# Patient Record
Sex: Male | Born: 1965 | Race: Black or African American | Hispanic: No | Marital: Married | Smoking: Former smoker
Health system: Southern US, Community
[De-identification: ages and names within clinical notes are randomized; demographics above are authoritative.]

## PROBLEM LIST (undated history)

## (undated) DIAGNOSIS — M549 Dorsalgia, unspecified: Secondary | ICD-10-CM

## (undated) DIAGNOSIS — R112 Nausea with vomiting, unspecified: Secondary | ICD-10-CM

## (undated) DIAGNOSIS — A09 Infectious gastroenteritis and colitis, unspecified: Secondary | ICD-10-CM

## (undated) DIAGNOSIS — B20 Human immunodeficiency virus [HIV] disease: Secondary | ICD-10-CM

## (undated) DIAGNOSIS — R197 Diarrhea, unspecified: Secondary | ICD-10-CM

## (undated) DIAGNOSIS — K648 Other hemorrhoids: Secondary | ICD-10-CM

## (undated) DIAGNOSIS — N3010 Interstitial cystitis (chronic) without hematuria: Secondary | ICD-10-CM

## (undated) DIAGNOSIS — K219 Gastro-esophageal reflux disease without esophagitis: Secondary | ICD-10-CM

## (undated) DIAGNOSIS — K589 Irritable bowel syndrome without diarrhea: Secondary | ICD-10-CM

## (undated) DIAGNOSIS — I700 Atherosclerosis of aorta: Secondary | ICD-10-CM

## (undated) DIAGNOSIS — K922 Gastrointestinal hemorrhage, unspecified: Secondary | ICD-10-CM

## (undated) DIAGNOSIS — K5900 Constipation, unspecified: Secondary | ICD-10-CM

## (undated) DIAGNOSIS — K8020 Calculus of gallbladder without cholecystitis without obstruction: Secondary | ICD-10-CM

## (undated) DIAGNOSIS — K5790 Diverticulosis of intestine, part unspecified, without perforation or abscess without bleeding: Secondary | ICD-10-CM

## (undated) DIAGNOSIS — M8580 Other specified disorders of bone density and structure, unspecified site: Secondary | ICD-10-CM

## (undated) DIAGNOSIS — M1990 Unspecified osteoarthritis, unspecified site: Secondary | ICD-10-CM

## (undated) DIAGNOSIS — A0471 Enterocolitis due to Clostridium difficile, recurrent: Secondary | ICD-10-CM

## (undated) DIAGNOSIS — F32A Depression, unspecified: Secondary | ICD-10-CM

## (undated) DIAGNOSIS — I10 Essential (primary) hypertension: Secondary | ICD-10-CM

## (undated) DIAGNOSIS — L989 Disorder of the skin and subcutaneous tissue, unspecified: Secondary | ICD-10-CM

## (undated) DIAGNOSIS — R109 Unspecified abdominal pain: Secondary | ICD-10-CM

## (undated) DIAGNOSIS — K31819 Angiodysplasia of stomach and duodenum without bleeding: Secondary | ICD-10-CM

## (undated) DIAGNOSIS — R634 Abnormal weight loss: Secondary | ICD-10-CM

## (undated) DIAGNOSIS — F329 Major depressive disorder, single episode, unspecified: Secondary | ICD-10-CM

## (undated) DIAGNOSIS — I639 Cerebral infarction, unspecified: Secondary | ICD-10-CM

## (undated) DIAGNOSIS — Z21 Asymptomatic human immunodeficiency virus [HIV] infection status: Secondary | ICD-10-CM

## (undated) DIAGNOSIS — K859 Acute pancreatitis without necrosis or infection, unspecified: Secondary | ICD-10-CM

## (undated) DIAGNOSIS — D649 Anemia, unspecified: Secondary | ICD-10-CM

## (undated) DIAGNOSIS — Z952 Presence of prosthetic heart valve: Secondary | ICD-10-CM

## (undated) HISTORY — DX: Infectious gastroenteritis and colitis, unspecified: A09

## (undated) HISTORY — DX: Nausea with vomiting, unspecified: R11.2

## (undated) HISTORY — DX: Disorder of the skin and subcutaneous tissue, unspecified: L98.9

## (undated) HISTORY — DX: Unspecified abdominal pain: R10.9

## (undated) HISTORY — DX: Angiodysplasia of stomach and duodenum without bleeding: K31.819

## (undated) HISTORY — PX: AORTIC VALVE REPLACEMENT: SHX41

## (undated) HISTORY — DX: Diarrhea, unspecified: R19.7

## (undated) HISTORY — DX: Other hemorrhoids: K64.8

## (undated) HISTORY — DX: Enterocolitis due to Clostridium difficile, recurrent: A04.71

## (undated) HISTORY — DX: Interstitial cystitis (chronic) without hematuria: N30.10

## (undated) HISTORY — DX: Atherosclerosis of aorta: I70.0

## (undated) HISTORY — DX: Calculus of gallbladder without cholecystitis without obstruction: K80.20

## (undated) HISTORY — PX: COLONOSCOPY WITH ESOPHAGOGASTRODUODENOSCOPY (EGD): SHX5779

## (undated) HISTORY — PX: KNEE SURGERY: SHX244

## (undated) HISTORY — DX: Gastrointestinal hemorrhage, unspecified: K92.2

## (undated) HISTORY — DX: Abnormal weight loss: R63.4

## (undated) HISTORY — PX: CARDIAC SURGERY: SHX584

## (undated) HISTORY — DX: Irritable bowel syndrome, unspecified: K58.9

## (undated) HISTORY — DX: Anemia, unspecified: D64.9

## (undated) HISTORY — DX: Other specified disorders of bone density and structure, unspecified site: M85.80

## (undated) HISTORY — DX: Constipation, unspecified: K59.00

## (undated) HISTORY — DX: Gastro-esophageal reflux disease without esophagitis: K21.9

## (undated) HISTORY — DX: Diverticulosis of intestine, part unspecified, without perforation or abscess without bleeding: K57.90

## (undated) HISTORY — DX: Dorsalgia, unspecified: M54.9

---

## 2010-09-28 ENCOUNTER — Emergency Department (HOSPITAL_COMMUNITY): Payer: Medicaid Other

## 2010-09-28 ENCOUNTER — Emergency Department (HOSPITAL_COMMUNITY)
Admission: EM | Admit: 2010-09-28 | Discharge: 2010-09-28 | Disposition: A | Discharge status: Home / Self Care | Payer: Medicaid Other | Attending: Emergency Medicine | Admitting: Emergency Medicine

## 2010-09-28 DIAGNOSIS — Z21 Asymptomatic human immunodeficiency virus [HIV] infection status: Secondary | ICD-10-CM | POA: Insufficient documentation

## 2010-09-28 DIAGNOSIS — Z954 Presence of other heart-valve replacement: Secondary | ICD-10-CM | POA: Insufficient documentation

## 2010-09-28 DIAGNOSIS — R112 Nausea with vomiting, unspecified: Secondary | ICD-10-CM | POA: Insufficient documentation

## 2010-09-28 DIAGNOSIS — R109 Unspecified abdominal pain: Secondary | ICD-10-CM | POA: Insufficient documentation

## 2010-09-28 LAB — LIPASE, BLOOD: Lipase: 19 U/L (ref 11–59)

## 2010-09-28 LAB — COMPREHENSIVE METABOLIC PANEL
BUN: 8 mg/dL (ref 6–23)
CO2: 26 mEq/L (ref 19–32)
Calcium: 9.4 mg/dL (ref 8.4–10.5)
Chloride: 104 mEq/L (ref 96–112)
Creatinine, Ser: 1.01 mg/dL (ref 0.50–1.35)
GFR calc Af Amer: >60 mL/min (ref >60)
GFR calc non Af Amer: >60 mL/min (ref >60)
Glucose, Bld: 148 mg/dL — ABNORMAL HIGH (ref 70–99)
Total Bilirubin: 0.4 mg/dL (ref 0.3–1.2)

## 2010-11-10 ENCOUNTER — Emergency Department (HOSPITAL_COMMUNITY): Payer: Medicaid Other

## 2010-11-10 ENCOUNTER — Emergency Department (HOSPITAL_COMMUNITY)
Admission: EM | Admit: 2010-11-10 | Discharge: 2010-11-11 | Disposition: A | Discharge status: Home / Self Care | Payer: Medicaid Other | Attending: Emergency Medicine | Admitting: Emergency Medicine

## 2010-11-10 DIAGNOSIS — Z8673 Personal history of transient ischemic attack (TIA), and cerebral infarction without residual deficits: Secondary | ICD-10-CM | POA: Insufficient documentation

## 2010-11-10 DIAGNOSIS — Z21 Asymptomatic human immunodeficiency virus [HIV] infection status: Secondary | ICD-10-CM | POA: Insufficient documentation

## 2010-11-10 DIAGNOSIS — R10819 Abdominal tenderness, unspecified site: Secondary | ICD-10-CM | POA: Insufficient documentation

## 2010-11-10 DIAGNOSIS — R109 Unspecified abdominal pain: Secondary | ICD-10-CM | POA: Insufficient documentation

## 2010-11-10 DIAGNOSIS — R112 Nausea with vomiting, unspecified: Secondary | ICD-10-CM | POA: Insufficient documentation

## 2010-11-10 DIAGNOSIS — R197 Diarrhea, unspecified: Secondary | ICD-10-CM | POA: Insufficient documentation

## 2010-11-10 LAB — COMPREHENSIVE METABOLIC PANEL
ALT: 23 U/L (ref 0–53)
AST: 28 U/L (ref 0–37)
Alkaline Phosphatase: 94 U/L (ref 39–117)
CO2: 22 mEq/L (ref 19–32)
Calcium: 8.4 mg/dL (ref 8.4–10.5)
GFR calc non Af Amer: >60 mL/min (ref >60)
Potassium: 3.7 mEq/L (ref 3.5–5.1)
Sodium: 141 mEq/L (ref 135–145)
Total Protein: 7 g/dL (ref 6.0–8.3)

## 2010-11-10 LAB — CBC
MCH: 33.1 pg (ref 26.0–34.0)
Platelets: 138 10*3/uL — ABNORMAL LOW (ref 150–400)
RBC: 4.14 MIL/uL — ABNORMAL LOW (ref 4.22–5.81)

## 2010-11-10 LAB — DIFFERENTIAL
Basophils Absolute: 0 10*3/uL (ref 0.0–0.1)
Basophils Relative: 0 % (ref 0–1)
Eosinophils Absolute: 0 10*3/uL (ref 0.0–0.7)
Monocytes Relative: 6 % (ref 3–12)
Neutro Abs: 3.3 10*3/uL (ref 1.7–7.7)
Neutrophils Relative %: 82 % — ABNORMAL HIGH (ref 43–77)

## 2010-11-11 LAB — URINALYSIS, ROUTINE W REFLEX MICROSCOPIC
Glucose, UA: NEGATIVE mg/dL
Hgb urine dipstick: NEGATIVE
Specific Gravity, Urine: 1.02 (ref 1.005–1.030)

## 2011-04-11 ENCOUNTER — Emergency Department (HOSPITAL_COMMUNITY)
Admission: EM | Admit: 2011-04-11 | Discharge: 2011-04-11 | Disposition: A | Discharge status: Home / Self Care | Payer: Medicaid Other | Attending: Emergency Medicine | Admitting: Emergency Medicine

## 2011-04-11 ENCOUNTER — Encounter (HOSPITAL_COMMUNITY): Payer: Self-pay | Admitting: Emergency Medicine

## 2011-04-11 DIAGNOSIS — Z21 Asymptomatic human immunodeficiency virus [HIV] infection status: Secondary | ICD-10-CM | POA: Insufficient documentation

## 2011-04-11 DIAGNOSIS — F17200 Nicotine dependence, unspecified, uncomplicated: Secondary | ICD-10-CM | POA: Insufficient documentation

## 2011-04-11 DIAGNOSIS — R112 Nausea with vomiting, unspecified: Secondary | ICD-10-CM | POA: Insufficient documentation

## 2011-04-11 DIAGNOSIS — Z8673 Personal history of transient ischemic attack (TIA), and cerebral infarction without residual deficits: Secondary | ICD-10-CM | POA: Insufficient documentation

## 2011-04-11 DIAGNOSIS — R197 Diarrhea, unspecified: Secondary | ICD-10-CM | POA: Insufficient documentation

## 2011-04-11 DIAGNOSIS — R1013 Epigastric pain: Secondary | ICD-10-CM | POA: Insufficient documentation

## 2011-04-11 DIAGNOSIS — R10816 Epigastric abdominal tenderness: Secondary | ICD-10-CM | POA: Insufficient documentation

## 2011-04-11 DIAGNOSIS — I10 Essential (primary) hypertension: Secondary | ICD-10-CM | POA: Insufficient documentation

## 2011-04-11 HISTORY — DX: Cerebral infarction, unspecified: I63.9

## 2011-04-11 HISTORY — DX: Essential (primary) hypertension: I10

## 2011-04-11 HISTORY — DX: Asymptomatic human immunodeficiency virus (hiv) infection status: Z21

## 2011-04-11 HISTORY — DX: Human immunodeficiency virus (HIV) disease: B20

## 2011-04-11 LAB — URINALYSIS, ROUTINE W REFLEX MICROSCOPIC
Hgb urine dipstick: NEGATIVE
Protein, ur: NEGATIVE mg/dL
Urobilinogen, UA: 0.2 mg/dL (ref 0.0–1.0)

## 2011-04-11 LAB — COMPREHENSIVE METABOLIC PANEL
ALT: 33 U/L (ref 0–53)
CO2: 24 mEq/L (ref 19–32)
Calcium: 10 mg/dL (ref 8.4–10.5)
Creatinine, Ser: 0.93 mg/dL (ref 0.50–1.35)
GFR calc Af Amer: >90 mL/min (ref >90)
GFR calc non Af Amer: >90 mL/min (ref >90)
Glucose, Bld: 175 mg/dL — ABNORMAL HIGH (ref 70–99)

## 2011-04-11 LAB — DIFFERENTIAL
Eosinophils Relative: 0 % (ref 0–5)
Lymphocytes Relative: 23 % (ref 12–46)
Lymphs Abs: 0.9 10*3/uL (ref 0.7–4.0)
Monocytes Absolute: 0.2 10*3/uL (ref 0.1–1.0)
Monocytes Relative: 6 % (ref 3–12)

## 2011-04-11 LAB — CBC
HCT: 43.1 % (ref 39.0–52.0)
MCV: 92.3 fL (ref 78.0–100.0)
RBC: 4.67 MIL/uL (ref 4.22–5.81)
WBC: 3.7 10*3/uL — ABNORMAL LOW (ref 4.0–10.5)

## 2011-04-11 LAB — PROTIME-INR: Prothrombin Time: 21 seconds — ABNORMAL HIGH (ref 11.6–15.2)

## 2011-04-11 MED ORDER — SODIUM CHLORIDE 0.9 % IV BOLUS (SEPSIS)
1000.0000 mL | Freq: Once | INTRAVENOUS | Status: AC
  Administered 2011-04-11: 1000 mL via INTRAVENOUS

## 2011-04-11 MED ORDER — HYDROMORPHONE HCL PF 1 MG/ML IJ SOLN
1.0000 mg | Freq: Once | INTRAMUSCULAR | Status: AC
  Administered 2011-04-11: 1 mg via INTRAVENOUS
  Filled 2011-04-11: qty 1

## 2011-04-11 MED ORDER — ONDANSETRON HCL 4 MG/2ML IJ SOLN
4.0000 mg | Freq: Once | INTRAMUSCULAR | Status: AC
  Administered 2011-04-11: 4 mg via INTRAVENOUS
  Filled 2011-04-11: qty 2

## 2011-04-11 NOTE — ED Provider Notes (Signed)
Pt with chronic abdominal pain issues.  States he has had this off and on for 10 years.  He is seen regularly at [facility].  Pt without signs of acute abnormality on his lab tests today.  No sign of significant dehydration.  Pt feeling better after treatment.  Doubt pancreatitis, biliary etiology, appendicitis, bowel obstruction.  I saw and evaluated the patient, reviewed the resident's note and I agree with the findings and plan.   Celene Kras, MD 04/11/11 [phone]

## 2011-04-11 NOTE — ED Notes (Signed)
Pt c/o of rt eye swelling, no swelling noted. PA made aware

## 2011-04-11 NOTE — ED Notes (Signed)
Asked pt for urine sample and pt stated that he did not have to go right now. Urinal and visitor at bedside, call bell within reach.

## 2011-04-11 NOTE — ED Provider Notes (Signed)
1:55 PM Patient is in CDU holding for symptomatic improvement of abdominal pain, N/V/D.  Patient reports he is starting to improve, pain is now 6/10.  On exam, pt is A&Ox4, NAD, RRR, mechanical valve, CTAB, abd nondistended, soft, diffusely tender, voluntary guarding, no rebound.  Patient requests that we check PT/INR as he missed his appointment last week and is s/p stroke and valve replacement.  I have discussed with with Dr Roselyn Bering who agrees with this.  Will continue to follow.    2:47 PM Patient reports his pain is now 4/10.  States he would like to try to drink something but also requests more nausea medication.  I discussed his INR result with him (1.7) - patient states he has instructions already on how to adjust his coumadin dose.  I have ordered zofran and PO trial.   UA also pending.  Patient discussed with Felicie Morn, NP, who assumes care of patient at change of shift.    Brandon [name], [place] 04/11/11 [phone]

## 2011-04-11 NOTE — ED Provider Notes (Signed)
History     CSN: [id number]  Arrival date & time 04/11/11  1012   First MD Initiated Contact with Patient 04/11/11 1015      Chief Complaint  Patient presents with  . Emesis    (Consider location/radiation/quality/duration/timing/severity/associated sxs/prior treatment) Patient is a 46 y.o. male presenting with vomiting and abdominal pain.  Emesis  This is a new problem. The current episode started 6 to 12 hours ago. Episode frequency: multiple times. The problem has not changed since onset.The emesis has an appearance of stomach contents. There has been no fever. Associated symptoms include abdominal pain and diarrhea (at baseline). Pertinent negatives include no cough and no fever.  Abdominal Pain The primary symptoms of the illness include abdominal pain, vomiting and diarrhea (at baseline). The primary symptoms of the illness do not include fever, shortness of breath or nausea. The current episode started 6 to 12 hours ago. The onset of the illness was gradual. The problem has not changed since onset. The abdominal pain is located in the epigastric region (squeezing pain). The severity of the abdominal pain is 10/10. The abdominal pain is relieved by nothing. The abdominal pain is exacerbated by movement and certain positions.    Past Medical History  Diagnosis Date  . HIV (human immunodeficiency virus infection)   . Hypertension   . Stroke     Past Surgical History  Procedure Date  . Cardiac surgery     History reviewed. No pertinent family history.  History  Substance Use Topics  . Smoking status: Current Everyday Smoker  . Smokeless tobacco: Not on file  . Alcohol Use: Yes     12oz beer/day      Review of Systems  Constitutional: Negative for fever.  HENT: Negative for congestion, facial swelling and trouble swallowing.   Respiratory: Negative for cough and shortness of breath.   Cardiovascular: Negative for chest pain.  Gastrointestinal: Positive for  vomiting, abdominal pain and diarrhea (at baseline). Negative for nausea.  Genitourinary: Negative for difficulty urinating.  Skin: Negative for rash.  All other systems reviewed and are negative.    Allergies  Sulfa antibiotics and Truvada  Home Medications  No current outpatient prescriptions on file.  BP 154/102  Temp 97.3 F (36.3 C)  Resp 14  SpO2 98%  Physical Exam  Nursing note and vitals reviewed. Constitutional: He is oriented to person, place, and time. He appears well-developed and well-nourished. No distress.  HENT:  Head: Normocephalic and atraumatic.  Mouth/Throat: Oropharynx is clear and moist.  Eyes: Conjunctivae are normal. Pupils are equal, round, and reactive to light. No scleral icterus.  Neck: Normal range of motion. Neck supple.  Cardiovascular: Normal rate, regular rhythm and intact distal pulses.   No murmur heard.      Mechanical click   Pulmonary/Chest: Effort normal and breath sounds normal. No stridor. No respiratory distress. He has no wheezes. He has no rales.  Abdominal: Soft. He exhibits no distension. There is tenderness in the epigastric area. There is guarding (voluntary upon palpation of epigastrum).  Musculoskeletal: Normal range of motion. He exhibits no edema.  Neurological: He is alert and oriented to person, place, and time.  Skin: Skin is warm and dry. No rash noted.  Psychiatric: He has a normal mood and affect. His behavior is normal.    ED Course  Procedures (including critical care time)  Labs Reviewed  CBC - Abnormal; Notable for the following:    WBC 3.7 (*)    Platelets 128 (*)  All other components within normal limits  COMPREHENSIVE METABOLIC PANEL - Abnormal; Notable for the following:    Glucose, Bld 175 (*)    AST 43 (*)    Alkaline Phosphatase 152 (*)    All other components within normal limits  DIFFERENTIAL  LIPASE, BLOOD  URINALYSIS, ROUTINE W REFLEX MICROSCOPIC   No results found.   1. Abdominal  pain       MDM  46 yo male with onset of epigastric pain and vomiting starting at 3 am this morning.  Reported history of pancreatitis.  No fevers.  Tenderness in epigastrum, but otherwise abdomen soft and nontender.  States he has had presentations similar to this at [facility] in [place].  Do not think he needs abdominal imaging.    Labwork unremarkable, Lipase normal.  Pain and nausea improved after Dilaudid/Zofran IV.  1L IVF given.  Patient requested second L.  Transferred to CDU for additional fluids.   Anticipate DC home after additional fluids.          Warnell Forester, MD 04/11/11 1357

## 2011-04-11 NOTE — ED Provider Notes (Signed)
Patient feeling better after IV fluids and medications.  He is scheduled for an appointment with his PCP on 04/27/11.  Patient has adequate pain and antinausea medication at home.  Jimmye Norman, NP 04/11/11 1728

## 2011-04-11 NOTE — ED Notes (Signed)
Pt given ice chips per request

## 2011-04-11 NOTE — ED Notes (Signed)
Pt with nausea, vomiting and squeezing stomach pains since 0300.

## 2011-04-11 NOTE — ED Notes (Signed)
Pt given ICE chips and gingerale

## 2011-04-12 ENCOUNTER — Encounter (HOSPITAL_COMMUNITY): Payer: Self-pay | Admitting: Emergency Medicine

## 2011-04-12 ENCOUNTER — Other Ambulatory Visit: Payer: Self-pay

## 2011-04-12 ENCOUNTER — Emergency Department (HOSPITAL_COMMUNITY): Payer: Medicaid Other

## 2011-04-12 ENCOUNTER — Emergency Department (HOSPITAL_COMMUNITY)
Admission: EM | Admit: 2011-04-12 | Discharge: 2011-04-13 | Disposition: A | Discharge status: Home / Self Care | Payer: Medicaid Other | Attending: Emergency Medicine | Admitting: Emergency Medicine

## 2011-04-12 DIAGNOSIS — R109 Unspecified abdominal pain: Secondary | ICD-10-CM | POA: Insufficient documentation

## 2011-04-12 DIAGNOSIS — Z79899 Other long term (current) drug therapy: Secondary | ICD-10-CM | POA: Insufficient documentation

## 2011-04-12 DIAGNOSIS — R12 Heartburn: Secondary | ICD-10-CM | POA: Insufficient documentation

## 2011-04-12 DIAGNOSIS — M549 Dorsalgia, unspecified: Secondary | ICD-10-CM | POA: Insufficient documentation

## 2011-04-12 DIAGNOSIS — I10 Essential (primary) hypertension: Secondary | ICD-10-CM | POA: Insufficient documentation

## 2011-04-12 DIAGNOSIS — R112 Nausea with vomiting, unspecified: Secondary | ICD-10-CM | POA: Insufficient documentation

## 2011-04-12 DIAGNOSIS — Z9889 Other specified postprocedural states: Secondary | ICD-10-CM | POA: Insufficient documentation

## 2011-04-12 DIAGNOSIS — Z8673 Personal history of transient ischemic attack (TIA), and cerebral infarction without residual deficits: Secondary | ICD-10-CM | POA: Insufficient documentation

## 2011-04-12 DIAGNOSIS — R10819 Abdominal tenderness, unspecified site: Secondary | ICD-10-CM | POA: Insufficient documentation

## 2011-04-12 DIAGNOSIS — Z7901 Long term (current) use of anticoagulants: Secondary | ICD-10-CM | POA: Insufficient documentation

## 2011-04-12 DIAGNOSIS — R197 Diarrhea, unspecified: Secondary | ICD-10-CM | POA: Insufficient documentation

## 2011-04-12 DIAGNOSIS — Z21 Asymptomatic human immunodeficiency virus [HIV] infection status: Secondary | ICD-10-CM | POA: Insufficient documentation

## 2011-04-12 DIAGNOSIS — F17200 Nicotine dependence, unspecified, uncomplicated: Secondary | ICD-10-CM | POA: Insufficient documentation

## 2011-04-12 HISTORY — DX: Acute pancreatitis without necrosis or infection, unspecified: K85.90

## 2011-04-12 LAB — COMPREHENSIVE METABOLIC PANEL
ALT: 24 U/L (ref 0–53)
Alkaline Phosphatase: 137 U/L — ABNORMAL HIGH (ref 39–117)
CO2: 25 mEq/L (ref 19–32)
Chloride: 104 mEq/L (ref 96–112)
GFR calc Af Amer: >90 mL/min (ref >90)
GFR calc non Af Amer: >90 mL/min (ref >90)
Glucose, Bld: 134 mg/dL — ABNORMAL HIGH (ref 70–99)
Potassium: 3.8 mEq/L (ref 3.5–5.1)
Sodium: 138 mEq/L (ref 135–145)
Total Bilirubin: 0.6 mg/dL (ref 0.3–1.2)
Total Protein: 7.8 g/dL (ref 6.0–8.3)

## 2011-04-12 LAB — DIFFERENTIAL
Lymphocytes Relative: 20 % (ref 12–46)
Lymphs Abs: 0.7 10*3/uL (ref 0.7–4.0)
Monocytes Relative: 5 % (ref 3–12)
Neutro Abs: 2.7 10*3/uL (ref 1.7–7.7)
Neutrophils Relative %: 75 % (ref 43–77)

## 2011-04-12 LAB — CBC
Hemoglobin: 14.4 g/dL (ref 13.0–17.0)
Platelets: 130 10*3/uL — ABNORMAL LOW (ref 150–400)
RBC: 4.48 MIL/uL (ref 4.22–5.81)
WBC: 3.6 10*3/uL — ABNORMAL LOW (ref 4.0–10.5)

## 2011-04-12 MED ORDER — ONDANSETRON HCL 4 MG/2ML IJ SOLN
4.0000 mg | Freq: Once | INTRAMUSCULAR | Status: AC
  Administered 2011-04-12: 4 mg via INTRAVENOUS
  Filled 2011-04-12: qty 2

## 2011-04-12 MED ORDER — HYDROMORPHONE HCL PF 1 MG/ML IJ SOLN
1.0000 mg | Freq: Once | INTRAMUSCULAR | Status: AC
  Administered 2011-04-13: 1 mg via INTRAVENOUS
  Filled 2011-04-12: qty 1

## 2011-04-12 MED ORDER — IOHEXOL 300 MG/ML  SOLN: 20.0000 mL | INTRAMUSCULAR | Status: AC

## 2011-04-12 MED ORDER — HYDROMORPHONE HCL PF 1 MG/ML IJ SOLN
1.0000 mg | Freq: Once | INTRAMUSCULAR | Status: AC
  Administered 2011-04-12: 1 mg via INTRAVENOUS
  Filled 2011-04-12: qty 1

## 2011-04-12 MED ORDER — SODIUM CHLORIDE 0.9 % IV BOLUS (SEPSIS)
1000.0000 mL | Freq: Once | INTRAVENOUS | Status: AC
  Administered 2011-04-12: 1000 mL via INTRAVENOUS

## 2011-04-12 NOTE — ED Provider Notes (Signed)
History     CSN: [id number]  Arrival date & time 04/12/11  4098   First MD Initiated Contact with Patient 04/12/11 2008      Chief Complaint  Patient presents with  . Abdominal Pain    (Consider location/radiation/quality/duration/timing/severity/associated sxs/prior treatment) Patient is a 46 y.o. male presenting with abdominal pain.  Abdominal Pain The primary symptoms of the illness include abdominal pain, nausea, vomiting and diarrhea. The primary symptoms of the illness do not include fever or shortness of breath. Episode onset: yesterday. The onset of the illness was gradual. Progression since onset: improved after treatment yesterday, recurred today.  The abdominal pain began yesterday. The pain came on gradually. The abdominal pain has been gradually worsening since its onset. The abdominal pain is located in the epigastric region and periumbilical region. The abdominal pain radiates to the back. The severity of the abdominal pain is 10/10. Relieved by: IV narcotics and Zofran. The abdominal pain is exacerbated by vomiting (coffee).  The patient has had a change in bowel habit (has had diarrhea and hard stools today). Additional symptoms associated with the illness include heartburn. Significant associated medical issues include HIV.    Past Medical History  Diagnosis Date  . HIV (human immunodeficiency virus infection)   . Hypertension   . Stroke   . Pancreatitis     Past Surgical History  Procedure Date  . Cardiac surgery     No family history on file.  History  Substance Use Topics  . Smoking status: Current Everyday Smoker  . Smokeless tobacco: Not on file  . Alcohol Use: Yes     12oz beer/day      Review of Systems  Constitutional: Negative for fever.  HENT: Negative for congestion, facial swelling and trouble swallowing.   Respiratory: Negative for cough and shortness of breath.   Cardiovascular: Negative for chest pain.  Gastrointestinal: Positive for  heartburn, nausea, vomiting, abdominal pain and diarrhea.  Genitourinary: Negative for difficulty urinating.  All other systems reviewed and are negative.    Allergies  Bee venom; Sulfa antibiotics; and Truvada  Home Medications   Current Outpatient Rx  Name Route Sig Dispense Refill  . DAPSONE 100 MG PO TABS Oral Take 100 mg by mouth daily.    Marland Kitchen PREZISTA PO Oral Take 1 tablet by mouth daily.    Marland Kitchen EPIPEN IJ Injection Inject 1 application as directed once as needed. For sever allergic reaction    . DIFLUCAN PO Oral Take 1 tablet by mouth daily.    Marland Kitchen VALACYCLOVIR HCL 500 MG PO TABS Oral Take 500 mg by mouth daily.    . WARFARIN SODIUM 1 MG PO TABS Oral Take 1 mg by mouth daily.    . WARFARIN SODIUM 5 MG PO TABS Oral Take 5 mg by mouth daily.      BP 166/100  Pulse 83  Temp(Src) 98.2 F (36.8 C) (Oral)  Resp 18  SpO2 100%  Physical Exam  Nursing note and vitals reviewed. Constitutional: He is oriented to person, place, and time. He appears well-developed and well-nourished. No distress.  HENT:  Head: Normocephalic and atraumatic.  Mouth/Throat: Oropharynx is clear and moist.  Eyes: Conjunctivae are normal. Pupils are equal, round, and reactive to light. No scleral icterus.  Neck: Normal range of motion. Neck supple.  Cardiovascular: Normal rate, regular rhythm, normal heart sounds and intact distal pulses.   No murmur heard.      Mechanical click  Pulmonary/Chest: Effort normal and breath sounds  normal. No stridor. No respiratory distress. He has no wheezes. He has no rales.  Abdominal: Soft. He exhibits no distension. There is tenderness (epigastric and periumbilical). There is guarding (voluntary).  Musculoskeletal: Normal range of motion. He exhibits no edema.  Neurological: He is alert and oriented to person, place, and time.  Skin: Skin is warm and dry. No rash noted.  Psychiatric: He has a normal mood and affect. His behavior is normal.    ED Course  Procedures  (including critical care time)  Labs Reviewed  CBC - Abnormal; Notable for the following:    WBC 3.6 (*)    Platelets 130 (*)    All other components within normal limits  COMPREHENSIVE METABOLIC PANEL - Abnormal; Notable for the following:    Glucose, Bld 134 (*)    BUN 5 (*)    Alkaline Phosphatase 137 (*)    All other components within normal limits  DIFFERENTIAL  LIPASE, BLOOD   Ct Abdomen Pelvis W Contrast  04/13/2011  *RADIOLOGY REPORT*  Clinical Data: Abdominal pain.  CT ABDOMEN AND PELVIS WITH CONTRAST  Technique:  Multidetector CT imaging of the abdomen and pelvis was performed following the standard protocol during bolus administration of intravenous contrast.  Contrast: OMNIPAQUE IOHEXOL 300 MG/ML IV SOLN  Comparison: Radiographs dated 11/10/2010  Findings: There are two small stones in the nondistended gallbladder.  Liver, spleen, pancreas, adrenal glands, and kidneys are normal.  No dilated loops of large or small bowel.  No osseous abnormality.  The appendix appears normal.  IMPRESSION: Cholelithiasis.  Otherwise benign-appearing abdomen.  Original Report Authenticated By: Gwynn Burly, M.D.     1. Abdominal pain       MDM  46 yo male with hx of HIV, chronic abdominal pain presenting with flare up of chronic abdominal pain.  Associated with nausea and vomiting.  Had a loose stool this morning, then a hard stool at noon.  Evaluated yesterday for same pain, improved with dilaudid, zofran, and fluids at that time.  Similar presentation today. Afebrile, vss.  Epigastric tenderness to palpation.  Voluntary guarding.  Remainder of abdomen benign.  Repeated labs which were unchanged from yesterday.  CT performed which showed cholelithiasis without signs of gallbladder distension.  Otherwise unremarkable CT.  Pt has epigastric tenderness, but did not exhibit significant RUQ tenderness.  Alk Phos mildly elevated.  Labwork otherwise unremarkable including normal transaminases  and bilirubin.  He also states this abdominal pain is exactly similar to chronic abdominal pain.  Do not think he needs ultrasound imaging or has acute cholecystitis based on current clinical picture.  He has requested additional fluids which are being given.  Will dc after fluids bolus finishes.  He will follow up with PCP.  Return precautions given.        Warnell Forester, MD 04/13/11 [phone]

## 2011-04-12 NOTE — ED Notes (Signed)
Pt to ED with c/o generalized abd pain with nausea and vomiting.  St's he was here yesterday for same and given IV fluids felt better when discharged.  Pt st's pain started again today after drinking coffee

## 2011-04-12 NOTE — ED Provider Notes (Signed)
Medical screening examination/treatment/procedure(s) were performed by non-physician practitioner and as supervising physician I was immediately available for consultation/collaboration.  Juliet Rude. Rubin Payor, MD 04/12/11 [phone]

## 2011-04-13 MED ORDER — IOHEXOL 300 MG/ML  SOLN
100.0000 mL | Freq: Once | INTRAMUSCULAR | Status: AC | PRN
  Administered 2011-04-13: 100 mL via INTRAVENOUS

## 2011-04-13 MED ORDER — SODIUM CHLORIDE 0.9 % IV BOLUS (SEPSIS)
2000.0000 mL | Freq: Once | INTRAVENOUS | Status: AC
  Administered 2011-04-13: 2000 mL via INTRAVENOUS

## 2011-04-13 NOTE — ED Notes (Signed)
Pt receive 2 liters NS then d/c to home - pt and family member aware of same

## 2011-04-13 NOTE — ED Notes (Signed)
Sitting upright on stretcher with family at bedside; no complaints at this time; awaiting re-eval by MD - pt and family member aware of same

## 2011-04-13 NOTE — ED Notes (Signed)
Report given to Cassie, RN

## 2011-04-13 NOTE — ED Notes (Signed)
IV fluids infusing without difficulty.

## 2011-04-14 NOTE — ED Provider Notes (Signed)
I saw and evaluated the patient, reviewed the resident's note and I agree with the findings and plan.  H/o HIV. Pt with mild diffuse ttp. No r/g. Chronic x 12 years. Usually seen at [facility] but patient moved here after recent marriage. Labs generally unremarkable. CT AP without acute findings. IVF, pain control, home.  Forbes Cellar, MD 04/14/11 1112

## 2011-08-18 ENCOUNTER — Emergency Department (HOSPITAL_COMMUNITY)
Admission: EM | Admit: 2011-08-18 | Discharge: 2011-08-18 | Disposition: A | Discharge status: Home / Self Care | Payer: Medicaid Other | Attending: Emergency Medicine | Admitting: Emergency Medicine

## 2011-08-18 ENCOUNTER — Encounter (HOSPITAL_COMMUNITY): Payer: Self-pay | Admitting: *Deleted

## 2011-08-18 DIAGNOSIS — Z79899 Other long term (current) drug therapy: Secondary | ICD-10-CM | POA: Insufficient documentation

## 2011-08-18 DIAGNOSIS — R112 Nausea with vomiting, unspecified: Secondary | ICD-10-CM

## 2011-08-18 DIAGNOSIS — Z8673 Personal history of transient ischemic attack (TIA), and cerebral infarction without residual deficits: Secondary | ICD-10-CM | POA: Insufficient documentation

## 2011-08-18 DIAGNOSIS — I10 Essential (primary) hypertension: Secondary | ICD-10-CM | POA: Insufficient documentation

## 2011-08-18 DIAGNOSIS — Z21 Asymptomatic human immunodeficiency virus [HIV] infection status: Secondary | ICD-10-CM | POA: Insufficient documentation

## 2011-08-18 DIAGNOSIS — R109 Unspecified abdominal pain: Secondary | ICD-10-CM | POA: Insufficient documentation

## 2011-08-18 LAB — DIFFERENTIAL
Basophils Absolute: 0 10*3/uL (ref 0.0–0.1)
Basophils Relative: 0 % (ref 0–1)
Eosinophils Relative: 0 % (ref 0–5)
Monocytes Absolute: 0.3 10*3/uL (ref 0.1–1.0)
Neutro Abs: 3.7 10*3/uL (ref 1.7–7.7)

## 2011-08-18 LAB — PROTIME-INR
INR: 2.2 — ABNORMAL HIGH (ref 0.00–1.49)
Prothrombin Time: 24.8 seconds — ABNORMAL HIGH (ref 11.6–15.2)

## 2011-08-18 LAB — CBC
HCT: 39.9 % (ref 39.0–52.0)
MCHC: 35.8 g/dL (ref 30.0–36.0)
MCV: 92.1 fL (ref 78.0–100.0)
RDW: 12.6 % (ref 11.5–15.5)

## 2011-08-18 LAB — COMPREHENSIVE METABOLIC PANEL
AST: 41 U/L — ABNORMAL HIGH (ref 0–37)
Albumin: 4 g/dL (ref 3.5–5.2)
Calcium: 9 mg/dL (ref 8.4–10.5)
Creatinine, Ser: 1.02 mg/dL (ref 0.50–1.35)
Total Protein: 7.8 g/dL (ref 6.0–8.3)

## 2011-08-18 MED ORDER — PANTOPRAZOLE SODIUM 40 MG IV SOLR
40.0000 mg | Freq: Once | INTRAVENOUS | Status: AC
  Administered 2011-08-18: 40 mg via INTRAVENOUS
  Filled 2011-08-18: qty 40

## 2011-08-18 MED ORDER — SODIUM CHLORIDE 0.9 % IV SOLN: Freq: Once | INTRAVENOUS | Status: DC

## 2011-08-18 MED ORDER — ONDANSETRON 4 MG PO TBDP
8.0000 mg | ORAL_TABLET | Freq: Once | ORAL | Status: AC
  Administered 2011-08-18: 8 mg via ORAL

## 2011-08-18 MED ORDER — PROMETHAZINE HCL 25 MG PO TABS: 25.0000 mg | ORAL_TABLET | Freq: Four times a day (QID) | ORAL | Status: DC | PRN

## 2011-08-18 MED ORDER — ONDANSETRON 4 MG PO TBDP
ORAL_TABLET | ORAL | Status: AC
  Filled 2011-08-18: qty 2

## 2011-08-18 MED ORDER — SODIUM CHLORIDE 0.9 % IV BOLUS (SEPSIS): 1000.0000 mL | Freq: Once | INTRAVENOUS | Status: DC

## 2011-08-18 MED ORDER — HYDROMORPHONE HCL PF 1 MG/ML IJ SOLN
1.0000 mg | Freq: Once | INTRAMUSCULAR | Status: AC
  Administered 2011-08-18: 1 mg via INTRAVENOUS
  Filled 2011-08-18: qty 1

## 2011-08-18 MED ORDER — SODIUM CHLORIDE 0.9 % IV BOLUS (SEPSIS)
1000.0000 mL | Freq: Once | INTRAVENOUS | Status: AC
  Administered 2011-08-18: 1000 mL via INTRAVENOUS

## 2011-08-18 MED ORDER — OXYCODONE-ACETAMINOPHEN 5-325 MG PO TABS
2.0000 | ORAL_TABLET | Freq: Once | ORAL | Status: AC
  Administered 2011-08-18: 2 via ORAL
  Filled 2011-08-18: qty 2

## 2011-08-18 MED ORDER — OXYCODONE-ACETAMINOPHEN 5-325 MG PO TABS: 2.0000 | ORAL_TABLET | ORAL | Status: AC | PRN

## 2011-08-18 MED ORDER — ONDANSETRON HCL 4 MG/2ML IJ SOLN
4.0000 mg | Freq: Once | INTRAMUSCULAR | Status: AC
  Administered 2011-08-18: 4 mg via INTRAVENOUS
  Filled 2011-08-18: qty 2

## 2011-08-18 NOTE — ED Notes (Signed)
Reassess patients pain after pain medication, pt states pain is a level 6

## 2011-08-18 NOTE — ED Provider Notes (Signed)
History     CSN: [id number]  Arrival date & time 08/18/11  1240   First MD Initiated Contact with Patient 08/18/11 1400      Chief Complaint  Patient presents with  . Emesis    (Consider location/radiation/quality/duration/timing/severity/associated sxs/prior treatment) Patient is a 46 y.o. male presenting with vomiting. The history is provided by the patient.  Emesis    he woke up this morning with diffuse abdominal pain, nausea, vomiting. He has chronic diarrhea and this is unchanged. Abdominal pain is sharp and generalized nothing makes it better nothing makes it worse. Pain is rated at 8/10. He also was having chills. He denies fever or sweats. He has a history of pancreatitis and states his last alcohol consumption was 4 days ago at which time he had 16 ounces of beer. He says he has episodes like this fairly frequently at 10 and at the tends to occur about once every 3 months.  Past Medical History  Diagnosis Date  . HIV (human immunodeficiency virus infection)   . Hypertension   . Stroke   . Pancreatitis     Past Surgical History  Procedure Date  . Cardiac surgery     History reviewed. No pertinent family history.  History  Substance Use Topics  . Smoking status: Current Everyday Smoker  . Smokeless tobacco: Not on file  . Alcohol Use: Yes     12oz beer/day      Review of Systems  Gastrointestinal: Positive for vomiting.  All other systems reviewed and are negative.    Allergies  Bee venom; Sulfa antibiotics; and Truvada  Home Medications   Current Outpatient Rx  Name Route Sig Dispense Refill  . DAPSONE 100 MG PO TABS Oral Take 100 mg by mouth daily.    Marland Kitchen PREZISTA PO Oral Take 1 tablet by mouth daily.    Marland Kitchen EPIPEN IJ Injection Inject 1 application as directed once as needed. For sever allergic reaction    . FOLIC ACID 1 MG PO TABS Oral Take 1 mg by mouth daily.    Marland Kitchen HYDROCHLOROTHIAZIDE 12.5 MG PO CAPS Oral Take 12.5 mg by mouth daily.    Marland Kitchen  MARAVIROC 150 MG PO TABS Oral Take 150 mg by mouth 2 (two) times daily.    Marland Kitchen MIRTAZAPINE 15 MG PO TABS Oral Take 15 mg by mouth at bedtime.    Marland Kitchen ONDANSETRON HCL 4 MG PO TABS Oral Take 4 mg by mouth every 8 (eight) hours as needed. For nausea    . PROCHLORPERAZINE MALEATE 10 MG PO TABS Oral Take 10 mg by mouth every 6 (six) hours as needed. For nausea    . RALTEGRAVIR POTASSIUM 400 MG PO TABS Oral Take 400 mg by mouth 2 (two) times daily.    Marland Kitchen RITONAVIR 100 MG PO CAPS Oral Take 100 mg by mouth 2 (two) times daily.    . TENOFOVIR DISOPROXIL FUMARATE 300 MG PO TABS Oral Take 300 mg by mouth daily.    Marland Kitchen VALACYCLOVIR HCL 500 MG PO TABS Oral Take 500 mg by mouth daily.    Marland Kitchen VITAMIN B-12 1000 MCG PO TABS Oral Take 1,000 mcg by mouth daily.    . WARFARIN SODIUM 1 MG PO TABS Oral Take 1 mg by mouth daily.    . WARFARIN SODIUM 5 MG PO TABS Oral Take 5 mg by mouth daily.    Marland Kitchen ZIDOVUDINE 300 MG PO TABS Oral Take 300 mg by mouth 2 (two) times daily.  BP 161/115  Pulse 89  Temp(Src) 97.9 F (36.6 C) (Oral)  Resp 18  SpO2 100%  Physical Exam  Nursing note and vitals reviewed.  45 year old male who appears uncomfortable and currently having a shaking chill. Vital signs are significant for hypertension with blood pressure 161/115. Oxygen saturation is 100% which is normal. Head is normocephalic and atraumatic. PERRLA, EOMI. There is questionable scleral icterus present. Mucous membranes are slightly dry. Neck is nontender and supple. Back is nontender. Lungs are clear without rales, wheezes, rhonchi. Heart has regular rate rhythm without murmur. Abdomen is soft, flat, with diffuse tenderness. There is no rebound or guarding. Peristalsis is decreased. Extremities have full range of motion, no cyanosis or edema. Skin is warm and dry without rash. Neurologic: Mental status is normal, cranial nerves are intact, there are no motor or sensory deficits.  ED Course  Procedures (including critical care  time)  Results for orders placed during the hospital encounter of 08/18/11  CBC      Component Value Range   WBC 4.8  4.0 - 10.5 (K/uL)   RBC 4.33  4.22 - 5.81 (MIL/uL)   Hemoglobin 14.3  13.0 - 17.0 (g/dL)   HCT 16.1  09.6 - 04.5 (%)   MCV 92.1  78.0 - 100.0 (fL)   MCH 33.0  26.0 - 34.0 (pg)   MCHC 35.8  30.0 - 36.0 (g/dL)   RDW 40.9  81.1 - 91.4 (%)   Platelets 159  150 - 400 (K/uL)  DIFFERENTIAL      Component Value Range   Neutrophils Relative 76  43 - 77 (%)   Neutro Abs 3.7  1.7 - 7.7 (K/uL)   Lymphocytes Relative 18  12 - 46 (%)   Lymphs Abs 0.9  0.7 - 4.0 (K/uL)   Monocytes Relative 6  3 - 12 (%)   Monocytes Absolute 0.3  0.1 - 1.0 (K/uL)   Eosinophils Relative 0  0 - 5 (%)   Eosinophils Absolute 0.0  0.0 - 0.7 (K/uL)   Basophils Relative 0  0 - 1 (%)   Basophils Absolute 0.0  0.0 - 0.1 (K/uL)  COMPREHENSIVE METABOLIC PANEL      Component Value Range   Sodium 136  135 - 145 (mEq/L)   Potassium 5.0  3.5 - 5.1 (mEq/L)   Chloride 101  96 - 112 (mEq/L)   CO2 24  19 - 32 (mEq/L)   Glucose, Bld 159 (*) 70 - 99 (mg/dL)   BUN 8  6 - 23 (mg/dL)   Creatinine, Ser 7.82  0.50 - 1.35 (mg/dL)   Calcium 9.0  8.4 - 95.6 (mg/dL)   Total Protein 7.8  6.0 - 8.3 (g/dL)   Albumin 4.0  3.5 - 5.2 (g/dL)   AST 41 (*) 0 - 37 (U/L)   ALT 14  0 - 53 (U/L)   Alkaline Phosphatase 68  39 - 117 (U/L)   Total Bilirubin 0.5  0.3 - 1.2 (mg/dL)   GFR calc non Af Amer 88 (*) >90 (mL/min)   GFR calc Af Amer >90  >90 (mL/min)  LIPASE, BLOOD      Component Value Range   Lipase 21  11 - 59 (U/L)  PROTIME-INR      Component Value Range   Prothrombin Time 24.8 (*) 11.6 - 15.2 (seconds)   INR 2.20 (*) 0.00 - 1.49      No diagnosis found.    MDM  Recurrent abdominal pain and vomiting which may be  recurrence of pancreatitis. Also consider cyclical vomiting syndrome. Old records are reviewed and he was seen in the ED with a similar presentation in February of this year. Laboratory workup has  been initiated and will be given IV fluids, IV ondansetron, IV pantoprazole, IV hydromorphone and reevaluated.  After 1 L of fluid and one dose of hydromorphone and ondansetron as well as pantoprazole, nausea is improved but pain has now. You'll be given additional fluid and another dose of hydromorphone. He states that he usually is able to be discharged after getting about 3 L of fluid when he has a flareup like this. Case is endorsed to Dr. Manus Gunning.      Dione Booze, MD 08/18/11 [phone]

## 2011-08-18 NOTE — Discharge Instructions (Signed)
Nausea and Vomiting  Nausea is a sick feeling that often comes before throwing up (vomiting). Vomiting is a reflex where stomach contents come out of your mouth. Vomiting can cause severe loss of body fluids (dehydration). Children and elderly adults can become dehydrated quickly, especially if they also have diarrhea. Nausea and vomiting are symptoms of a condition or disease. It is important to find the cause of your symptoms.  CAUSES    Direct irritation of the stomach lining. This irritation can result from increased acid production (gastroesophageal reflux disease), infection, food poisoning, taking certain medicines (such as nonsteroidal anti-inflammatory drugs), alcohol use, or tobacco use.   Signals from the brain.These signals could be caused by a headache, heat exposure, an inner ear disturbance, increased pressure in the brain from injury, infection, a tumor, or a concussion, pain, emotional stimulus, or metabolic problems.   An obstruction in the gastrointestinal tract (bowel obstruction).   Illnesses such as diabetes, hepatitis, gallbladder problems, appendicitis, kidney problems, cancer, sepsis, atypical symptoms of a heart attack, or eating disorders.   Medical treatments such as chemotherapy and radiation.   Receiving medicine that makes you sleep (general anesthetic) during surgery.  DIAGNOSIS  Your caregiver may ask for tests to be done if the problems do not improve after a few days. Tests may also be done if symptoms are severe or if the reason for the nausea and vomiting is not clear. Tests may include:   Urine tests.   Blood tests.   Stool tests.   Cultures (to look for evidence of infection).   X-rays or other imaging studies.  Test results can help your caregiver make decisions about treatment or the need for additional tests.  TREATMENT  You need to stay well hydrated. Drink frequently but in small amounts.You may wish to drink water, sports drinks, clear broth, or eat frozen  ice pops or gelatin dessert to help stay hydrated.When you eat, eating slowly may help prevent nausea.There are also some antinausea medicines that may help prevent nausea.  HOME CARE INSTRUCTIONS    Take all medicine as directed by your caregiver.   If you do not have an appetite, do not force yourself to eat. However, you must continue to drink fluids.   If you have an appetite, eat a normal diet unless your caregiver tells you differently.   Eat a variety of complex carbohydrates (rice, wheat, potatoes, bread), lean meats, yogurt, fruits, and vegetables.   Avoid high-fat foods because they are more difficult to digest.   Drink enough water and fluids to keep your urine clear or pale yellow.   If you are dehydrated, ask your caregiver for specific rehydration instructions. Signs of dehydration may include:   Severe thirst.   Dry lips and mouth.   Dizziness.   Dark urine.   Decreasing urine frequency and amount.   Confusion.   Rapid breathing or pulse.  SEEK IMMEDIATE MEDICAL CARE IF:    You have blood or brown flecks (like coffee grounds) in your vomit.   You have black or bloody stools.   You have a severe headache or stiff neck.   You are confused.   You have severe abdominal pain.   You have chest pain or trouble breathing.   You do not urinate at least once every 8 hours.   You develop cold or clammy skin.   You continue to vomit for longer than 24 to 48 hours.   You have a fever.  MAKE SURE YOU:      Understand these instructions.   Will watch your condition.   Will get help right away if you are not doing well or get worse.  Document Released: 02/25/2005 Document Revised: 02/14/2011 Document Reviewed: 07/25/2010  [employer] Patient Information 2012 [employer].

## 2011-08-18 NOTE — ED Provider Notes (Signed)
I assumed care this patient from Dr. Preston Fleeting at 1600. History of chronic pancreatitis with nausea, vomiting and diarrhea.  Pain control in the ED with medications. No further episodes of vomiting. Patient states she feels ready to go home.  BP 154/95  Pulse 81  Temp(Src) 98.4 F (36.9 C) (Oral)  Resp 20  SpO2 98%   Glynn Octave, MD 08/18/11 1934

## 2011-08-18 NOTE — ED Notes (Signed)
Pt noted to be going to triage bathroom and vomiting x 2.

## 2011-08-18 NOTE — ED Notes (Signed)
Reports waking up this am with n/v/d.

## 2011-08-18 NOTE — ED Notes (Signed)
Offered patient ginger ale for Fluid challenge

## 2011-09-21 ENCOUNTER — Emergency Department (HOSPITAL_COMMUNITY)
Admission: EM | Admit: 2011-09-21 | Discharge: 2011-09-21 | Disposition: A | Discharge status: Home / Self Care | Payer: Medicaid Other | Attending: Emergency Medicine | Admitting: Emergency Medicine

## 2011-09-21 ENCOUNTER — Encounter (HOSPITAL_COMMUNITY): Payer: Self-pay | Admitting: Emergency Medicine

## 2011-09-21 DIAGNOSIS — Z21 Asymptomatic human immunodeficiency virus [HIV] infection status: Secondary | ICD-10-CM | POA: Insufficient documentation

## 2011-09-21 DIAGNOSIS — R112 Nausea with vomiting, unspecified: Secondary | ICD-10-CM

## 2011-09-21 DIAGNOSIS — R109 Unspecified abdominal pain: Secondary | ICD-10-CM

## 2011-09-21 DIAGNOSIS — Z8673 Personal history of transient ischemic attack (TIA), and cerebral infarction without residual deficits: Secondary | ICD-10-CM | POA: Insufficient documentation

## 2011-09-21 DIAGNOSIS — K861 Other chronic pancreatitis: Secondary | ICD-10-CM | POA: Insufficient documentation

## 2011-09-21 DIAGNOSIS — I10 Essential (primary) hypertension: Secondary | ICD-10-CM | POA: Insufficient documentation

## 2011-09-21 DIAGNOSIS — F17200 Nicotine dependence, unspecified, uncomplicated: Secondary | ICD-10-CM | POA: Insufficient documentation

## 2011-09-21 DIAGNOSIS — Z79899 Other long term (current) drug therapy: Secondary | ICD-10-CM | POA: Insufficient documentation

## 2011-09-21 DIAGNOSIS — R197 Diarrhea, unspecified: Secondary | ICD-10-CM | POA: Insufficient documentation

## 2011-09-21 LAB — CBC WITH DIFFERENTIAL/PLATELET
Basophils Absolute: 0 10*3/uL (ref 0.0–0.1)
Basophils Relative: 0 % (ref 0–1)
Eosinophils Absolute: 0.1 10*3/uL (ref 0.0–0.7)
Eosinophils Relative: 1 % (ref 0–5)
HCT: 40.1 % (ref 39.0–52.0)
Hemoglobin: 14.4 g/dL (ref 13.0–17.0)
Lymphocytes Relative: 34 % (ref 12–46)
Lymphs Abs: 1.5 10*3/uL (ref 0.7–4.0)
MCH: 33 pg (ref 26.0–34.0)
MCHC: 35.9 g/dL (ref 30.0–36.0)
MCV: 92 fL (ref 78.0–100.0)
Monocytes Absolute: 0.3 10*3/uL (ref 0.1–1.0)
Monocytes Relative: 7 % (ref 3–12)
Neutro Abs: 2.6 10*3/uL (ref 1.7–7.7)
Neutrophils Relative %: 58 % (ref 43–77)
Platelets: 131 10*3/uL — ABNORMAL LOW (ref 150–400)
RBC: 4.36 MIL/uL (ref 4.22–5.81)
RDW: 12.6 % (ref 11.5–15.5)
WBC: 4.6 10*3/uL (ref 4.0–10.5)

## 2011-09-21 LAB — COMPREHENSIVE METABOLIC PANEL
ALT: 10 U/L (ref 0–53)
AST: 26 U/L (ref 0–37)
Albumin: 4.3 g/dL (ref 3.5–5.2)
Alkaline Phosphatase: 85 U/L (ref 39–117)
BUN: 8 mg/dL (ref 6–23)
CO2: 21 mEq/L (ref 19–32)
Calcium: 9.4 mg/dL (ref 8.4–10.5)
Chloride: 105 mEq/L (ref 96–112)
Creatinine, Ser: 1.1 mg/dL (ref 0.50–1.35)
GFR calc Af Amer: >90 mL/min (ref >90)
GFR calc non Af Amer: 80 mL/min — ABNORMAL LOW (ref >90)
Glucose, Bld: 109 mg/dL — ABNORMAL HIGH (ref 70–99)
Potassium: 4.2 mEq/L (ref 3.5–5.1)
Sodium: 140 mEq/L (ref 135–145)
Total Bilirubin: 0.3 mg/dL (ref 0.3–1.2)
Total Protein: 8 g/dL (ref 6.0–8.3)

## 2011-09-21 LAB — LIPASE, BLOOD: Lipase: 18 U/L (ref 11–59)

## 2011-09-21 MED ORDER — ONDANSETRON HCL 4 MG/2ML IJ SOLN
4.0000 mg | Freq: Once | INTRAMUSCULAR | Status: AC
  Administered 2011-09-21: 4 mg via INTRAVENOUS
  Filled 2011-09-21: qty 2

## 2011-09-21 MED ORDER — MORPHINE SULFATE 4 MG/ML IJ SOLN
4.0000 mg | Freq: Once | INTRAMUSCULAR | Status: AC
  Administered 2011-09-21: 4 mg via INTRAVENOUS
  Filled 2011-09-21: qty 1

## 2011-09-21 MED ORDER — HYDROMORPHONE HCL PF 1 MG/ML IJ SOLN
1.0000 mg | Freq: Once | INTRAMUSCULAR | Status: AC
  Administered 2011-09-21: 1 mg via INTRAVENOUS
  Filled 2011-09-21: qty 1

## 2011-09-21 MED ORDER — PANTOPRAZOLE SODIUM 40 MG IV SOLR
40.0000 mg | Freq: Once | INTRAVENOUS | Status: AC
Start: 2011-09-21 — End: 2011-09-21
  Administered 2011-09-21: 40 mg via INTRAVENOUS
  Filled 2011-09-21: qty 40

## 2011-09-21 MED ORDER — PANTOPRAZOLE SODIUM 20 MG PO TBEC: 40.0000 mg | DELAYED_RELEASE_TABLET | Freq: Every day | ORAL | Status: DC

## 2011-09-21 MED ORDER — SODIUM CHLORIDE 0.9 % IV BOLUS (SEPSIS)
1000.0000 mL | Freq: Once | INTRAVENOUS | Status: AC
  Administered 2011-09-21: 1000 mL via INTRAVENOUS

## 2011-09-21 MED ORDER — SODIUM CHLORIDE 0.9 % IV BOLUS (SEPSIS)
1000.0000 mL | Freq: Once | INTRAVENOUS | Status: AC
  Administered 2011-09-21: 500 mL via INTRAVENOUS

## 2011-09-21 MED ORDER — SODIUM CHLORIDE 0.9 % IV BOLUS (SEPSIS): 1000.0000 mL | Freq: Once | INTRAVENOUS | Status: DC

## 2011-09-21 NOTE — ED Notes (Signed)
Lab in to draw blood work. Unable to obtain at present time.  Pt requested that lab wait until IV fluids are in to try again.  MD aware

## 2011-09-21 NOTE — ED Notes (Signed)
Pt c/o generalized abdominal pain with n/v onset about 1 hour ago today. Last normal BM yesterday.

## 2011-09-21 NOTE — ED Notes (Signed)
According to family, "this the norm for him."

## 2011-09-21 NOTE — ED Provider Notes (Signed)
History     CSN: [id number]  Arrival date & time 09/21/11  1345   First MD Initiated Contact with Patient 09/21/11 1459      Chief Complaint  Patient presents with  . Nausea  . Emesis  . Abdominal Pain    (Consider location/radiation/quality/duration/timing/severity/associated sxs/prior treatment) HPI  46 year old male with history of HIV, and history of chronic pancreatitis presents complaining of abdominal pain.  She reports today he developed diffuse pain in his abdomen while he was sitting. Pain is described as a sharp and throbbing sensation throughout his abdomen with associated nausea, vomiting, and loose stool.  Denies fever but endorse chills. Report sxs felt similar to prior pancreatitis pain. Does not know what contribute to his sxs.  Denies recent alcohol use, or hx of diabetes.  Sts his HIV is not well controlled, and last CD4 counts several months ago was 40.  Denies cp, sob, cough, hemoptysis, back pain or urinary sxs.    Past Medical History  Diagnosis Date  . HIV (human immunodeficiency virus infection)   . Hypertension   . Stroke   . Pancreatitis     Past Surgical History  Procedure Date  . Cardiac surgery     History reviewed. No pertinent family history.  History  Substance Use Topics  . Smoking status: Current Everyday Smoker  . Smokeless tobacco: Not on file  . Alcohol Use: Yes     12oz beer/day      Review of Systems  All other systems reviewed and are negative.    Allergies  Bee venom; Sulfa antibiotics; and Truvada  Home Medications   Current Outpatient Rx  Name Route Sig Dispense Refill  . DAPSONE 100 MG PO TABS Oral Take 100 mg by mouth daily.    Marland Kitchen PREZISTA PO Oral Take 1 tablet by mouth daily.    Marland Kitchen FOLIC ACID 1 MG PO TABS Oral Take 1 mg by mouth daily.    Marland Kitchen HYDROCHLOROTHIAZIDE 12.5 MG PO CAPS Oral Take 12.5 mg by mouth daily.    Marland Kitchen MARAVIROC 150 MG PO TABS Oral Take 150 mg by mouth 2 (two) times daily.    Marland Kitchen MIRTAZAPINE 15 MG PO  TABS Oral Take 15 mg by mouth at bedtime.    Marland Kitchen ONDANSETRON HCL 4 MG PO TABS Oral Take 4 mg by mouth every 8 (eight) hours as needed. For nausea    . PROCHLORPERAZINE MALEATE 10 MG PO TABS Oral Take 10 mg by mouth every 6 (six) hours as needed. For nausea    . PROMETHAZINE HCL 25 MG PO TABS Oral Take 25 mg by mouth every 6 (six) hours as needed. Nausea    . RALTEGRAVIR POTASSIUM 400 MG PO TABS Oral Take 400 mg by mouth 2 (two) times daily.    Marland Kitchen RITONAVIR 100 MG PO CAPS Oral Take 100 mg by mouth 2 (two) times daily.    . TENOFOVIR DISOPROXIL FUMARATE 300 MG PO TABS Oral Take 300 mg by mouth daily.    Marland Kitchen VALACYCLOVIR HCL 500 MG PO TABS Oral Take 500 mg by mouth daily.    Marland Kitchen VITAMIN B-12 1000 MCG PO TABS Oral Take 1,000 mcg by mouth daily.    . WARFARIN SODIUM 1 MG PO TABS Oral Take 1 mg by mouth daily.    . WARFARIN SODIUM 5 MG PO TABS Oral Take 5 mg by mouth daily.    Marland Kitchen ZIDOVUDINE 300 MG PO TABS Oral Take 300 mg by mouth 2 (two) times daily.    Marland Kitchen  EPIPEN IJ Injection Inject 1 application as directed once as needed. For sever allergic reaction      BP 167/91  Pulse 79  Temp 97.4 F (36.3 C) (Oral)  Resp 18  SpO2 100%  Physical Exam  Nursing note and vitals reviewed. Constitutional: He appears well-developed and well-nourished.       Diaphoretic, uncomfortable appearing  HENT:  Head: Normocephalic and atraumatic.  Mouth/Throat: Oropharynx is clear and moist.  Eyes: Conjunctivae are normal. No scleral icterus.  Neck: Neck supple.  Cardiovascular: Normal rate and regular rhythm.   Pulmonary/Chest: Effort normal. No respiratory distress. He has no wheezes.  Abdominal: There is tenderness. There is guarding.       Diffused abdominal tenderness with guarding, no rebound tenderness.  No abdominal distension.  No hernia noted.   Musculoskeletal: He exhibits no edema.  Neurological: He is alert.  Skin: Skin is warm. No rash noted.  Psychiatric: He has a normal mood and affect.    ED Course    Procedures (including critical care time)   Labs Reviewed  CBC WITH DIFFERENTIAL  COMPREHENSIVE METABOLIC PANEL  URINALYSIS, ROUTINE W REFLEX MICROSCOPIC   No results found.   No diagnosis found.  Results for orders placed during the hospital encounter of 09/21/11  CBC WITH DIFFERENTIAL      Component Value Range   WBC 4.6  4.0 - 10.5 K/uL   RBC 4.36  4.22 - 5.81 MIL/uL   Hemoglobin 14.4  13.0 - 17.0 g/dL   HCT 30.1  60.1 - 09.3 %   MCV 92.0  78.0 - 100.0 fL   MCH 33.0  26.0 - 34.0 pg   MCHC 35.9  30.0 - 36.0 g/dL   RDW 23.5  57.3 - 22.0 %   Platelets 131 (*) 150 - 400 K/uL   Neutrophils Relative 58  43 - 77 %   Neutro Abs 2.6  1.7 - 7.7 K/uL   Lymphocytes Relative 34  12 - 46 %   Lymphs Abs 1.5  0.7 - 4.0 K/uL   Monocytes Relative 7  3 - 12 %   Monocytes Absolute 0.3  0.1 - 1.0 K/uL   Eosinophils Relative 1  0 - 5 %   Eosinophils Absolute 0.1  0.0 - 0.7 K/uL   Basophils Relative 0  0 - 1 %   Basophils Absolute 0.0  0.0 - 0.1 K/uL  COMPREHENSIVE METABOLIC PANEL      Component Value Range   Sodium 140  135 - 145 mEq/L   Potassium 4.2  3.5 - 5.1 mEq/L   Chloride 105  96 - 112 mEq/L   CO2 21  19 - 32 mEq/L   Glucose, Bld 109 (*) 70 - 99 mg/dL   BUN 8  6 - 23 mg/dL   Creatinine, Ser 2.54  0.50 - 1.35 mg/dL   Calcium 9.4  8.4 - 27.0 mg/dL   Total Protein 8.0  6.0 - 8.3 g/dL   Albumin 4.3  3.5 - 5.2 g/dL   AST 26  0 - 37 U/L   ALT 10  0 - 53 U/L   Alkaline Phosphatase 85  39 - 117 U/L   Total Bilirubin 0.3  0.3 - 1.2 mg/dL   GFR calc non Af Amer 80 (*) >90 mL/min   GFR calc Af Amer >90  >90 mL/min  LIPASE, BLOOD      Component Value Range   Lipase 18  11 - 59 U/L   No results found.  1. Abdominal pain 2. Nausea, vomiting, diarrhea  MDM  Hx of pancreatitis with diffused abd pain.  Guarding on exam, diaphoretic, immunocompromise.  Work up initiated, will consider abd/pelvic CT scan if labs supports it.    4:09 PM Labs shows no acute changes.  Pt reports  nausea improves but still endorse pain.  Will continue management.  VSS.   Pt refused to have his PT/INR drawn.  No abnormal bleeding at this time.    5:27 PM sxs has improved.  Pt requesting for more IVF and pain control. Agrees to f/u with his PCP at the end of the month.    Fayrene Helper, PA-C 09/21/11 1818

## 2011-09-26 NOTE — ED Provider Notes (Signed)
Medical screening examination/treatment/procedure(s) were performed by non-physician practitioner and as supervising physician I was immediately available for consultation/collaboration.  Raeford Razor, MD 09/26/11 [phone]

## 2011-10-22 ENCOUNTER — Encounter (HOSPITAL_COMMUNITY): Payer: Self-pay | Admitting: *Deleted

## 2011-10-22 ENCOUNTER — Emergency Department (HOSPITAL_COMMUNITY)
Admission: EM | Admit: 2011-10-22 | Discharge: 2011-10-22 | Disposition: A | Discharge status: Home / Self Care | Payer: Medicaid Other | Attending: Emergency Medicine | Admitting: Emergency Medicine

## 2011-10-22 DIAGNOSIS — I10 Essential (primary) hypertension: Secondary | ICD-10-CM | POA: Insufficient documentation

## 2011-10-22 DIAGNOSIS — Z8673 Personal history of transient ischemic attack (TIA), and cerebral infarction without residual deficits: Secondary | ICD-10-CM | POA: Insufficient documentation

## 2011-10-22 DIAGNOSIS — Z21 Asymptomatic human immunodeficiency virus [HIV] infection status: Secondary | ICD-10-CM | POA: Insufficient documentation

## 2011-10-22 DIAGNOSIS — R079 Chest pain, unspecified: Secondary | ICD-10-CM | POA: Insufficient documentation

## 2011-10-22 DIAGNOSIS — Z79899 Other long term (current) drug therapy: Secondary | ICD-10-CM | POA: Insufficient documentation

## 2011-10-22 DIAGNOSIS — F17200 Nicotine dependence, unspecified, uncomplicated: Secondary | ICD-10-CM | POA: Insufficient documentation

## 2011-10-22 DIAGNOSIS — R109 Unspecified abdominal pain: Secondary | ICD-10-CM | POA: Insufficient documentation

## 2011-10-22 LAB — URINALYSIS, ROUTINE W REFLEX MICROSCOPIC
Glucose, UA: NEGATIVE mg/dL
Ketones, ur: 15 mg/dL — AB
Leukocytes, UA: NEGATIVE
Nitrite: NEGATIVE
Protein, ur: 30 mg/dL — AB
Specific Gravity, Urine: 1.023 (ref 1.005–1.030)
Urobilinogen, UA: 0.2 mg/dL (ref 0.0–1.0)
pH: 6 (ref 5.0–8.0)

## 2011-10-22 LAB — URINE MICROSCOPIC-ADD ON

## 2011-10-22 LAB — CBC WITH DIFFERENTIAL/PLATELET
Basophils Absolute: 0 10*3/uL (ref 0.0–0.1)
Basophils Relative: 0 % (ref 0–1)
Eosinophils Absolute: 0 10*3/uL (ref 0.0–0.7)
Eosinophils Relative: 1 % (ref 0–5)
HCT: 40.6 % (ref 39.0–52.0)
Hemoglobin: 14.1 g/dL (ref 13.0–17.0)
Lymphocytes Relative: 30 % (ref 12–46)
Lymphs Abs: 1.3 10*3/uL (ref 0.7–4.0)
MCH: 31.8 pg (ref 26.0–34.0)
MCHC: 34.7 g/dL (ref 30.0–36.0)
MCV: 91.4 fL (ref 78.0–100.0)
Monocytes Absolute: 0.3 10*3/uL (ref 0.1–1.0)
Monocytes Relative: 7 % (ref 3–12)
Neutro Abs: 2.7 10*3/uL (ref 1.7–7.7)
Neutrophils Relative %: 62 % (ref 43–77)
Platelets: 155 10*3/uL (ref 150–400)
RBC: 4.44 MIL/uL (ref 4.22–5.81)
RDW: 12.8 % (ref 11.5–15.5)
WBC: 4.3 10*3/uL (ref 4.0–10.5)

## 2011-10-22 LAB — PROTIME-INR
INR: 2.81 — ABNORMAL HIGH (ref 0.00–1.49)
Prothrombin Time: 30 seconds — ABNORMAL HIGH (ref 11.6–15.2)

## 2011-10-22 LAB — COMPREHENSIVE METABOLIC PANEL
AST: 31 U/L (ref 0–37)
Albumin: 4.2 g/dL (ref 3.5–5.2)
BUN: 8 mg/dL (ref 6–23)
CO2: 26 mEq/L (ref 19–32)
Calcium: 9.4 mg/dL (ref 8.4–10.5)
Chloride: 104 mEq/L (ref 96–112)
Creatinine, Ser: 0.98 mg/dL (ref 0.50–1.35)
GFR calc non Af Amer: >90 mL/min (ref >90)
Total Bilirubin: 0.4 mg/dL (ref 0.3–1.2)

## 2011-10-22 LAB — LIPASE, BLOOD: Lipase: 14 U/L (ref 11–59)

## 2011-10-22 MED ORDER — ONDANSETRON HCL 4 MG/2ML IJ SOLN
4.0000 mg | Freq: Once | INTRAMUSCULAR | Status: AC
  Administered 2011-10-22: 4 mg via INTRAVENOUS
  Filled 2011-10-22: qty 2

## 2011-10-22 MED ORDER — PANTOPRAZOLE SODIUM 40 MG IV SOLR
40.0000 mg | Freq: Once | INTRAVENOUS | Status: AC
  Administered 2011-10-22: 40 mg via INTRAVENOUS
  Filled 2011-10-22: qty 40

## 2011-10-22 MED ORDER — SODIUM CHLORIDE 0.9 % IV BOLUS (SEPSIS)
1000.0000 mL | Freq: Once | INTRAVENOUS | Status: AC
  Administered 2011-10-22: 1000 mL via INTRAVENOUS

## 2011-10-22 MED ORDER — ONDANSETRON HCL 4 MG PO TABS: 4.0000 mg | ORAL_TABLET | Freq: Four times a day (QID) | ORAL | Status: AC

## 2011-10-22 MED ORDER — MORPHINE SULFATE 4 MG/ML IJ SOLN
4.0000 mg | Freq: Once | INTRAMUSCULAR | Status: AC
  Administered 2011-10-22: 4 mg via INTRAVENOUS
  Filled 2011-10-22: qty 1

## 2011-10-22 NOTE — ED Notes (Signed)
Pt is here with mid upper abdominal pain, diaphoretic, vomiting,  And hurts in middle of back.  Pale

## 2011-10-22 NOTE — ED Notes (Signed)
Ambulatory to bathroom with assistance of family.

## 2011-10-22 NOTE — ED Provider Notes (Signed)
History     CSN: [id number]  Arrival date & time 10/22/11  1307   First MD Initiated Contact with Patient 10/22/11 1521      Chief Complaint  Patient presents with  . Chest Pain  . Abdominal Pain    (Consider location/radiation/quality/duration/timing/severity/associated sxs/prior treatment) Patient is a 46 y.o. male presenting with abdominal pain. The history is provided by the patient and the spouse.  Abdominal Pain The primary symptoms of the illness include abdominal pain, nausea and vomiting. The primary symptoms of the illness do not include fever, diarrhea, hematemesis or dysuria. The current episode started 6 to 12 hours ago. The onset of the illness was gradual. The problem has been gradually worsening.  The abdominal pain began 6 to 12 hours ago. The pain came on gradually. The abdominal pain has been gradually worsening since its onset. The abdominal pain is generalized. The abdominal pain does not radiate. The severity of the abdominal pain is 10/10. The abdominal pain is relieved by nothing.  The vomiting began today. Vomiting occurs 2 to 5 times per day. The emesis contains stomach contents.  The patient has not had a change in bowel habit. Risk factors for an acute abdominal problem include immunodeficiency (HIV). Additional symptoms associated with the illness include anorexia and diaphoresis. Symptoms associated with the illness do not include urgency, hematuria, frequency or back pain. Significant associated medical issues include HIV. Associated medical issues comments: history of pancreatitis.    Past Medical History  Diagnosis Date  . HIV (human immunodeficiency virus infection)   . Hypertension   . Stroke   . Pancreatitis     Past Surgical History  Procedure Date  . Cardiac surgery     No family history on file.  History  Substance Use Topics  . Smoking status: Current Everyday Smoker  . Smokeless tobacco: Not on file  . Alcohol Use: Yes     12oz  beer/day-beer last pm      Review of Systems  Constitutional: Positive for diaphoresis. Negative for fever.  HENT: Negative.   Eyes: Negative.   Respiratory: Negative.   Cardiovascular: Negative.  Negative for chest pain and palpitations.  Gastrointestinal: Positive for nausea, vomiting, abdominal pain and anorexia. Negative for diarrhea, blood in stool and hematemesis.  Genitourinary: Negative for dysuria, urgency, frequency and hematuria.  Musculoskeletal: Negative for back pain.  Skin: Negative.   All other systems reviewed and are negative.    Allergies  Bee venom; Sulfa antibiotics; and Truvada  Home Medications   Current Outpatient Rx  Name Route Sig Dispense Refill  . DAPSONE 100 MG PO TABS Oral Take 100 mg by mouth daily.    Marland Kitchen PREZISTA PO Oral Take 1 tablet by mouth daily.    Marland Kitchen EPIPEN IJ Injection Inject 1 application as directed once as needed. For sever allergic reaction    . FOLIC ACID 1 MG PO TABS Oral Take 1 mg by mouth daily.    Marland Kitchen HYDROCHLOROTHIAZIDE 12.5 MG PO CAPS Oral Take 12.5 mg by mouth daily.    Marland Kitchen MARAVIROC 150 MG PO TABS Oral Take 150 mg by mouth 2 (two) times daily.    Marland Kitchen MIRTAZAPINE 15 MG PO TABS Oral Take 15 mg by mouth at bedtime.    Marland Kitchen ONDANSETRON HCL 4 MG PO TABS Oral Take 4 mg by mouth every 8 (eight) hours as needed. For nausea    . PANTOPRAZOLE SODIUM 20 MG PO TBEC Oral Take 2 tablets (40 mg total) by mouth  daily. 20 tablet 0  . PROCHLORPERAZINE MALEATE 10 MG PO TABS Oral Take 10 mg by mouth every 6 (six) hours as needed. For nausea    . RALTEGRAVIR POTASSIUM 400 MG PO TABS Oral Take 400 mg by mouth 2 (two) times daily.    Marland Kitchen RITONAVIR 100 MG PO CAPS Oral Take 100 mg by mouth 2 (two) times daily.    . TENOFOVIR DISOPROXIL FUMARATE 300 MG PO TABS Oral Take 300 mg by mouth daily.    Marland Kitchen VALACYCLOVIR HCL 500 MG PO TABS Oral Take 500 mg by mouth daily.    Marland Kitchen VITAMIN B-12 1000 MCG PO TABS Oral Take 1,000 mcg by mouth daily.    . WARFARIN SODIUM 1 MG PO  TABS Oral Take 1 mg by mouth daily.    . WARFARIN SODIUM 5 MG PO TABS Oral Take 5 mg by mouth daily.    Marland Kitchen ZIDOVUDINE 300 MG PO TABS Oral Take 300 mg by mouth 2 (two) times daily.    Marland Kitchen ONDANSETRON HCL 4 MG PO TABS Oral Take 1 tablet (4 mg total) by mouth every 6 (six) hours. 12 tablet 0    BP 149/84  Pulse 89  Temp 97.9 F (36.6 C) (Oral)  Resp 18  SpO2 100%  Physical Exam  Nursing note and vitals reviewed. Constitutional: He is oriented to person, place, and time. He appears well-developed and well-nourished.  HENT:  Head: Normocephalic and atraumatic.  Eyes: Conjunctivae are normal.  Neck: Neck supple.  Cardiovascular: Normal rate, regular rhythm, normal heart sounds and intact distal pulses.   Pulmonary/Chest: Effort normal and breath sounds normal. He has no wheezes. He has no rales.  Abdominal: Soft. He exhibits no distension. There is generalized tenderness. There is guarding. There is no CVA tenderness.  Musculoskeletal: Normal range of motion. He exhibits no edema.  Neurological: He is alert and oriented to person, place, and time.  Skin: Skin is warm and dry.    ED Course  Procedures (including critical care time)  Labs Reviewed  COMPREHENSIVE METABOLIC PANEL - Abnormal; Notable for the following:    Glucose, Bld 162 (*)     All other components within normal limits  URINALYSIS, ROUTINE W REFLEX MICROSCOPIC - Abnormal; Notable for the following:    Color, Urine AMBER (*)  BIOCHEMICALS MAY BE AFFECTED BY COLOR   APPearance CLOUDY (*)     Hgb urine dipstick SMALL (*)     Bilirubin Urine SMALL (*)     Ketones, ur 15 (*)     Protein, ur 30 (*)     All other components within normal limits  URINE MICROSCOPIC-ADD ON - Abnormal; Notable for the following:    Bacteria, UA FEW (*)     All other components within normal limits  PROTIME-INR - Abnormal; Notable for the following:    Prothrombin Time 30.0 (*)     INR 2.81 (*)     All other components within normal limits    CBC WITH DIFFERENTIAL  LIPASE, BLOOD   No results found.   1. Abdominal pain       MDM  46 yo male with PMHx of HIV, HTN, pancreatits, cardiac valve replacement who presents for abdominal pain, nausea, and vomiting that started this morning.  Abdominal pain is generalized and progressively worsening.  States this feels similar to previous episodes of pancreatitis.  No fever, diarrhea or constipation today.  Nml BM earlier today.  AF, VSS.  Generalized abdominal tenderness to palpation and  guarding on exam.  Giving IVF, pain meds, and antiemetics.    CBC and CMP unremarkable.  LFT wnl.  UA negative for UTI.  Lipase wnl.  Doubt pancreatitis.    On reassessment, pt had to be awakened and reported his pain was unchanged.  Pt very distractible on exam and has no pain when pressing with stethoscope but reports pain with palpation.  No RLQ pain, doubt appendicitis.  Will give additional Zofran and NS bolus.  Pt will need outpatient follow-up with PCP for further evaluation of recurrent abdominal pain.  Tx plan discussed with pt who voiced understanding.  Will give Rx for Zofran.  Return precautions provided.        Cherre Robins, MD 10/22/11 2013

## 2012-01-15 ENCOUNTER — Encounter (HOSPITAL_COMMUNITY): Payer: Self-pay | Admitting: *Deleted

## 2012-01-15 ENCOUNTER — Emergency Department (HOSPITAL_COMMUNITY)
Admission: EM | Admit: 2012-01-15 | Discharge: 2012-01-16 | Disposition: A | Discharge status: Home / Self Care | Payer: Medicaid Other | Attending: Emergency Medicine | Admitting: Emergency Medicine

## 2012-01-15 ENCOUNTER — Emergency Department (HOSPITAL_COMMUNITY): Payer: Medicaid Other

## 2012-01-15 DIAGNOSIS — IMO0002 Reserved for concepts with insufficient information to code with codable children: Secondary | ICD-10-CM | POA: Insufficient documentation

## 2012-01-15 DIAGNOSIS — I10 Essential (primary) hypertension: Secondary | ICD-10-CM | POA: Insufficient documentation

## 2012-01-15 DIAGNOSIS — F17200 Nicotine dependence, unspecified, uncomplicated: Secondary | ICD-10-CM | POA: Insufficient documentation

## 2012-01-15 DIAGNOSIS — M1710 Unilateral primary osteoarthritis, unspecified knee: Secondary | ICD-10-CM | POA: Insufficient documentation

## 2012-01-15 DIAGNOSIS — Z7901 Long term (current) use of anticoagulants: Secondary | ICD-10-CM | POA: Insufficient documentation

## 2012-01-15 DIAGNOSIS — Z79899 Other long term (current) drug therapy: Secondary | ICD-10-CM | POA: Insufficient documentation

## 2012-01-15 DIAGNOSIS — Z8719 Personal history of other diseases of the digestive system: Secondary | ICD-10-CM | POA: Insufficient documentation

## 2012-01-15 DIAGNOSIS — Z8673 Personal history of transient ischemic attack (TIA), and cerebral infarction without residual deficits: Secondary | ICD-10-CM | POA: Insufficient documentation

## 2012-01-15 DIAGNOSIS — B20 Human immunodeficiency virus [HIV] disease: Secondary | ICD-10-CM | POA: Insufficient documentation

## 2012-01-15 DIAGNOSIS — M1990 Unspecified osteoarthritis, unspecified site: Secondary | ICD-10-CM

## 2012-01-15 MED ORDER — ONDANSETRON 4 MG PO TBDP
4.0000 mg | ORAL_TABLET | Freq: Once | ORAL | Status: AC
  Administered 2012-01-15: 4 mg via ORAL
  Filled 2012-01-15: qty 1

## 2012-01-15 MED ORDER — IBUPROFEN 800 MG PO TABS: 800.0000 mg | ORAL_TABLET | Freq: Once | ORAL | Status: DC

## 2012-01-15 MED ORDER — HYDROCODONE-ACETAMINOPHEN 5-325 MG PO TABS
1.0000 | ORAL_TABLET | Freq: Once | ORAL | Status: AC
  Administered 2012-01-15: 1 via ORAL
  Filled 2012-01-15: qty 1

## 2012-01-15 NOTE — ED Notes (Signed)
Pt reports right knee pain after standing in [employer] line for 2 hours. Pt reports nausea with the knee pain. Pt rates knee pain 10/10. Pt states that he has not taken any OTC pain medicine, nor iced it, or elevated it. Pt denies injuring knee, falling or twisting knee.

## 2012-01-16 LAB — CBC WITH DIFFERENTIAL/PLATELET
Basophils Absolute: 0 10*3/uL (ref 0.0–0.1)
Eosinophils Relative: 0 % (ref 0–5)
Lymphocytes Relative: 10 % — ABNORMAL LOW (ref 12–46)
Lymphs Abs: 0.9 10*3/uL (ref 0.7–4.0)
Neutro Abs: 6.7 10*3/uL (ref 1.7–7.7)
Neutrophils Relative %: 81 % — ABNORMAL HIGH (ref 43–77)
Platelets: 137 10*3/uL — ABNORMAL LOW (ref 150–400)
RBC: 3.97 MIL/uL — ABNORMAL LOW (ref 4.22–5.81)
RDW: 12 % (ref 11.5–15.5)
WBC: 8.2 10*3/uL (ref 4.0–10.5)

## 2012-01-16 LAB — GRAM STAIN

## 2012-01-16 LAB — SYNOVIAL CELL COUNT + DIFF, W/ CRYSTALS
Eosinophils-Synovial: 0 % (ref 0–1)
Monocyte-Macrophage-Synovial Fluid: 14 % — ABNORMAL LOW (ref 50–90)
Neutrophil, Synovial: 83 % — ABNORMAL HIGH (ref 0–25)
WBC, Synovial: 29246 /mm3 — ABNORMAL HIGH (ref 0–200)

## 2012-01-16 LAB — BASIC METABOLIC PANEL
CO2: 27 mEq/L (ref 19–32)
Calcium: 8.8 mg/dL (ref 8.4–10.5)
Potassium: 3.5 mEq/L (ref 3.5–5.1)
Sodium: 131 mEq/L — ABNORMAL LOW (ref 135–145)

## 2012-01-16 MED ORDER — IBUPROFEN 800 MG PO TABS: 800.0000 mg | ORAL_TABLET | Freq: Three times a day (TID) | ORAL | Status: DC

## 2012-01-16 MED ORDER — HYDROMORPHONE HCL PF 1 MG/ML IJ SOLN
1.0000 mg | Freq: Once | INTRAMUSCULAR | Status: AC
  Administered 2012-01-16: 1 mg via INTRAVENOUS
  Filled 2012-01-16: qty 1

## 2012-01-16 MED ORDER — OXYCODONE-ACETAMINOPHEN 5-325 MG PO TABS: 1.0000 | ORAL_TABLET | ORAL | Status: DC | PRN

## 2012-01-16 MED ORDER — FENTANYL CITRATE 0.05 MG/ML IJ SOLN
100.0000 ug | Freq: Once | INTRAMUSCULAR | Status: AC
  Administered 2012-01-16: 100 ug via INTRAVENOUS
  Filled 2012-01-16: qty 2

## 2012-01-16 NOTE — Progress Notes (Signed)
Orthopedic Tech Progress Note Patient Details:  Brandon [name] 09-03-1965 [id number]  Ortho Devices Type of Ortho Device: Crutches   [name] 01/16/2012, 6:35 AM

## 2012-01-16 NOTE — ED Notes (Signed)
Pt dc to home  With family.  Pt verbalizes understanding to dc instructions.

## 2012-01-16 NOTE — ED Provider Notes (Signed)
Medical screening examination/treatment/procedure(s) were conducted as a shared visit with non-physician practitioner(s) and myself.  I personally evaluated the patient during the encounter.  Knee exam per physician's assistant.  Gram stain of synovial fluid is negative. Suspect noninfectious etiology. Rx steroids and anti-inflammatory agent. Followup with orthopedics   Donnetta Hutching, MD 01/16/12 2315

## 2012-01-16 NOTE — ED Provider Notes (Signed)
History     CSN: [id number]  Arrival date & time 01/15/12  2212   None     Chief Complaint  Patient presents with  . Knee Pain    (Consider location/radiation/quality/duration/timing/severity/associated sxs/prior treatment) HPI History provided by pt.   Pt has h/o poorly controlled HIV.  Most recent CD4 count 30.  PCP is ID at [facility].  Presents w/ c/o right knee pain x 5-6 days.  Acutely worsened today and is currently severe.  Radiates up to hip.  ROM limited and unable to bear weight.  Had a temp of 101 yesterday.  Also associated w/ edema/erythema of joint and paresthesias distal to knee.  Denies trauma.  Has been experiencing neck and back pain w/ bilateral LE paresthesias since yesterday.  His wife reports that his cervical and thoracic spine were severely ttp.  Pt denies having any neck/back pain today.    Past Medical History  Diagnosis Date  . HIV (human immunodeficiency virus infection)   . Hypertension   . Stroke   . Pancreatitis     Past Surgical History  Procedure Date  . Cardiac surgery     No family history on file.  History  Substance Use Topics  . Smoking status: Current Every Day Smoker  . Smokeless tobacco: Not on file  . Alcohol Use: Yes     Comment: 12oz beer/day-beer last pm      Review of Systems  All other systems reviewed and are negative.    Allergies  Bee venom; Sulfa antibiotics; and Truvada  Home Medications   Current Outpatient Rx  Name  Route  Sig  Dispense  Refill  . DAPSONE 100 MG PO TABS   Oral   Take 100 mg by mouth daily.         Marland Kitchen DARUNAVIR ETHANOLATE 300 MG PO TABS   Oral   Take 300 mg by mouth 2 (two) times daily with a meal.         . FOLIC ACID 1 MG PO TABS   Oral   Take 1 mg by mouth daily.         Marland Kitchen HYDROCHLOROTHIAZIDE 12.5 MG PO CAPS   Oral   Take 12.5 mg by mouth daily.         Marland Kitchen MARAVIROC 150 MG PO TABS   Oral   Take 150 mg by mouth 2 (two) times daily.         Marland Kitchen MIRTAZAPINE 30 MG PO  TABS   Oral   Take 30 mg by mouth at bedtime.         Marland Kitchen ONDANSETRON HCL 4 MG PO TABS   Oral   Take 4 mg by mouth every 8 (eight) hours as needed. For nausea         . PROCHLORPERAZINE MALEATE 10 MG PO TABS   Oral   Take 10 mg by mouth every 6 (six) hours as needed. For nausea         . RALTEGRAVIR POTASSIUM 400 MG PO TABS   Oral   Take 400 mg by mouth 2 (two) times daily.         Marland Kitchen RITONAVIR 100 MG PO CAPS   Oral   Take 100 mg by mouth 2 (two) times daily.         . TENOFOVIR DISOPROXIL FUMARATE 300 MG PO TABS   Oral   Take 300 mg by mouth daily.         Marland Kitchen VALACYCLOVIR HCL  1 G PO TABS   Oral   Take 1,000 mg by mouth daily.         Marland Kitchen VITAMIN B-12 1000 MCG PO TABS   Oral   Take 1,000 mcg by mouth daily.         . WARFARIN SODIUM 1 MG PO TABS   Oral   Take 1 mg by mouth daily.         . WARFARIN SODIUM 5 MG PO TABS   Oral   Take 5 mg by mouth daily.         Marland Kitchen ZIDOVUDINE 300 MG PO TABS   Oral   Take 300 mg by mouth 2 (two) times daily.         Marland Kitchen EPIPEN IJ   Injection   Inject 1 application as directed once as needed. For sever allergic reaction         . PANTOPRAZOLE SODIUM 20 MG PO TBEC   Oral   Take 2 tablets (40 mg total) by mouth daily.   20 tablet   0     BP 111/62  Pulse 80  Temp 97.7 F (36.5 C) (Oral)  Resp 18  SpO2 100%  Physical Exam  Nursing note and vitals reviewed. Constitutional: He is oriented to person, place, and time. He appears well-developed and well-nourished. No distress.  HENT:  Head: Normocephalic and atraumatic.  Eyes:       Normal appearance  Neck: Normal range of motion.  Pulmonary/Chest: Effort normal.  Musculoskeletal: Normal range of motion.       Pt holding right knee in 30deg of flexion up on a pillow.  Edema, erythema and tenderness to palpation of entire knee.  Pain w/ minimal passive ROM.  Tenderness of calf and entire upper leg as well.  2+ DP pulse and distal sensation intact.  LLE nml.   Entire spine non-tender.   Neurological: He is alert and oriented to person, place, and time.  Psychiatric: He has a normal mood and affect. His behavior is normal.    ED Course  Apiration of blood/fluid Date/Time: 01/16/2012 7:08 AM Performed by: Otilio Miu Authorized by: Ruby Cola E Consent: Verbal consent obtained. Risks and benefits: risks, benefits and alternatives were discussed Consent given by: patient Preparation: Patient was prepped and draped in the usual sterile fashion. Local anesthesia used: yes Local anesthetic: lidocaine 2% with epinephrine Anesthetic total: 10 ml Patient sedated: no Patient tolerance: Patient tolerated the procedure well with no immediate complications. Comments: R knee joint   (including critical care time)  Labs Reviewed  CBC WITH DIFFERENTIAL - Abnormal; Notable for the following:    RBC 3.97 (*)     Hemoglobin 12.5 (*)     HCT 35.8 (*)     Platelets 137 (*)     Neutrophils Relative 81 (*)     Lymphocytes Relative 10 (*)     All other components within normal limits  BASIC METABOLIC PANEL - Abnormal; Notable for the following:    Sodium 131 (*)     Chloride 95 (*)     Glucose, Bld 149 (*)     All other components within normal limits  CELL COUNT + DIFF,  W/ CRYST-SYNVL FLD - Abnormal; Notable for the following:    Color, Synovial YELLOW (*)     Appearance-Synovial TURBID (*)     WBC, Synovial 16109 (*)     Neutrophil, Synovial 83 (*)     Monocyte-Macrophage-Synovial Fluid 14 (*)  All other components within normal limits  GRAM STAIN  BODY FLUID CULTURE  SYNOVIAL FLUID, CRYSTAL   Dg Knee Complete 4 Views Right  01/15/2012  *RADIOLOGY REPORT*  Clinical Data: Severe right knee pain over the past 2 days.  No known injuries.  RIGHT KNEE - COMPLETE 4+ VIEW  Comparison: None.  Findings: No evidence of acute or subacute fracture or dislocation. Well-preserved joint spaces.  No intrinsic osseous abnormalities. No  evidence of a significant joint effusion.  IMPRESSION: Normal examination.   Original Report Authenticated By: Hulan Saas, M.D.      1. Arthritis       MDM  [phone] M w/ AIDs presents w/ c/o non-traumatic right knee pain.  On exam, red, hot, swollen, severely tender joint w/ limited ROM.  Concern for septic arthritis.  Labs, including joint aspirate are pending.  Pt receiving IV dilaudid for pain.    Pt finally had relief of pain w/ IV fentanyl.  He is resting comfortable.  Synovial fluid does not appear to be infected.  Will treat symptomatically for inflammatory arthritis.  Pt d/c'd home w/ percocet and 800mg  ibuprofen and I recommended ortho and ID f/u as well.  Return precautions discussed.  6:48 AM         Otilio Miu, PA 01/16/12 [id number]  Otilio Miu, PA 01/16/12 [phone]

## 2012-01-19 LAB — BODY FLUID CULTURE

## 2012-02-03 LAB — CBC AND DIFFERENTIAL
HCT: 24 % — AB (ref 41–53)
Hemoglobin: 7.9 g/dL — AB (ref 13.5–17.5)
Platelets: 514 10*3/uL — AB (ref 150–399)
WBC: 5 10^3/mL

## 2012-02-03 LAB — HEPATIC FUNCTION PANEL: Bilirubin, Total: 0.5 mg/dL

## 2012-02-03 LAB — BASIC METABOLIC PANEL: Glucose: 93 mg/dL

## 2012-02-09 ENCOUNTER — Emergency Department (HOSPITAL_COMMUNITY): Payer: Medicaid Other

## 2012-02-09 ENCOUNTER — Encounter (HOSPITAL_COMMUNITY): Payer: Self-pay | Admitting: *Deleted

## 2012-02-09 ENCOUNTER — Inpatient Hospital Stay (HOSPITAL_COMMUNITY)
Admission: EM | Admit: 2012-02-09 | Discharge: 2012-02-14 | DRG: 417 | Disposition: A | Discharge status: Home Health | Payer: Medicaid Other | Attending: Internal Medicine | Admitting: Internal Medicine

## 2012-02-09 DIAGNOSIS — Z952 Presence of prosthetic heart valve: Secondary | ICD-10-CM

## 2012-02-09 DIAGNOSIS — B20 Human immunodeficiency virus [HIV] disease: Secondary | ICD-10-CM | POA: Diagnosis present

## 2012-02-09 DIAGNOSIS — Z79899 Other long term (current) drug therapy: Secondary | ICD-10-CM

## 2012-02-09 DIAGNOSIS — Z8673 Personal history of transient ischemic attack (TIA), and cerebral infarction without residual deficits: Secondary | ICD-10-CM

## 2012-02-09 DIAGNOSIS — R110 Nausea: Secondary | ICD-10-CM | POA: Diagnosis present

## 2012-02-09 DIAGNOSIS — M009 Pyogenic arthritis, unspecified: Secondary | ICD-10-CM | POA: Diagnosis present

## 2012-02-09 DIAGNOSIS — T45515A Adverse effect of anticoagulants, initial encounter: Secondary | ICD-10-CM

## 2012-02-09 DIAGNOSIS — R5381 Other malaise: Secondary | ICD-10-CM | POA: Diagnosis present

## 2012-02-09 DIAGNOSIS — Z954 Presence of other heart-valve replacement: Secondary | ICD-10-CM

## 2012-02-09 DIAGNOSIS — Z9049 Acquired absence of other specified parts of digestive tract: Secondary | ICD-10-CM

## 2012-02-09 DIAGNOSIS — D649 Anemia, unspecified: Secondary | ICD-10-CM

## 2012-02-09 DIAGNOSIS — R109 Unspecified abdominal pain: Secondary | ICD-10-CM | POA: Diagnosis present

## 2012-02-09 DIAGNOSIS — R112 Nausea with vomiting, unspecified: Secondary | ICD-10-CM | POA: Diagnosis present

## 2012-02-09 DIAGNOSIS — D638 Anemia in other chronic diseases classified elsewhere: Secondary | ICD-10-CM | POA: Diagnosis present

## 2012-02-09 DIAGNOSIS — N181 Chronic kidney disease, stage 1: Secondary | ICD-10-CM | POA: Diagnosis present

## 2012-02-09 DIAGNOSIS — I10 Essential (primary) hypertension: Secondary | ICD-10-CM

## 2012-02-09 DIAGNOSIS — K8010 Calculus of gallbladder with chronic cholecystitis without obstruction: Principal | ICD-10-CM | POA: Diagnosis present

## 2012-02-09 DIAGNOSIS — K859 Acute pancreatitis without necrosis or infection, unspecified: Secondary | ICD-10-CM

## 2012-02-09 DIAGNOSIS — K8020 Calculus of gallbladder without cholecystitis without obstruction: Secondary | ICD-10-CM

## 2012-02-09 DIAGNOSIS — D689 Coagulation defect, unspecified: Secondary | ICD-10-CM

## 2012-02-09 DIAGNOSIS — I129 Hypertensive chronic kidney disease with stage 1 through stage 4 chronic kidney disease, or unspecified chronic kidney disease: Secondary | ICD-10-CM | POA: Diagnosis present

## 2012-02-09 DIAGNOSIS — D6832 Hemorrhagic disorder due to extrinsic circulating anticoagulants: Secondary | ICD-10-CM | POA: Diagnosis present

## 2012-02-09 DIAGNOSIS — Z7901 Long term (current) use of anticoagulants: Secondary | ICD-10-CM

## 2012-02-09 HISTORY — DX: Presence of prosthetic heart valve: Z95.2

## 2012-02-09 HISTORY — DX: Unspecified osteoarthritis, unspecified site: M19.90

## 2012-02-09 LAB — URINALYSIS, MICROSCOPIC ONLY
Bilirubin Urine: NEGATIVE
Glucose, UA: NEGATIVE mg/dL
Hgb urine dipstick: NEGATIVE
Ketones, ur: NEGATIVE mg/dL
Leukocytes, UA: NEGATIVE
Nitrite: NEGATIVE
Protein, ur: NEGATIVE mg/dL
Specific Gravity, Urine: 1.017 (ref 1.005–1.030)
Urobilinogen, UA: 0.2 mg/dL (ref 0.0–1.0)
pH: 7 (ref 5.0–8.0)

## 2012-02-09 LAB — COMPREHENSIVE METABOLIC PANEL
Albumin: 3.1 g/dL — ABNORMAL LOW (ref 3.5–5.2)
Alkaline Phosphatase: 104 U/L (ref 39–117)
BUN: 11 mg/dL (ref 6–23)
CO2: 25 mEq/L (ref 19–32)
Chloride: 104 mEq/L (ref 96–112)
GFR calc Af Amer: 83 mL/min — ABNORMAL LOW (ref >90)
Glucose, Bld: 115 mg/dL — ABNORMAL HIGH (ref 70–99)
Potassium: 4.4 mEq/L (ref 3.5–5.1)
Total Bilirubin: 0.3 mg/dL (ref 0.3–1.2)

## 2012-02-09 LAB — COMPREHENSIVE METABOLIC PANEL WITH GFR
ALT: 23 U/L (ref 0–53)
AST: 29 U/L (ref 0–37)
Calcium: 9.5 mg/dL (ref 8.4–10.5)
Creatinine, Ser: 1.21 mg/dL (ref 0.50–1.35)
GFR calc non Af Amer: 71 mL/min — ABNORMAL LOW (ref >90)
Sodium: 139 meq/L (ref 135–145)
Total Protein: 7.7 g/dL (ref 6.0–8.3)

## 2012-02-09 LAB — CBC WITH DIFFERENTIAL/PLATELET
Basophils Absolute: 0.1 10*3/uL (ref 0.0–0.1)
Basophils Relative: 1 % (ref 0–1)
Eosinophils Absolute: 0 K/uL (ref 0.0–0.7)
Eosinophils Relative: 0 % (ref 0–5)
HCT: 21.6 % — ABNORMAL LOW (ref 39.0–52.0)
Hemoglobin: 7.5 g/dL — ABNORMAL LOW (ref 13.0–17.0)
Lymphocytes Relative: 20 % (ref 12–46)
Lymphs Abs: 1.2 10*3/uL (ref 0.7–4.0)
MCH: 33 pg (ref 26.0–34.0)
MCHC: 34.7 g/dL (ref 30.0–36.0)
MCV: 95.2 fL (ref 78.0–100.0)
Monocytes Absolute: 0.5 10*3/uL (ref 0.1–1.0)
Monocytes Relative: 8 % (ref 3–12)
Neutro Abs: 4.2 K/uL (ref 1.7–7.7)
Neutrophils Relative %: 71 % (ref 43–77)
Platelets: 361 10*3/uL (ref 150–400)
RBC: 2.27 MIL/uL — ABNORMAL LOW (ref 4.22–5.81)
RDW: 17.3 % — ABNORMAL HIGH (ref 11.5–15.5)
WBC: 6 10*3/uL (ref 4.0–10.5)

## 2012-02-09 LAB — LIPASE, BLOOD: Lipase: 76 U/L — ABNORMAL HIGH (ref 11–59)

## 2012-02-09 LAB — PROTIME-INR
INR: 1.41 (ref 0.00–1.49)
Prothrombin Time: 16.9 s — ABNORMAL HIGH (ref 11.6–15.2)

## 2012-02-09 LAB — PREPARE RBC (CROSSMATCH)

## 2012-02-09 LAB — OCCULT BLOOD, POC DEVICE: Fecal Occult Bld: NEGATIVE

## 2012-02-09 MED ORDER — HYDROMORPHONE HCL PF 1 MG/ML IJ SOLN
1.0000 mg | Freq: Once | INTRAMUSCULAR | Status: AC
  Administered 2012-02-09: 1 mg via INTRAVENOUS
  Filled 2012-02-09: qty 1

## 2012-02-09 MED ORDER — HEPARIN (PORCINE) IN NACL 100-0.45 UNIT/ML-% IJ SOLN
1100.0000 [IU]/h | INTRAMUSCULAR | Status: DC
  Administered 2012-02-09: 850 [IU]/h via INTRAVENOUS
  Administered 2012-02-10: 1100 [IU]/h via INTRAVENOUS
  Filled 2012-02-09 (×2): qty 250

## 2012-02-09 MED ORDER — ACETAMINOPHEN 650 MG RE SUPP: 650.0000 mg | Freq: Four times a day (QID) | RECTAL | Status: DC | PRN

## 2012-02-09 MED ORDER — PANTOPRAZOLE SODIUM 40 MG IV SOLR
40.0000 mg | Freq: Once | INTRAVENOUS | Status: AC
  Administered 2012-02-09: 40 mg via INTRAVENOUS
  Filled 2012-02-09: qty 40

## 2012-02-09 MED ORDER — ONDANSETRON HCL 4 MG/2ML IJ SOLN
4.0000 mg | Freq: Four times a day (QID) | INTRAMUSCULAR | Status: DC | PRN
  Administered 2012-02-10 – 2012-02-14 (×8): 4 mg via INTRAVENOUS
  Filled 2012-02-09 (×8): qty 2

## 2012-02-09 MED ORDER — HYDROMORPHONE HCL PF 1 MG/ML IJ SOLN
0.5000 mg | INTRAMUSCULAR | Status: DC | PRN
  Administered 2012-02-10 – 2012-02-13 (×20): 1 mg via INTRAVENOUS
  Filled 2012-02-09 (×20): qty 1

## 2012-02-09 MED ORDER — ZOLPIDEM TARTRATE 5 MG PO TABS: 5.0000 mg | ORAL_TABLET | Freq: Every evening | ORAL | Status: DC | PRN

## 2012-02-09 MED ORDER — ONDANSETRON HCL 4 MG/2ML IJ SOLN
4.0000 mg | Freq: Once | INTRAMUSCULAR | Status: AC
  Administered 2012-02-09: 4 mg via INTRAVENOUS
  Filled 2012-02-09: qty 2

## 2012-02-09 MED ORDER — OXYCODONE HCL 5 MG PO TABS
5.0000 mg | ORAL_TABLET | ORAL | Status: DC | PRN
  Administered 2012-02-09 – 2012-02-11 (×5): 5 mg via ORAL
  Filled 2012-02-09 (×5): qty 1

## 2012-02-09 MED ORDER — SODIUM CHLORIDE 0.9 % IV SOLN
INTRAVENOUS | Status: DC
  Administered 2012-02-09: via INTRAVENOUS
  Administered 2012-02-10: 100 mL via INTRAVENOUS
  Administered 2012-02-10 – 2012-02-11 (×2): via INTRAVENOUS
  Administered 2012-02-12: 1000 mL via INTRAVENOUS
  Administered 2012-02-12 (×2): via INTRAVENOUS
  Administered 2012-02-12: 100 mL/h via INTRAVENOUS
  Administered 2012-02-13: via INTRAVENOUS
  Administered 2012-02-13: 1000 mL via INTRAVENOUS
  Administered 2012-02-13 – 2012-02-14 (×2): via INTRAVENOUS

## 2012-02-09 MED ORDER — ONDANSETRON HCL 4 MG PO TABS: 4.0000 mg | ORAL_TABLET | Freq: Four times a day (QID) | ORAL | Status: DC | PRN

## 2012-02-09 MED ORDER — HYDROMORPHONE HCL PF 1 MG/ML IJ SOLN
1.0000 mg | Freq: Once | INTRAMUSCULAR | Status: AC
  Administered 2012-02-09: 1 mg via INTRAMUSCULAR

## 2012-02-09 MED ORDER — PANTOPRAZOLE SODIUM 40 MG IV SOLR
40.0000 mg | Freq: Two times a day (BID) | INTRAVENOUS | Status: DC
  Administered 2012-02-10 – 2012-02-14 (×10): 40 mg via INTRAVENOUS
  Filled 2012-02-09 (×10): qty 40

## 2012-02-09 MED ORDER — HYDROMORPHONE HCL PF 1 MG/ML IJ SOLN
1.0000 mg | Freq: Once | INTRAMUSCULAR | Status: DC
  Filled 2012-02-09: qty 1

## 2012-02-09 MED ORDER — ACETAMINOPHEN 325 MG PO TABS
650.0000 mg | ORAL_TABLET | Freq: Four times a day (QID) | ORAL | Status: DC | PRN
Start: 2012-02-09 — End: 2012-02-14
  Administered 2012-02-11: 650 mg via ORAL
  Filled 2012-02-09: qty 2

## 2012-02-09 MED ORDER — SODIUM CHLORIDE 0.9 % IV BOLUS (SEPSIS)
1000.0000 mL | Freq: Once | INTRAVENOUS | Status: AC
  Administered 2012-02-09: 1000 mL via INTRAVENOUS

## 2012-02-09 MED ORDER — METOCLOPRAMIDE HCL 5 MG/ML IJ SOLN
10.0000 mg | Freq: Once | INTRAMUSCULAR | Status: AC
  Administered 2012-02-09: 10 mg via INTRAVENOUS
  Filled 2012-02-09: qty 2

## 2012-02-09 MED ORDER — ALUM & MAG HYDROXIDE-SIMETH 200-200-20 MG/5ML PO SUSP: 30.0000 mL | Freq: Four times a day (QID) | ORAL | Status: DC | PRN

## 2012-02-09 NOTE — H&P (Signed)
[facility] History and Physical  Istvan Behar AVW:[id number] DOB: 1965/08/12 DOA: 02/09/2012  Referring physician:  EDP PCP: [name], MD  Specialists:   Chief Complaint: Nausea and Vomiting and ABD Pain  HPI: Brandon [name] is a 46 y.o. male who presents to the ED with complaints of Severe Nausea and Vomiting and Diffuse ABD Pain since the AM.  The pain is crampy and intermittent at the worse the pain is a 10/10. He has had multple episodes of nausea and vomiting and has not been able to hold down any foods or liquids.  His emesis was brown.   He denies having any diarrhea or melena or hematochezia.  A lipase level was found to be 75, and he has had pancreatitis in the past.    He was recently hospitalized at [facility] for Septic Arthritis of the left knee and underwent I+D and had a PICC line placed for continued antibiotic therapy. He was hospitalized for 14 days. He was discharged on IV Cipro and Azithromycin.  H e also has HIV Disease, and has had an AVR and is on Coumadin therapy, which is currently subtherapeutic.   He also has anemia with a hemoglobin level of 7.5 on intake, and when he was hospitalized at [facility], his hemoglobin level decreased from 12 on admission, and on discharge was 8.  The medical records for [facility] were requested and reviewed.  Patient notes that he receives all of his care at [facility].     Review of Systems: The patient denies anorexia, fever, weight loss, vision loss, decreased hearing, hoarseness, chest pain, syncope, dyspnea on exertion, peripheral edema, balance deficits, hemoptysis, melena, hematochezia, severe indigestion/heartburn, hematuria, incontinence, genital sores, muscle weakness, suspicious skin lesions, transient blindness, difficulty walking, depression, unusual weight change, abnormal bleeding, enlarged lymph nodes, angioedema, and breast masses.    Past Medical History  Diagnosis Date  . HIV (human immunodeficiency virus infection)   .  Hypertension   . Stroke   . Pancreatitis   . Mechanical heart valve present    Past Surgical History  Procedure Date  . Cardiac surgery     Medications:  HOME MEDS: Prior to Admission medications   Medication Sig Start Date End Date Taking? Authorizing Provider  azithromycin (ZITHROMAX) 600 MG tablet Take 1,200 mg by mouth every 7 (seven) days.  02/03/12  Yes Historical Provider, MD  Calcium-Vitamin D-Vitamin K (VIACTIV) 500-500-40 MG-UNT-MCG CHEW Chew 2 tablets by mouth Daily. Take 2 tablets by mouth daily.   Yes Historical Provider, MD  ciprofloxacin (CIPRO) 500 MG tablet Take 500 mg by mouth Every 12 hours. Take 1 tablet (500 mg total) by mouth every 12 hours for 15 days. Last dose on 02/18/12. 02/03/12 02/18/12 Yes Historical Provider, MD  dapsone 100 MG tablet Take 100 mg by mouth daily.   Yes Historical Provider, MD  DAPTOmycin (CUBICIN) 500 MG injection Inject 500 mg into the vein Daily. Infuse 500 mg into the vein daily for 15 doses. Last dose on 02/18/12. 02/03/12 02/18/12 Yes Historical Provider, MD  darunavir (PREZISTA) 300 MG tablet Take 300 mg by mouth 2 (two) times daily with a meal.   Yes Historical Provider, MD  dronabinol (MARINOL) 2.5 MG capsule Take 1 capsule by mouth BID times 48H. TAKE ONE CAPSULE BY MOUTH TWICE A DAY 01/18/11  Yes Historical Provider, MD  EPINEPHrine (EPIPEN IJ) Inject 1 application as directed once as needed. For sever allergic reaction   Yes Historical Provider, MD  folic acid (FOLVITE) 1 MG  tablet Take 1 mg by mouth daily.   Yes Historical Provider, MD  ibuprofen (ADVIL,MOTRIN) 800 MG tablet Take 800 mg by mouth every 8 (eight) hours as needed. For pain 01/16/12  Yes Catherine E Schinlever, PA-C  maraviroc (SELZENTRY) 150 MG tablet Take 150 mg by mouth 2 (two) times daily.   Yes Historical Provider, MD  mirtazapine (REMERON) 30 MG tablet Take 30 mg by mouth at bedtime.   Yes Historical Provider, MD  nystatin (MYCOSTATIN) 100000 UNIT/ML suspension  Take 5 mLs by mouth 4 times daily. Take 5 mLs (500,000 Units total) by mouth 4 times daily for 10 days. (swish and swallow each dose) 11/27/11  Yes Historical Provider, MD  ondansetron (ZOFRAN) 4 MG tablet Take 4 mg by mouth every 8 (eight) hours as needed. For nausea   Yes Historical Provider, MD  oxyCODONE (ROXICODONE) 15 MG immediate release tablet Take 15 mg by mouth Every 8 hours as needed. Take 1 tablet (15 mg total) by mouth every 8 (eight) hours as needed for 30 doses. 02/03/12  Yes Historical Provider, MD  oxyCODONE-acetaminophen (PERCOCET/ROXICET) 5-325 MG per tablet Take 1 tablet by mouth every 4 (four) hours as needed. For pain 01/16/12 02/09/12 Yes Catherine E Schinlever, PA-C  pantoprazole (PROTONIX) 20 MG tablet Take 40 mg by mouth daily. 09/21/11 09/20/12 Yes Fayrene Helper, PA-C  prochlorperazine (COMPAZINE) 10 MG tablet Take 10 mg by mouth every 6 (six) hours as needed. For nausea   Yes Historical Provider, MD  raltegravir (ISENTRESS) 400 MG tablet Take 400 mg by mouth 2 (two) times daily.   Yes Historical Provider, MD  ritonavir (NORVIR) 100 MG capsule Take 100 mg by mouth 2 (two) times daily.   Yes Historical Provider, MD  valACYclovir (VALTREX) 1000 MG tablet Take 1,000 mg by mouth daily.   Yes Historical Provider, MD  vitamin B-12 (CYANOCOBALAMIN) 1000 MCG tablet Take 1,000 mcg by mouth daily.   Yes Historical Provider, MD  warfarin (COUMADIN) 1 MG tablet Take 4 mg by mouth daily.   Yes Historical Provider, MD  zidovudine (RETROVIR) 300 MG tablet Take 300 mg by mouth 2 (two) times daily.   Yes Historical Provider, MD    Allergies:  Allergies  Allergen Reactions  . Bee Venom Anaphylaxis  . Sulfa Antibiotics Anaphylaxis  . Truvada (Emtricitabine-Tenofovir) Anaphylaxis  . Lidoderm (Lidocaine) Other (See Comments)    Reaction unknown    Social History:   reports that he has been smoking.  He does not have any smokeless tobacco history on file. He reports that he drinks alcohol. He  reports that he uses illicit drugs (Marijuana).   Family History  Problem Relation Age of Onset  . Hypertension Father   . Hypertension Sister   . Diabetes Maternal Aunt   . Cancer - Prostate Father   . Cancer - Other Cousin   . Parkinson's disease Paternal Aunt      Physical Exam:  GEN:     examined  and in no acute distress; cooperative with exam Filed Vitals:   02/09/12 2031 02/09/12 2115 02/09/12 2117 02/09/12 2130  BP: 112/68 114/72  110/73  Pulse: 93 95  99  Temp: 98.9 F (37.2 C) 98.6 F (37 C)    TempSrc: Oral Oral    Resp: 18     Weight:   58 kg (127 lb 13.9 oz)   SpO2: 97% 98%  99%   Blood pressure 110/73, pulse 99, temperature 98.6 F (37 C), temperature source Oral, resp. rate 18,  weight 58 kg (127 lb 13.9 oz), SpO2 99.00%. PSYCH: He is alert and oriented x4; does not appear anxious does not appear depressed; affect is normal HEENT: Normocephalic and Atraumatic, Mucous membranes pink; PERRLA; EOM intact; Fundi:  Benign;  No scleral icterus, Nares: Patent, Oropharynx: Clear, Fair Dentition, Neck:  FROM, no cervical lymphadenopathy nor thyromegaly or carotid bruit; no JVD; Breasts:: Not examined CHEST WALL: No tenderness CHEST: Normal respiration, clear to auscultation bilaterally HEART: Regular rate and rhythm; no murmurs rubs or gallops BACK: No kyphosis or scoliosis; no CVA tenderness ABDOMEN: Positive Bowel Sounds,  soft non-tender; no masses, no organomegaly.   Rectal Exam: Not done EXTREMITIES: No bone or joint deformity; age-appropriate arthropathy of the hands and knees; no cyanosis, clubbing or edema; no ulcerations. Genitalia: not examined PULSES: 2+ and symmetric SKIN: Normal hydration no rash or ulceration CNS: Cranial nerves 2-12 grossly intact no focal neurologic deficit    Labs on Admission:  Basic Metabolic Panel:  Lab 02/09/12 1610  NA 139  K 4.4  CL 104  CO2 25  GLUCOSE 115*  BUN 11  CREATININE 1.21  CALCIUM 9.5  MG --  PHOS --     Liver Function Tests:  Lab 02/09/12 1825  AST 29  ALT 23  ALKPHOS 104  BILITOT 0.3  PROT 7.7  ALBUMIN 3.1*    Lab 02/09/12 1825  LIPASE 76*  AMYLASE --   No results found for this basename: AMMONIA:5 in the last 168 hours CBC:  Lab 02/09/12 1825  WBC 6.0  NEUTROABS 4.2  HGB 7.5*  HCT 21.6*  MCV 95.2  PLT 361   Cardiac Enzymes: No results found for this basename: CKTOTAL:5,CKMB:5,CKMBINDEX:5,TROPONINI:5 in the last 168 hours  BNP (last 3 results) No results found for this basename: PROBNP:3 in the last 8760 hours CBG: No results found for this basename: GLUCAP:5 in the last 168 hours  Radiological Exams on Admission: Dg Chest 2 View  02/09/2012  *RADIOLOGY REPORT*  Clinical Data: Fever.  Abdominal pain.  Nausea and vomiting.  CHEST - 2 VIEW  Comparison: None.  Findings: Prior sternotomy for aortic valve replacement.  Cardiac silhouette normal in size.  Prominent main pulmonary trunk and left pulmonary artery.  Hilar and mediastinal contours otherwise unremarkable.  Lungs clear.  Bronchovascular markings normal. Pulmonary vascularity normal.  No pneumothorax.  No pleural effusions.  Visualized bony thorax intact.  Left arm PICC tip in the lower SVC at the cavoatrial junction.  IMPRESSION: No acute cardiopulmonary disease.   Original Report Authenticated By: Hulan Saas, M.D.    US Abdomen Complete  02/09/2012  *RADIOLOGY REPORT*  Clinical Data:  Abdominal pain.  Vomiting.  COMPLETE ABDOMINAL ULTRASOUND  Comparison:  CT scan of the abdomen dated 04/12/2011  Findings:  Gallbladder:  There are several small mobile gallstones. Gallbladder wall is not thickened.  Negative sonographic Murphy's sign.  Common bile duct:  Normal.  3.8 mm maximum diameter.  Liver:  1.3 cm echogenic lesion in the lateral aspect of the left lobe of the liver consistent with a benign hemangioma.  Otherwise normal.  IVC:  Normal.  Pancreas:  Normal.  Spleen:  Normal.7.7 cm in length.  Right Kidney:   Normal.  10.7 cm in length.  Left Kidney:  Normal.  0.3 cm in length.  Abdominal aorta:  No aneurysm.  Maximal diameter 2.2 cm.  Calcified plaque in the mid abdominal aorta.  IMPRESSION: Several small mobile gallstones. Small hemangioma in the left lobe of the liver.  Original Report Authenticated By: Francene Boyers, M.D.      Assessment: Principal Problem:  *Acute pancreatitis Active Problems:  Nausea & vomiting  Abdominal pain  Anemia  S/P AVR (aortic valve replacement)  Septic arthritis of knee  Warfarin-induced coagulopathy  HIV disease    Plan:       Admit to MED/Surg Bed 1.  Pain Control, and Anti-Emetics PRN IV Protonix Clear Liquids IVFs  2.  Check Anemia Panel H/H checks q 8 hrs  X 6 FOBT Result  Pending Transfuse 1 unit of pRBCs [facility] Needs to be Consulted per Patient and Family Request.    3.  S/P AVR- And currently subtherapeutic on Coumadin Start low dose IV Heparin drip per pharmacy  4.  Continue Antibiotic therapy for Septic Arthritis  5.  Reconcile Home Medications, Hold Coumadin for now.       Code Status:  DNR/DNI Family Communication: Family at Bedside Disposition Plan:   Time spent: 4 Minutes  Ron Parker [facility] Pager [phone]  If 7PM-7AM, please contact night-coverage [email or web address] Password [email or web address] 02/09/2012, 10:01 PM

## 2012-02-09 NOTE — ED Notes (Signed)
Back from [place], family x2 at [facility], pt alert, NAD, calm, interactive, skin W&D, resps e/u, speaking in clear complete sentences, reports pain increased after [place], rates 6/10, some mild nausea remains. Updated. Pending lab draw (T&Screen), occult blood rectal exam & UA. Mentions possible desire for transfer to [facility] (family talking about options).

## 2012-02-09 NOTE — ED Notes (Signed)
Patient transported to Ultrasound 

## 2012-02-09 NOTE — Progress Notes (Signed)
ANTICOAGULATION CONSULT NOTE - Initial Consult  Pharmacy Consult for heparin Indication: hx of AVR  Allergies  Allergen Reactions  . Bee Venom Anaphylaxis  . Sulfa Antibiotics Anaphylaxis  . Truvada (Emtricitabine-Tenofovir) Anaphylaxis  . Lidoderm (Lidocaine) Other (See Comments)    Reaction unknown    Patient Measurements:   Heparin Dosing Weight: 58 kg  Vital Signs: Temp: 98.6 F (37 C) (12/01 2115) Temp src: Oral (12/01 2115) BP: 110/73 mmHg (12/01 2130) Pulse Rate: 99  (12/01 2130)  Labs:  Basename 02/09/12 1825  HGB 7.5*  HCT 21.6*  PLT 361  APTT --  LABPROT 16.9*  INR 1.41  HEPARINUNFRC --  CREATININE 1.21  CKTOTAL --  CKMB --  TROPONINI --    CrCl is unknown because there is no height on file for the current visit.   Medical History: Past Medical History  Diagnosis Date  . HIV (human immunodeficiency virus infection)   . Hypertension   . Stroke   . Pancreatitis   . Mechanical heart valve present     Medications:  Scheduled:    . [COMPLETED]  HYDROmorphone (DILAUDID) injection  1 mg Intramuscular Once  . [COMPLETED]  HYDROmorphone (DILAUDID) injection  1 mg Intravenous Once  . [COMPLETED]  HYDROmorphone (DILAUDID) injection  1 mg Intravenous Once  . [COMPLETED] metoCLOPramide (REGLAN) injection  10 mg Intravenous Once  . [COMPLETED] ondansetron (ZOFRAN) IV  4 mg Intravenous Once  . [COMPLETED] ondansetron (ZOFRAN) IV  4 mg Intravenous Once  . [COMPLETED] pantoprazole (PROTONIX) IV  40 mg Intravenous Once  . pantoprazole (PROTONIX) IV  40 mg Intravenous Q12H  . [DISCONTINUED]  HYDROmorphone (DILAUDID) injection  1 mg Intravenous Once   Infusions:    . sodium chloride    . [COMPLETED] sodium chloride Stopped (02/09/12 1951)    Assessment: 46 yo male with hx of AVR will be started on heparin therapy.  Hgb 7.5; Plt 361K.  Patient was on coumadin prior to admission.  INR is 1.41.  May have GI workup per MD for decreased Hgb. Goal of  Therapy:  Heparin level 0.3-0.7 units/ml Monitor platelets by anticoagulation protocol: Yes   Plan:  1) Start heparin at 850 units/hr. No bolus. 2) Heparin level in 6hr 3) Daily heparin level and CBC  Yuri Flener, Tsz-Yin 02/09/2012,9:50 PM

## 2012-02-09 NOTE — ED Notes (Signed)
Patient is unable to void at present. 

## 2012-02-09 NOTE — ED Notes (Signed)
Pt and family reports n/v since 0900 and having abd pain, denies diarrhea or fever. Pt was dc from hospital on Monday for septic arthritis. Pt has picc line in place, wants blood drawn from it. Has tried compazine and zofran pta with no relief.

## 2012-02-09 NOTE — ED Provider Notes (Signed)
History     CSN: [id number]  Arrival date & time 02/09/12  1350   First MD Initiated Contact with Patient 02/09/12 1525      Chief Complaint  Patient presents with  . Abdominal Pain  . Emesis    (Consider location/radiation/quality/duration/timing/severity/associated sxs/prior treatment) HPI Comments: Patient with history of HIV, chronic pancreatitis, known gallstones presents with abdominal pain that is diffuse associated with several episodes of nausea and vomiting and continued dry heaving since he woke up this morning. No diarrhea, no fever or chills. Patient is currently receiving IV antibiotics through a PICC line in his left upper extremity do to right knee septic arthritis. Patient reports that his knee pain and swelling is improving steadily. Symptoms seem a little worse than usual, however quality and onset is similar to prior episodes of his chronic abdominal and vomiting symptoms. In review of records, he had a CT scan in February showing gallstones. He has had numerous lipase draws in our system all of which have always been normal. The patient rarely drinks and has not had an alcoholic drink in more than 3 weeks per family. No obvious sick contacts. No trauma. No change in medications other than the recent treatment with his septic arthritis. Patient denies pain radiates to his flanks, chest, pelvis. It is a crampy quality. He initially reported that the nausea and vomiting occurred first than the pain, however family corrected him and apparently he did complain of pain before the vomiting occurred.  Patient is a 46 y.o. male presenting with abdominal pain and vomiting. The history is provided by the patient, a relative and medical records.  Abdominal Pain The primary symptoms of the illness include abdominal pain, nausea and vomiting. The primary symptoms of the illness do not include diarrhea.  Emesis  Associated symptoms include abdominal pain and arthralgias. Pertinent  negatives include no diarrhea.    Past Medical History  Diagnosis Date  . HIV (human immunodeficiency virus infection)   . Hypertension   . Stroke   . Pancreatitis   . Mechanical heart valve present     Past Surgical History  Procedure Date  . Cardiac surgery     History reviewed. No pertinent family history.  History  Substance Use Topics  . Smoking status: Current Every Day Smoker  . Smokeless tobacco: Not on file  . Alcohol Use: Yes     Comment: 12oz beer/day-beer last pm      Review of Systems  Gastrointestinal: Positive for nausea, vomiting and abdominal pain. Negative for diarrhea.  Musculoskeletal: Positive for arthralgias.  All other systems reviewed and are negative.    Allergies  Bee venom; Sulfa antibiotics; Truvada; and Lidoderm  Home Medications   Current Outpatient Rx  Name  Route  Sig  Dispense  Refill  . AZITHROMYCIN 600 MG PO TABS   Oral   Take 1,200 mg by mouth every 7 (seven) days.          Marland Kitchen CALCIUM-VITAMIN D-VITAMIN K 500-500-40 MG-UNT-MCG PO CHEW   Oral   Chew 2 tablets by mouth Daily. Take 2 tablets by mouth daily.         Marland Kitchen CIPROFLOXACIN HCL 500 MG PO TABS   Oral   Take 500 mg by mouth Every 12 hours. Take 1 tablet (500 mg total) by mouth every 12 hours for 15 days. Last dose on 02/18/12.         Marland Kitchen DAPSONE 100 MG PO TABS   Oral   Take 100  mg by mouth daily.         Marland Kitchen DAPTOMYCIN 500 MG IV SOLR   Intravenous   Inject 500 mg into the vein Daily. Infuse 500 mg into the vein daily for 15 doses. Last dose on 02/18/12.         Marland Kitchen DARUNAVIR ETHANOLATE 300 MG PO TABS   Oral   Take 300 mg by mouth 2 (two) times daily with a meal.         . DRONABINOL 2.5 MG PO CAPS   Oral   Take 1 capsule by mouth BID times 48H. TAKE ONE CAPSULE BY MOUTH TWICE A DAY         . EPIPEN IJ   Injection   Inject 1 application as directed once as needed. For sever allergic reaction         . FOLIC ACID 1 MG PO TABS   Oral   Take 1 mg  by mouth daily.         . IBUPROFEN 800 MG PO TABS   Oral   Take 800 mg by mouth every 8 (eight) hours as needed. For pain         . MARAVIROC 150 MG PO TABS   Oral   Take 150 mg by mouth 2 (two) times daily.         Marland Kitchen MIRTAZAPINE 30 MG PO TABS   Oral   Take 30 mg by mouth at bedtime.         . NYSTATIN 100000 UNIT/ML MT SUSP   Oral   Take 5 mLs by mouth 4 times daily. Take 5 mLs (500,000 Units total) by mouth 4 times daily for 10 days. (swish and swallow each dose)         . ONDANSETRON HCL 4 MG PO TABS   Oral   Take 4 mg by mouth every 8 (eight) hours as needed. For nausea         . OXYCODONE HCL 15 MG PO TABS   Oral   Take 15 mg by mouth Every 8 hours as needed. Take 1 tablet (15 mg total) by mouth every 8 (eight) hours as needed for 30 doses.         . OXYCODONE-ACETAMINOPHEN 5-325 MG PO TABS   Oral   Take 1 tablet by mouth every 4 (four) hours as needed. For pain         . PANTOPRAZOLE SODIUM 20 MG PO TBEC   Oral   Take 40 mg by mouth daily.         Marland Kitchen PROCHLORPERAZINE MALEATE 10 MG PO TABS   Oral   Take 10 mg by mouth every 6 (six) hours as needed. For nausea         . RALTEGRAVIR POTASSIUM 400 MG PO TABS   Oral   Take 400 mg by mouth 2 (two) times daily.         Marland Kitchen RITONAVIR 100 MG PO CAPS   Oral   Take 100 mg by mouth 2 (two) times daily.         Marland Kitchen VALACYCLOVIR HCL 1 G PO TABS   Oral   Take 1,000 mg by mouth daily.         Marland Kitchen VITAMIN B-12 1000 MCG PO TABS   Oral   Take 1,000 mcg by mouth daily.         . WARFARIN SODIUM 1 MG PO TABS   Oral   Take 4  mg by mouth daily.         Marland Kitchen ZIDOVUDINE 300 MG PO TABS   Oral   Take 300 mg by mouth 2 (two) times daily.           BP 112/68  Pulse 93  Temp 98.9 F (37.2 C) (Oral)  Resp 18  SpO2 97%  Physical Exam  Nursing note and vitals reviewed. Constitutional: He appears well-developed and well-nourished.  HENT:  Head: Normocephalic and atraumatic.  Nose: Right sinus  exhibits no maxillary sinus tenderness and no frontal sinus tenderness. Left sinus exhibits no maxillary sinus tenderness and no frontal sinus tenderness.  Mouth/Throat: Mucous membranes are dry.  Eyes: EOM are normal.  Neck: Normal range of motion and full passive range of motion without pain. Neck supple. No Brudzinski's sign and no Kernig's sign noted.  Cardiovascular: Regular rhythm.   No murmur heard. Pulmonary/Chest: Effort normal. No respiratory distress. He has no wheezes.  Genitourinary: Rectal exam shows no fissure, no mass, no tenderness and anal tone normal. Guaiac negative stool.  Neurological: He is alert. No cranial nerve deficit. Coordination normal.  Skin: Skin is warm. There is pallor.    ED Course  Procedures (including critical care time)  Labs Reviewed  COMPREHENSIVE METABOLIC PANEL - Abnormal; Notable for the following:    Glucose, Bld 115 (*)     Albumin 3.1 (*)     GFR calc non Af Amer 71 (*)     GFR calc Af Amer 83 (*)     All other components within normal limits  CBC WITH DIFFERENTIAL - Abnormal; Notable for the following:    RBC 2.27 (*)     Hemoglobin 7.5 (*)     HCT 21.6 (*)     RDW 17.3 (*)     All other components within normal limits  LIPASE, BLOOD - Abnormal; Notable for the following:    Lipase 76 (*)     All other components within normal limits  PROTIME-INR - Abnormal; Notable for the following:    Prothrombin Time 16.9 (*)     All other components within normal limits  URINALYSIS, MICROSCOPIC ONLY  TYPE AND SCREEN   Dg Chest 2 View  02/09/2012  *RADIOLOGY REPORT*  Clinical Data: Fever.  Abdominal pain.  Nausea and vomiting.  CHEST - 2 VIEW  Comparison: None.  Findings: Prior sternotomy for aortic valve replacement.  Cardiac silhouette normal in size.  Prominent main pulmonary trunk and left pulmonary artery.  Hilar and mediastinal contours otherwise unremarkable.  Lungs clear.  Bronchovascular markings normal. Pulmonary vascularity normal.   No pneumothorax.  No pleural effusions.  Visualized bony thorax intact.  Left arm PICC tip in the lower SVC at the cavoatrial junction.  IMPRESSION: No acute cardiopulmonary disease.   Original Report Authenticated By: Hulan Saas, M.D.    US Abdomen Complete  02/09/2012  *RADIOLOGY REPORT*  Clinical Data:  Abdominal pain.  Vomiting.  COMPLETE ABDOMINAL ULTRASOUND  Comparison:  CT scan of the abdomen dated 04/12/2011  Findings:  Gallbladder:  There are several small mobile gallstones. Gallbladder wall is not thickened.  Negative sonographic Murphy's sign.  Common bile duct:  Normal.  3.8 mm maximum diameter.  Liver:  1.3 cm echogenic lesion in the lateral aspect of the left lobe of the liver consistent with a benign hemangioma.  Otherwise normal.  IVC:  Normal.  Pancreas:  Normal.  Spleen:  Normal.7.7 cm in length.  Right Kidney:  Normal.  10.7 cm in  length.  Left Kidney:  Normal.  0.3 cm in length.  Abdominal aorta:  No aneurysm.  Maximal diameter 2.2 cm.  Calcified plaque in the mid abdominal aorta.  IMPRESSION: Several small mobile gallstones. Small hemangioma in the left lobe of the liver.   Original Report Authenticated By: Francene Boyers, M.D.      1. Pancreatitis   2. Abdominal pain   3. Anemia   4. Gallstones      Room air saturation is 100% I interpret this to be normal.\  8:48 PM Pt has required several rounds of analgesics and antimetics.  Pt's lipase is mildly elevated.  U/S shows no cholecystitis.  Pt's Hgb is 7.5, unusual considering his only surgery was to clean out his knee.  Records from [facility] have been requested.  Will admit to [facility] for symptoms control, work up of anemia.  Could be related to HIV.   MDM  I reviewed the patient's multiple ED notes previously as well as a CT scan from February. Patient is a difficult IV stick. IV team had been called by RN to access his PICC line. Because it would is not placed here, they insisted that the patient receive an x-ray to verify  placement before they would access it. In the meantime of given him IM medications for pain and nausea. Plan to evaluate with imaging as well as following up on his blood tests once there retrieved. Patient is afebrile. He does not appear septic, just in mild to moderate distress due to the ongoing pain and dry heaving.        Gavin Pound. Pia Jedlicka, MD 02/09/12 2050

## 2012-02-09 NOTE — ED Notes (Signed)
Admitting MD remains at [facility].

## 2012-02-09 NOTE — ED Notes (Signed)
Pending arrival of IV team for Type and screen.

## 2012-02-09 NOTE — ED Notes (Signed)
Family at bedside reports that pt is DNR.

## 2012-02-09 NOTE — ED Notes (Signed)
MD at bedside. Doing rectal exam.

## 2012-02-09 NOTE — ED Notes (Signed)
Pt. Currently off the floor at this time.

## 2012-02-09 NOTE — ED Notes (Signed)
EDP at [facility] for rectal exam, pt actively vomiting clear saliva/sputum, exam delayed, meds given, family out of room back into room, IV therapy paged for blood draw.

## 2012-02-10 ENCOUNTER — Encounter (HOSPITAL_COMMUNITY): Payer: Self-pay | Admitting: General Surgery

## 2012-02-10 DIAGNOSIS — K8020 Calculus of gallbladder without cholecystitis without obstruction: Secondary | ICD-10-CM

## 2012-02-10 DIAGNOSIS — K859 Acute pancreatitis without necrosis or infection, unspecified: Secondary | ICD-10-CM

## 2012-02-10 LAB — CBC
Hemoglobin: 8.2 g/dL — ABNORMAL LOW (ref 13.0–17.0)
Platelets: 339 10*3/uL (ref 150–400)
RBC: 2.55 MIL/uL — ABNORMAL LOW (ref 4.22–5.81)
WBC: 5.6 10*3/uL (ref 4.0–10.5)

## 2012-02-10 LAB — VITAMIN B12: Vitamin B-12: 338 pg/mL (ref 211–911)

## 2012-02-10 LAB — ABO/RH: ABO/RH(D): A POS

## 2012-02-10 LAB — TYPE AND SCREEN
ABO/RH(D): A POS
Antibody Screen: NEGATIVE
Unit division: 0

## 2012-02-10 LAB — RETICULOCYTES
RBC.: 2.57 MIL/uL — ABNORMAL LOW (ref 4.22–5.81)
Retic Count, Absolute: 113.1 10*3/uL (ref 19.0–186.0)

## 2012-02-10 LAB — FERRITIN: Ferritin: 654 ng/mL — ABNORMAL HIGH (ref 22–322)

## 2012-02-10 LAB — BASIC METABOLIC PANEL
Chloride: 105 mEq/L (ref 96–112)
GFR calc Af Amer: 80 mL/min — ABNORMAL LOW (ref >90)
Potassium: 4 mEq/L (ref 3.5–5.1)

## 2012-02-10 LAB — HEPARIN LEVEL (UNFRACTIONATED)
Heparin Unfractionated: <0.1 IU/mL — ABNORMAL LOW (ref 0.30–0.70)
Heparin Unfractionated: 0.16 IU/mL — ABNORMAL LOW (ref 0.30–0.70)

## 2012-02-10 LAB — IRON AND TIBC
Saturation Ratios: 53 % (ref 20–55)
UIBC: 83 ug/dL — ABNORMAL LOW (ref 125–400)

## 2012-02-10 MED ORDER — ZIDOVUDINE 100 MG PO CAPS
300.0000 mg | ORAL_CAPSULE | Freq: Two times a day (BID) | ORAL | Status: DC
  Administered 2012-02-10 – 2012-02-14 (×10): 300 mg via ORAL
  Filled 2012-02-10 (×11): qty 3

## 2012-02-10 MED ORDER — CALCIUM CARBONATE-VITAMIN D 500-200 MG-UNIT PO TABS
1.0000 | ORAL_TABLET | Freq: Every day | ORAL | Status: DC
  Filled 2012-02-10: qty 1

## 2012-02-10 MED ORDER — RITONAVIR 100 MG PO TABS
100.0000 mg | ORAL_TABLET | Freq: Two times a day (BID) | ORAL | Status: DC
  Administered 2012-02-10 – 2012-02-14 (×9): 100 mg via ORAL
  Filled 2012-02-10 (×12): qty 1

## 2012-02-10 MED ORDER — DAPTOMYCIN 500 MG IV SOLR: 500.0000 mg | INTRAVENOUS | Status: DC

## 2012-02-10 MED ORDER — CALCIUM-VITAMIN D-VITAMIN K 500-500-40 MG-UNT-MCG PO CHEW: 2.0000 | CHEWABLE_TABLET | ORAL | Status: DC

## 2012-02-10 MED ORDER — VITAMIN B-12 1000 MCG PO TABS
1000.0000 ug | ORAL_TABLET | Freq: Every day | ORAL | Status: DC
  Administered 2012-02-10 – 2012-02-14 (×5): 1000 ug via ORAL
  Filled 2012-02-10 (×5): qty 1

## 2012-02-10 MED ORDER — MIRTAZAPINE 30 MG PO TABS
30.0000 mg | ORAL_TABLET | Freq: Every day | ORAL | Status: DC
  Administered 2012-02-10 – 2012-02-13 (×4): 30 mg via ORAL
  Filled 2012-02-10 (×5): qty 1

## 2012-02-10 MED ORDER — TENOFOVIR DISOPROXIL FUMARATE 300 MG PO TABS
300.0000 mg | ORAL_TABLET | Freq: Every day | ORAL | Status: DC
  Administered 2012-02-10 – 2012-02-14 (×5): 300 mg via ORAL
  Filled 2012-02-10 (×5): qty 1

## 2012-02-10 MED ORDER — CIPROFLOXACIN HCL 500 MG PO TABS
500.0000 mg | ORAL_TABLET | Freq: Two times a day (BID) | ORAL | Status: DC
  Administered 2012-02-10 – 2012-02-14 (×9): 500 mg via ORAL
  Filled 2012-02-10 (×11): qty 1

## 2012-02-10 MED ORDER — DARUNAVIR ETHANOLATE 600 MG PO TABS
300.0000 mg | ORAL_TABLET | Freq: Two times a day (BID) | ORAL | Status: DC
  Filled 2012-02-10 (×2): qty 1

## 2012-02-10 MED ORDER — WARFARIN SODIUM 7.5 MG PO TABS
7.5000 mg | ORAL_TABLET | Freq: Once | ORAL | Status: AC
  Administered 2012-02-10: 7.5 mg via ORAL
  Filled 2012-02-10: qty 1

## 2012-02-10 MED ORDER — DARUNAVIR ETHANOLATE 600 MG PO TABS
600.0000 mg | ORAL_TABLET | Freq: Two times a day (BID) | ORAL | Status: DC
  Administered 2012-02-10 – 2012-02-14 (×8): 600 mg via ORAL
  Filled 2012-02-10 (×10): qty 1

## 2012-02-10 MED ORDER — RALTEGRAVIR POTASSIUM 400 MG PO TABS
400.0000 mg | ORAL_TABLET | Freq: Two times a day (BID) | ORAL | Status: DC
  Administered 2012-02-10 (×2): 400 mg via ORAL
  Filled 2012-02-10 (×3): qty 1

## 2012-02-10 MED ORDER — CALCIUM CARBONATE-VITAMIN D 500-200 MG-UNIT PO TABS
1.0000 | ORAL_TABLET | Freq: Every day | ORAL | Status: DC
  Administered 2012-02-10 – 2012-02-14 (×5): 1 via ORAL
  Filled 2012-02-10 (×6): qty 1

## 2012-02-10 MED ORDER — VALACYCLOVIR HCL 500 MG PO TABS
1000.0000 mg | ORAL_TABLET | Freq: Every day | ORAL | Status: DC
  Administered 2012-02-10 – 2012-02-14 (×5): 1000 mg via ORAL
  Filled 2012-02-10 (×5): qty 2

## 2012-02-10 MED ORDER — PROCHLORPERAZINE MALEATE 10 MG PO TABS
10.0000 mg | ORAL_TABLET | Freq: Four times a day (QID) | ORAL | Status: DC | PRN
  Filled 2012-02-10 (×2): qty 1

## 2012-02-10 MED ORDER — HYDRALAZINE HCL 20 MG/ML IJ SOLN
10.0000 mg | Freq: Four times a day (QID) | INTRAMUSCULAR | Status: DC | PRN
  Administered 2012-02-11 – 2012-02-12 (×3): 10 mg via INTRAVENOUS
  Filled 2012-02-10 (×3): qty 0.5

## 2012-02-10 MED ORDER — SIMETHICONE 80 MG PO CHEW: 80.0000 mg | CHEWABLE_TABLET | Freq: Four times a day (QID) | ORAL | Status: DC | PRN

## 2012-02-10 MED ORDER — AZITHROMYCIN 600 MG PO TABS
1200.0000 mg | ORAL_TABLET | ORAL | Status: DC
  Administered 2012-02-10: 1200 mg via ORAL
  Filled 2012-02-10: qty 2

## 2012-02-10 MED ORDER — BOOST / RESOURCE BREEZE PO LIQD
1.0000 | Freq: Three times a day (TID) | ORAL | Status: DC
  Administered 2012-02-10 – 2012-02-13 (×7): 1 via ORAL

## 2012-02-10 MED ORDER — WARFARIN - PHARMACIST DOSING INPATIENT: Freq: Every day | Status: DC

## 2012-02-10 MED ORDER — SODIUM CHLORIDE 0.9 % IV SOLN
500.0000 mg | INTRAVENOUS | Status: DC
  Administered 2012-02-10 – 2012-02-14 (×4): 500 mg via INTRAVENOUS
  Filled 2012-02-10 (×6): qty 10

## 2012-02-10 MED ORDER — HEPARIN BOLUS VIA INFUSION
1500.0000 [IU] | Freq: Once | INTRAVENOUS | Status: AC
  Administered 2012-02-10: 1500 [IU] via INTRAVENOUS
  Filled 2012-02-10: qty 1500

## 2012-02-10 MED ORDER — DAPSONE 100 MG PO TABS
100.0000 mg | ORAL_TABLET | Freq: Every day | ORAL | Status: DC
  Administered 2012-02-10 – 2012-02-14 (×5): 100 mg via ORAL
  Filled 2012-02-10 (×5): qty 1

## 2012-02-10 MED ORDER — MARAVIROC 150 MG PO TABS
150.0000 mg | ORAL_TABLET | Freq: Two times a day (BID) | ORAL | Status: DC
  Administered 2012-02-10: 150 mg via ORAL
  Filled 2012-02-10 (×3): qty 1

## 2012-02-10 MED ORDER — HEPARIN (PORCINE) IN NACL 100-0.45 UNIT/ML-% IJ SOLN
1450.0000 [IU]/h | INTRAMUSCULAR | Status: DC
  Administered 2012-02-10 (×2): 1250 [IU]/h via INTRAVENOUS
  Administered 2012-02-11: 1450 [IU]/h via INTRAVENOUS
  Filled 2012-02-10 (×4): qty 250

## 2012-02-10 MED ORDER — SODIUM CHLORIDE 0.9 % IJ SOLN
10.0000 mL | INTRAMUSCULAR | Status: DC | PRN
  Administered 2012-02-10: 20 mL
  Administered 2012-02-10 – 2012-02-14 (×10): 10 mL

## 2012-02-10 MED ORDER — SIMETHICONE 80 MG PO CHEW
80.0000 mg | CHEWABLE_TABLET | Freq: Once | ORAL | Status: AC
  Administered 2012-02-10: 80 mg via ORAL
  Filled 2012-02-10: qty 1

## 2012-02-10 MED ORDER — NYSTATIN 100000 UNIT/ML MT SUSP
5.0000 mL | Freq: Four times a day (QID) | OROMUCOSAL | Status: DC
  Administered 2012-02-10 – 2012-02-14 (×15): 500000 [IU] via ORAL
  Filled 2012-02-10 (×20): qty 5

## 2012-02-10 MED ORDER — FOLIC ACID 1 MG PO TABS
1.0000 mg | ORAL_TABLET | Freq: Every day | ORAL | Status: DC
  Administered 2012-02-10 – 2012-02-14 (×5): 1 mg via ORAL
  Filled 2012-02-10 (×5): qty 1

## 2012-02-10 NOTE — Progress Notes (Signed)
Subjective: Still having some epigastric abdominal pain. No other specific complaints.  Objective: Vital signs in last 24 hours: Filed Vitals:   02/10/12 0202 02/10/12 0248 02/10/12 0600 02/10/12 1023  BP: 152/74 151/72 158/78 169/81  Pulse: 80 81 76 75  Temp: 99.4 F (37.4 C) 99.5 F (37.5 C) 98.6 F (37 C) 98.5 F (36.9 C)  TempSrc: Oral Oral Oral Oral  Resp: 20  20 20   Height:      Weight:      SpO2: 100% 100% 100% 100%   Weight change:   Intake/Output Summary (Last 24 hours) at 02/10/12 1028 Last data filed at 02/10/12 0900  Gross per 24 hour  Intake   2320 ml  Output    180 ml  Net   2140 ml    Physical Exam: General: Awake, Oriented, No acute distress. HEENT: EOMI. Neck: Supple CV: S1 and S2 Lungs: Clear to ascultation bilaterally Abdomen: Soft, some mild tenderness to palpation in the epigastric region, Nondistended, +bowel sounds. Ext: Good pulses. Trace edema.  Right knee covered in bandages sutures in place does not appear to be infected, warm to touch and slightly swollen compared to left knee.  Lab Results: Basic Metabolic Panel:  Lab 02/10/12 1610 02/09/12 1825  NA 139 139  K 4.0 4.4  CL 105 104  CO2 26 25  GLUCOSE 89 115*  BUN 10 11  CREATININE 1.24 1.21  CALCIUM 8.8 9.5  MG -- --  PHOS -- --   Liver Function Tests:  Lab 02/09/12 1825  AST 29  ALT 23  ALKPHOS 104  BILITOT 0.3  PROT 7.7  ALBUMIN 3.1*    Lab 02/09/12 1825  LIPASE 76*  AMYLASE --   No results found for this basename: AMMONIA:5 in the last 168 hours CBC:  Lab 02/09/12 1825  WBC 6.0  NEUTROABS 4.2  HGB 7.5*  HCT 21.6*  MCV 95.2  PLT 361   Cardiac Enzymes: No results found for this basename: CKTOTAL:5,CKMB:5,CKMBINDEX:5,TROPONINI:5 in the last 168 hours BNP (last 3 results) No results found for this basename: PROBNP:3 in the last 8760 hours CBG: No results found for this basename: GLUCAP:5 in the last 168 hours No results found for this basename: HGBA1C:5  in the last 72 hours Other Labs: No components found with this basename: POCBNP:3 No results found for this basename: DDIMER:2 in the last 168 hours No results found for this basename: CHOL:2,HDL:2,LDLCALC:2,TRIG:2,CHOLHDL:2,LDLDIRECT:2 in the last 168 hours No results found for this basename: TSH,T4TOTAL,FREET3,T3FREE,FREET4,THYROIDAB in the last 168 hours  Lab 02/10/12 0505  VITAMINB12 --  FOLATE --  FERRITIN --  TIBC --  IRON --  RETICCTPCT 4.4*    Micro Results: Recent Results (from the past 240 hour(s))  MRSA PCR SCREENING     Status: Normal   Collection Time   02/09/12 11:16 PM      Component Value Range Status Comment   MRSA by PCR NEGATIVE  NEGATIVE Final     Studies/Results: Dg Chest 2 View  02/09/2012  *RADIOLOGY REPORT*  Clinical Data: Fever.  Abdominal pain.  Nausea and vomiting.  CHEST - 2 VIEW  Comparison: None.  Findings: Prior sternotomy for aortic valve replacement.  Cardiac silhouette normal in size.  Prominent main pulmonary trunk and left pulmonary artery.  Hilar and mediastinal contours otherwise unremarkable.  Lungs clear.  Bronchovascular markings normal. Pulmonary vascularity normal.  No pneumothorax.  No pleural effusions.  Visualized bony thorax intact.  Left arm PICC tip in the lower SVC  at the cavoatrial junction.  IMPRESSION: No acute cardiopulmonary disease.   Original Report Authenticated By: Hulan Saas, M.D.    US Abdomen Complete  02/09/2012  *RADIOLOGY REPORT*  Clinical Data:  Abdominal pain.  Vomiting.  COMPLETE ABDOMINAL ULTRASOUND  Comparison:  CT scan of the abdomen dated 04/12/2011  Findings:  Gallbladder:  There are several small mobile gallstones. Gallbladder wall is not thickened.  Negative sonographic Murphy's sign.  Common bile duct:  Normal.  3.8 mm maximum diameter.  Liver:  1.3 cm echogenic lesion in the lateral aspect of the left lobe of the liver consistent with a benign hemangioma.  Otherwise normal.  IVC:  Normal.  Pancreas:   Normal.  Spleen:  Normal.7.7 cm in length.  Right Kidney:  Normal.  10.7 cm in length.  Left Kidney:  Normal.  0.3 cm in length.  Abdominal aorta:  No aneurysm.  Maximal diameter 2.2 cm.  Calcified plaque in the mid abdominal aorta.  IMPRESSION: Several small mobile gallstones. Small hemangioma in the left lobe of the liver.   Original Report Authenticated By: Francene Boyers, M.D.     Medications: I have reviewed the patient's current medications. Scheduled Meds:   . azithromycin  1,200 mg Oral Q7 days  . calcium-vitamin D  1 tablet Oral QAC breakfast  . ciprofloxacin  500 mg Oral BID  . dapsone  100 mg Oral Daily  . DAPTOmycin (CUBICIN)  IV  500 mg Intravenous Q24H  . darunavir  300 mg Oral BID WC  . folic acid  1 mg Oral Daily  . [COMPLETED]  HYDROmorphone (DILAUDID) injection  1 mg Intramuscular Once  . [COMPLETED]  HYDROmorphone (DILAUDID) injection  1 mg Intravenous Once  . [COMPLETED]  HYDROmorphone (DILAUDID) injection  1 mg Intravenous Once  . maraviroc  150 mg Oral BID  . [COMPLETED] metoCLOPramide (REGLAN) injection  10 mg Intravenous Once  . mirtazapine  30 mg Oral QHS  . nystatin  5 mL Oral QID  . [COMPLETED] ondansetron (ZOFRAN) IV  4 mg Intravenous Once  . [COMPLETED] ondansetron (ZOFRAN) IV  4 mg Intravenous Once  . [COMPLETED] pantoprazole (PROTONIX) IV  40 mg Intravenous Once  . pantoprazole (PROTONIX) IV  40 mg Intravenous Q12H  . raltegravir  400 mg Oral BID  . ritonavir  100 mg Oral BID WC  . [COMPLETED] simethicone  80 mg Oral Once  . [COMPLETED] sodium chloride  1,000 mL Intravenous Once  . valACYclovir  1,000 mg Oral Daily  . vitamin B-12  1,000 mcg Oral Daily  . zidovudine  300 mg Oral BID  . [DISCONTINUED] calcium-vitamin D  1 tablet Oral QAC breakfast  . [DISCONTINUED] Calcium-Vitamin D-Vitamin K  2 tablet Oral 1 day or 1 dose  . [DISCONTINUED] DAPTOmycin  500 mg Intravenous 1 day or 1 dose  . [DISCONTINUED]  HYDROmorphone (DILAUDID) injection  1 mg  Intravenous Once   Continuous Infusions:   . sodium chloride 100 mL (02/10/12 0739)  . heparin 1,100 Units/hr (02/10/12 0619)   PRN Meds:.acetaminophen, acetaminophen, alum & mag hydroxide-simeth, HYDROmorphone (DILAUDID) injection, ondansetron (ZOFRAN) IV, ondansetron, oxyCODONE, prochlorperazine, sodium chloride, zolpidem, [DISCONTINUED] simethicone  Assessment/Plan: Abdominal pain/nausea/vomiting Likely due to gallstones.  Requested surgical consultation.  Liver function tests normal, do not suspect an obstructive physiology at this time. Lipase mildly elevated likely due to nausea vomiting.  Briefly discussed with Dr. Ewing Schlein, who at this time do not see a role for GI.  Requested surgical consultation for consideration of cholecystectomy.  Patient on  anticoagulation on Coumadin, which will likely need to be reversed in the event of any surgery.  Hypertension Mildly elevated.  Was on hydrochlorothiazide in the past.  Continue to hold.  When necessary hydralazine for systolic blood pressure greater than 160.  HIV/AIDS Patient continued on his home anti-retroviral therapy.  Continue valacyclovir, dapsone, and azithromycin to prevent opportunistic infections.  Patient to continue to followup with infectious diseases at [facility].  Septic arthritis of the right knee At [facility], patient underwent irrigation debridement of right knee, patient was hospitalized from 01/20/2012 to 02/03/2012.  Initially was on vancomycin and ceftriaxone, patient had a rash due to ceftriaxone which was trained to aztreonam.  At the time of discharge patient was discharged on daptomycin and ciprofloxacin until 02/18/2012.  Patient at this time not complaining of any knee pain.  Continue current antibiotic regimen, to followup with orthopedic physician at [facility] as previously scheduled.  Chronic kidney disease stage I/recent history of acute renal failure during hospitalization  at [facility] Recent creatinine peaked at 1.7.  Suspect patient may have underlying chronic kidney disease.  Renal function stable.  Anemia Likely due to chronic disease.  Anemia panel was suggestive of iron deficiency anemia.  Received 1 unit of PRBC on 02/09/2012.  Guaiac stool and if Hemoccult positive will need GI evaluation.  History of mechanical heart valve Continue anticoagulation on Coumadin.  INR subtherapeutic.  Continue heparin.  Depending on general surgery's evaluation may need to hold Coumadin while continuing heparin.  Prophylaxis Heparin drip.  Disposition Pending surgery's evaluation.   LOS: 1 day  Kathrina Crosley A, MD 02/10/2012, 10:28 AM

## 2012-02-10 NOTE — Progress Notes (Signed)
Brandon [name] [id number] Admitted to 5500: 02/10/2012 22:40 Attending Provider: Ron Parker, MD    Brandon [name] is a 46 y.o. male patient admitted from ED awake, alert  & orientated  X 3,  Full Code, VSS - Blood pressure 120/71, pulse 86, temperature 99.4 F (37.4 C), temperature source Oral, resp. rate 18, height 5\' 9"  (1.753 m), weight 54.6 kg (120 lb 5.9 oz), SpO2 100.00%.RA, no c/o shortness of breath, no c/o chest pain, no distress noted.    IV site WDL: with a transparent dsg that's clean dry and intact.  Allergies:   Allergies  Allergen Reactions  . Bee Venom Anaphylaxis  . Sulfa Antibiotics Anaphylaxis  . Truvada (Emtricitabine-Tenofovir) Anaphylaxis  . Lidoderm (Lidocaine) Other (See Comments)    Reaction unknown     Past Medical History  Diagnosis Date  . HIV (human immunodeficiency virus infection)   . Hypertension   . Stroke   . Pancreatitis   . Mechanical heart valve present   . Arthritis     History:  obtained from patient and wife  Pt orientation to unit, room and routine. Information packet given to patient/family and safety video refused.  Admission INP armband ID verified with patient/family, and in place. SR up x 2, fall risk assessment complete with Patient and family verbalizing understanding of risks associated with falls. Pt verbalizes an understanding of how to use the call bell and to call for help before getting out of bed.  Skin, clean-dry- intact without evidence of bruising, or skin tears.   No evidence of skin break down noted on exam.  Patient has sutures on right knee covered with gauze dressing which is clean, dry, and intact.    Will cont to monitor and assist as needed.  Brandon Ponder, RN 02/10/2012 1:38 AM

## 2012-02-10 NOTE — Progress Notes (Signed)
ANTICOAGULATION CONSULT NOTE  Pharmacy Consult for heparin Indication: hx of AVR  Allergies  Allergen Reactions  . Bee Venom Anaphylaxis  . Sulfa Antibiotics Anaphylaxis  . Truvada (Emtricitabine-Tenofovir) Anaphylaxis  . Lidoderm (Lidocaine) Other (See Comments)    Reaction unknown    Patient Measurements: Height: 5\' 9"  (175.3 cm) Weight: 120 lb 5.9 oz (54.6 kg) IBW/kg (Calculated) : 70.7  Heparin Dosing Weight: 58 kg  Vital Signs: Temp: 98.6 F (37 C) (12/02 1453) Temp src: Oral (12/02 1453) BP: 156/88 mmHg (12/02 1453) Pulse Rate: 77  (12/02 1453)  Labs:  Basename 02/10/12 1420 02/10/12 0930 02/10/12 0505 02/09/12 1825  HGB -- 8.2* -- 7.5*  HCT -- 23.8* -- 21.6*  PLT -- 339 -- 361  APTT -- -- -- --  LABPROT -- -- -- 16.9*  INR -- -- -- 1.41  HEPARINUNFRC 0.21* -- <0.10* --  CREATININE -- -- 1.24 1.21  CKTOTAL -- -- -- --  CKMB -- -- -- --  TROPONINI -- -- -- --    Estimated Creatinine Clearance: 58.7 ml/min (by C-G formula based on Cr of 1.24).  Assessment: 46 yo male with hx of AVR. Patient is noted with pancreatitits an possible surgery (noted GI to see). Coumadin on hold and INR subtherapeutic, on Heparin and level is below goal (HL=0.21).   Goal of Therapy:  Heparin level 0.3-0.7 units/ml Monitor platelets by anticoagulation protocol: Yes   Plan:   -Increase heparin to 1250 units/hr -Heparin level in 6hrs  Harland German, Pharm D 02/10/2012 3:10 PM

## 2012-02-10 NOTE — Progress Notes (Signed)
ANTICOAGULATION CONSULT NOTE - Initial Consult  Pharmacy Consult for Coumadin Indication: AVR  Allergies  Allergen Reactions  . Bee Venom Anaphylaxis  . Sulfa Antibiotics Anaphylaxis  . Truvada (Emtricitabine-Tenofovir) Anaphylaxis  . Lidoderm (Lidocaine) Other (See Comments)    Reaction unknown    Patient Measurements: Height: 5\' 9"  (175.3 cm) Weight: 120 lb 5.9 oz (54.6 kg) IBW/kg (Calculated) : 70.7  Heparin Dosing Weight:    Vital Signs: Temp: 98.6 F (37 C) (12/02 1453) Temp src: Oral (12/02 1453) BP: 156/88 mmHg (12/02 1453) Pulse Rate: 77  (12/02 1453)  Labs:  Basename 02/10/12 1420 02/10/12 0930 02/10/12 0505 02/09/12 1825  HGB -- 8.2* -- 7.5*  HCT -- 23.8* -- 21.6*  PLT -- 339 -- 361  APTT -- -- -- --  LABPROT -- -- -- 16.9*  INR -- -- -- 1.41  HEPARINUNFRC 0.21* -- <0.10* --  CREATININE -- -- 1.24 1.21  CKTOTAL -- -- -- --  CKMB -- -- -- --  TROPONINI -- -- -- --    Estimated Creatinine Clearance: 58.7 ml/min (by C-G formula based on Cr of 1.24).   Medical History: Past Medical History  Diagnosis Date  . HIV (human immunodeficiency virus infection)   . Hypertension   . Stroke   . Pancreatitis   . Mechanical heart valve present   . Arthritis     Medications:  Prescriptions prior to admission  Medication Sig Dispense Refill  . azithromycin (ZITHROMAX) 600 MG tablet Take 1,200 mg by mouth every 7 (seven) days.       . Calcium-Vitamin D-Vitamin K (VIACTIV) 500-500-40 MG-UNT-MCG CHEW Chew 2 tablets by mouth Daily. Take 2 tablets by mouth daily.      . ciprofloxacin (CIPRO) 500 MG tablet Take 500 mg by mouth Every 12 hours. Take 1 tablet (500 mg total) by mouth every 12 hours for 15 days. Last dose on 02/18/12.      . dapsone 100 MG tablet Take 100 mg by mouth daily.      Marland Kitchen DAPTOmycin (CUBICIN) 500 MG injection Inject 500 mg into the vein Daily. Infuse 500 mg into the vein daily for 15 doses. Last dose on 02/18/12.      Marland Kitchen darunavir (PREZISTA) 600  MG tablet Take 600 mg by mouth 2 (two) times daily with a meal.      . dronabinol (MARINOL) 2.5 MG capsule Take 1 capsule by mouth BID times 48H. TAKE ONE CAPSULE BY MOUTH TWICE A DAY      . EPINEPHrine (EPIPEN IJ) Inject 1 application as directed once as needed. For sever allergic reaction      . folic acid (FOLVITE) 1 MG tablet Take 1 mg by mouth daily.      Marland Kitchen ibuprofen (ADVIL,MOTRIN) 800 MG tablet Take 800 mg by mouth every 8 (eight) hours as needed. For pain      . mirtazapine (REMERON) 30 MG tablet Take 30 mg by mouth at bedtime.      Marland Kitchen nystatin (MYCOSTATIN) 100000 UNIT/ML suspension Take 5 mLs by mouth 4 times daily. Take 5 mLs (500,000 Units total) by mouth 4 times daily for 10 days. (swish and swallow each dose)      . ondansetron (ZOFRAN) 4 MG tablet Take 4 mg by mouth every 8 (eight) hours as needed. For nausea      . oxyCODONE (ROXICODONE) 15 MG immediate release tablet Take 15 mg by mouth Every 8 hours as needed. Take 1 tablet (15 mg total) by mouth every  8 (eight) hours as needed for 30 doses.      . [EXPIRED] oxyCODONE-acetaminophen (PERCOCET/ROXICET) 5-325 MG per tablet Take 1 tablet by mouth every 4 (four) hours as needed. For pain      . pantoprazole (PROTONIX) 20 MG tablet Take 40 mg by mouth daily.      . prochlorperazine (COMPAZINE) 10 MG tablet Take 10 mg by mouth every 6 (six) hours as needed. For nausea      . ritonavir (NORVIR) 100 MG capsule Take 100 mg by mouth 2 (two) times daily.      Marland Kitchen tenofovir (VIREAD) 300 MG tablet Take 300 mg by mouth daily.      . valACYclovir (VALTREX) 1000 MG tablet Take 1,000 mg by mouth daily.      . vitamin B-12 (CYANOCOBALAMIN) 1000 MCG tablet Take 1,000 mcg by mouth daily.      Marland Kitchen warfarin (COUMADIN) 1 MG tablet Take 4 mg by mouth daily.      . zidovudine (RETROVIR) 300 MG tablet Take 300 mg by mouth 2 (two) times daily.        Assessment: 46 yo male with hx of AVR on Coumadin PTA. Admitted with INR 1.41 on 4mg  daily. Patient having  recurrent pancreatitis but AST/ALT ok. Patient indecisive about surgery at this point so MD ordered to resume Coumadin   Goal of Therapy:  INR 2.5-3.5 Monitor platelets by anticoagulation protocol: Yes   Plan:  Coumadin 7.5mg  po x 1 tonight.  Merilynn Finland, [employer] 02/10/2012,7:36 PM

## 2012-02-10 NOTE — Progress Notes (Signed)
ANTICOAGULATION CONSULT NOTE  Pharmacy Consult for heparin Indication: hx of AVR  Allergies  Allergen Reactions  . Bee Venom Anaphylaxis  . Sulfa Antibiotics Anaphylaxis  . Truvada (Emtricitabine-Tenofovir) Anaphylaxis  . Lidoderm (Lidocaine) Other (See Comments)    Reaction unknown    Patient Measurements: Height: 5\' 9"  (175.3 cm) Weight: 120 lb 5.9 oz (54.6 kg) IBW/kg (Calculated) : 70.7  Heparin Dosing Weight: 58 kg  Vital Signs: Temp: 98.8 F (37.1 C) (12/02 2146) Temp src: Oral (12/02 2146) BP: 164/91 mmHg (12/02 2146) Pulse Rate: 81  (12/02 2146)  Labs:  Basename 02/10/12 2156 02/10/12 1420 02/10/12 0930 02/10/12 0505 02/09/12 1825  HGB -- -- 8.2* -- 7.5*  HCT -- -- 23.8* -- 21.6*  PLT -- -- 339 -- 361  APTT -- -- -- -- --  LABPROT -- -- -- -- 16.9*  INR -- -- -- -- 1.41  HEPARINUNFRC 0.16* 0.21* -- <0.10* --  CREATININE -- -- -- 1.24 1.21  CKTOTAL -- -- -- -- --  CKMB -- -- -- -- --  TROPONINI -- -- -- -- --    Estimated Creatinine Clearance: 58.7 ml/min (by C-G formula based on Cr of 1.24).  Assessment: 46 yo male with hx of AVR on Coumadin PTA. Admitted with INR 1.41 on 4mg  daily. Patient having recurrent pancreatitis but AST/ALT ok. Patient indecisive about surgery at this point so MD ordered to resume Coumadin. Heparin level less than previous at 0.16.  Goal of Therapy:  Heparin level 0.3-0.7 units/ml Monitor platelets by anticoagulation protocol: Yes   Plan:   Bolus heparin 1500 units and increase infusion to 1450 units/hr Recheck heparin level in am.   Sanita Estrada S. Merilynn Finland, PharmD, [facility] Clinical Staff Pharmacist Pager [phone]   02/10/2012 10:37 PM

## 2012-02-10 NOTE — Consult Note (Signed)
Brandon [name] February 19, 1966  [id number].   Primary Care MD: [facility] Requesting MD: Dr. Betti Cruz Chief Complaint/Reason for Consult: abdominal pain, pancreatitis HPI: This is a 46 yo male with h/o pancreatitis, HIV, and CVA.  He began having pain yesterday morning with some nausea and emesis.  His wife states his pain was mid abdomen and on the left side.  Apparently, he goes to the ED about every 2 months with similar symptoms.  He was found to have a lipase here of 76 and was admitted for pancreatitis.  He also has anemia and was just dc from [facility] after having septic arthritis of his left knee.  He has no pain with eating, no hematochezia, or hematemesis.  After admission, he had an U/S that revealed gallstones.  We have been asked to see for possible cholecystectomy.  Review of Systems: Please see HPI, otherwise all other systems are negative.  Family History  Problem Relation Age of Onset  . Hypertension Father   . Hypertension Sister   . Diabetes Maternal Aunt   . Cancer - Prostate Father   . Cancer - Other Cousin   . Parkinson's disease Paternal Aunt     Past Medical History  Diagnosis Date  . HIV (human immunodeficiency virus infection)   . Hypertension   . Stroke   . Pancreatitis   . Mechanical heart valve present   . Arthritis     Past Surgical History  Procedure Date  . Cardiac surgery   . Knee surgery     Social History:  reports that he has been smoking.  He does not have any smokeless tobacco history on file. He reports that he drinks alcohol. He reports that he uses illicit drugs (Marijuana).  Allergies:  Allergies  Allergen Reactions  . Bee Venom Anaphylaxis  . Sulfa Antibiotics Anaphylaxis  . Truvada (Emtricitabine-Tenofovir) Anaphylaxis  . Lidoderm (Lidocaine) Other (See Comments)    Reaction unknown    Medications Prior to Admission  Medication Sig Dispense Refill  . azithromycin (ZITHROMAX) 600 MG tablet Take 1,200 mg by mouth every 7 (seven) days.        . Calcium-Vitamin D-Vitamin K (VIACTIV) 500-500-40 MG-UNT-MCG CHEW Chew 2 tablets by mouth Daily. Take 2 tablets by mouth daily.      . ciprofloxacin (CIPRO) 500 MG tablet Take 500 mg by mouth Every 12 hours. Take 1 tablet (500 mg total) by mouth every 12 hours for 15 days. Last dose on 02/18/12.      . dapsone 100 MG tablet Take 100 mg by mouth daily.      Marland Kitchen DAPTOmycin (CUBICIN) 500 MG injection Inject 500 mg into the vein Daily. Infuse 500 mg into the vein daily for 15 doses. Last dose on 02/18/12.      Marland Kitchen darunavir (PREZISTA) 600 MG tablet Take 600 mg by mouth 2 (two) times daily with a meal.      . dronabinol (MARINOL) 2.5 MG capsule Take 1 capsule by mouth BID times 48H. TAKE ONE CAPSULE BY MOUTH TWICE A DAY      . EPINEPHrine (EPIPEN IJ) Inject 1 application as directed once as needed. For sever allergic reaction      . folic acid (FOLVITE) 1 MG tablet Take 1 mg by mouth daily.      Marland Kitchen ibuprofen (ADVIL,MOTRIN) 800 MG tablet Take 800 mg by mouth every 8 (eight) hours as needed. For pain      . mirtazapine (REMERON) 30 MG tablet Take 30 mg by mouth at  bedtime.      Marland Kitchen nystatin (MYCOSTATIN) 100000 UNIT/ML suspension Take 5 mLs by mouth 4 times daily. Take 5 mLs (500,000 Units total) by mouth 4 times daily for 10 days. (swish and swallow each dose)      . ondansetron (ZOFRAN) 4 MG tablet Take 4 mg by mouth every 8 (eight) hours as needed. For nausea      . oxyCODONE (ROXICODONE) 15 MG immediate release tablet Take 15 mg by mouth Every 8 hours as needed. Take 1 tablet (15 mg total) by mouth every 8 (eight) hours as needed for 30 doses.      . [EXPIRED] oxyCODONE-acetaminophen (PERCOCET/ROXICET) 5-325 MG per tablet Take 1 tablet by mouth every 4 (four) hours as needed. For pain      . pantoprazole (PROTONIX) 20 MG tablet Take 40 mg by mouth daily.      . prochlorperazine (COMPAZINE) 10 MG tablet Take 10 mg by mouth every 6 (six) hours as needed. For nausea      . ritonavir (NORVIR) 100 MG capsule  Take 100 mg by mouth 2 (two) times daily.      Marland Kitchen tenofovir (VIREAD) 300 MG tablet Take 300 mg by mouth daily.      . valACYclovir (VALTREX) 1000 MG tablet Take 1,000 mg by mouth daily.      . vitamin B-12 (CYANOCOBALAMIN) 1000 MCG tablet Take 1,000 mcg by mouth daily.      Marland Kitchen warfarin (COUMADIN) 1 MG tablet Take 4 mg by mouth daily.      . zidovudine (RETROVIR) 300 MG tablet Take 300 mg by mouth 2 (two) times daily.        Blood pressure 169/81, pulse 75, temperature 98.5 F (36.9 C), temperature source Oral, resp. rate 20, height 5\' 9"  (1.753 m), weight 120 lb 5.9 oz (54.6 kg), SpO2 100.00%. Physical Exam: General: skinny black male who is laying in bed in NAD HEENT: head is normocephalic, atraumatic.  Sclera are noninjected.  PERRL.  Ears and nose without any masses or lesions.  Mouth is pink and moist Heart: regular, rate, and rhythm.  Normal s1,s2. No obvious murmurs, gallops, or rubs noted, mechanical click noted.  Palpable radial and pedal pulses bilaterally Lungs: CTAB, no wheezes, rhonchi, or rales noted.  Respiratory effort nonlabored Abd: soft, tender in LUQ, ND, +BS, no masses, hernias, or organomegaly MS: all 4 extremities are symmetrical with no cyanosis, clubbing, or edema. Skin: warm and dry with no masses, lesions, or rashes Psych: A&Ox3 with a flat affect.    Results for orders placed during the hospital encounter of 02/09/12 (from the past 48 hour(s))  COMPREHENSIVE METABOLIC PANEL     Status: Abnormal   Collection Time   02/09/12  6:25 PM      Component Value Range Comment   Sodium 139  135 - 145 mEq/L    Potassium 4.4  3.5 - 5.1 mEq/L    Chloride 104  96 - 112 mEq/L    CO2 25  19 - 32 mEq/L    Glucose, Bld 115 (*) 70 - 99 mg/dL    BUN 11  6 - 23 mg/dL    Creatinine, Ser 1.61  0.50 - 1.35 mg/dL    Calcium 9.5  8.4 - 09.6 mg/dL    Total Protein 7.7  6.0 - 8.3 g/dL    Albumin 3.1 (*) 3.5 - 5.2 g/dL    AST 29  0 - 37 U/L    ALT 23  0 -  53 U/L    Alkaline  Phosphatase 104  39 - 117 U/L    Total Bilirubin 0.3  0.3 - 1.2 mg/dL    GFR calc non Af Amer 71 (*) >90 mL/min    GFR calc Af Amer 83 (*) >90 mL/min   CBC WITH DIFFERENTIAL     Status: Abnormal   Collection Time   02/09/12  6:25 PM      Component Value Range Comment   WBC 6.0  4.0 - 10.5 K/uL    RBC 2.27 (*) 4.22 - 5.81 MIL/uL    Hemoglobin 7.5 (*) 13.0 - 17.0 g/dL    HCT 16.1 (*) 09.6 - 52.0 %    MCV 95.2  78.0 - 100.0 fL    MCH 33.0  26.0 - 34.0 pg    MCHC 34.7  30.0 - 36.0 g/dL    RDW 04.5 (*) 40.9 - 15.5 %    Platelets 361  150 - 400 K/uL    Neutrophils Relative 71  43 - 77 %    Lymphocytes Relative 20  12 - 46 %    Monocytes Relative 8  3 - 12 %    Eosinophils Relative 0  0 - 5 %    Basophils Relative 1  0 - 1 %    Neutro Abs 4.2  1.7 - 7.7 K/uL    Lymphs Abs 1.2  0.7 - 4.0 K/uL    Monocytes Absolute 0.5  0.1 - 1.0 K/uL    Eosinophils Absolute 0.0  0.0 - 0.7 K/uL    Basophils Absolute 0.1  0.0 - 0.1 K/uL    RBC Morphology POLYCHROMASIA PRESENT   TARGET CELLS  LIPASE, BLOOD     Status: Abnormal   Collection Time   02/09/12  6:25 PM      Component Value Range Comment   Lipase 76 (*) 11 - 59 U/L   PROTIME-INR     Status: Abnormal   Collection Time   02/09/12  6:25 PM      Component Value Range Comment   Prothrombin Time 16.9 (*) 11.6 - 15.2 seconds    INR 1.41  0.00 - 1.49   URINALYSIS, MICROSCOPIC ONLY     Status: Normal   Collection Time   02/09/12  8:00 PM      Component Value Range Comment   Color, Urine YELLOW  YELLOW    APPearance CLEAR  CLEAR    Specific Gravity, Urine 1.017  1.005 - 1.030    pH 7.0  5.0 - 8.0    Glucose, UA NEGATIVE  NEGATIVE mg/dL    Hgb urine dipstick NEGATIVE  NEGATIVE    Bilirubin Urine NEGATIVE  NEGATIVE    Ketones, ur NEGATIVE  NEGATIVE mg/dL    Protein, ur NEGATIVE  NEGATIVE mg/dL    Urobilinogen, UA 0.2  0.0 - 1.0 mg/dL    Nitrite NEGATIVE  NEGATIVE    Leukocytes, UA NEGATIVE  NEGATIVE    WBC, UA 0-2  <3 WBC/hpf    Squamous  Epithelial / LPF RARE  RARE   OCCULT BLOOD, POC DEVICE     Status: Normal   Collection Time   02/09/12  8:49 PM      Component Value Range Comment   Fecal Occult Bld NEGATIVE     TYPE AND SCREEN     Status: Normal   Collection Time   02/09/12  9:10 PM      Component Value Range Comment   ABO/RH(D) A POS  Antibody Screen NEG      Sample Expiration 02/12/2012      Unit Number [id number]      Blood Component Type RED CELLS,LR      Unit division 00      Status of Unit ISSUED,FINAL      Transfusion Status OK TO TRANSFUSE      Crossmatch Result Compatible     ABO/RH     Status: Normal   Collection Time   02/09/12  9:10 PM      Component Value Range Comment   ABO/RH(D) A POS     PREPARE RBC (CROSSMATCH)     Status: Normal   Collection Time   02/09/12  9:10 PM      Component Value Range Comment   Order Confirmation ORDER PROCESSED BY BLOOD BANK     MRSA PCR SCREENING     Status: Normal   Collection Time   02/09/12 11:16 PM      Component Value Range Comment   MRSA by PCR NEGATIVE  NEGATIVE   BASIC METABOLIC PANEL     Status: Abnormal   Collection Time   02/10/12  5:05 AM      Component Value Range Comment   Sodium 139  135 - 145 mEq/L    Potassium 4.0  3.5 - 5.1 mEq/L    Chloride 105  96 - 112 mEq/L    CO2 26  19 - 32 mEq/L    Glucose, Bld 89  70 - 99 mg/dL    BUN 10  6 - 23 mg/dL    Creatinine, Ser 3.08  0.50 - 1.35 mg/dL    Calcium 8.8  8.4 - 65.7 mg/dL    GFR calc non Af Amer 69 (*) >90 mL/min    GFR calc Af Amer 80 (*) >90 mL/min   HEPARIN LEVEL (UNFRACTIONATED)     Status: Abnormal   Collection Time   02/10/12  5:05 AM      Component Value Range Comment   Heparin Unfractionated <0.10 (*) 0.30 - 0.70 IU/mL   FERRITIN     Status: Abnormal   Collection Time   02/10/12  5:05 AM      Component Value Range Comment   Ferritin 654 (*) 22 - 322 ng/mL   IRON AND TIBC     Status: Abnormal   Collection Time   02/10/12  5:05 AM      Component Value Range Comment   Iron 95   42 - 135 ug/dL    TIBC 846 (*) 962 - 952 ug/dL    Saturation Ratios 53  20 - 55 %    UIBC 83 (*) 125 - 400 ug/dL   RETICULOCYTES     Status: Abnormal   Collection Time   02/10/12  5:05 AM      Component Value Range Comment   Retic Ct Pct 4.4 (*) 0.4 - 3.1 %    RBC. 2.57 (*) 4.22 - 5.81 MIL/uL    Retic Count, Manual 113.1  19.0 - 186.0 K/uL   VITAMIN B12     Status: Normal   Collection Time   02/10/12  5:05 AM      Component Value Range Comment   Vitamin B-12 338  211 - 911 pg/mL   CBC     Status: Abnormal   Collection Time   02/10/12  9:30 AM      Component Value Range Comment   WBC 5.6  4.0 - 10.5 K/uL  RBC 2.55 (*) 4.22 - 5.81 MIL/uL    Hemoglobin 8.2 (*) 13.0 - 17.0 g/dL    HCT 09.8 (*) 11.9 - 52.0 %    MCV 93.3  78.0 - 100.0 fL    MCH 32.2  26.0 - 34.0 pg    MCHC 34.5  30.0 - 36.0 g/dL    RDW 14.7 (*) 82.9 - 15.5 %    Platelets 339  150 - 400 K/uL    Dg Chest 2 View  02/09/2012  *RADIOLOGY REPORT*  Clinical Data: Fever.  Abdominal pain.  Nausea and vomiting.  CHEST - 2 VIEW  Comparison: None.  Findings: Prior sternotomy for aortic valve replacement.  Cardiac silhouette normal in size.  Prominent main pulmonary trunk and left pulmonary artery.  Hilar and mediastinal contours otherwise unremarkable.  Lungs clear.  Bronchovascular markings normal. Pulmonary vascularity normal.  No pneumothorax.  No pleural effusions.  Visualized bony thorax intact.  Left arm PICC tip in the lower SVC at the cavoatrial junction.  IMPRESSION: No acute cardiopulmonary disease.   Original Report Authenticated By: Hulan Saas, M.D.    US Abdomen Complete  02/09/2012  *RADIOLOGY REPORT*  Clinical Data:  Abdominal pain.  Vomiting.  COMPLETE ABDOMINAL ULTRASOUND  Comparison:  CT scan of the abdomen dated 04/12/2011  Findings:  Gallbladder:  There are several small mobile gallstones. Gallbladder wall is not thickened.  Negative sonographic Murphy's sign.  Common bile duct:  Normal.  3.8 mm maximum  diameter.  Liver:  1.3 cm echogenic lesion in the lateral aspect of the left lobe of the liver consistent with a benign hemangioma.  Otherwise normal.  IVC:  Normal.  Pancreas:  Normal.  Spleen:  Normal.7.7 cm in length.  Right Kidney:  Normal.  10.7 cm in length.  Left Kidney:  Normal.  0.3 cm in length.  Abdominal aorta:  No aneurysm.  Maximal diameter 2.2 cm.  Calcified plaque in the mid abdominal aorta.  IMPRESSION: Several small mobile gallstones. Small hemangioma in the left lobe of the liver.   Original Report Authenticated By: Francene Boyers, M.D.        Assessment/Plan 1. Mild acute on chronic pancreatitis 2. Cholelithiasis Patient Active Problem List  Diagnosis  . Acute pancreatitis  . Nausea & vomiting  . Abdominal pain  . Anemia  . S/P AVR (aortic valve replacement)  . Septic arthritis of knee  . Warfarin-induced coagulopathy  . HIV disease   Plan: 1. The patient gets all of his care at [facility] and is unsure whether he wants to have surgery here or not.  He and his wife are discussing this.  The patient does drink ETOH and this could be the cause of his pancreatitis.  He does have gallstones, but unsure if this is the clear cause of his recurrent pancreatitis.  We could proceed with a lap chole, with no guarantee that this will fix his recurrent pancreatitis.  i d/w the patient and his wife who understand that.  He also may be getting an upper and lower endo by GI.  If he does, and we proceed with surgery, we would likely wait until after the endos so if something were found we could address at the same time.  We will follow.  Chenoa Luddy E 02/10/2012, 1:43 PM Pager: [phone]

## 2012-02-10 NOTE — Progress Notes (Addendum)
INITIAL ADULT NUTRITION ASSESSMENT Date: 02/10/2012   Time: 12:51 PM Reason for Assessment: Health History   INTERVENTION: 1. Resource Breeze po TID, each supplement provides 250 kcal and 9 grams of protein. To maximize kcal and protein intake while on clear liquid diet.   2. RD will continue to follow     DOCUMENTATION CODES Per approved criteria  -Underweight    ASSESSMENT: Male 46 y.o.  Dx: Acute pancreatitis  Hx:  Past Medical History  Diagnosis Date  . HIV (human immunodeficiency virus infection)   . Hypertension   . Stroke   . Pancreatitis   . Mechanical heart valve present   . Arthritis     Past Surgical History  Procedure Date  . Cardiac surgery      Related Meds:     . azithromycin  1,200 mg Oral Q7 days  . calcium-vitamin D  1 tablet Oral QAC breakfast  . ciprofloxacin  500 mg Oral BID  . dapsone  100 mg Oral Daily  . DAPTOmycin (CUBICIN)  IV  500 mg Intravenous Q24H  . darunavir  600 mg Oral BID WC  . folic acid  1 mg Oral Daily  . [COMPLETED]  HYDROmorphone (DILAUDID) injection  1 mg Intramuscular Once  . [COMPLETED]  HYDROmorphone (DILAUDID) injection  1 mg Intravenous Once  . [COMPLETED]  HYDROmorphone (DILAUDID) injection  1 mg Intravenous Once  . [COMPLETED] metoCLOPramide (REGLAN) injection  10 mg Intravenous Once  . mirtazapine  30 mg Oral QHS  . nystatin  5 mL Oral QID  . [COMPLETED] ondansetron (ZOFRAN) IV  4 mg Intravenous Once  . [COMPLETED] ondansetron (ZOFRAN) IV  4 mg Intravenous Once  . [COMPLETED] pantoprazole (PROTONIX) IV  40 mg Intravenous Once  . pantoprazole (PROTONIX) IV  40 mg Intravenous Q12H  . ritonavir  100 mg Oral BID WC  . [COMPLETED] simethicone  80 mg Oral Once  . [COMPLETED] sodium chloride  1,000 mL Intravenous Once  . tenofovir  300 mg Oral Daily  . valACYclovir  1,000 mg Oral Daily  . vitamin B-12  1,000 mcg Oral Daily  . zidovudine  300 mg Oral BID  . [DISCONTINUED] calcium-vitamin D  1 tablet Oral QAC  breakfast  . [DISCONTINUED] Calcium-Vitamin D-Vitamin K  2 tablet Oral 1 day or 1 dose  . [DISCONTINUED] DAPTOmycin  500 mg Intravenous 1 day or 1 dose  . [DISCONTINUED] darunavir  300 mg Oral BID WC  . [DISCONTINUED]  HYDROmorphone (DILAUDID) injection  1 mg Intravenous Once  . [DISCONTINUED] maraviroc  150 mg Oral BID  . [DISCONTINUED] raltegravir  400 mg Oral BID     Ht: 5\' 9"  (175.3 cm)  Wt: 120 lb 5.9 oz (54.6 kg)  Ideal Wt: 73 kg  % Ideal Wt: 75%  Usual Wt: 125 lbs per pt report  Wt Readings from Last 5 Encounters:  02/09/12 120 lb 5.9 oz (54.6 kg)    % Usual Wt: 96%  Body mass index is 17.78 kg/(m^2). Pt is underweight   Food/Nutrition Related Hx: no reported weight loss or decrease in appetite per MST (Malnutrition Screening Tool)   Labs:  CMP     Component Value Date/Time   NA 139 02/10/2012 0505   K 4.0 02/10/2012 0505   CL 105 02/10/2012 0505   CO2 26 02/10/2012 0505   GLUCOSE 89 02/10/2012 0505   BUN 10 02/10/2012 0505   CREATININE 1.24 02/10/2012 0505   CALCIUM 8.8 02/10/2012 0505   PROT 7.7 02/09/2012 1825  ALBUMIN 3.1* 02/09/2012 1825   AST 29 02/09/2012 1825   ALT 23 02/09/2012 1825   ALKPHOS 104 02/09/2012 1825   BILITOT 0.3 02/09/2012 1825   GFRNONAA 69* 02/10/2012 0505   GFRAA 80* 02/10/2012 0505      Intake/Output Summary (Last 24 hours) at 02/10/12 1254 Last data filed at 02/10/12 0900  Gross per 24 hour  Intake   2320 ml  Output    180 ml  Net   2140 ml     Diet Order: Clear Liquid  Supplements/Tube Feeding: none   IVF:    sodium chloride Last Rate: 100 mL (02/10/12 0739)  heparin Last Rate: 1,100 Units/hr (02/10/12 0619)    Estimated Nutritional Needs:   Kcal: 1800-2000 Protein: 75-85  gm  Fluid:  1.8-2 L   Pt admitted with N/V and abd pain, pt has hx of pancreatitis.  Possible surgery for gallbladder removal? U/S showed gallstones.  Pt states he has not been eating well, was recently in the hospital and did not like most of the  foods provided. Thinks he has lost about 5 lbs, but unsure of the time frame.   NUTRITION DIAGNOSIS: Inadequate oral intake r/t poor appetite and dislike of meals provided AEB weight loss of 4% body weight in unknown time frame.   MONITORING/EVALUATION(Goals): Goal: diet advance to meet >/=90% estimated nutrition needs  Monitor: PO intake, weight, labs  EDUCATION NEEDS: -No education needs identified at this time    Clarene Duke RD, LDN Pager [phone] After Hours pager [phone]  02/10/2012, 12:51 PM

## 2012-02-10 NOTE — Progress Notes (Signed)
ART consult:  Pt with advanced HIV. From discharge note from [facility], pt with CD4 around 40 from last admission. He has a history of resistance virus including integrase inhibitor. He is also not a candidate for maraviroc. 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 The ARTs on his medrec is not consistent with his discharged meds from [facility]. I discussed the case with Dr. Betti Cruz. Will change to the meds listed on his last admission from [facility].  Plan  Darunavir 600mg  PO BID Ritonavir 100mg  PO BID Tenofovir 300mg  PO Qday Zidovudine 300mg  PO BID Dapsone 100mg  PO qday Azithromycin 1200mg  PO Qweek

## 2012-02-10 NOTE — Progress Notes (Signed)
ANTICOAGULATION CONSULT NOTE  Pharmacy Consult for heparin Indication: hx of AVR  Allergies  Allergen Reactions  . Bee Venom Anaphylaxis  . Sulfa Antibiotics Anaphylaxis  . Truvada (Emtricitabine-Tenofovir) Anaphylaxis  . Lidoderm (Lidocaine) Other (See Comments)    Reaction unknown    Patient Measurements: Height: 5\' 9"  (175.3 cm) Weight: 120 lb 5.9 oz (54.6 kg) IBW/kg (Calculated) : 70.7  Heparin Dosing Weight: 58 kg  Vital Signs: Temp: 98.6 F (37 C) (12/02 0600) Temp src: Oral (12/02 0600) BP: 158/78 mmHg (12/02 0600) Pulse Rate: 76  (12/02 0600)  Labs:  Basename 02/10/12 0505 02/09/12 1825  HGB -- 7.5*  HCT -- 21.6*  PLT -- 361  APTT -- --  LABPROT -- 16.9*  INR -- 1.41  HEPARINUNFRC <0.10* --  CREATININE -- 1.21  CKTOTAL -- --  CKMB -- --  TROPONINI -- --    Estimated Creatinine Clearance: 60.2 ml/min (by C-G formula based on Cr of 1.21).  Assessment: 46 yo male with hx of AVR, Coumadin on hold and INR subtherapeutic, for Heparin.   Goal of Therapy:  Heparin level 0.3-0.7 units/ml Monitor platelets by anticoagulation protocol: Yes   Plan:  Increase Heparin 1100 units/hr Check heparin level in 8 hours.  Eddie Candle 02/10/2012,6:01 AM

## 2012-02-10 NOTE — Consult Note (Signed)
The patient has a history of pancreatitis.  Whether this has been HIV drug related or EtOH related, or secondary to gallstones, cholecystectomy would be indicated in the presence of gallstones and pancreatitis in a pt who is surgical candidate. Reviewed lap chole with patient. Likely, lipase would be normalized in AM.  Pt already feeling some better compared to admission.   Pt to decide if wants chole here or not.    If pancreatitis truly secondary to his gallstones, he has 40% chance of recurrent pancreatitis over next few months.

## 2012-02-11 DIAGNOSIS — B20 Human immunodeficiency virus [HIV] disease: Secondary | ICD-10-CM

## 2012-02-11 LAB — COMPREHENSIVE METABOLIC PANEL
ALT: 17 U/L (ref 0–53)
AST: 24 U/L (ref 0–37)
Alkaline Phosphatase: 86 U/L (ref 39–117)
CO2: 25 mEq/L (ref 19–32)
Calcium: 8.3 mg/dL — ABNORMAL LOW (ref 8.4–10.5)
Chloride: 106 mEq/L (ref 96–112)
GFR calc non Af Amer: 69 mL/min — ABNORMAL LOW (ref >90)
Potassium: 4 mEq/L (ref 3.5–5.1)
Sodium: 139 mEq/L (ref 135–145)
Total Bilirubin: 0.3 mg/dL (ref 0.3–1.2)

## 2012-02-11 LAB — PROTIME-INR: Prothrombin Time: 17 seconds — ABNORMAL HIGH (ref 11.6–15.2)

## 2012-02-11 LAB — CBC
Platelets: 312 10*3/uL (ref 150–400)
RBC: 2.63 MIL/uL — ABNORMAL LOW (ref 4.22–5.81)
WBC: 5.3 10*3/uL (ref 4.0–10.5)

## 2012-02-11 LAB — HEPARIN LEVEL (UNFRACTIONATED): Heparin Unfractionated: 0.32 IU/mL (ref 0.30–0.70)

## 2012-02-11 LAB — SURGICAL PCR SCREEN
MRSA, PCR: NEGATIVE
Staphylococcus aureus: NEGATIVE

## 2012-02-11 LAB — OCCULT BLOOD X 1 CARD TO LAB, STOOL: Fecal Occult Bld: NEGATIVE

## 2012-02-11 MED ORDER — LEVOFLOXACIN IN D5W 750 MG/150ML IV SOLN
750.0000 mg | Freq: Once | INTRAVENOUS | Status: AC
  Administered 2012-02-12: .75 g via INTRAVENOUS
  Filled 2012-02-11 (×3): qty 150

## 2012-02-11 MED ORDER — HYDROCHLOROTHIAZIDE 25 MG PO TABS
25.0000 mg | ORAL_TABLET | Freq: Every day | ORAL | Status: DC
  Administered 2012-02-11 – 2012-02-14 (×4): 25 mg via ORAL
  Filled 2012-02-11 (×4): qty 1

## 2012-02-11 NOTE — Progress Notes (Signed)
Patient blood pressure is 178/88, after patient is not tremoring. Hydralazine to be given. Will continue to monitor.   Moo Gravley J. Lendell Caprice RN BSN

## 2012-02-11 NOTE — Care Management Note (Addendum)
    Page 1 of 2   02/14/2012     2:06:10 PM   CARE MANAGEMENT NOTE 02/14/2012  Patient:  Brandon [name], Brandon [name]   Account Number:  [id number]  Date Initiated:  02/11/2012  Documentation initiated by:  Letha Cape  Subjective/Objective Assessment:   dx acute pancreatitis  admit- lives with spouse.  Active with [facility] for [facility] for IV ABX     Action/Plan:   HHRN with IV ABX with [facility]   Anticipated DC Date:  02/14/2012   Anticipated DC Plan:  HOME W HOME HEALTH SERVICES      DC Planning Services  CM consult      [facility] Choice  HOME HEALTH  Resumption Of Svcs/PTA Provider   Choice offered to / List presented to:  C-1 Patient   DME arranged  [employer]      DME agency  [facility]     [facility] arranged  HH-1 RN      [facility] agency  [facility]   Status of service:  Completed, signed off Medicare Important Message given?   (If response is "NO", the following Medicare IM given date fields will be blank) Date Medicare IM given:   Date Additional Medicare IM given:    Discharge Disposition:  HOME W HOME HEALTH SERVICES  Per UR Regulation:  Reviewed for med. necessity/level of care/duration of stay  If discussed at Long Length of Stay Meetings, dates discussed:    Comments:  02/14/12 14:00 Letha Cape RN, BSN [phone] pt for dc todlay, he will go home on lovenox, he has medicaid which covers this and he goes to [facility] on [address],  I called to see if they have the lovenox 80mg  and they do have them, informed patient, also [facility] informed of pt being dc today and needing pt/inr checked on Monday.  Shower chair was delivered to the patient's room yesterday.  02/13/12 10:57 Letha Cape RN, BSN [phone] patient for  possible dc today, patient is active with [facility] for [facility] for IV abx, and Lupita Leash with [facility] notified of possibility of dc today or tomorrow.  Patient has medication coverage and transportation at discharge.  02/11/12 14:25 Letha Cape RN,  BSN [phone] patient lives with spouse.  Surgery following, plan for lap chole, NCM will continue to follow for dc needs.

## 2012-02-11 NOTE — Progress Notes (Signed)
Will plan lap chole with [facility] tomorrow. Will hold heparin 8 hours ahead.

## 2012-02-11 NOTE — Progress Notes (Signed)
Patient is complaining of fever and chills that his wife states is associated with severe pain. Patients temperature was checked and it was 98.5. Pain medicine is due at 950pm. Was given early due to patient shaking in pain. Patients blood pressure is elevated, but patient was shaking and trembling when medicine was given. Will recheck since pain meds were given to have a more accurate reading. Will continue to monitor.  Adelise Buswell J. Lendell Caprice RN BSN

## 2012-02-11 NOTE — Progress Notes (Signed)
ANTICOAGULATION CONSULT NOTE  Pharmacy Consult for heparin Indication: hx of AVR  Allergies  Allergen Reactions  . Bee Venom Anaphylaxis  . Sulfa Antibiotics Anaphylaxis  . Truvada (Emtricitabine-Tenofovir) Anaphylaxis  . Lidoderm (Lidocaine) Other (See Comments)    Reaction unknown   Patient Measurements: Height: 5\' 9"  (175.3 cm) Weight: 120 lb 5.9 oz (54.6 kg) IBW/kg (Calculated) : 70.7  Heparin Dosing Weight: 58 kg  Vital Signs: Temp: 99 F (37.2 C) (12/03 0452) Temp src: Oral (12/03 0452) BP: 170/80 mmHg (12/03 0700) Pulse Rate: 80  (12/03 0452)  Labs:  Basename 02/11/12 0510 02/10/12 2156 02/10/12 1420 02/10/12 0930 02/10/12 0505 02/09/12 1825  HGB 8.6* -- -- 8.2* -- --  HCT 25.1* -- -- 23.8* -- 21.6*  PLT 312 -- -- 339 -- 361  APTT -- -- -- -- -- --  LABPROT 17.0* -- -- -- -- 16.9*  INR 1.42 -- -- -- -- 1.41  HEPARINUNFRC 0.32 0.16* 0.21* -- -- --  CREATININE 1.25 -- -- -- 1.24 1.21  CKTOTAL 150 -- -- -- -- --  CKMB -- -- -- -- -- --  TROPONINI -- -- -- -- -- --   Estimated Creatinine Clearance: 58.2 ml/min (by C-G formula based on Cr of 1.25).  Assessment: 46 yo male with hx of AVR on Coumadin PTA. Patient having recurrent pancreatitis but AST/ALT ok. Patient indecisive about surgery at this point so MD ordered to resume Coumadin. Heparin level is within goal range today at 0.32 IU/ml on IV heparin rate of 1450 units/hr.  CBC remains low but stable with no noted bleeding complications.  Platelets are 312K.  INR still at 1.42, with dose resumed yesterday.  Dose of 7.5mg  charted as given at ~ 21:00PM and labs drawn early this AM, thus full effect not yet seen.  HOME dose:  Warfarin 4 mg daily - sub-therapeutic INR on admit (1.4).  Goal of Therapy:  Heparin level 0.3-0.7 units/ml Monitor platelets by anticoagulation protocol: Yes   Plan:    Continue IV heparin infusion at 1450 units/hr  Recheck heparin level in am.  Warfarin 7.5 mg x 1  Check PT/INR in  am and adjust   Nadara Mustard, PharmD., MS Clinical Pharmacist Pager:  [phone] Thank you for allowing pharmacy to be part of this patients care team.  02/11/2012 8:18 AM

## 2012-02-11 NOTE — Progress Notes (Signed)
Pt had an elevated bp of 226/132. RN paged  MD on call awaiting further orders.

## 2012-02-11 NOTE — Progress Notes (Signed)
[facility]  Patient Status: Active (receiving services up to time of hospitalization)  [facility] is providing the following services: RN and Home Infusion Services (teaching and education will be done by nurse in the home with patient and caregiver)  If patient discharges after hours, please call [phone].   Brandon [name] 02/11/2012, 5:03 PM

## 2012-02-11 NOTE — Progress Notes (Signed)
Subjective: Abdominal pain improved but still persistent. Wants to have his diet advanced for today.  Objective: Vital signs in last 24 hours: Filed Vitals:   02/10/12 2146 02/11/12 0008 02/11/12 0452 02/11/12 0700  BP:  166/96 183/74 170/80  Pulse: 81 86 80   Temp: 98.8 F (37.1 C) 99 F (37.2 C) 99 F (37.2 C)   TempSrc: Oral Oral Oral   Resp: 18 18 18    Height:      Weight:      SpO2: 98% 98% 97%    Weight change:   Intake/Output Summary (Last 24 hours) at 02/11/12 1052 Last data filed at 02/11/12 0837  Gross per 24 hour  Intake 3923.65 ml  Output      0 ml  Net 3923.65 ml    Physical Exam: General: Awake, Oriented, No acute distress. HEENT: EOMI. Neck: Supple CV: S1 and S2 Lungs: Clear to ascultation bilaterally Abdomen: Soft, some mild tenderness to palpation in the epigastric region, Nondistended, +bowel sounds. Ext: Good pulses. Trace edema.  Right knee covered in bandages sutures in place does not appear to be infected, warm to touch and slightly swollen compared to left knee.  Lab Results: Basic Metabolic Panel:  Lab 02/11/12 1610 02/10/12 0505 02/09/12 1825  NA 139 139 139  K 4.0 4.0 4.4  CL 106 105 104  CO2 25 26 25   GLUCOSE 74 89 115*  BUN 7 10 11   CREATININE 1.25 1.24 1.21  CALCIUM 8.3* 8.8 9.5  MG -- -- --  PHOS -- -- --   Liver Function Tests:  Lab 02/11/12 0510 02/09/12 1825  AST 24 29  ALT 17 23  ALKPHOS 86 104  BILITOT 0.3 0.3  PROT 6.6 7.7  ALBUMIN 2.5* 3.1*    Lab 02/11/12 0510 02/09/12 1825  LIPASE 81* 76*  AMYLASE -- --   No results found for this basename: AMMONIA:5 in the last 168 hours CBC:  Lab 02/11/12 0510 02/10/12 0930 02/09/12 1825  WBC 5.3 5.6 6.0  NEUTROABS -- -- 4.2  HGB 8.6* 8.2* 7.5*  HCT 25.1* 23.8* 21.6*  MCV 95.4 93.3 95.2  PLT 312 339 361   Cardiac Enzymes:  Lab 02/11/12 0510  CKTOTAL 150  CKMB --  CKMBINDEX --  TROPONINI --   BNP (last 3 results) No results found for this basename: PROBNP:3  in the last 8760 hours CBG: No results found for this basename: GLUCAP:5 in the last 168 hours No results found for this basename: HGBA1C:5 in the last 72 hours Other Labs: No components found with this basename: POCBNP:3 No results found for this basename: DDIMER:2 in the last 168 hours No results found for this basename: CHOL:2,HDL:2,LDLCALC:2,TRIG:2,CHOLHDL:2,LDLDIRECT:2 in the last 168 hours No results found for this basename: TSH,T4TOTAL,FREET3,T3FREE,FREET4,THYROIDAB in the last 168 hours  Lab 02/10/12 0505  VITAMINB12 338  FOLATE 8.4  FERRITIN 654*  TIBC 178*  IRON 95  RETICCTPCT 4.4*    Micro Results: Recent Results (from the past 240 hour(s))  MRSA PCR SCREENING     Status: Normal   Collection Time   02/09/12 11:16 PM      Component Value Range Status Comment   MRSA by PCR NEGATIVE  NEGATIVE Final     Studies/Results: Dg Chest 2 View  02/09/2012  *RADIOLOGY REPORT*  Clinical Data: Fever.  Abdominal pain.  Nausea and vomiting.  CHEST - 2 VIEW  Comparison: None.  Findings: Prior sternotomy for aortic valve replacement.  Cardiac silhouette normal in size.  Prominent  main pulmonary trunk and left pulmonary artery.  Hilar and mediastinal contours otherwise unremarkable.  Lungs clear.  Bronchovascular markings normal. Pulmonary vascularity normal.  No pneumothorax.  No pleural effusions.  Visualized bony thorax intact.  Left arm PICC tip in the lower SVC at the cavoatrial junction.  IMPRESSION: No acute cardiopulmonary disease.   Original Report Authenticated By: Hulan Saas, M.D.    US Abdomen Complete  02/09/2012  *RADIOLOGY REPORT*  Clinical Data:  Abdominal pain.  Vomiting.  COMPLETE ABDOMINAL ULTRASOUND  Comparison:  CT scan of the abdomen dated 04/12/2011  Findings:  Gallbladder:  There are several small mobile gallstones. Gallbladder wall is not thickened.  Negative sonographic Murphy's sign.  Common bile duct:  Normal.  3.8 mm maximum diameter.  Liver:  1.3 cm echogenic  lesion in the lateral aspect of the left lobe of the liver consistent with a benign hemangioma.  Otherwise normal.  IVC:  Normal.  Pancreas:  Normal.  Spleen:  Normal.7.7 cm in length.  Right Kidney:  Normal.  10.7 cm in length.  Left Kidney:  Normal.  0.3 cm in length.  Abdominal aorta:  No aneurysm.  Maximal diameter 2.2 cm.  Calcified plaque in the mid abdominal aorta.  IMPRESSION: Several small mobile gallstones. Small hemangioma in the left lobe of the liver.   Original Report Authenticated By: Francene Boyers, M.D.     Medications: I have reviewed the patient's current medications. Scheduled Meds:    . azithromycin  1,200 mg Oral Q7 days  . calcium-vitamin D  1 tablet Oral QAC breakfast  . ciprofloxacin  500 mg Oral BID  . dapsone  100 mg Oral Daily  . DAPTOmycin (CUBICIN)  IV  500 mg Intravenous Q24H  . darunavir  600 mg Oral BID WC  . feeding supplement  1 Container Oral TID BM  . folic acid  1 mg Oral Daily  . [COMPLETED] heparin  1,500 Units Intravenous Once  . levofloxacin (LEVAQUIN) IV  750 mg Intravenous Once  . mirtazapine  30 mg Oral QHS  . nystatin  5 mL Oral QID  . pantoprazole (PROTONIX) IV  40 mg Intravenous Q12H  . ritonavir  100 mg Oral BID WC  . tenofovir  300 mg Oral Daily  . valACYclovir  1,000 mg Oral Daily  . vitamin B-12  1,000 mcg Oral Daily  . [COMPLETED] warfarin  7.5 mg Oral ONCE-1800  . zidovudine  300 mg Oral BID  . [DISCONTINUED] darunavir  300 mg Oral BID WC  . [DISCONTINUED] maraviroc  150 mg Oral BID  . [DISCONTINUED] raltegravir  400 mg Oral BID  . [DISCONTINUED] Warfarin - Pharmacist Dosing Inpatient   Does not apply q1800   Continuous Infusions:    . sodium chloride 100 mL/hr at 02/11/12 0524  . heparin 1,450 Units/hr (02/10/12 2251)  . [DISCONTINUED] heparin 1,100 Units/hr (02/10/12 0619)   PRN Meds:.acetaminophen, acetaminophen, alum & mag hydroxide-simeth, hydrALAZINE, HYDROmorphone (DILAUDID) injection, ondansetron (ZOFRAN) IV,  ondansetron, oxyCODONE, prochlorperazine, sodium chloride, zolpidem  Assessment/Plan: Abdominal pain/nausea/vomiting (chronic pancreatitis) Likely due to gallstones.  Appreciate surgery's input, plan for surgery tomorrow for cholecystectomy. Liver function tests normal, do not suspect an obstructive physiology at this time. 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  Briefly discussed with Dr. Ewing Schlein on 02/10/2012, who at this time do not see a role for GI.  INR subtherapeutic, hold coumadin if PT/INR above 1.5 may need FFP prior to surgery.  Hypertension Continues to be eleavted.  Was on hydrochlorothiazide in the past, start HCTZ 25 mg daily.  When necessary hydralazine for systolic blood pressure greater than 160.  HIV/AIDS Patient continued on his home anti-retroviral therapy.  Continue valacyclovir, dapsone, and azithromycin to prevent opportunistic infections.  Patient to continue to followup with infectious diseases at [facility].  Septic arthritis of the right knee [facility], patient underwent irrigation debridement of right knee, patient was hospitalized from 01/20/2012 to 02/03/2012.  Initially was on vancomycin and ceftriaxone, patient had a rash due to ceftriaxone which was trained to aztreonam.  At the time of discharge patient was discharged on daptomycin and ciprofloxacin until 02/18/2012.  Patient at this time not complaining of any knee pain.  Continue current antibiotic regimen, to followup with orthopedic physician at [facility] as previously scheduled.  Chronic kidney disease stage I/recent history of acute renal failure during hospitalization at [facility] Recent creatinine peaked at 1.7.  Suspect patient may have underlying chronic kidney disease.  Renal function stable.  Anemia Likely due to chronic disease.  Anemia panel was suggestive of iron  deficiency anemia.  Received 1 unit of PRBC on 02/09/2012.  Guaiac stool and if Hemoccult positive will need GI evaluation.  History of mechanical heart valve Continue anticoagulation on Coumadin.  INR subtherapeutic.  Continue heparin.  Hold coumadin for surgery tomorrow, restart coumadin after surgery. Will also need to have heparin restarted after surgery when deemed safe by general surgery.  Prophylaxis Heparin drip.  Disposition Pending.   LOS: 2 days  Raphaela Cannaday A, MD 02/11/2012, 10:52 AM

## 2012-02-11 NOTE — Progress Notes (Signed)
Subjective: Generally doing ok ready to proceed with lap chole as per previous discussion with Dr. Donell Beers.  Objective: Vital signs in last 24 hours: Temp:  [98.5 F (36.9 C)-99 F (37.2 C)] 99 F (37.2 C) (12/03 0452) Pulse Rate:  [75-86] 80  (12/03 0452) Resp:  [18-20] 18  (12/03 0452) BP: (156-183)/(74-96) 170/80 mmHg (12/03 0700) SpO2:  [95 %-100 %] 97 % (12/03 0452) Last BM Date: 02/10/12  Intake/Output from previous day: 12/02 0701 - 12/03 0700 In: 4393.7 [P.O.:840; I.V.:3433.7; IV Piggyback:120] Out: -  Intake/Output this shift: Total I/O In: 10 [I.V.:10] Out: -   General appearance: alert, cooperative, appears stated age, fatigued and no distress Chest: CTA Cardiac: RRR Abdomen: RUQ and mid abdominal tenderness No N/V/D tolerating clears Extremities: thin, warm to touch, no edema or tenderness, Right knee bandaged from previous effusion drainage. Labs: WBC wnl, H&H stable, remains on heparin drip; INR 1.42 PLTs stable. BUN improved Creatine unchanged essentially 1.25 vs 1.24.  Lab Results:   Basename 02/11/12 0510 02/10/12 0930  WBC 5.3 5.6  HGB 8.6* 8.2*  HCT 25.1* 23.8*  PLT 312 339   BMET  Basename 02/11/12 0510 02/10/12 0505  NA 139 139  K 4.0 4.0  CL 106 105  CO2 25 26  GLUCOSE 74 89  BUN 7 10  CREATININE 1.25 1.24  CALCIUM 8.3* 8.8   PT/INR  Basename 02/11/12 0510 02/09/12 1825  LABPROT 17.0* 16.9*  INR 1.42 1.41   ABG No results found for this basename: PHART:2,PCO2:2,PO2:2,HCO3:2 in the last 72 hours  Studies/Results: Dg Chest 2 View  02/09/2012  *RADIOLOGY REPORT*  Clinical Data: Fever.  Abdominal pain.  Nausea and vomiting.  CHEST - 2 VIEW  Comparison: None.  Findings: Prior sternotomy for aortic valve replacement.  Cardiac silhouette normal in size.  Prominent main pulmonary trunk and left pulmonary artery.  Hilar and mediastinal contours otherwise unremarkable.  Lungs clear.  Bronchovascular markings normal. Pulmonary vascularity  normal.  No pneumothorax.  No pleural effusions.  Visualized bony thorax intact.  Left arm PICC tip in the lower SVC at the cavoatrial junction.  IMPRESSION: No acute cardiopulmonary disease.   Original Report Authenticated By: Hulan Saas, M.D.    US Abdomen Complete  02/09/2012  *RADIOLOGY REPORT*  Clinical Data:  Abdominal pain.  Vomiting.  COMPLETE ABDOMINAL ULTRASOUND  Comparison:  CT scan of the abdomen dated 04/12/2011  Findings:  Gallbladder:  There are several small mobile gallstones. Gallbladder wall is not thickened.  Negative sonographic Murphy's sign.  Common bile duct:  Normal.  3.8 mm maximum diameter.  Liver:  1.3 cm echogenic lesion in the lateral aspect of the left lobe of the liver consistent with a benign hemangioma.  Otherwise normal.  IVC:  Normal.  Pancreas:  Normal.  Spleen:  Normal.7.7 cm in length.  Right Kidney:  Normal.  10.7 cm in length.  Left Kidney:  Normal.  0.3 cm in length.  Abdominal aorta:  No aneurysm.  Maximal diameter 2.2 cm.  Calcified plaque in the mid abdominal aorta.  IMPRESSION: Several small mobile gallstones. Small hemangioma in the left lobe of the liver.   Original Report Authenticated By: Francene Boyers, M.D.     Anti-infectives: Anti-infectives     Start     Dose/Rate Route Frequency Ordered Stop   02/12/12 0000   levofloxacin (LEVAQUIN) IVPB 750 mg     Comments: Please give on call to operating room.   Thank you      750 mg  100 mL/hr over 90 Minutes Intravenous  Once 02/11/12 1015     02/10/12 1700   darunavir (PREZISTA) tablet 300 mg  Status:  Discontinued        300 mg Oral 2 times daily with meals 02/10/12 1027 02/10/12 1057   02/10/12 1700   darunavir (PREZISTA) tablet 600 mg        600 mg Oral 2 times daily with meals 02/10/12 1058     02/10/12 1400   DAPTOmycin (CUBICIN) 500 mg in sodium chloride 0.9 % IVPB        500 mg 220 mL/hr over 30 Minutes Intravenous Every 24 hours 02/10/12 0233 02/18/12 1359   02/10/12 1200   tenofovir  (VIREAD) tablet 300 mg        300 mg Oral Daily 02/10/12 1058     02/10/12 1000   dapsone tablet 100 mg        100 mg Oral Daily 02/10/12 0108     02/10/12 1000   valACYclovir (VALTREX) tablet 1,000 mg        1,000 mg Oral Daily 02/10/12 0108     02/10/12 1000   azithromycin (ZITHROMAX) tablet 1,200 mg        1,200 mg Oral Every 7 days 02/10/12 0108     02/10/12 0800   ciprofloxacin (CIPRO) tablet 500 mg        500 mg Oral 2 times daily 02/10/12 0107 02/18/12 0759   02/10/12 0800   ritonavir (NORVIR) capsule 100 mg        100 mg Oral 2 times daily with meals 02/10/12 0107     02/10/12 0200   zidovudine (RETROVIR) capsule 300 mg        300 mg Oral 2 times daily 02/10/12 0107     02/10/12 0200   raltegravir (ISENTRESS) tablet 400 mg  Status:  Discontinued        400 mg Oral 2 times daily 02/10/12 0107 02/10/12 1057   02/10/12 0200   maraviroc (SELZENTRY) tablet 150 mg  Status:  Discontinued        150 mg Oral 2 times daily 02/10/12 0107 02/10/12 1057   02/10/12 0115   DAPTOmycin (CUBICIN) injection 500 mg  Status:  Discontinued        500 mg Intravenous 1 Day/Dose 02/10/12 0108 02/10/12 0233          Assessment/Plan:  Patient Active Problem List  Diagnosis  . Acute pancreatitis  . Nausea & vomiting  . Abdominal pain  . Anemia  . S/P AVR (aortic valve replacement)  . Septic arthritis of knee  . Warfarin-induced coagulopathy  . HIV disease   s/p * No surgery found *  Plan: 1. Stop Heparin drip at 0400 am 02/12/12 in anticipation of lap chole. 2. NPO after midnight for same. 3. Patient will receive 750mg  Levaquin as a pre-op abx   LOS: 2 days    Golda Acre [facility] Pager # [phone]  02/11/2012

## 2012-02-12 ENCOUNTER — Encounter (HOSPITAL_COMMUNITY)
Admission: EM | Disposition: A | Discharge status: Home Health | Payer: Self-pay | Source: Home / Self Care | Attending: Internal Medicine

## 2012-02-12 ENCOUNTER — Encounter (HOSPITAL_COMMUNITY): Payer: Self-pay | Admitting: Anesthesiology

## 2012-02-12 ENCOUNTER — Inpatient Hospital Stay (HOSPITAL_COMMUNITY): Payer: Medicaid Other

## 2012-02-12 ENCOUNTER — Inpatient Hospital Stay (HOSPITAL_COMMUNITY): Payer: Medicaid Other | Admitting: Anesthesiology

## 2012-02-12 DIAGNOSIS — K8010 Calculus of gallbladder with chronic cholecystitis without obstruction: Secondary | ICD-10-CM

## 2012-02-12 HISTORY — PX: CHOLECYSTECTOMY: SHX55

## 2012-02-12 LAB — BASIC METABOLIC PANEL
BUN: 5 mg/dL — ABNORMAL LOW (ref 6–23)
Calcium: 9.1 mg/dL (ref 8.4–10.5)
Creatinine, Ser: 1.28 mg/dL (ref 0.50–1.35)
GFR calc Af Amer: 77 mL/min — ABNORMAL LOW (ref >90)
GFR calc non Af Amer: 67 mL/min — ABNORMAL LOW (ref >90)

## 2012-02-12 LAB — CBC
Hemoglobin: 8.7 g/dL — ABNORMAL LOW (ref 13.0–17.0)
MCH: 32.6 pg (ref 26.0–34.0)
MCHC: 34 g/dL (ref 30.0–36.0)
Platelets: 274 10*3/uL (ref 150–400)
RDW: 18.7 % — ABNORMAL HIGH (ref 11.5–15.5)

## 2012-02-12 LAB — PROTIME-INR
INR: 1.54 — ABNORMAL HIGH (ref 0.00–1.49)
Prothrombin Time: 18 seconds — ABNORMAL HIGH (ref 11.6–15.2)

## 2012-02-12 LAB — HEPARIN LEVEL (UNFRACTIONATED): Heparin Unfractionated: 0.12 IU/mL — ABNORMAL LOW (ref 0.30–0.70)

## 2012-02-12 SURGERY — LAPAROSCOPIC CHOLECYSTECTOMY
Anesthesia: General | Site: Abdomen | Wound class: Clean Contaminated

## 2012-02-12 MED ORDER — TENOFOVIR DISOPROXIL FUMARATE 300 MG PO TABS: 300.0000 mg | ORAL_TABLET | Freq: Every day | ORAL | Status: DC

## 2012-02-12 MED ORDER — FENTANYL CITRATE 0.05 MG/ML IJ SOLN
INTRAMUSCULAR | Status: DC | PRN
  Administered 2012-02-12: 100 ug via INTRAVENOUS
  Administered 2012-02-12: 50 ug via INTRAVENOUS
  Administered 2012-02-12: 100 ug via INTRAVENOUS

## 2012-02-12 MED ORDER — DARUNAVIR ETHANOLATE 600 MG PO TABS: 600.0000 mg | ORAL_TABLET | Freq: Two times a day (BID) | ORAL | Status: DC

## 2012-02-12 MED ORDER — HYDROMORPHONE HCL PF 1 MG/ML IJ SOLN
0.2500 mg | INTRAMUSCULAR | Status: DC | PRN
  Administered 2012-02-12 (×4): 0.5 mg via INTRAVENOUS

## 2012-02-12 MED ORDER — HYDRALAZINE HCL 20 MG/ML IJ SOLN
10.0000 mg | Freq: Four times a day (QID) | INTRAMUSCULAR | Status: DC | PRN
  Filled 2012-02-12: qty 0.5

## 2012-02-12 MED ORDER — ARTIFICIAL TEARS OP OINT
TOPICAL_OINTMENT | OPHTHALMIC | Status: DC | PRN
  Administered 2012-02-12: 1 via OPHTHALMIC

## 2012-02-12 MED ORDER — ONDANSETRON HCL 4 MG/2ML IJ SOLN
INTRAMUSCULAR | Status: DC | PRN
  Administered 2012-02-12: 4 mg via INTRAVENOUS
  Administered 2012-02-12: 4 mg

## 2012-02-12 MED ORDER — HEPARIN (PORCINE) IN NACL 100-0.45 UNIT/ML-% IJ SOLN
1550.0000 [IU]/h | INTRAMUSCULAR | Status: DC
  Administered 2012-02-12: 1450 [IU]/h via INTRAVENOUS
  Administered 2012-02-13 – 2012-02-14 (×2): 1550 [IU]/h via INTRAVENOUS
  Filled 2012-02-12 (×4): qty 250

## 2012-02-12 MED ORDER — SODIUM CHLORIDE 0.9 % IR SOLN
Status: DC | PRN
  Administered 2012-02-12: 1000 mL

## 2012-02-12 MED ORDER — HYDROMORPHONE HCL PF 1 MG/ML IJ SOLN
INTRAMUSCULAR | Status: AC
  Administered 2012-02-13: 2 mg via INTRAVENOUS
  Filled 2012-02-12: qty 1

## 2012-02-12 MED ORDER — 0.9 % SODIUM CHLORIDE (POUR BTL) OPTIME
TOPICAL | Status: DC | PRN
  Administered 2012-02-12: 1000 mL

## 2012-02-12 MED ORDER — OXYCODONE HCL 5 MG PO TABS
5.0000 mg | ORAL_TABLET | ORAL | Status: DC | PRN
  Administered 2012-02-12: 5 mg via ORAL
  Filled 2012-02-12: qty 2

## 2012-02-12 MED ORDER — LIDOCAINE HCL (PF) 1 % IJ SOLN
INTRAMUSCULAR | Status: AC
  Filled 2012-02-12: qty 30

## 2012-02-12 MED ORDER — LIDOCAINE HCL 1 % IJ SOLN
INTRAMUSCULAR | Status: DC | PRN
  Administered 2012-02-12: 14:00:00

## 2012-02-12 MED ORDER — LACTATED RINGERS IV SOLN: INTRAVENOUS | Status: DC | PRN

## 2012-02-12 MED ORDER — PROPOFOL 10 MG/ML IV BOLUS
INTRAVENOUS | Status: DC | PRN
  Administered 2012-02-12: 350 mg via INTRAVENOUS

## 2012-02-12 MED ORDER — ONDANSETRON HCL 4 MG/2ML IJ SOLN
INTRAMUSCULAR | Status: AC
  Administered 2012-02-13: 4 mg via INTRAVENOUS
  Filled 2012-02-12: qty 2

## 2012-02-12 MED ORDER — ONDANSETRON HCL 4 MG/2ML IJ SOLN
4.0000 mg | Freq: Once | INTRAMUSCULAR | Status: DC | PRN
  Filled 2012-02-12: qty 2

## 2012-02-12 MED ORDER — LIDOCAINE HCL (CARDIAC) 20 MG/ML IV SOLN
INTRAVENOUS | Status: DC | PRN
  Administered 2012-02-12: 50 mg via INTRAVENOUS

## 2012-02-12 MED ORDER — GLYCOPYRROLATE 0.2 MG/ML IJ SOLN
INTRAMUSCULAR | Status: DC | PRN
  Administered 2012-02-12: .8 mg via INTRAVENOUS

## 2012-02-12 MED ORDER — BUPIVACAINE-EPINEPHRINE PF 0.25-1:200000 % IJ SOLN
INTRAMUSCULAR | Status: AC
  Filled 2012-02-12: qty 30

## 2012-02-12 MED ORDER — MIDAZOLAM HCL 5 MG/5ML IJ SOLN
INTRAMUSCULAR | Status: DC | PRN
  Administered 2012-02-12: 2 mg via INTRAVENOUS

## 2012-02-12 MED ORDER — PHENOL 1.4 % MT LIQD
1.0000 | OROMUCOSAL | Status: DC | PRN
  Administered 2012-02-12: 1 via OROMUCOSAL
  Filled 2012-02-12 (×2): qty 177

## 2012-02-12 MED ORDER — VECURONIUM BROMIDE 10 MG IV SOLR
INTRAVENOUS | Status: DC | PRN
  Administered 2012-02-12: 6 mg via INTRAVENOUS

## 2012-02-12 MED ORDER — NEOSTIGMINE METHYLSULFATE 1 MG/ML IJ SOLN
INTRAMUSCULAR | Status: DC | PRN
  Administered 2012-02-12: 4 mg via INTRAVENOUS

## 2012-02-12 MED ORDER — PROPRANOLOL HCL 1 MG/ML IV SOLN
INTRAVENOUS | Status: DC | PRN
  Administered 2012-02-12: .5 mg via INTRAVENOUS

## 2012-02-12 MED ORDER — SODIUM CHLORIDE 0.9 % IV SOLN
INTRAVENOUS | Status: DC | PRN
  Administered 2012-02-12: 14:00:00

## 2012-02-12 SURGICAL SUPPLY — 52 items
APPLIER CLIP ROT 10 11.4 M/L (STAPLE) ×2
BLADE SURG ROTATE 9660 (MISCELLANEOUS) ×2 IMPLANT
CANISTER SUCTION 2500CC (MISCELLANEOUS) ×2 IMPLANT
CHLORAPREP W/TINT 26ML (MISCELLANEOUS) ×2 IMPLANT
CLIP APPLIE ROT 10 11.4 M/L (STAPLE) ×1 IMPLANT
CLOTH BEACON ORANGE TIMEOUT ST (SAFETY) ×2 IMPLANT
COVER MAYO STAND STRL (DRAPES) ×2 IMPLANT
COVER SURGICAL LIGHT HANDLE (MISCELLANEOUS) ×2 IMPLANT
DECANTER SPIKE VIAL GLASS SM (MISCELLANEOUS) ×4 IMPLANT
DERMABOND ADHESIVE PROPEN (GAUZE/BANDAGES/DRESSINGS) ×1
DERMABOND ADVANCED (GAUZE/BANDAGES/DRESSINGS) ×1
DERMABOND ADVANCED .7 DNX12 (GAUZE/BANDAGES/DRESSINGS) ×1 IMPLANT
DERMABOND ADVANCED .7 DNX6 (GAUZE/BANDAGES/DRESSINGS) ×1 IMPLANT
DRAPE C-ARM 42X72 X-RAY (DRAPES) ×2 IMPLANT
DRAPE UTILITY 15X26 W/TAPE STR (DRAPE) ×4 IMPLANT
DRAPE WARM FLUID 44X44 (DRAPE) ×2 IMPLANT
ELECT REM PT RETURN 9FT ADLT (ELECTROSURGICAL) ×2
ELECTRODE REM PT RTRN 9FT ADLT (ELECTROSURGICAL) ×1 IMPLANT
FILTER SMOKE EVAC LAPAROSHD (FILTER) IMPLANT
GLOVE BIO SURGEON STRL SZ 6 (GLOVE) ×2 IMPLANT
GLOVE BIO SURGEON STRL SZ7.5 (GLOVE) ×2 IMPLANT
GLOVE BIOGEL PI IND STRL 6.5 (GLOVE) ×2 IMPLANT
GLOVE BIOGEL PI IND STRL 7.0 (GLOVE) ×1 IMPLANT
GLOVE BIOGEL PI IND STRL 8.5 (GLOVE) ×1 IMPLANT
GLOVE BIOGEL PI INDICATOR 6.5 (GLOVE) ×2
GLOVE BIOGEL PI INDICATOR 7.0 (GLOVE) ×1
GLOVE BIOGEL PI INDICATOR 8.5 (GLOVE) ×1
GLOVE ECLIPSE 6.5 STRL STRAW (GLOVE) ×2 IMPLANT
GLOVE ECLIPSE 7.0 STRL STRAW (GLOVE) ×2 IMPLANT
GOWN PREVENTION PLUS XXLARGE (GOWN DISPOSABLE) ×2 IMPLANT
GOWN STRL NON-REIN LRG LVL3 (GOWN DISPOSABLE) ×6 IMPLANT
KIT BASIN OR (CUSTOM PROCEDURE TRAY) ×2 IMPLANT
KIT ROOM TURNOVER OR (KITS) ×2 IMPLANT
NS IRRIG 1000ML POUR BTL (IV SOLUTION) ×2 IMPLANT
PAD ARMBOARD 7.5X6 YLW CONV (MISCELLANEOUS) ×4 IMPLANT
POUCH SPECIMEN RETRIEVAL 10MM (ENDOMECHANICALS) ×2 IMPLANT
SCISSORS LAP 5X35 DISP (ENDOMECHANICALS) ×2 IMPLANT
SET CHOLANGIOGRAPH 5 50 .035 (SET/KITS/TRAYS/PACK) ×2 IMPLANT
SET IRRIG TUBING LAPAROSCOPIC (IRRIGATION / IRRIGATOR) ×2 IMPLANT
SLEEVE ENDOPATH XCEL 5M (ENDOMECHANICALS) ×2 IMPLANT
SLEEVE Z-THREAD 5X100MM (TROCAR) IMPLANT
SPECIMEN JAR SMALL (MISCELLANEOUS) ×2 IMPLANT
SUT MNCRL AB 4-0 PS2 18 (SUTURE) ×2 IMPLANT
TOWEL OR 17X24 6PK STRL BLUE (TOWEL DISPOSABLE) ×2 IMPLANT
TOWEL OR 17X26 10 PK STRL BLUE (TOWEL DISPOSABLE) ×2 IMPLANT
TRAY LAPAROSCOPIC (CUSTOM PROCEDURE TRAY) ×2 IMPLANT
TROCAR XCEL BLUNT TIP 100MML (ENDOMECHANICALS) ×4 IMPLANT
TROCAR XCEL NON-BLD 11X100MML (ENDOMECHANICALS) ×2 IMPLANT
TROCAR XCEL NON-BLD 5MMX100MML (ENDOMECHANICALS) ×2 IMPLANT
TROCAR Z-THREAD FIOS 11X100 BL (TROCAR) IMPLANT
TROCAR Z-THREAD FIOS 5X100MM (TROCAR) IMPLANT
WATER STERILE IRR 1000ML POUR (IV SOLUTION) IMPLANT

## 2012-02-12 NOTE — Progress Notes (Signed)
ANTICOAGULATION CONSULT NOTE - Initial Consult  Pharmacy Consult for Restart Heparin Indication: hx of AVR  Allergies  Allergen Reactions  . Bee Venom Anaphylaxis  . Sulfa Antibiotics Anaphylaxis  . Truvada (Emtricitabine-Tenofovir) Anaphylaxis  . Lidoderm (Lidocaine) Other (See Comments)    Reaction unknown  Patient Measurements: Height: 5\' 9"  (175.3 cm) Weight: 120 lb 5.9 oz (54.6 kg) IBW/kg (Calculated) : 70.7  Heparin Dosing Weight: 58 kg Vital Signs: Temp: 97.6 F (36.4 C) (12/04 1615) Temp src: Oral (12/04 1212) BP: 158/86 mmHg (12/04 1600) Pulse Rate: 101  (12/04 1430) Labs:  Basename 02/12/12 0547 02/11/12 0510 02/10/12 2156 02/10/12 0930 02/10/12 0505 02/09/12 1825  HGB 8.7* 8.6* -- -- -- --  HCT 25.6* 25.1* -- 23.8* -- --  PLT 274 312 -- 339 -- --  APTT -- -- -- -- -- --  LABPROT 18.0* 17.0* -- -- -- 16.9*  INR 1.54* 1.42 -- -- -- 1.41  HEPARINUNFRC 0.12* 0.32 0.16* -- -- --  CREATININE 1.28 1.25 -- -- 1.24 --  CKTOTAL -- 150 -- -- -- --  CKMB -- -- -- -- -- --  TROPONINI -- -- -- -- -- --   Estimated Creatinine Clearance: 56.9 ml/min (by C-G formula based on Cr of 1.28).  Medications:  Infusions:    . sodium chloride 100 mL/hr at 02/12/12 1657  . [DISCONTINUED] heparin Stopped (02/12/12 0400)   Assessment: 46 YOM with hx of AVR on Coumadin PTA now s/p lap chole to restart IV heparin at 2100 tonight per surgery and to restart Coumadin 12/5. Heparin level was at goal (0.32) on rate of 1450 units/hr. Heparin was held at 0400 AM prior to surgery, therefore level from this am of 0.12 drawn at 0547 was 1.5hrs off of heparin. Note that INR was up this am at 1.54 off of Coumadin (last dose was 12/2). No current post-op labs available.   Goal of Therapy:  Heparin level 0.3-0.7 units/ml Monitor platelets by anticoagulation protocol: Yes   Plan:  1. Restart IV heparin at 1450 units/hr (14.33mL/hr) at 2100. No bolus secondary to recent surgery.  2. Follow-up  heparin level 6 hours after restart.  3. Daily Heparin level and CBC. 4. Coumadin to restart 12/5- will order PT/INR to assess in AM to help determine dosing.   Link Snuffer, PharmD, BCPS Clinical Pharmacist [phone] 02/12/2012,5:18 PM

## 2012-02-12 NOTE — Progress Notes (Signed)
Subjective: Afebrile, no CP, no SOB. S/P cholecystectomy. Overnight described some difficulty controlling pain and also issues with elevated BP.  Objective: Vital signs in last 24 hours: Filed Vitals:   02/12/12 1600 02/12/12 1615 02/12/12 1715 02/12/12 2033  BP: 158/86  167/95 182/108  Pulse:   84 86  Temp:  97.6 F (36.4 C) 97.8 F (36.6 C) 98.3 F (36.8 C)  TempSrc:   Axillary Oral  Resp:   16 18  Height:      Weight:      SpO2:   98% 97%   Weight change:   Intake/Output Summary (Last 24 hours) at 02/12/12 2154 Last data filed at 02/12/12 2035  Gross per 24 hour  Intake   1715 ml  Output   1400 ml  Net    315 ml    Physical Exam: General: sleepy after surgery; but easily arouse; NAD and no major complications from surgery HEENT: EOMI. Neck: Supple CV: S1 and S2 Lungs: Clear to ascultation bilaterally Abdomen: Soft, some mild tenderness to palpation in the epigastric region, mild distension and decrease/absent BS Ext: Good pulses. Trace edema.  Right knee covered in bandages sutures in place does not appear to be infected, warm to touch and slightly swollen compared to left knee.  Lab Results: Basic Metabolic Panel:  Lab 02/12/12 0865 02/11/12 0510 02/10/12 0505 02/09/12 1825  NA 138 139 139 139  K 4.0 4.0 4.0 4.4  CL 105 106 105 104  CO2 26 25 26 25   GLUCOSE 90 74 89 115*  BUN 5* 7 10 11   CREATININE 1.28 1.25 1.24 1.21  CALCIUM 9.1 8.3* 8.8 9.5  MG -- -- -- --  PHOS -- -- -- --   Liver Function Tests:  Lab 02/11/12 0510 02/09/12 1825  AST 24 29  ALT 17 23  ALKPHOS 86 104  BILITOT 0.3 0.3  PROT 6.6 7.7  ALBUMIN 2.5* 3.1*    Lab 02/11/12 0510 02/09/12 1825  LIPASE 81* 76*  AMYLASE -- --   CBC:  Lab 02/12/12 0547 02/11/12 0510 02/10/12 0930 02/09/12 1825  WBC 4.3 5.3 5.6 6.0  NEUTROABS -- -- -- 4.2  HGB 8.7* 8.6* 8.2* 7.5*  HCT 25.6* 25.1* 23.8* 21.6*  MCV 95.9 95.4 93.3 95.2  PLT 274 312 339 361   Cardiac Enzymes:  Lab 02/11/12 0510   CKTOTAL 150  CKMB --  CKMBINDEX --  TROPONINI --   Other Labs:  Lab 02/10/12 0505  VITAMINB12 338  FOLATE 8.4  FERRITIN 654*  TIBC 178*  IRON 95  RETICCTPCT 4.4*    Micro Results: Recent Results (from the past 240 hour(s))  MRSA PCR SCREENING     Status: Normal   Collection Time   02/09/12 11:16 PM      Component Value Range Status Comment   MRSA by PCR NEGATIVE  NEGATIVE Final   SURGICAL PCR SCREEN     Status: Normal   Collection Time   02/11/12  4:53 PM      Component Value Range Status Comment   MRSA, PCR NEGATIVE  NEGATIVE Final    Staphylococcus aureus NEGATIVE  NEGATIVE Final     Studies/Results: Dg Cholangiogram Operative  02/12/2012  *RADIOLOGY REPORT*  Intraoperative cholangiogram  History:  Cholelithiasis  Findings:  The gallbladder has been removed, and the cystic duct has been cannulated.  The portions of the intrahepatic biliary ductal system which are visualized appear normal.  The common hepatic and common bile ducts appear normal.  No mass or calculus seen.  There is apparent free flow of contrast via the common bile duct into the duodenum.  Conclusion:  No abnormality noted.   Original Report Authenticated By: Bretta Bang, M.D.     Medications: I have reviewed the patient's current medications. Scheduled Meds:    . azithromycin  1,200 mg Oral Q7 days  . calcium-vitamin D  1 tablet Oral QAC breakfast  . ciprofloxacin  500 mg Oral BID  . dapsone  100 mg Oral Daily  . DAPTOmycin (CUBICIN)  IV  500 mg Intravenous Q24H  . darunavir  600 mg Oral BID WC  . feeding supplement  1 Container Oral TID BM  . folic acid  1 mg Oral Daily  . hydrochlorothiazide  25 mg Oral Daily  . HYDROmorphone      . [COMPLETED] levofloxacin (LEVAQUIN) IV  750 mg Intravenous Once  . mirtazapine  30 mg Oral QHS  . nystatin  5 mL Oral QID  . ondansetron      . pantoprazole (PROTONIX) IV  40 mg Intravenous Q12H  . ritonavir  100 mg Oral BID WC  . tenofovir  300 mg Oral  Daily  . valACYclovir  1,000 mg Oral Daily  . vitamin B-12  1,000 mcg Oral Daily  . zidovudine  300 mg Oral BID  . [DISCONTINUED] darunavir  600 mg Oral BID WC  . [DISCONTINUED] tenofovir  300 mg Oral Daily   Continuous Infusions:    . sodium chloride 100 mL/hr at 02/12/12 1657  . heparin 1,450 Units/hr (02/12/12 2114)  . [DISCONTINUED] heparin Stopped (02/12/12 0400)   PRN Meds:.acetaminophen, acetaminophen, alum & mag hydroxide-simeth, hydrALAZINE, HYDROmorphone (DILAUDID) injection, ondansetron (ZOFRAN) IV, ondansetron, oxyCODONE, phenol, prochlorperazine, sodium chloride, zolpidem, [DISCONTINUED] 0.9 % irrigation (POUR BTL), [DISCONTINUED] hydrALAZINE, [DISCONTINUED]  HYDROmorphone (DILAUDID) injection, [DISCONTINUED] Marcaine 0.25% w/EPINEPHrine 1:200,000 20 mL with lidocaine 1% 20 mL injection [DISCONTINUED] Omnipaque 300 mg/mL (50 mL) in 0.9% normal saline (50 mL), [DISCONTINUED] ondansetron (ZOFRAN) IV, [DISCONTINUED] oxyCODONE, [DISCONTINUED] sodium chloride irrigation  Assessment/Plan: Abdominal pain/nausea/vomiting (chronic pancreatitis) Likely due to gallstones.  Appreciate surgery's input and assistance with this case. -S/p lap cholecystectomy  -will follow post surgery recommendations -will follow symptoms and advance diet as guided by surgery recommendations. -continue supportive care and PRN antiemetics.  Hypertension Continues to be eleavted. Suspect pain is contributing to problem. -pain meds as needed -Continue HCTZ -Continue PRN hydralazine; if needed will add bidil for better control.  HIV/AIDS Patient continued on his home anti-retroviral therapy.  Continue valacyclovir, dapsone, and azithromycin to prevent opportunistic infections.  Patient to continue to follow with infectious disease service at [facility].  Septic arthritis of the right knee At [facility], patient underwent irrigation debridement of right knee, patient was  hospitalized from 01/20/2012 to 02/03/2012.  Initially was on vancomycin and ceftriaxone, patient had a rash due to ceftriaxone which was changed to aztreonam.  At the time of discharge patient was discharged on daptomycin and ciprofloxacin until 02/18/2012.  Patient at this time not complaining of any knee pain.  -Continue current antibiotic regimen, to followup with orthopedic physician at [facility] as previously scheduled.  Chronic kidney disease stage I/recent history of acute on chronic renal failure during hospitalization at [facility] -Recent creatinine peaked at 1.7.   -Patient chronic kidney disease secondary to HTN and HIV. -Cr stable. Continue monitoring.  Anemia Likely due to chronic disease.  Anemia panel was suggestive of iron deficiency anemia.  Received 1  unit of PRBC on 02/09/2012.   -Hgb stable at 8.7; no need for transfusion -FOBT negative -Continue monitoring  History of mechanical heart valve Continue heparin drip and bridge to coumadin. Dose per pharmacy  Prophylaxis Heparin drip per pharmacy  Disposition S/p Lap cholecystectomy; needs to be able to tolerate diet and to work with PT/OT to help deciding best discharge plan.   LOS: 3 days  Tereso Unangst, MD 02/12/2012, 9:54 PM

## 2012-02-12 NOTE — Preoperative (Signed)
Beta Blockers   Reason not to administer Beta Blockers:Not Applicable 

## 2012-02-12 NOTE — Anesthesia Preprocedure Evaluation (Addendum)
Anesthesia Evaluation  Patient identified by MRN, date of birth, ID band Patient awake    Reviewed: Allergy & Precautions, H&P , NPO status , Patient's Chart, lab work & pertinent test results  Airway Mallampati: I TM Distance: >3 FB Neck ROM: full    Dental  (+) Dental Advidsory Given and Teeth Intact   Pulmonary          Cardiovascular hypertension, + Valvular Problems/Murmurs AS Rate:Normal     Neuro/Psych CVA    GI/Hepatic   Endo/Other    Renal/GU      Musculoskeletal   Abdominal   Peds  Hematology  (+) anemia ,   Anesthesia Other Findings HIV Hx Pancreatitis  Reproductive/Obstetrics                          Anesthesia Physical Anesthesia Plan  ASA: III  Anesthesia Plan: General   Post-op Pain Management:    Induction: Intravenous  Airway Management Planned: Oral ETT  Additional Equipment:   Intra-op Plan:   Post-operative Plan: Extubation in OR  Informed Consent:   Dental Advisory Given  Plan Discussed with: CRNA, Anesthesiologist and Surgeon  Anesthesia Plan Comments:        Anesthesia Quick Evaluation

## 2012-02-12 NOTE — Op Note (Signed)
Laparoscopic Cholecystectomy with IOC Procedure Note  Indications: This patient presents with gallstone pancreatitis and will undergo laparoscopic cholecystectomy.  Pre-operative Diagnosis: Gallstone pancreatitis and chronic cholelithiasis  Post-operative Diagnosis: Same  Surgeon: Almond Lint   Assistants: Barnetta Chapel, PA-C  Anesthesia: General endotracheal anesthesia and local  ASA Class: 3  Procedure Details  The patient was seen again in the Holding Room. The risks, benefits, complications, treatment options, and expected outcomes were discussed with the patient. The possibilities of  bleeding, recurrent infection, damage to nearby structures, the need for additional procedures, failure to diagnose a condition, the possible need to convert to an open procedure, and creating a complication requiring transfusion or operation were discussed with the patient. The likelihood of improving the patient's symptoms with return to their baseline status is good.    The patient and/or family concurred with the proposed plan, giving informed consent. The site of surgery properly noted. The patient was taken to Operating Room, and the procedure verified as Laparoscopic Cholecystectomy with Intraoperative Cholangiogram. A Time Out was held and the above information confirmed.  Prior to the induction of general anesthesia, antibiotic prophylaxis was administered. General endotracheal anesthesia was then administered and tolerated well. After the induction, the abdomen was prepped with Chloraprep and draped in the sterile fashion. The patient was positioned in the supine position.  Local anesthetic agent was injected into the skin near the umbilicus and an incision made. We dissected down to the abdominal fascia with blunt dissection.  The fascia was incised vertically and we entered the peritoneal cavity bluntly.  A pursestring suture of 0-Vicryl was placed around the fascial opening.  The Hasson cannula  was inserted and secured with the stay suture.  Pneumoperitoneum was then created with CO2 and tolerated well without any adverse changes in the patient's vital signs. An 11-mm port was placed in the subxiphoid position.  Two 5-mm ports were placed in the right upper quadrant. All skin incisions were infiltrated with a local anesthetic agent before making the incision and placing the trocars.   We positioned the patient in reverse Trendelenburg, tilted slightly to the patient's left.  The gallbladder was identified, the fundus grasped and retracted cephalad. Adhesions were lysed bluntly and with the electrocautery where indicated, taking care not to injure any adjacent organs or viscus. The infundibulum was grasped and retracted laterally, exposing the peritoneum overlying the triangle of Calot. This was then divided and exposed in a blunt fashion. A critical view of the cystic duct and cystic artery was obtained.  The cystic duct was clearly identified and bluntly dissected circumferentially. The cystic duct was ligated with a clip distally.   An incision was made in the cystic duct and the [facility] cholangiogram catheter introduced. The catheter was secured using a clip. A cholangiogram was then performed, demonstrating good filling of the left and right hepatic duct, and the common duct to the ampulla.  No filling defects were seen, and the duodenum filled.  The cystic duct was then ligated with clips and divided. The cystic artery was identified, dissected free, ligated with clips and divided as well.   The gallbladder was dissected from the liver bed in retrograde fashion with the electrocautery. The gallbladder was removed and placed in an Endocatch bag.  The gallbladder and Endocatch bag were then removed through the umbilical port site.  The liver bed was irrigated and inspected. Hemostasis was achieved with the electrocautery. Iirrigation was utilized and was aspirated until clear.    We again inspected  the right upper quadrant for hemostasis.  Pneumoperitoneum was released as we removed the trocars.   The pursestring suture was used to close the umbilical fascia.  4-0 Monocryl was used to close the skin.   The skin was cleaned and dry, and Dermabond was applied. The patient was then extubated and brought to the recovery room in stable condition. Instrument, sponge, and needle counts were correct at closure and at the conclusion of the case.   Findings: Chronic cholecystitis and cholelithiasis.    Estimated Blood Loss: min         Drains: none          Specimens: Gallbladder to pathology       Complications: None; patient tolerated the procedure well.         Disposition: PACU - hemodynamically stable.         Condition: stable

## 2012-02-12 NOTE — Progress Notes (Signed)
Patient continuously asked for tech to do BP on his legs due to PICC being in left arm and PIV being in the right with heparin going. BP's when being taken in the legs are in the 170's. I stated to the patient that I could not give his any hydralazine PRN unless we receive an accurate BP in the arm. Pt allow BP to be taken in the right arm and BP was in the 130. It was recommended to staff that regardless of PIV being in the right arm. All BP's need to be taken in the arms to be accurate. Will continue to monitor. Heparin was also stopped at 0400 this AM for surgery.   Mattelyn Imhoff J. Lendell Caprice RN BSN

## 2012-02-12 NOTE — Progress Notes (Signed)
Lap chole today.

## 2012-02-12 NOTE — Progress Notes (Signed)
Patient ID: Brandon [name], male   DOB: 1965/10/21, 46 y.o.   MRN: [id number]    Subjective:  Resting quietly this am still with ruq abdominal pain, awaiting surgery later today.  Objective: Vital signs in last 24 hours: Temp:  [98.8 F (37.1 C)-99.4 F (37.4 C)] 98.8 F (37.1 C) (12/04 0440) Pulse Rate:  [76-91] 76  (12/04 0440) Resp:  [18-20] 20  (12/04 0440) BP: (133-226)/(73-132) 133/73 mmHg (12/04 0440) SpO2:  [96 %-100 %] 99 % (12/04 0440) Last BM Date: 02/11/12  Intake/Output from previous day: 12/03 0701 - 12/04 0700 In: 1415.8 [P.O.:480; I.V.:825.8; IV Piggyback:110] Out: 750 [Urine:750] Intake/Output this shift:    General appearance: alert, cooperative, appears stated age, fatigued and no distress Chest: CTA Cardiac: RRR Abdomen: RUQ and mid abdominal tenderness No N/V/D Has been NPO since midnight in anticipation of surgery today. Extremities: thin, warm to touch, no edema or tenderness, Right knee bandaged from previous effusion drainage. Labs: WBC wnl, H&H stable, remains on heparin drip; INR 1.54  PLTs have trended down to 274,000 from 312,000. BUN improved Creatine has trended up slightly 1.28 vs 1.25.  Lab Results:   [facility] 02/12/12 0547 02/11/12 0510  WBC 4.3 5.3  HGB 8.7* 8.6*  HCT 25.6* 25.1*  PLT 274 312   BMET  Basename 02/12/12 0547 02/11/12 0510  NA 138 139  K 4.0 4.0  CL 105 106  CO2 26 25  GLUCOSE 90 74  BUN 5* 7  CREATININE 1.28 1.25  CALCIUM 9.1 8.3*   PT/INR  Basename 02/12/12 0547 02/11/12 0510  LABPROT 18.0* 17.0*  INR 1.54* 1.42   ABG No results found for this basename: PHART:2,PCO2:2,PO2:2,HCO3:2 in the last 72 hours  Studies/Results: No results found.  Anti-infectives: Anti-infectives     Start     Dose/Rate Route Frequency Ordered Stop   02/12/12 0000   levofloxacin (LEVAQUIN) IVPB 750 mg     Comments: Please give on call to operating room.   Thank you      750 mg 100 mL/hr over 90 Minutes Intravenous  Once  02/11/12 1015     02/10/12 1700   darunavir (PREZISTA) tablet 300 mg  Status:  Discontinued        300 mg Oral 2 times daily with meals 02/10/12 1027 02/10/12 1057   02/10/12 1700   darunavir (PREZISTA) tablet 600 mg        600 mg Oral 2 times daily with meals 02/10/12 1058     02/10/12 1400   DAPTOmycin (CUBICIN) 500 mg in sodium chloride 0.9 % IVPB        500 mg 220 mL/hr over 30 Minutes Intravenous Every 24 hours 02/10/12 0233 02/18/12 1359   02/10/12 1200   tenofovir (VIREAD) tablet 300 mg        300 mg Oral Daily 02/10/12 1058     02/10/12 1000   dapsone tablet 100 mg        100 mg Oral Daily 02/10/12 0108     02/10/12 1000   valACYclovir (VALTREX) tablet 1,000 mg        1,000 mg Oral Daily 02/10/12 0108     02/10/12 1000   azithromycin (ZITHROMAX) tablet 1,200 mg        1,200 mg Oral Every 7 days 02/10/12 0108     02/10/12 0800   ciprofloxacin (CIPRO) tablet 500 mg        500 mg Oral 2 times daily 02/10/12 0107 02/18/12 0759   02/10/12  0800   ritonavir (NORVIR) capsule 100 mg        100 mg Oral 2 times daily with meals 02/10/12 0107     02/10/12 0200   zidovudine (RETROVIR) capsule 300 mg        300 mg Oral 2 times daily 02/10/12 0107     02/10/12 0200   raltegravir (ISENTRESS) tablet 400 mg  Status:  Discontinued        400 mg Oral 2 times daily 02/10/12 0107 02/10/12 1057   02/10/12 0200   maraviroc (SELZENTRY) tablet 150 mg  Status:  Discontinued        150 mg Oral 2 times daily 02/10/12 0107 02/10/12 1057   02/10/12 0115   DAPTOmycin (CUBICIN) injection 500 mg  Status:  Discontinued        500 mg Intravenous 1 Day/Dose 02/10/12 0108 02/10/12 0233          Assessment/Plan:  Patient Active Problem List  Diagnosis  . Acute pancreatitis  . Nausea & vomiting  . Abdominal pain  . Anemia  . S/P AVR (aortic valve replacement)  . Septic arthritis of knee  . Warfarin-induced coagulopathy  . HIV disease   s/p Procedure(s) (LRB) with comments: LAPAROSCOPIC  CHOLECYSTECTOMY (N/A)  Plan: 1. Stop Heparin drip at 0400 am (done) 2. NPO after midnight for same. 3. Patient will receive 750mg  Levaquin as a pre-op abx   LOS: 3 days    [facility] Surgery Pager # [phone]  02/12/2012

## 2012-02-13 ENCOUNTER — Encounter (HOSPITAL_COMMUNITY): Payer: Self-pay | Admitting: General Surgery

## 2012-02-13 ENCOUNTER — Telehealth (INDEPENDENT_AMBULATORY_CARE_PROVIDER_SITE_OTHER): Payer: Self-pay

## 2012-02-13 LAB — HEPATIC FUNCTION PANEL
ALT: 24 U/L (ref 0–53)
AST: 40 U/L — ABNORMAL HIGH (ref 0–37)
Bilirubin, Direct: <0.1 mg/dL (ref 0.0–0.3)
Total Protein: 7.3 g/dL (ref 6.0–8.3)

## 2012-02-13 LAB — PROTIME-INR: INR: 1.33 (ref 0.00–1.49)

## 2012-02-13 LAB — CBC
HCT: 29.6 % — ABNORMAL LOW (ref 39.0–52.0)
Hemoglobin: 9.9 g/dL — ABNORMAL LOW (ref 13.0–17.0)
MCV: 96.4 fL (ref 78.0–100.0)
RBC: 3.07 MIL/uL — ABNORMAL LOW (ref 4.22–5.81)
WBC: 5.8 10*3/uL (ref 4.0–10.5)

## 2012-02-13 LAB — HEPARIN LEVEL (UNFRACTIONATED): Heparin Unfractionated: 0.4 IU/mL (ref 0.30–0.70)

## 2012-02-13 LAB — BASIC METABOLIC PANEL
BUN: 5 mg/dL — ABNORMAL LOW (ref 6–23)
Chloride: 104 mEq/L (ref 96–112)
GFR calc non Af Amer: 65 mL/min — ABNORMAL LOW (ref >90)
Glucose, Bld: 101 mg/dL — ABNORMAL HIGH (ref 70–99)
Potassium: 5 mEq/L (ref 3.5–5.1)
Sodium: 139 mEq/L (ref 135–145)

## 2012-02-13 MED ORDER — OXYCODONE HCL 5 MG PO TABS
10.0000 mg | ORAL_TABLET | ORAL | Status: DC | PRN
  Administered 2012-02-13 – 2012-02-14 (×2): 20 mg via ORAL
  Filled 2012-02-13 (×2): qty 4

## 2012-02-13 MED ORDER — HYDROMORPHONE HCL PF 1 MG/ML IJ SOLN
1.0000 mg | INTRAMUSCULAR | Status: DC | PRN
  Administered 2012-02-13 – 2012-02-14 (×9): 2 mg via INTRAVENOUS
  Filled 2012-02-13 (×8): qty 2
  Filled 2012-02-13: qty 1
  Filled 2012-02-13: qty 2

## 2012-02-13 MED ORDER — WARFARIN SODIUM 7.5 MG PO TABS
7.5000 mg | ORAL_TABLET | Freq: Once | ORAL | Status: AC
  Administered 2012-02-13: 7.5 mg via ORAL
  Filled 2012-02-13: qty 1

## 2012-02-13 MED ORDER — WARFARIN - PHARMACIST DOSING INPATIENT: Freq: Every day | Status: DC

## 2012-02-13 MED ORDER — ISOSORB DINITRATE-HYDRALAZINE 20-37.5 MG PO TABS
1.0000 | ORAL_TABLET | Freq: Two times a day (BID) | ORAL | Status: DC
  Administered 2012-02-13 – 2012-02-14 (×2): 1 via ORAL
  Filled 2012-02-13 (×3): qty 1

## 2012-02-13 NOTE — Telephone Encounter (Signed)
Post op appointment scheduled for 03/12/2012 at 2:30pm.  Pt is still in the hospital.

## 2012-02-13 NOTE — Progress Notes (Signed)
Subjective: Afebrile, no CP, no SOB. Doing better; diet advance today, lipase normal and so far no nausea or vomiting. BP slightly better.  Objective: Vital signs in last 24 hours: Filed Vitals:   02/12/12 2033 02/12/12 2312 02/13/12 0500 02/13/12 2115  BP: 182/108 160/90 164/92 167/93  Pulse: 86  76 93  Temp: 98.3 F (36.8 C)  99.3 F (37.4 C) 99.2 F (37.3 C)  TempSrc: Oral  Oral Oral  Resp: 18  18 19   Height:      Weight:      SpO2: 97%  98% 97%   Weight change:   Intake/Output Summary (Last 24 hours) at 02/13/12 2145 Last data filed at 02/13/12 1900  Gross per 24 hour  Intake 2863.99 ml  Output   1075 ml  Net 1788.99 ml    Physical Exam: General: sleepy after surgery; but easily arouse; NAD and no major complications from surgery HEENT: EOMI. Neck: Supple CV: S1 and S2 Lungs: Clear to ascultation bilaterally Abdomen: Soft, some mild tenderness to palpation in the epigastric region, mild distension and decrease/absent BS Ext: Good pulses. Trace edema.  Right knee covered in bandages sutures in place does not appear to be infected, warm to touch and slightly swollen compared to left knee.  Lab Results: Basic Metabolic Panel:  Lab 02/13/12 8295 02/12/12 0547 02/11/12 0510 02/10/12 0505 02/09/12 1825  NA 139 138 139 139 139  K 5.0 4.0 4.0 4.0 4.4  CL 104 105 106 105 104  CO2 25 26 25 26 25   GLUCOSE 101* 90 74 89 115*  BUN 5* 5* 7 10 11   CREATININE 1.31 1.28 1.25 1.24 1.21  CALCIUM 9.3 9.1 8.3* 8.8 9.5  MG -- -- -- -- --  PHOS -- -- -- -- --   Liver Function Tests:  Lab 02/13/12 1030 02/11/12 0510 02/09/12 1825  AST 40* 24 29  ALT 24 17 23   ALKPHOS 88 86 104  BILITOT 0.4 0.3 0.3  PROT 7.3 6.6 7.7  ALBUMIN 2.8* 2.5* 3.1*    Lab 02/13/12 1030 02/11/12 0510 02/09/12 1825  LIPASE 21 81* 76*  AMYLASE -- -- --   CBC:  Lab 02/13/12 0441 02/12/12 0547 02/11/12 0510 02/10/12 0930 02/09/12 1825  WBC 5.8 4.3 5.3 5.6 6.0  NEUTROABS -- -- -- -- 4.2  HGB 9.9*  8.7* 8.6* 8.2* 7.5*  HCT 29.6* 25.6* 25.1* 23.8* 21.6*  MCV 96.4 95.9 95.4 93.3 95.2  PLT 239 274 312 339 361   Cardiac Enzymes:  Lab 02/11/12 0510  CKTOTAL 150  CKMB --  CKMBINDEX --  TROPONINI --   Other Labs:  Lab 02/10/12 0505  VITAMINB12 338  FOLATE 8.4  FERRITIN 654*  TIBC 178*  IRON 95  RETICCTPCT 4.4*    Micro Results: Recent Results (from the past 240 hour(s))  MRSA PCR SCREENING     Status: Normal   Collection Time   02/09/12 11:16 PM      Component Value Range Status Comment   MRSA by PCR NEGATIVE  NEGATIVE Final   SURGICAL PCR SCREEN     Status: Normal   Collection Time   02/11/12  4:53 PM      Component Value Range Status Comment   MRSA, PCR NEGATIVE  NEGATIVE Final    Staphylococcus aureus NEGATIVE  NEGATIVE Final     Studies/Results: Dg Cholangiogram Operative  02/12/2012  *RADIOLOGY REPORT*  Intraoperative cholangiogram  History:  Cholelithiasis  Findings:  The gallbladder has been removed, and the  cystic duct has been cannulated.  The portions of the intrahepatic biliary ductal system which are visualized appear normal.  The common hepatic and common bile ducts appear normal.  No mass or calculus seen.  There is apparent free flow of contrast via the common bile duct into the duodenum.  Conclusion:  No abnormality noted.   Original Report Authenticated By: Bretta Bang, M.D.     Medications: I have reviewed the patient's current medications. Scheduled Meds:    . azithromycin  1,200 mg Oral Q7 days  . calcium-vitamin D  1 tablet Oral QAC breakfast  . ciprofloxacin  500 mg Oral BID  . dapsone  100 mg Oral Daily  . DAPTOmycin (CUBICIN)  IV  500 mg Intravenous Q24H  . darunavir  600 mg Oral BID WC  . feeding supplement  1 Container Oral TID BM  . folic acid  1 mg Oral Daily  . hydrochlorothiazide  25 mg Oral Daily  . mirtazapine  30 mg Oral QHS  . nystatin  5 mL Oral QID  . pantoprazole (PROTONIX) IV  40 mg Intravenous Q12H  . ritonavir  100  mg Oral BID WC  . tenofovir  300 mg Oral Daily  . valACYclovir  1,000 mg Oral Daily  . vitamin B-12  1,000 mcg Oral Daily  . [COMPLETED] warfarin  7.5 mg Oral ONCE-1800  . Warfarin - Pharmacist Dosing Inpatient   Does not apply q1800  . zidovudine  300 mg Oral BID   Continuous Infusions:    . sodium chloride 1,000 mL (02/13/12 1242)  . heparin 1,550 Units/hr (02/13/12 1343)   PRN Meds:.acetaminophen, acetaminophen, alum & mag hydroxide-simeth, hydrALAZINE, HYDROmorphone (DILAUDID) injection, ondansetron (ZOFRAN) IV, ondansetron, oxyCODONE, phenol, prochlorperazine, sodium chloride, zolpidem, [DISCONTINUED] hydrALAZINE, [DISCONTINUED]  HYDROmorphone (DILAUDID) injection, [DISCONTINUED] oxyCODONE  Assessment/Plan: Abdominal pain/nausea/vomiting (chronic pancreatitis) Likely due to gallstones.  Appreciate surgery's input and assistance with this case. -S/p lap cholecystectomy  -will follow post surgery recommendations -will follow symptoms and advance diet as guided by surgery recommendations. -continue supportive care and PRN antiemetics. -Lipase WNL now and so far no nausea or vomiting -Liver function to be evaluated tomorrow, if stable will d/c home.  Hypertension Better, but still slightly elevated -pain meds as needed -Continue HCTZ -Continue PRN hydralazine -Will start bidil for better control   HIV/AIDS Patient continued on his home anti-retroviral therapy.  Continue valacyclovir, dapsone, and azithromycin to prevent opportunistic infections.  Patient to continue to follow with infectious disease service at [facility].  Septic arthritis of the right knee At [facility], patient underwent irrigation debridement of right knee, patient was hospitalized from 01/20/2012 to 02/03/2012.  Initially was on vancomycin and ceftriaxone, patient had a rash due to ceftriaxone which was changed to aztreonam.  At the time of discharge patient was discharged on  daptomycin and ciprofloxacin until 02/18/2012.  Patient at this time not complaining of any knee pain.   -Continue current antibiotic regimen, to followup with orthopedic physician at [facility] as previously scheduled.  Chronic kidney disease stage I/recent history of acute on chronic renal failure during hospitalization at [facility] -Recent creatinine peaked at 1.7.   -Patient chronic kidney disease secondary to HTN and HIV. -Cr 1.31 and stable.   Anemia Likely due to chronic disease.  Anemia panel was suggestive of iron deficiency anemia.  Received 1 unit of PRBC on 02/09/2012.   -Hgb stable at 9.9; no need for transfusion -FOBT negative -Continue monitoring  History of  mechanical heart valve Continue heparin drip and bridge to coumadin. Dose per pharmacy  Prophylaxis Heparin drip per pharmacy  Disposition S/p Lap cholecystectomy; pain better; diet advance to low fat diet today; if tolerated home in am.   LOS: 4 days  Gloyd Happ, MD 02/13/2012, 9:45 PM

## 2012-02-13 NOTE — Progress Notes (Signed)
Pain control. Check labs. Diet as tolerated

## 2012-02-13 NOTE — Progress Notes (Signed)
ANTICOAGULATION CONSULT NOTE   Pharmacy Consult for Restart Heparin Indication: hx of AVR  Allergies  Allergen Reactions  . Bee Venom Anaphylaxis  . Sulfa Antibiotics Anaphylaxis  . Truvada (Emtricitabine-Tenofovir) Anaphylaxis  . Lidoderm (Lidocaine) Other (See Comments)    Reaction unknown  Patient Measurements: Height: 5\' 9"  (175.3 cm) Weight: 120 lb 5.9 oz (54.6 kg) IBW/kg (Calculated) : 70.7  Heparin Dosing Weight: 58 kg Vital Signs: Temp: 99.3 F (37.4 C) (12/05 0500) Temp src: Oral (12/05 0500) BP: 164/92 mmHg (12/05 0500) Pulse Rate: 76  (12/05 0500) Labs:  Basename 02/13/12 0441 02/12/12 0547 02/11/12 0510  HGB 9.9* 8.7* --  HCT 29.6* 25.6* 25.1*  PLT 239 274 312  APTT -- -- --  LABPROT 16.2* 18.0* 17.0*  INR 1.33 1.54* 1.42  HEPARINUNFRC 0.27* 0.12* 0.32  CREATININE 1.31 1.28 1.25  CKTOTAL -- -- 150  CKMB -- -- --  TROPONINI -- -- --   Estimated Creatinine Clearance: 55.6 ml/min (by C-G formula based on Cr of 1.31).  Assessment: 46 YO Male with hx of AVR s/p cholecystectomy 12/4, for anticoagulation.  Goal of Therapy:  Heparin level 0.3-0.7 units/ml INR 2.5-3.5 Monitor platelets by anticoagulation protocol: Yes   Plan:  Increase Heparin 1550 units/hr Check heparin level in 8 hours. Coumadin 7.5 mg today  Geannie Risen, PharmD, BCPS  02/13/2012,6:38 AM

## 2012-02-13 NOTE — Anesthesia Postprocedure Evaluation (Signed)
  Anesthesia Post-op Note  Patient: Brandon [name]  Procedure(s) Performed: Procedure(s) (LRB) with comments: LAPAROSCOPIC CHOLECYSTECTOMY (N/A)  Patient Location: PACU  Anesthesia Type:General  Level of Consciousness: awake, oriented and patient cooperative  Airway and Oxygen Therapy: Patient Spontanous Breathing  Post-op Pain: mild  Post-op Assessment: Post-op Vital signs reviewed, Patient's Cardiovascular Status Stable, Respiratory Function Stable, Patent Airway, No signs of Nausea or vomiting and Pain level controlled  Post-op Vital Signs: stable  Complications: No apparent anesthesia complications

## 2012-02-13 NOTE — Progress Notes (Signed)
Ambulated with pt. Down to room 5533 and back.  Pt. Ambulated with cane which he was using at home prior to admission.  Pt. Tolerated well.  Will continue to follow.  Forbes Cellar, RN

## 2012-02-13 NOTE — Progress Notes (Signed)
Anticoagulation Consult Note: Heparin Indication: hx of AVR  Heparin level = 0.40 on 1550 units/hr Goal heparin level = 0.3-0.7  Heparin level in within desired goal. Continue same rate. Resume daily AM heparin level and CBC.

## 2012-02-13 NOTE — Plan of Care (Signed)
Problem: Phase I Progression Outcomes Goal: Initial discharge plan identified Outcome: Completed/Met Date Met:  02/13/12 To return home with wife

## 2012-02-13 NOTE — Transfer of Care (Signed)
Immediate Anesthesia Transfer of Care Note  Patient: Brandon [name]  Procedure(s) Performed: Procedure(s) (LRB) with comments: LAPAROSCOPIC CHOLECYSTECTOMY (N/A)  Patient Location: PACU  Anesthesia Type:General  Level of Consciousness: awake, alert  and oriented  Airway & Oxygen Therapy: Patient Spontanous Breathing and Patient connected to nasal cannula oxygen  Post-op Assessment: Report given to PACU RN  Post vital signs: Reviewed and stable  Complications: No apparent anesthesia complications

## 2012-02-13 NOTE — Progress Notes (Signed)
Patient ID: Brandon [name], male   DOB: 12/12/1965, 46 y.o.   MRN: [id number] 1 Day Post-Op  Subjective:  Resting in bed but rather stiffly. Remarks did not sleep all night because of pain and that pain medicine does not help at all. Says he is slightly nauseous but this is chronic for him.   Objective: Vital signs in last 24 hours: Temp:  [97.4 F (36.3 C)-99.3 F (37.4 C)] 99.3 F (37.4 C) (12/05 0500) Pulse Rate:  [76-101] 76  (12/05 0500) Resp:  [12-18] 18  (12/05 0500) BP: (134-182)/(79-108) 164/92 mmHg (12/05 0500) SpO2:  [97 %-100 %] 98 % (12/05 0500) Last BM Date: 02/11/12  Intake/Output from previous day: 12/04 0701 - 12/05 0700 In: 2923 [P.O.:820; I.V.:2093; IV Piggyback:10] Out: 1350 [Urine:1300; Blood:50] Intake/Output this shift:    General appearance: alert, cooperative, appears stated age, fatigued and no distress Chest: CTA Cardiac: RRR Abdomen: Diffuse moderate abdominal tenderness worst in RLQ. Nondistended. Diminished but present bowel sounds.   Lab Results:   [facility] 02/13/12 0441 02/12/12 0547  WBC 5.8 4.3  HGB 9.9* 8.7*  HCT 29.6* 25.6*  PLT 239 274   BMET  Basename 02/13/12 0441 02/12/12 0547  NA 139 138  K 5.0 4.0  CL 104 105  CO2 25 26  GLUCOSE 101* 90  BUN 5* 5*  CREATININE 1.31 1.28  CALCIUM 9.3 9.1   PT/INR  Basename 02/13/12 0441 02/12/12 0547  LABPROT 16.2* 18.0*  INR 1.33 1.54*   ABG No results found for this basename: PHART:2,PCO2:2,PO2:2,HCO3:2 in the last 72 hours  Studies/Results: Dg Cholangiogram Operative  02/12/2012  *RADIOLOGY REPORT*  Intraoperative cholangiogram  History:  Cholelithiasis  Findings:  The gallbladder has been removed, and the cystic duct has been cannulated.  The portions of the intrahepatic biliary ductal system which are visualized appear normal.  The common hepatic and common bile ducts appear normal.  No mass or calculus seen.  There is apparent free flow of contrast via the common bile duct into  the duodenum.  Conclusion:  No abnormality noted.   Original Report Authenticated By: Bretta Bang, M.D.     Anti-infectives: Anti-infectives     Start     Dose/Rate Route Frequency Ordered Stop   02/12/12 1700   tenofovir (VIREAD) tablet 300 mg  Status:  Discontinued        300 mg Oral Daily 02/12/12 1652 02/12/12 1656   02/12/12 1700   darunavir (PREZISTA) tablet 600 mg  Status:  Discontinued        600 mg Oral 2 times daily with meals 02/12/12 1652 02/12/12 1656   02/12/12 0000   levofloxacin (LEVAQUIN) IVPB 750 mg     Comments: Please give on call to operating room.   Thank you      750 mg 100 mL/hr over 90 Minutes Intravenous  Once 02/11/12 1015 02/12/12 1300   02/10/12 1700   darunavir (PREZISTA) tablet 300 mg  Status:  Discontinued        300 mg Oral 2 times daily with meals 02/10/12 1027 02/10/12 1057   02/10/12 1700   darunavir (PREZISTA) tablet 600 mg        600 mg Oral 2 times daily with meals 02/10/12 1058     02/10/12 1400   DAPTOmycin (CUBICIN) 500 mg in sodium chloride 0.9 % IVPB        500 mg 220 mL/hr over 30 Minutes Intravenous Every 24 hours 02/10/12 0233 02/18/12 1359   02/10/12 1200  tenofovir (VIREAD) tablet 300 mg        300 mg Oral Daily 02/10/12 1058     02/10/12 1000   dapsone tablet 100 mg        100 mg Oral Daily 02/10/12 0108     02/10/12 1000   valACYclovir (VALTREX) tablet 1,000 mg        1,000 mg Oral Daily 02/10/12 0108     02/10/12 1000   azithromycin (ZITHROMAX) tablet 1,200 mg        1,200 mg Oral Every 7 days 02/10/12 0108     02/10/12 0800   ciprofloxacin (CIPRO) tablet 500 mg        500 mg Oral 2 times daily 02/10/12 0107 02/18/12 0759   02/10/12 0800   ritonavir (NORVIR) tablet 100 mg        100 mg Oral 2 times daily with meals 02/10/12 0107     02/10/12 0200   zidovudine (RETROVIR) capsule 300 mg        300 mg Oral 2 times daily 02/10/12 0107     02/10/12 0200   raltegravir (ISENTRESS) tablet 400 mg  Status:   Discontinued        400 mg Oral 2 times daily 02/10/12 0107 02/10/12 1057   02/10/12 0200   maraviroc (SELZENTRY) tablet 150 mg  Status:  Discontinued        150 mg Oral 2 times daily 02/10/12 0107 02/10/12 1057   02/10/12 0115   DAPTOmycin (CUBICIN) injection 500 mg  Status:  Discontinued        500 mg Intravenous 1 Day/Dose 02/10/12 0108 02/10/12 0233          Assessment/Plan:  Patient Active Problem List  Diagnosis  . Acute pancreatitis  . Nausea & vomiting  . Abdominal pain  . Anemia  . S/P AVR (aortic valve replacement)  . Septic arthritis of knee  . Warfarin-induced coagulopathy  . HIV disease   s/p Procedure(s) (LRB) with comments: LAPAROSCOPIC CHOLECYSTECTOMY (N/A) with unremarkable IOC. POD #1  Plan: 1. Due to worsening pain, will increase Dilaudid to 1-2mg  q2 hours and increase oxycodone.  2. Will also check LFTs and lipase at this time.  3. Advance diet to low fat  4. Encouraged mobilization, may need nursing or PT to encourage   LOS: 4 days    Tana Conch, MD, PGY2  02/13/2012

## 2012-02-13 NOTE — Progress Notes (Signed)
PT Cancellation Note  Patient Details Name: Brandon [name] MRN: [id number] DOB: 1966/02/26   Cancelled Treatment:    Reason Eval/Treat Not Completed: Pt amb in halls earlier with nursing without problems. Pt at baseline. No PT eval needed.   Emaley Applin 02/13/2012, 3:34 PM

## 2012-02-14 ENCOUNTER — Encounter (HOSPITAL_COMMUNITY): Payer: Self-pay | Admitting: Anesthesiology

## 2012-02-14 DIAGNOSIS — Z9089 Acquired absence of other organs: Secondary | ICD-10-CM

## 2012-02-14 DIAGNOSIS — I10 Essential (primary) hypertension: Secondary | ICD-10-CM

## 2012-02-14 DIAGNOSIS — R5381 Other malaise: Secondary | ICD-10-CM

## 2012-02-14 LAB — CBC
HCT: 25.6 % — ABNORMAL LOW (ref 39.0–52.0)
Platelets: 189 10*3/uL (ref 150–400)
RDW: 18.1 % — ABNORMAL HIGH (ref 11.5–15.5)
WBC: 5.3 10*3/uL (ref 4.0–10.5)

## 2012-02-14 LAB — PROTIME-INR
INR: 1.56 — ABNORMAL HIGH (ref 0.00–1.49)
Prothrombin Time: 18.2 seconds — ABNORMAL HIGH (ref 11.6–15.2)

## 2012-02-14 LAB — COMPREHENSIVE METABOLIC PANEL
AST: 29 U/L (ref 0–37)
Albumin: 2.7 g/dL — ABNORMAL LOW (ref 3.5–5.2)
Calcium: 9 mg/dL (ref 8.4–10.5)
Chloride: 105 mEq/L (ref 96–112)
Creatinine, Ser: 1.4 mg/dL — ABNORMAL HIGH (ref 0.50–1.35)
Total Protein: 6.9 g/dL (ref 6.0–8.3)

## 2012-02-14 LAB — HEPARIN LEVEL (UNFRACTIONATED): Heparin Unfractionated: 0.46 IU/mL (ref 0.30–0.70)

## 2012-02-14 MED ORDER — HYDROCHLOROTHIAZIDE 25 MG PO TABS: 25.0000 mg | ORAL_TABLET | Freq: Every day | ORAL | Status: DC

## 2012-02-14 MED ORDER — ISOSORB DINITRATE-HYDRALAZINE 20-37.5 MG PO TABS: 1.0000 | ORAL_TABLET | Freq: Two times a day (BID) | ORAL | Status: DC

## 2012-02-14 MED ORDER — ENOXAPARIN SODIUM 80 MG/0.8ML ~~LOC~~ SOLN: 80.0000 mg | Freq: Every day | SUBCUTANEOUS | Status: DC

## 2012-02-14 MED ORDER — WARFARIN SODIUM 6 MG PO TABS: 6.0000 mg | ORAL_TABLET | Freq: Every day | ORAL | Status: DC

## 2012-02-14 MED ORDER — HEPARIN SOD (PORK) LOCK FLUSH 100 UNIT/ML IV SOLN
250.0000 [IU] | INTRAVENOUS | Status: AC | PRN
  Administered 2012-02-14: 500 [IU]

## 2012-02-14 MED ORDER — WARFARIN SODIUM 7.5 MG PO TABS
7.5000 mg | ORAL_TABLET | Freq: Once | ORAL | Status: DC
  Filled 2012-02-14: qty 1

## 2012-02-14 MED ORDER — OXYCODONE HCL 15 MG PO TABS
15.0000 mg | ORAL_TABLET | Freq: Three times a day (TID) | ORAL | Status: DC | PRN
Start: 2012-02-14 — End: 2012-03-12

## 2012-02-14 NOTE — Progress Notes (Signed)
ANTICOAGULATION CONSULT NOTE   Pharmacy Consult for Heparin, coumadin Indication: hx of AVR  Allergies  Allergen Reactions  . Bee Venom Anaphylaxis  . Sulfa Antibiotics Anaphylaxis  . Truvada (Emtricitabine-Tenofovir) Anaphylaxis  . Lidoderm (Lidocaine) Other (See Comments)    Reaction unknown  Patient Measurements: Height: 5\' 9"  (175.3 cm) Weight: 120 lb 5.9 oz (54.6 kg) IBW/kg (Calculated) : 70.7  Heparin Dosing Weight: 58 kg  Vital Signs: Temp: 98.8 F (37.1 C) (12/06 0357) Temp src: Oral (12/06 0357) BP: 122/67 mmHg (12/06 0357) Pulse Rate: 96  (12/06 0357) Labs:  Basename 02/14/12 0546 02/13/12 1500 02/13/12 0441 02/12/12 0547  HGB 8.6* -- 9.9* --  HCT 25.6* -- 29.6* 25.6*  PLT 189 -- 239 274  APTT -- -- -- --  LABPROT 18.2* -- 16.2* 18.0*  INR 1.56* -- 1.33 1.54*  HEPARINUNFRC 0.46 0.40 0.27* --  CREATININE 1.40* -- 1.31 1.28  CKTOTAL -- -- -- --  CKMB -- -- -- --  TROPONINI -- -- -- --   Estimated Creatinine Clearance: 52 ml/min (by C-G formula based on Cr of 1.4).  Assessment: 46 YO Male with hx of AVR s/p cholecystectomy 12/4, for anticoagulation.  Goal of Therapy:  Heparin level 0.3-0.7 units/ml INR 2.5-3.5 Monitor platelets by anticoagulation protocol: Yes   Plan:  -Continue Heparin 1550 units/hr -Coumadin 7.5 mg today -Daily PT/INR, heparin level and CBC  Harland German, Pharm D 02/14/2012 8:02 AM

## 2012-02-14 NOTE — Progress Notes (Signed)
Nutrition Follow-up  Intervention:   1. No new nutrition interventions at this time  2. RD will continue to follow    Assessment:   S/p lap choly on 12/4, diet advanced to low fat 12/5.  Pt reports appetite is better, ate well this morning.   Diet Order:  Low Fat Supplements: Resource Breeze po TID, each supplement provides 250 kcal and 9 grams of protein.   Meds: Scheduled Meds:   . azithromycin  1,200 mg Oral Q7 days  . calcium-vitamin D  1 tablet Oral QAC breakfast  . ciprofloxacin  500 mg Oral BID  . dapsone  100 mg Oral Daily  . DAPTOmycin (CUBICIN)  IV  500 mg Intravenous Q24H  . darunavir  600 mg Oral BID WC  . feeding supplement  1 Container Oral TID BM  . folic acid  1 mg Oral Daily  . hydrochlorothiazide  25 mg Oral Daily  . isosorbide-hydrALAZINE  1 tablet Oral BID  . mirtazapine  30 mg Oral QHS  . nystatin  5 mL Oral QID  . pantoprazole (PROTONIX) IV  40 mg Intravenous Q12H  . ritonavir  100 mg Oral BID WC  . tenofovir  300 mg Oral Daily  . valACYclovir  1,000 mg Oral Daily  . vitamin B-12  1,000 mcg Oral Daily  . [COMPLETED] warfarin  7.5 mg Oral ONCE-1800  . warfarin  7.5 mg Oral ONCE-1800  . Warfarin - Pharmacist Dosing Inpatient   Does not apply q1800  . zidovudine  300 mg Oral BID   Continuous Infusions:   . sodium chloride 100 mL/hr at 02/14/12 0930  . heparin 1,550 Units/hr (02/14/12 0623)   PRN Meds:.acetaminophen, acetaminophen, alum & mag hydroxide-simeth, hydrALAZINE, HYDROmorphone (DILAUDID) injection, ondansetron (ZOFRAN) IV, ondansetron, oxyCODONE, phenol, prochlorperazine, sodium chloride, zolpidem   CMP     Component Value Date/Time   NA 138 02/14/2012 0546   K 4.2 02/14/2012 0546   CL 105 02/14/2012 0546   CO2 26 02/14/2012 0546   GLUCOSE 106* 02/14/2012 0546   BUN 6 02/14/2012 0546   CREATININE 1.40* 02/14/2012 0546   CALCIUM 9.0 02/14/2012 0546   PROT 6.9 02/14/2012 0546   ALBUMIN 2.7* 02/14/2012 0546   AST 29 02/14/2012 0546   ALT 20  02/14/2012 0546   ALKPHOS 80 02/14/2012 0546   BILITOT 0.4 02/14/2012 0546   GFRNONAA 60* 02/14/2012 0546   GFRAA 69* 02/14/2012 0546    CBG (last 3)  No results found for this basename: GLUCAP:3 in the last 72 hours   Intake/Output Summary (Last 24 hours) at 02/14/12 0932 Last data filed at 02/14/12 5409  Gross per 24 hour  Intake 2744.27 ml  Output    701 ml  Net 2043.27 ml    Weight Status:  120 lbs, down from 127 lbs at admission.   Re-estimated needs:  1800-2000 kcal, 75-85 gm protein daily   Nutrition Dx:  Inadequate oral intake r/t poor appetite and dislike of meals provided AEB weight loss.   --ongoing  Goal:  Diet advance to meet >/=90% estimated nutrition needs.  -unmet  Monitor:  PO intake, weight   Clarene Duke RD, LDN Pager [phone] After Hours pager [phone]

## 2012-02-14 NOTE — Progress Notes (Signed)
NURSING PROGRESS NOTE  Coby Shrewsberry [id number] Discharge Data: 02/14/2012 3:53 PM Attending Provider: No att. providers found [id number], Almyra Brace, MD     Beatriz Chancellor to be D/C'd Home per MD order.  Discussed with the patient the After Visit Summary and all questions fully answered. All IV's discontinued with no bleeding noted. All belongings returned to patient for patient to take home.   Last Vital Signs:  Blood pressure 145/81, pulse 120, temperature 98.8 F (37.1 C), temperature source Oral, resp. rate 18, height 5\' 9"  (1.753 m), weight 54.6 kg (120 lb 5.9 oz), SpO2 96.00%.  Discharge Medication List   Medication List     As of 02/14/2012  3:53 PM    STOP taking these medications         ibuprofen 800 MG tablet   Commonly known as: ADVIL,MOTRIN      oxyCODONE-acetaminophen 5-325 MG per tablet   Commonly known as: PERCOCET/ROXICET      TAKE these medications         azithromycin 600 MG tablet   Commonly known as: ZITHROMAX   Take 1,200 mg by mouth every 7 (seven) days.      ciprofloxacin 500 MG tablet   Commonly known as: CIPRO   Take 500 mg by mouth Every 12 hours. Take 1 tablet (500 mg total) by mouth every 12 hours for 15 days. Last dose on 02/18/12.      dapsone 100 MG tablet   Take 100 mg by mouth daily.      DAPTOmycin 500 MG injection   Commonly known as: CUBICIN   Inject 500 mg into the vein Daily. Infuse 500 mg into the vein daily for 15 doses. Last dose on 02/18/12.      darunavir 600 MG tablet   Commonly known as: PREZISTA   Take 600 mg by mouth 2 (two) times daily with a meal.      dronabinol 2.5 MG capsule   Commonly known as: MARINOL   Take 1 capsule by mouth BID times 48H. TAKE ONE CAPSULE BY MOUTH TWICE A DAY      enoxaparin 80 MG/0.8ML injection   Commonly known as: LOVENOX   Inject 0.8 mLs (80 mg total) into the skin daily.      EPIPEN IJ   Inject 1 application as directed once as needed. For sever allergic reaction      folic acid  1 MG tablet   Commonly known as: FOLVITE   Take 1 mg by mouth daily.      hydrochlorothiazide 25 MG tablet   Commonly known as: HYDRODIURIL   Take 1 tablet (25 mg total) by mouth daily.      isosorbide-hydrALAZINE 20-37.5 MG per tablet   Commonly known as: BIDIL   Take 1 tablet by mouth 2 (two) times daily.      mirtazapine 30 MG tablet   Commonly known as: REMERON   Take 30 mg by mouth at bedtime.      nystatin 100000 UNIT/ML suspension   Commonly known as: MYCOSTATIN   Take 5 mLs by mouth 4 times daily. Take 5 mLs (500,000 Units total) by mouth 4 times daily for 10 days. (swish and swallow each dose)      ondansetron 4 MG tablet   Commonly known as: ZOFRAN   Take 4 mg by mouth every 8 (eight) hours as needed. For nausea      oxyCODONE 15 MG immediate release tablet   Commonly known as: ROXICODONE  Take 1 tablet (15 mg total) by mouth every 8 (eight) hours as needed for pain. Take 1 tablet (15 mg total) by mouth every 8 (eight) hours as needed for 30 doses.      pantoprazole 20 MG tablet   Commonly known as: PROTONIX   Take 40 mg by mouth daily.      prochlorperazine 10 MG tablet   Commonly known as: COMPAZINE   Take 10 mg by mouth every 6 (six) hours as needed. For nausea      ritonavir 100 MG capsule   Commonly known as: NORVIR   Take 100 mg by mouth 2 (two) times daily.      tenofovir 300 MG tablet   Commonly known as: VIREAD   Take 300 mg by mouth daily.      valACYclovir 1000 MG tablet   Commonly known as: VALTREX   Take 1,000 mg by mouth daily.      VIACTIV 500-500-40 MG-UNT-MCG Chew   Generic drug: Calcium-Vitamin D-Vitamin K   Chew 2 tablets by mouth Daily. Take 2 tablets by mouth daily.      vitamin B-12 1000 MCG tablet   Commonly known as: CYANOCOBALAMIN   Take 1,000 mcg by mouth daily.      warfarin 6 MG tablet   Commonly known as: COUMADIN   Take 1 tablet (6 mg total) by mouth daily.      zidovudine 300 MG tablet   Commonly known as: RETROVIR    Take 300 mg by mouth 2 (two) times daily.        Harriett Azar, Elmarie Mainland, RN

## 2012-02-14 NOTE — Progress Notes (Signed)
Patient ID: Brandon [name], male   DOB: Feb 14, 1966, 46 y.o.   MRN: [id number] 2 Days Post-Op  Subjective:  Sitting on side of bed eating low fat breakfast. No complaints of vomiting. Does continue to have slight but chronic nausea. Was able to sleep better last night. Pain better controlled. Had BM last night.    Objective: Vital signs in last 24 hours: Temp:  [98.8 F (37.1 C)-99.2 F (37.3 C)] 98.8 F (37.1 C) (12/06 0357) Pulse Rate:  [88-96] 96  (12/06 0357) Resp:  [18-19] 18  (12/06 0357) BP: (122-167)/(67-93) 122/67 mmHg (12/06 0357) SpO2:  [95 %-97 %] 95 % (12/06 0357) Last BM Date: 02/11/12  Intake/Output from previous day: 12/05 0701 - 12/06 0700 In: 2754.3 [I.V.:2744.3; IV Piggyback:10] Out: 1076 [Urine:1075; Stool:1] Intake/Output this shift:    General appearance: alert, cooperative, appears stated age, fatigued and no distress Abdomen: appropriate tenderness. Surgical sites c/d/i. Nondistended.   Lab Results:   Basename 02/14/12 0546 02/13/12 0441  WBC 5.3 5.8  HGB 8.6* 9.9*  HCT 25.6* 29.6*  PLT 189 239  H+H likely falsely elevated yesterday. At baseline today.   BMET  Basename 02/14/12 0546 02/13/12 0441  NA 138 139  K 4.2 5.0  CL 105 104  CO2 26 25  GLUCOSE 106* 101*  BUN 6 5*  CREATININE 1.40* 1.31  CALCIUM 9.0 9.3  Cr slightly up-managed per medicine.   PT/INR  Basename 02/14/12 0546 02/13/12 0441  LABPROT 18.2* 16.2*  INR 1.56* 1.33  INR managed by medicine for mechanical valve.   ABG No results found for this basename: PHART:2,PCO2:2,PO2:2,HCO3:2 in the last 72 hours  Studies/Results: Dg Cholangiogram Operative  02/12/2012  *RADIOLOGY REPORT*  Intraoperative cholangiogram  History:  Cholelithiasis  Findings:  The gallbladder has been removed, and the cystic duct has been cannulated.  The portions of the intrahepatic biliary ductal system which are visualized appear normal.  The common hepatic and common bile ducts appear normal.  No  mass or calculus seen.  There is apparent free flow of contrast via the common bile duct into the duodenum.  Conclusion:  No abnormality noted.   Original Report Authenticated By: Bretta Bang, M.D.     Anti-infectives: Anti-infectives     Start     Dose/Rate Route Frequency Ordered Stop   02/12/12 1700   tenofovir (VIREAD) tablet 300 mg  Status:  Discontinued        300 mg Oral Daily 02/12/12 1652 02/12/12 1656   02/12/12 1700   darunavir (PREZISTA) tablet 600 mg  Status:  Discontinued        600 mg Oral 2 times daily with meals 02/12/12 1652 02/12/12 1656   02/12/12 0000   levofloxacin (LEVAQUIN) IVPB 750 mg     Comments: Please give on call to operating room.   Thank you      750 mg 100 mL/hr over 90 Minutes Intravenous  Once 02/11/12 1015 02/12/12 1300   02/10/12 1700   darunavir (PREZISTA) tablet 300 mg  Status:  Discontinued        300 mg Oral 2 times daily with meals 02/10/12 1027 02/10/12 1057   02/10/12 1700   darunavir (PREZISTA) tablet 600 mg        600 mg Oral 2 times daily with meals 02/10/12 1058     02/10/12 1400   DAPTOmycin (CUBICIN) 500 mg in sodium chloride 0.9 % IVPB        500 mg 220 mL/hr over 30 Minutes Intravenous  Every 24 hours 02/10/12 0233 02/18/12 1359   02/10/12 1200   tenofovir (VIREAD) tablet 300 mg        300 mg Oral Daily 02/10/12 1058     02/10/12 1000   dapsone tablet 100 mg        100 mg Oral Daily 02/10/12 0108     02/10/12 1000   valACYclovir (VALTREX) tablet 1,000 mg        1,000 mg Oral Daily 02/10/12 0108     02/10/12 1000   azithromycin (ZITHROMAX) tablet 1,200 mg        1,200 mg Oral Every 7 days 02/10/12 0108     02/10/12 0800   ciprofloxacin (CIPRO) tablet 500 mg        500 mg Oral 2 times daily 02/10/12 0107 02/18/12 0759   02/10/12 0800   ritonavir (NORVIR) tablet 100 mg        100 mg Oral 2 times daily with meals 02/10/12 0107     02/10/12 0200   zidovudine (RETROVIR) capsule 300 mg        300 mg Oral 2 times daily  02/10/12 0107     02/10/12 0200   raltegravir (ISENTRESS) tablet 400 mg  Status:  Discontinued        400 mg Oral 2 times daily 02/10/12 0107 02/10/12 1057   02/10/12 0200   maraviroc (SELZENTRY) tablet 150 mg  Status:  Discontinued        150 mg Oral 2 times daily 02/10/12 0107 02/10/12 1057   02/10/12 0115   DAPTOmycin (CUBICIN) injection 500 mg  Status:  Discontinued        500 mg Intravenous 1 Day/Dose 02/10/12 0108 02/10/12 0233          Assessment/Plan:  Patient Active Problem List  Diagnosis  . Acute pancreatitis  . Nausea & vomiting  . Abdominal pain  . Anemia  . S/P AVR (aortic valve replacement)  . Septic arthritis of knee  . Warfarin-induced coagulopathy  . HIV disease   s/p Procedure(s) (LRB) with comments: LAPAROSCOPIC CHOLECYSTECTOMY (N/A) with unremarkable IOC. POD #2 Patient tolerating low fat diet.  Stable LFTs today as well as lipase yesterday/improved pain control/BM last night  Plan: 1. will sign off and defer further management to medicine/primary team.  2. Patient to follow up outpatient with [facility]. Info will be placed in discharge information.    LOS: 5 days    Tana Conch, MD, PGY2  02/14/2012

## 2012-02-14 NOTE — Progress Notes (Signed)
Doing better.  LFTs normal. D/C when medically stable per primary team.

## 2012-02-14 NOTE — Addendum Note (Signed)
Addendum  created 02/14/12 0607 by Carmela Rima, CRNA   Modules edited:Anesthesia Medication Administration

## 2012-02-14 NOTE — Addendum Note (Signed)
Addendum  created 02/14/12 0607 by Angelea Penny F Deyjah Kindel, CRNA   Modules edited:Anesthesia Medication Administration    

## 2012-02-14 NOTE — Discharge Summary (Signed)
Physician Discharge Summary  Brandon [name] ZOX:[id number] DOB: [birth year] DOA: 02/09/2012  PCP: [name], MD  Admit date: 02/09/2012 Discharge date: 02/14/2012  Time spent: > 30 minutes  Recommendations for Outpatient Follow-up:  -Follow with coumadin clinic next week -Keep yourself well hydrated -Arrange follow up with PCP and with Infectious disease service -Take medications as prescribed -Follow up with general surgery service as instructed.      -Follow a heart healthy diet.  Discharge Diagnoses:  Acute Gallstone pancreatitis S/P cholecystectomy Nausea & vomiting Abdominal pain Anemia of chronic disease (negative FOBT during hosp) S/P AVR (aortic valve replacement; on chronic anticoagulation) Septic arthritis of knee Chronic use of Warfarin HIV disease HTN   Discharge Condition: Stable and improved. Will discharge home with [facility] services and follow up with PCP and infectious disease services in 1-2 weeks. Patient advised to follow with medications and discharge instructions as prescribed. General surgery service will arrange follow up for him as an outpatient as well. At discharge tolerating full diet, no nausea no vomiting and normal lipase and LFT's.  Diet recommendation: Heart healthy diet (low fat diet and low sodium)  Filed Weights   02/09/12 2117 02/09/12 2240  Weight: 58 kg (127 lb 13.9 oz) 54.6 kg (120 lb 5.9 oz)    History of present illness:    Hospital Course:  Abdominal pain/nausea/vomiting (chronic pancreatitis)  Likely due to gallstones.  -S/p lap cholecystectomy  -Lipase and LFT's WNL now -No vomiting and able to tolerates diet. -Will d/c home and he will follow with surgery service as an outpatient. -Patient advised to follow a low fat diet.  Hypertension  Much better now. -Will discharge on HCTZ and BIDIL. -Patient advised to follow a heart healthy diet (low sodium) -Will follow with PCP in 1-2 weeks. At that point will need reevaluation  of his VSa nd further medications adjustments as needed.  HIV/AIDS  Patient continued on his home anti-retroviral therapy. Continue valacyclovir, dapsone, and azithromycin to prevent opportunistic infections. Patient to continue to follow with infectious disease service at [facility].  Septic arthritis of the right knee  [facility], patient underwent irrigation debridement of right knee, patient was hospitalized from 01/20/2012 to 02/03/2012. Initially was on vancomycin and ceftriaxone, patient had a rash due to ceftriaxone which was changed to aztreonam. At the time of discharge patient was discharged on daptomycin and ciprofloxacin until 02/18/2012. Patient at this time not complaining of any knee pain.  -Continue current antibiotic regimen, to followup with orthopedic physician at [facility] as previously scheduled.   Chronic kidney disease stage I/recent history of acute on chronic renal failure during hospitalization at [facility]  -Recent creatinine peaked at 1.7.  -Patient chronic kidney disease secondary to HTN and HIV.  -Cr 1.31 and stable.  -BMET to be repeated during follow up appoinment to follow kidney function.  Anemia  Likely due to chronic disease. Anemia panel was suggestive of iron deficiency anemia. Received 1 unit of PRBC on 02/09/2012.  -Hgb stable at 9.9; no need for transfusion  -FOBT negative  -Continue monitoring   History of mechanical heart valve  -Continue coumadin and since INR is still subtherapeutic also lovenox shots for bridging. Goal is 2.5-3.5 -Patient will follow with his coumadin clinic next week for further coumadin dose adjustments.  Physical deconditioning -secondary to recent septic arthritis and now lap cholecystectomy. -Will discharge home and resume his [facility] services. -DME provided to assist with ADL's  GERD Continue Protonix.  *  Rest of medical problems remains stable and the plan is to  continue current medication regimen.  Procedures: S/P lap-cholecystectomy Images studies during admission as reported below.  Consultations:  [facility] (general surgery)  Discharge Exam: Filed Vitals:   02/13/12 0500 02/13/12 2115 02/14/12 0010 02/14/12 0357  BP: 164/92 167/93 151/87 122/67  Pulse: 76 93 88 96  Temp: 99.3 F (37.4 C) 99.2 F (37.3 C) 98.9 F (37.2 C) 98.8 F (37.1 C)  TempSrc: Oral Oral Oral Oral  Resp: 18 19 18 18   Height:      Weight:      SpO2: 98% 97% 96% 95%   General: sleepy after surgery; but easily arouse; NAD and no major complications from surgery  HEENT: EOMI.  Neck: Supple  CV: S1 and S2  Lungs: Clear to ascultation bilaterally  Abdomen: Soft, some mild tenderness to deep palpation in the epigastric region and RUQ; positive BS. Had BM's prior to discharge.  Ext: Good pulses. Trace edema. Right knee covered in bandages sutures in place does not appear to be infected, warm to touch and slightly swollen compared to left knee.  Discharge Instructions  Discharge Orders    Future Appointments: Provider: Department: Dept Phone: Center:   03/12/2012 2:30 PM Almond Lint, MD [facility], [place] [phone] None     Future Orders Please Complete By Expires   Diet - low sodium heart healthy      Home Health      Scheduling Instructions:   Resume services as prior to admission. Please make sure patient has a PT/INR checked on Monday.   Questions: Responses:   To provide the following care/treatments PT    OT    RN   Discharge instructions      Comments:   -Follow with coumadin clinic next week -Keep yourself well hydrated -Arrange follow up with PCP and with Infectious disease service -Take medications as prescribed -Follow up with general surgery service as instructed. -Follow a heart healthy diet.       Medication List     As of 02/14/2012  1:11 PM    STOP taking these medications         ibuprofen 800 MG tablet   Commonly known  as: ADVIL,MOTRIN      oxyCODONE-acetaminophen 5-325 MG per tablet   Commonly known as: PERCOCET/ROXICET      TAKE these medications         azithromycin 600 MG tablet   Commonly known as: ZITHROMAX   Take 1,200 mg by mouth every 7 (seven) days.      ciprofloxacin 500 MG tablet   Commonly known as: CIPRO   Take 500 mg by mouth Every 12 hours. Take 1 tablet (500 mg total) by mouth every 12 hours for 15 days. Last dose on 02/18/12.      dapsone 100 MG tablet   Take 100 mg by mouth daily.      DAPTOmycin 500 MG injection   Commonly known as: CUBICIN   Inject 500 mg into the vein Daily. Infuse 500 mg into the vein daily for 15 doses. Last dose on 02/18/12.      darunavir 600 MG tablet   Commonly known as: PREZISTA   Take 600 mg by mouth 2 (two) times daily with a meal.      dronabinol 2.5 MG capsule   Commonly known as: MARINOL   Take 1 capsule by mouth BID times 48H. TAKE ONE CAPSULE BY MOUTH TWICE A DAY  enoxaparin 80 MG/0.8ML injection   Commonly known as: LOVENOX   Inject 0.8 mLs (80 mg total) into the skin daily.      EPIPEN IJ   Inject 1 application as directed once as needed. For sever allergic reaction      folic acid 1 MG tablet   Commonly known as: FOLVITE   Take 1 mg by mouth daily.      hydrochlorothiazide 25 MG tablet   Commonly known as: HYDRODIURIL   Take 1 tablet (25 mg total) by mouth daily.      isosorbide-hydrALAZINE 20-37.5 MG per tablet   Commonly known as: BIDIL   Take 1 tablet by mouth 2 (two) times daily.      mirtazapine 30 MG tablet   Commonly known as: REMERON   Take 30 mg by mouth at bedtime.      nystatin 100000 UNIT/ML suspension   Commonly known as: MYCOSTATIN   Take 5 mLs by mouth 4 times daily. Take 5 mLs (500,000 Units total) by mouth 4 times daily for 10 days. (swish and swallow each dose)      ondansetron 4 MG tablet   Commonly known as: ZOFRAN   Take 4 mg by mouth every 8 (eight) hours as needed. For nausea       oxyCODONE 15 MG immediate release tablet   Commonly known as: ROXICODONE   Take 1 tablet (15 mg total) by mouth every 8 (eight) hours as needed for pain. Take 1 tablet (15 mg total) by mouth every 8 (eight) hours as needed for 30 doses.      pantoprazole 20 MG tablet   Commonly known as: PROTONIX   Take 40 mg by mouth daily.      prochlorperazine 10 MG tablet   Commonly known as: COMPAZINE   Take 10 mg by mouth every 6 (six) hours as needed. For nausea      ritonavir 100 MG capsule   Commonly known as: NORVIR   Take 100 mg by mouth 2 (two) times daily.      tenofovir 300 MG tablet   Commonly known as: VIREAD   Take 300 mg by mouth daily.      valACYclovir 1000 MG tablet   Commonly known as: VALTREX   Take 1,000 mg by mouth daily.      VIACTIV 500-500-40 MG-UNT-MCG Chew   Generic drug: Calcium-Vitamin D-Vitamin K   Chew 2 tablets by mouth Daily. Take 2 tablets by mouth daily.      vitamin B-12 1000 MCG tablet   Commonly known as: CYANOCOBALAMIN   Take 1,000 mcg by mouth daily.      warfarin 6 MG tablet   Commonly known as: COUMADIN   Take 1 tablet (6 mg total) by mouth daily.      zidovudine 300 MG tablet   Commonly known as: RETROVIR   Take 300 mg by mouth 2 (two) times daily.           Follow-up Information    Follow up with [name], MD. Schedule an appointment as soon as possible for a visit in 1 week.   Contact information:   [address] [place] [address] [phone]          The results of significant diagnostics from this hospitalization (including imaging, microbiology, ancillary and laboratory) are listed below for reference.    Significant Diagnostic Studies: Dg Chest 2 View  02/09/2012  *RADIOLOGY REPORT*  Clinical Data: Fever.  Abdominal  pain.  Nausea and vomiting.  CHEST - 2 VIEW  Comparison: None.  Findings: Prior sternotomy for aortic valve replacement.  Cardiac silhouette normal in size.  Prominent main pulmonary trunk and  left pulmonary artery.  Hilar and mediastinal contours otherwise unremarkable.  Lungs clear.  Bronchovascular markings normal. Pulmonary vascularity normal.  No pneumothorax.  No pleural effusions.  Visualized bony thorax intact.  Left arm PICC tip in the lower SVC at the cavoatrial junction.  IMPRESSION: No acute cardiopulmonary disease.   Original Report Authenticated By: Hulan Saas, M.D.    Dg Cholangiogram Operative  02/12/2012  *RADIOLOGY REPORT*  Intraoperative cholangiogram  History:  Cholelithiasis  Findings:  The gallbladder has been removed, and the cystic duct has been cannulated.  The portions of the intrahepatic biliary ductal system which are visualized appear normal.  The common hepatic and common bile ducts appear normal.  No mass or calculus seen.  There is apparent free flow of contrast via the common bile duct into the duodenum.  Conclusion:  No abnormality noted.   Original Report Authenticated By: Bretta Bang, M.D.    US Abdomen Complete  02/09/2012  *RADIOLOGY REPORT*  Clinical Data:  Abdominal pain.  Vomiting.  COMPLETE ABDOMINAL ULTRASOUND  Comparison:  CT scan of the abdomen dated 04/12/2011  Findings:  Gallbladder:  There are several small mobile gallstones. Gallbladder wall is not thickened.  Negative sonographic Murphy's sign.  Common bile duct:  Normal.  3.8 mm maximum diameter.  Liver:  1.3 cm echogenic lesion in the lateral aspect of the left lobe of the liver consistent with a benign hemangioma.  Otherwise normal.  IVC:  Normal.  Pancreas:  Normal.  Spleen:  Normal.7.7 cm in length.  Right Kidney:  Normal.  10.7 cm in length.  Left Kidney:  Normal.  0.3 cm in length.  Abdominal aorta:  No aneurysm.  Maximal diameter 2.2 cm.  Calcified plaque in the mid abdominal aorta.  IMPRESSION: Several small mobile gallstones. Small hemangioma in the left lobe of the liver.   Original Report Authenticated By: Francene Boyers, M.D.    Dg Knee Complete 4 Views Right  01/15/2012   *RADIOLOGY REPORT*  Clinical Data: Severe right knee pain over the past 2 days.  No known injuries.  RIGHT KNEE - COMPLETE 4+ VIEW  Comparison: None.  Findings: No evidence of acute or subacute fracture or dislocation. Well-preserved joint spaces.  No intrinsic osseous abnormalities. No evidence of a significant joint effusion.  IMPRESSION: Normal examination.   Original Report Authenticated By: Hulan Saas, M.D.     Microbiology: Recent Results (from the past 240 hour(s))  MRSA PCR SCREENING     Status: Normal   Collection Time   02/09/12 11:16 PM      Component Value Range Status Comment   MRSA by PCR NEGATIVE  NEGATIVE Final   SURGICAL PCR SCREEN     Status: Normal   Collection Time   02/11/12  4:53 PM      Component Value Range Status Comment   MRSA, PCR NEGATIVE  NEGATIVE Final    Staphylococcus aureus NEGATIVE  NEGATIVE Final      Labs: Basic Metabolic Panel:  Lab 02/14/12 1308 02/13/12 0441 02/12/12 0547 02/11/12 0510 02/10/12 0505  NA 138 139 138 139 139  K 4.2 5.0 4.0 4.0 4.0  CL 105 104 105 106 105  CO2 26 25 26 25 26   GLUCOSE 106* 101* 90 74 89  BUN 6 5* 5* 7 10  CREATININE  1.40* 1.31 1.28 1.25 1.24  CALCIUM 9.0 9.3 9.1 8.3* 8.8  MG -- -- -- -- --  PHOS -- -- -- -- --   Liver Function Tests:  Lab 02/14/12 0546 02/13/12 1030 02/11/12 0510 02/09/12 1825  AST 29 40* 24 29  ALT 20 24 17 23   ALKPHOS 80 88 86 104  BILITOT 0.4 0.4 0.3 0.3  PROT 6.9 7.3 6.6 7.7  ALBUMIN 2.7* 2.8* 2.5* 3.1*    Lab 02/13/12 1030 02/11/12 0510 02/09/12 1825  LIPASE 21 81* 76*  AMYLASE -- -- --   CBC:  Lab 02/14/12 0546 02/13/12 0441 02/12/12 0547 02/11/12 0510 02/10/12 0930 02/09/12 1825  WBC 5.3 5.8 4.3 5.3 5.6 --  NEUTROABS -- -- -- -- -- 4.2  HGB 8.6* 9.9* 8.7* 8.6* 8.2* --  HCT 25.6* 29.6* 25.6* 25.1* 23.8* --  MCV 95.9 96.4 95.9 95.4 93.3 --  PLT 189 239 274 312 339 --   Cardiac Enzymes:  Lab 02/11/12 0510  CKTOTAL 150  CKMB --  CKMBINDEX --  TROPONINI --     Signed:  Lori Popowski  [facility] 02/14/2012, 1:11 PM

## 2012-02-19 ENCOUNTER — Inpatient Hospital Stay (HOSPITAL_COMMUNITY)
Admission: EM | Admit: 2012-02-19 | Discharge: 2012-02-21 | DRG: 438 | Disposition: A | Discharge status: Home / Self Care | Payer: Medicaid Other | Attending: Internal Medicine | Admitting: Internal Medicine

## 2012-02-19 ENCOUNTER — Encounter (HOSPITAL_COMMUNITY): Payer: Self-pay | Admitting: Family Medicine

## 2012-02-19 DIAGNOSIS — M009 Pyogenic arthritis, unspecified: Secondary | ICD-10-CM | POA: Diagnosis present

## 2012-02-19 DIAGNOSIS — Z8673 Personal history of transient ischemic attack (TIA), and cerebral infarction without residual deficits: Secondary | ICD-10-CM

## 2012-02-19 DIAGNOSIS — T45515A Adverse effect of anticoagulants, initial encounter: Secondary | ICD-10-CM

## 2012-02-19 DIAGNOSIS — M129 Arthropathy, unspecified: Secondary | ICD-10-CM | POA: Diagnosis present

## 2012-02-19 DIAGNOSIS — Z954 Presence of other heart-valve replacement: Secondary | ICD-10-CM

## 2012-02-19 DIAGNOSIS — N189 Chronic kidney disease, unspecified: Secondary | ICD-10-CM | POA: Diagnosis present

## 2012-02-19 DIAGNOSIS — I129 Hypertensive chronic kidney disease with stage 1 through stage 4 chronic kidney disease, or unspecified chronic kidney disease: Secondary | ICD-10-CM | POA: Diagnosis present

## 2012-02-19 DIAGNOSIS — K859 Acute pancreatitis without necrosis or infection, unspecified: Secondary | ICD-10-CM

## 2012-02-19 DIAGNOSIS — D649 Anemia, unspecified: Secondary | ICD-10-CM | POA: Diagnosis present

## 2012-02-19 DIAGNOSIS — R109 Unspecified abdominal pain: Secondary | ICD-10-CM | POA: Diagnosis present

## 2012-02-19 DIAGNOSIS — E860 Dehydration: Secondary | ICD-10-CM | POA: Diagnosis present

## 2012-02-19 DIAGNOSIS — B20 Human immunodeficiency virus [HIV] disease: Secondary | ICD-10-CM | POA: Diagnosis present

## 2012-02-19 DIAGNOSIS — Z7901 Long term (current) use of anticoagulants: Secondary | ICD-10-CM

## 2012-02-19 DIAGNOSIS — E876 Hypokalemia: Secondary | ICD-10-CM | POA: Diagnosis present

## 2012-02-19 DIAGNOSIS — K861 Other chronic pancreatitis: Principal | ICD-10-CM | POA: Diagnosis present

## 2012-02-19 DIAGNOSIS — R112 Nausea with vomiting, unspecified: Secondary | ICD-10-CM

## 2012-02-19 DIAGNOSIS — Z952 Presence of prosthetic heart valve: Secondary | ICD-10-CM

## 2012-02-19 LAB — CBC WITH DIFFERENTIAL/PLATELET
Basophils Absolute: 0.1 10*3/uL (ref 0.0–0.1)
Basophils Relative: 1 % (ref 0–1)
Eosinophils Absolute: 0.2 10*3/uL (ref 0.0–0.7)
Eosinophils Relative: 3 % (ref 0–5)
HCT: 28.5 % — ABNORMAL LOW (ref 39.0–52.0)
Hemoglobin: 9.7 g/dL — ABNORMAL LOW (ref 13.0–17.0)
Lymphocytes Relative: 37 % (ref 12–46)
Lymphs Abs: 3 10*3/uL (ref 0.7–4.0)
MCH: 33 pg (ref 26.0–34.0)
MCHC: 34 g/dL (ref 30.0–36.0)
MCV: 96.9 fL (ref 78.0–100.0)
Monocytes Absolute: 0.7 10*3/uL (ref 0.1–1.0)
Monocytes Relative: 9 % (ref 3–12)
Neutro Abs: 4.2 10*3/uL (ref 1.7–7.7)
Neutrophils Relative %: 51 % (ref 43–77)
Platelets: 226 10*3/uL (ref 150–400)
RBC: 2.94 MIL/uL — ABNORMAL LOW (ref 4.22–5.81)
RDW: 18.9 % — ABNORMAL HIGH (ref 11.5–15.5)
WBC: 8.1 10*3/uL (ref 4.0–10.5)

## 2012-02-19 LAB — COMPREHENSIVE METABOLIC PANEL
ALT: 16 U/L (ref 0–53)
AST: 26 U/L (ref 0–37)
Albumin: 3.7 g/dL (ref 3.5–5.2)
Alkaline Phosphatase: 94 U/L (ref 39–117)
BUN: 25 mg/dL — ABNORMAL HIGH (ref 6–23)
CO2: 31 mEq/L (ref 19–32)
Calcium: 9.4 mg/dL (ref 8.4–10.5)
Chloride: 97 mEq/L (ref 96–112)
Creatinine, Ser: 1.44 mg/dL — ABNORMAL HIGH (ref 0.50–1.35)
GFR calc Af Amer: 67 mL/min — ABNORMAL LOW (ref >90)
GFR calc non Af Amer: 58 mL/min — ABNORMAL LOW (ref >90)
Glucose, Bld: 96 mg/dL (ref 70–99)
Potassium: 3.2 mEq/L — ABNORMAL LOW (ref 3.5–5.1)
Sodium: 139 mEq/L (ref 135–145)
Total Bilirubin: 0.3 mg/dL (ref 0.3–1.2)
Total Protein: 8.7 g/dL — ABNORMAL HIGH (ref 6.0–8.3)

## 2012-02-19 LAB — LIPASE, BLOOD: Lipase: 83 U/L — ABNORMAL HIGH (ref 11–59)

## 2012-02-19 LAB — URINALYSIS, ROUTINE W REFLEX MICROSCOPIC
Bilirubin Urine: NEGATIVE
Glucose, UA: NEGATIVE mg/dL
Hgb urine dipstick: NEGATIVE
Ketones, ur: NEGATIVE mg/dL
Leukocytes, UA: NEGATIVE
Nitrite: NEGATIVE
Protein, ur: 30 mg/dL — AB
Specific Gravity, Urine: 1.022 (ref 1.005–1.030)
Urobilinogen, UA: 0.2 mg/dL (ref 0.0–1.0)
pH: 7 (ref 5.0–8.0)

## 2012-02-19 LAB — URINE MICROSCOPIC-ADD ON

## 2012-02-19 LAB — PROTIME-INR: INR: 2.05 — ABNORMAL HIGH (ref 0.00–1.49)

## 2012-02-19 MED ORDER — ONDANSETRON HCL 4 MG/2ML IJ SOLN
4.0000 mg | Freq: Once | INTRAMUSCULAR | Status: AC
Start: 2012-02-19 — End: 2012-02-19
  Administered 2012-02-19: 4 mg via INTRAVENOUS
  Filled 2012-02-19: qty 2

## 2012-02-19 MED ORDER — LORAZEPAM 2 MG/ML IJ SOLN
1.0000 mg | Freq: Once | INTRAMUSCULAR | Status: AC
  Administered 2012-02-19: 1 mg via INTRAVENOUS
  Filled 2012-02-19: qty 1

## 2012-02-19 MED ORDER — ONDANSETRON 4 MG PO TBDP
ORAL_TABLET | ORAL | Status: AC
  Filled 2012-02-19: qty 2

## 2012-02-19 MED ORDER — HYDROMORPHONE HCL PF 1 MG/ML IJ SOLN
1.0000 mg | Freq: Once | INTRAMUSCULAR | Status: AC
  Administered 2012-02-19: 1 mg via INTRAVENOUS
  Filled 2012-02-19: qty 1

## 2012-02-19 MED ORDER — ONDANSETRON 4 MG PO TBDP
8.0000 mg | ORAL_TABLET | Freq: Once | ORAL | Status: AC
  Administered 2012-02-19: 8 mg via ORAL

## 2012-02-19 MED ORDER — SODIUM CHLORIDE 0.9 % IV BOLUS (SEPSIS)
1000.0000 mL | Freq: Once | INTRAVENOUS | Status: AC
  Administered 2012-02-19: 1000 mL via INTRAVENOUS

## 2012-02-19 MED ORDER — ONDANSETRON 4 MG PO TBDP
8.0000 mg | ORAL_TABLET | Freq: Once | ORAL | Status: AC
  Administered 2012-02-19: 8 mg via ORAL
  Filled 2012-02-19: qty 2

## 2012-02-19 NOTE — ED Provider Notes (Signed)
History     CSN: [id number]  Arrival date & time 02/19/12  1713   First MD Initiated Contact with Patient 02/19/12 2057      Chief Complaint  Patient presents with  . Abdominal Pain    (Consider location/radiation/quality/duration/timing/severity/associated sxs/prior treatment) HPI Comments: Brandon [name] 46 y.o. male   The chief complaint is: Patient presents with:   Abdominal Pain  Past Medical History:   HIV                                                          Stroke                                                       Pancreatitis                                                 Mechanical heart valve present (on Coumadin)                                                                      46 year old male with a significant past medical history stated above presents to the emergency department with chief complaint of abdominal pain nausea and vomiting.  Patient is status post laparoscopic cholecystectomy on 02/12/2012.  Patient states that he isn't symptoms resolved and went home without nausea or vomiting.  On Sunday he awoke feeling nauseous his nausea and vomiting became more severe.  He has had decreased appetite but denies any fevers or chills.  Patient has frequent bouts of nausea and vomiting.  He is currently on HAART therapy.  Patient last CD4 count was 28 and has a viral load greater than 70,000.  He is followed by infectious disease at [facility].  He was treated last month for septic joint of the right knee.  He is currently on dapsone IV and Cipro.       The history is provided by the patient, the spouse and medical records. No language interpreter was used.    Past Medical History  Diagnosis Date  . HIV (human immunodeficiency virus infection)   . Hypertension   . Stroke   . Pancreatitis   . Mechanical heart valve present   . Arthritis     Past Surgical History  Procedure Date  . Cardiac surgery   . Knee surgery   .  Cholecystectomy 02/12/2012    Procedure: LAPAROSCOPIC CHOLECYSTECTOMY;  Surgeon: Almond Lint, MD;  Location: [facility] OR;  Service: General;  Laterality: N/A;    Family History  Problem Relation Age of Onset  . Hypertension Father   . Hypertension Sister   . Diabetes Maternal Aunt   . Cancer - Prostate Father   . Cancer - Other Cousin   . Parkinson's  disease Paternal Aunt     History  Substance Use Topics  . Smoking status: Current Every Day Smoker -- 0.5 packs/day for 20 years  . Smokeless tobacco: Not on file  . Alcohol Use: Yes     Comment: 12oz beer/day-beer last pm      Review of Systems  HENT: Negative.   Eyes: Negative.   Respiratory: Negative.   Gastrointestinal: Positive for nausea, vomiting and abdominal pain.  Genitourinary: Negative.   Musculoskeletal: Negative.   Skin: Negative.   Neurological: Positive for weakness.  Psychiatric/Behavioral: Negative.   All other systems reviewed and are negative.    Allergies  Bee venom; Sulfa antibiotics; Truvada; and Lidoderm  Home Medications   Current Outpatient Rx  Name  Route  Sig  Dispense  Refill  . AZITHROMYCIN 600 MG PO TABS   Oral   Take 1,200 mg by mouth every 7 (seven) days.          Marland Kitchen CALCIUM-VITAMIN D-VITAMIN K 500-500-40 MG-UNT-MCG PO CHEW   Oral   Chew 2 tablets by mouth Daily. Take 2 tablets by mouth daily.         Marland Kitchen CIPROFLOXACIN HCL 500 MG PO TABS   Oral   Take 500 mg by mouth Every 12 hours. Take 1 tablet (500 mg total) by mouth every 12 hours for 15 days. Last dose on 02/18/12.         Marland Kitchen DAPSONE 100 MG PO TABS   Oral   Take 100 mg by mouth daily.         Marland Kitchen DAPTOMYCIN 500 MG IV SOLR   Intravenous   Inject 500 mg into the vein Daily. Infuse 500 mg into the vein daily for 15 doses. Last dose on 02/18/12.         Marland Kitchen DARUNAVIR ETHANOLATE 600 MG PO TABS   Oral   Take 600 mg by mouth 2 (two) times daily with a meal.         . DRONABINOL 2.5 MG PO CAPS   Oral   Take 1 capsule by  mouth BID times 48H. TAKE ONE CAPSULE BY MOUTH TWICE A DAY         . ENOXAPARIN SODIUM 80 MG/0.8ML [place] SOLN   Subcutaneous   Inject 0.8 mLs (80 mg total) into the skin daily.   5 Syringe   0   . EPIPEN IJ   Injection   Inject 1 application as directed once as needed. For sever allergic reaction         . FOLIC ACID 1 MG PO TABS   Oral   Take 1 mg by mouth daily.         Marland Kitchen HYDROCHLOROTHIAZIDE 25 MG PO TABS   Oral   Take 1 tablet (25 mg total) by mouth daily.   30 tablet   1   . ISOSORB DINITRATE-HYDRALAZINE 20-37.5 MG PO TABS   Oral   Take 1 tablet by mouth 2 (two) times daily.   60 tablet   1   . MIRTAZAPINE 30 MG PO TABS   Oral   Take 30 mg by mouth at bedtime.         . NYSTATIN 100000 UNIT/ML MT SUSP   Oral   Take 5 mLs by mouth 4 times daily. Take 5 mLs (500,000 Units total) by mouth 4 times daily for 10 days. (swish and swallow each dose)         . ONDANSETRON HCL 4 MG PO TABS  Oral   Take 4 mg by mouth every 8 (eight) hours as needed. For nausea         . OXYCODONE HCL 15 MG PO TABS   Oral   Take 1 tablet (15 mg total) by mouth every 8 (eight) hours as needed for pain. Take 1 tablet (15 mg total) by mouth every 8 (eight) hours as needed for 30 doses.   30 tablet   0   . PANTOPRAZOLE SODIUM 20 MG PO TBEC   Oral   Take 40 mg by mouth daily.         Marland Kitchen PROCHLORPERAZINE MALEATE 10 MG PO TABS   Oral   Take 10 mg by mouth every 6 (six) hours as needed. For nausea         . RITONAVIR 100 MG PO CAPS   Oral   Take 100 mg by mouth 2 (two) times daily.         . TENOFOVIR DISOPROXIL FUMARATE 300 MG PO TABS   Oral   Take 300 mg by mouth daily.         Marland Kitchen VALACYCLOVIR HCL 1 G PO TABS   Oral   Take 1,000 mg by mouth daily.         Marland Kitchen VITAMIN B-12 1000 MCG PO TABS   Oral   Take 1,000 mcg by mouth daily.         . WARFARIN SODIUM 6 MG PO TABS   Oral   Take 1 tablet (6 mg total) by mouth daily.   10 tablet   0   . ZIDOVUDINE 300 MG  PO TABS   Oral   Take 300 mg by mouth 2 (two) times daily.           BP 169/88  Pulse 104  Temp 99.2 F (37.3 C) (Oral)  Resp 18  SpO2 100%  Physical Exam  Nursing note and vitals reviewed. Constitutional: He is oriented to person, place, and time.       Ill, distressed cachectic male actively retching   HENT:  Head: Normocephalic and atraumatic.  Eyes: Conjunctivae normal and EOM are normal. Pupils are equal, round, and reactive to light.  Neck: Normal range of motion. Neck supple. No JVD present. No tracheal deviation present.  Cardiovascular: Normal rate, normal heart sounds and intact distal pulses.        Tachycardic.  Pulmonary/Chest: Effort normal and breath sounds normal. No respiratory distress. He has no wheezes. He exhibits no tenderness.  Abdominal: Soft. Bowel sounds are normal. He exhibits no distension. There is tenderness.  Musculoskeletal: Normal range of motion.  Neurological: He is alert and oriented to person, place, and time.  Skin: He is diaphoretic.       Cool and sweaty  Psychiatric: He has a normal mood and affect. His behavior is normal.    ED Course  Procedures (including critical care time)  Labs Reviewed  LIPASE, BLOOD - Abnormal; Notable for the following:    Lipase 83 (*)     All other components within normal limits  CBC WITH DIFFERENTIAL - Abnormal; Notable for the following:    RBC 2.94 (*)     Hemoglobin 9.7 (*)     HCT 28.5 (*)     RDW 18.9 (*)     All other components within normal limits  COMPREHENSIVE METABOLIC PANEL - Abnormal; Notable for the following:    Potassium 3.2 (*)     BUN 25 (*)  Creatinine, Ser 1.44 (*)     Total Protein 8.7 (*)     GFR calc non Af Amer 58 (*)     GFR calc Af Amer 67 (*)     All other components within normal limits  PROTIME-INR - Abnormal; Notable for the following:    Prothrombin Time 22.3 (*)     INR 2.05 (*)     All other components within normal limits  APTT - Abnormal; Notable for  the following:    aPTT 39 (*)     All other components within normal limits  URINALYSIS, ROUTINE W REFLEX MICROSCOPIC   No results found.   1. Nausea & vomiting   2. Abdominal pain   3. HIV disease   4. S/P AVR (aortic valve replacement)   5. Septic arthritis of knee       MDM  Paitent with uncontrolled n/v /abd pain, s/p  Lap chole x 1 week.  Slightly elvated lipase, dehydration.  Heis being treated for septic arthritis of knee which shows no signs of infection at this time.  Patient talso has CD4 count in the 20s and high viral load.  I believe inpaitent obs for possible viral gastroenteritis and syumptom control is waranted.  I have spoken with Dr. Lars Masson who will admit.  Dr. Lindie Spruce has been notified and surgery will consult .          Arthor Captain, PA-C 02/20/12 1220

## 2012-02-19 NOTE — ED Notes (Signed)
Per Zack with IV Team, Ok to access Left arm PICC

## 2012-02-19 NOTE — ED Notes (Signed)
Escorted patient back to waiting room. Provided ice chips and warm blanket.

## 2012-02-19 NOTE — ED Notes (Signed)
Patient states that he is still unable to void. EDP notified

## 2012-02-19 NOTE — ED Notes (Signed)
Patient aware that we need a urine specimen. Patient unable to void at this time. Provided patient with specimen cup

## 2012-02-19 NOTE — ED Notes (Signed)
Per pt recent gallbladder surgery and since Monday he has had N,V. Able to hold some food down and centralized abdominal pain.

## 2012-02-19 NOTE — ED Notes (Signed)
Patient and spouse is very upset about wait time. Will take patient to triage area to re-assess v/s and administer Zofran per protocol orders

## 2012-02-20 ENCOUNTER — Encounter (HOSPITAL_COMMUNITY): Payer: Self-pay | Admitting: Internal Medicine

## 2012-02-20 DIAGNOSIS — K861 Other chronic pancreatitis: Principal | ICD-10-CM | POA: Diagnosis present

## 2012-02-20 DIAGNOSIS — R109 Unspecified abdominal pain: Secondary | ICD-10-CM

## 2012-02-20 DIAGNOSIS — B20 Human immunodeficiency virus [HIV] disease: Secondary | ICD-10-CM

## 2012-02-20 DIAGNOSIS — Z954 Presence of other heart-valve replacement: Secondary | ICD-10-CM

## 2012-02-20 DIAGNOSIS — R112 Nausea with vomiting, unspecified: Secondary | ICD-10-CM

## 2012-02-20 DIAGNOSIS — E876 Hypokalemia: Secondary | ICD-10-CM | POA: Diagnosis present

## 2012-02-20 DIAGNOSIS — M009 Pyogenic arthritis, unspecified: Secondary | ICD-10-CM

## 2012-02-20 LAB — COMPREHENSIVE METABOLIC PANEL
ALT: 11 U/L (ref 0–53)
CO2: 25 mEq/L (ref 19–32)
Calcium: 7.6 mg/dL — ABNORMAL LOW (ref 8.4–10.5)
Chloride: 108 mEq/L (ref 96–112)
GFR calc Af Amer: >90 mL/min (ref >90)
GFR calc non Af Amer: 82 mL/min — ABNORMAL LOW (ref >90)
Glucose, Bld: 70 mg/dL (ref 70–99)
Sodium: 140 mEq/L (ref 135–145)
Total Bilirubin: 0.2 mg/dL — ABNORMAL LOW (ref 0.3–1.2)

## 2012-02-20 LAB — LIPASE, BLOOD: Lipase: 44 U/L (ref 11–59)

## 2012-02-20 LAB — CBC WITH DIFFERENTIAL/PLATELET
Basophils Absolute: 0 10*3/uL (ref 0.0–0.1)
Basophils Relative: 1 % (ref 0–1)
Eosinophils Absolute: 0 10*3/uL (ref 0.0–0.7)
Hemoglobin: 8.5 g/dL — ABNORMAL LOW (ref 13.0–17.0)
MCH: 33.2 pg (ref 26.0–34.0)
MCHC: 34.1 g/dL (ref 30.0–36.0)
Monocytes Relative: 12 % (ref 3–12)
Neutro Abs: 3 10*3/uL (ref 1.7–7.7)
Neutrophils Relative %: 56 % (ref 43–77)
Platelets: 174 10*3/uL (ref 150–400)
RDW: 18.5 % — ABNORMAL HIGH (ref 11.5–15.5)

## 2012-02-20 LAB — PROTIME-INR
INR: 2.11 — ABNORMAL HIGH (ref 0.00–1.49)
Prothrombin Time: 22.8 seconds — ABNORMAL HIGH (ref 11.6–15.2)

## 2012-02-20 MED ORDER — DRONABINOL 2.5 MG PO CAPS
2.5000 mg | ORAL_CAPSULE | Freq: Two times a day (BID) | ORAL | Status: DC
  Administered 2012-02-20 – 2012-02-21 (×3): 2.5 mg via ORAL
  Filled 2012-02-20 (×3): qty 1

## 2012-02-20 MED ORDER — HEPARIN (PORCINE) IN NACL 100-0.45 UNIT/ML-% IJ SOLN
1450.0000 [IU]/h | INTRAMUSCULAR | Status: DC
  Administered 2012-02-20: 1200 [IU]/h via INTRAVENOUS
  Filled 2012-02-20 (×3): qty 250

## 2012-02-20 MED ORDER — OXYCODONE HCL 5 MG PO TABS
10.0000 mg | ORAL_TABLET | Freq: Four times a day (QID) | ORAL | Status: DC | PRN
  Administered 2012-02-20: 10 mg via ORAL
  Administered 2012-02-21: 5 mg via ORAL
  Filled 2012-02-20: qty 2
  Filled 2012-02-20: qty 1

## 2012-02-20 MED ORDER — MIRTAZAPINE 30 MG PO TABS
30.0000 mg | ORAL_TABLET | Freq: Every day | ORAL | Status: DC
  Administered 2012-02-20: 30 mg via ORAL
  Filled 2012-02-20 (×3): qty 1

## 2012-02-20 MED ORDER — SODIUM CHLORIDE 0.9 % IJ SOLN
3.0000 mL | Freq: Two times a day (BID) | INTRAMUSCULAR | Status: DC
  Administered 2012-02-20 (×2): 3 mL via INTRAVENOUS

## 2012-02-20 MED ORDER — ZIDOVUDINE 100 MG PO CAPS
300.0000 mg | ORAL_CAPSULE | Freq: Two times a day (BID) | ORAL | Status: DC
  Administered 2012-02-20 – 2012-02-21 (×3): 300 mg via ORAL
  Filled 2012-02-20 (×4): qty 3

## 2012-02-20 MED ORDER — CIPROFLOXACIN HCL 500 MG PO TABS
500.0000 mg | ORAL_TABLET | Freq: Two times a day (BID) | ORAL | Status: DC
  Filled 2012-02-20: qty 1

## 2012-02-20 MED ORDER — BOOST / RESOURCE BREEZE PO LIQD
1.0000 | Freq: Three times a day (TID) | ORAL | Status: DC
  Administered 2012-02-21 (×2): 1 via ORAL

## 2012-02-20 MED ORDER — ACETAMINOPHEN 325 MG PO TABS: 650.0000 mg | ORAL_TABLET | Freq: Four times a day (QID) | ORAL | Status: DC | PRN

## 2012-02-20 MED ORDER — CIPROFLOXACIN IN D5W 400 MG/200ML IV SOLN
400.0000 mg | Freq: Two times a day (BID) | INTRAVENOUS | Status: DC
  Administered 2012-02-20 – 2012-02-21 (×3): 400 mg via INTRAVENOUS
  Filled 2012-02-20 (×6): qty 200

## 2012-02-20 MED ORDER — DAPTOMYCIN 500 MG IV SOLR: 500.0000 mg | Freq: Every day | INTRAVENOUS | Status: DC

## 2012-02-20 MED ORDER — WARFARIN SODIUM 7.5 MG PO TABS
7.5000 mg | ORAL_TABLET | Freq: Once | ORAL | Status: AC
  Administered 2012-02-20: 7.5 mg via ORAL
  Filled 2012-02-20: qty 1

## 2012-02-20 MED ORDER — ONDANSETRON HCL 4 MG/2ML IJ SOLN
4.0000 mg | Freq: Four times a day (QID) | INTRAMUSCULAR | Status: DC | PRN
  Administered 2012-02-21: 4 mg via INTRAVENOUS
  Filled 2012-02-20: qty 2

## 2012-02-20 MED ORDER — HYDROMORPHONE HCL PF 1 MG/ML IJ SOLN
1.0000 mg | INTRAMUSCULAR | Status: AC | PRN
  Administered 2012-02-20 (×3): 1 mg via INTRAVENOUS
  Filled 2012-02-20 (×3): qty 1

## 2012-02-20 MED ORDER — VALACYCLOVIR HCL 500 MG PO TABS
1000.0000 mg | ORAL_TABLET | Freq: Every day | ORAL | Status: DC
  Administered 2012-02-20 – 2012-02-21 (×2): 1000 mg via ORAL
  Filled 2012-02-20 (×2): qty 2

## 2012-02-20 MED ORDER — PANTOPRAZOLE SODIUM 40 MG PO TBEC
40.0000 mg | DELAYED_RELEASE_TABLET | Freq: Every day | ORAL | Status: DC
  Administered 2012-02-20 – 2012-02-21 (×2): 40 mg via ORAL
  Filled 2012-02-20 (×2): qty 1

## 2012-02-20 MED ORDER — SODIUM CHLORIDE 0.9 % IV SOLN
INTRAVENOUS | Status: DC
  Administered 2012-02-20: 01:00:00 via INTRAVENOUS

## 2012-02-20 MED ORDER — SODIUM CHLORIDE 0.9 % IV SOLN
INTRAVENOUS | Status: DC
  Administered 2012-02-20: 06:00:00 via INTRAVENOUS

## 2012-02-20 MED ORDER — ACETAMINOPHEN 650 MG RE SUPP: 650.0000 mg | Freq: Four times a day (QID) | RECTAL | Status: DC | PRN

## 2012-02-20 MED ORDER — DAPSONE 100 MG PO TABS
100.0000 mg | ORAL_TABLET | Freq: Every day | ORAL | Status: DC
  Administered 2012-02-20 – 2012-02-21 (×2): 100 mg via ORAL
  Filled 2012-02-20 (×2): qty 1

## 2012-02-20 MED ORDER — SODIUM CHLORIDE 0.9 % IV SOLN
INTRAVENOUS | Status: DC
  Administered 2012-02-20: 50 mL via INTRAVENOUS

## 2012-02-20 MED ORDER — TENOFOVIR DISOPROXIL FUMARATE 300 MG PO TABS
300.0000 mg | ORAL_TABLET | Freq: Every day | ORAL | Status: DC
  Administered 2012-02-20 – 2012-02-21 (×2): 300 mg via ORAL
  Filled 2012-02-20 (×2): qty 1

## 2012-02-20 MED ORDER — ONDANSETRON HCL 4 MG PO TABS
4.0000 mg | ORAL_TABLET | Freq: Four times a day (QID) | ORAL | Status: DC | PRN
  Administered 2012-02-20: 4 mg via ORAL
  Filled 2012-02-20: qty 1

## 2012-02-20 MED ORDER — HYDROMORPHONE HCL PF 1 MG/ML IJ SOLN
0.5000 mg | INTRAMUSCULAR | Status: DC | PRN
  Administered 2012-02-20 – 2012-02-21 (×5): 0.5 mg via INTRAVENOUS
  Filled 2012-02-20 (×5): qty 1

## 2012-02-20 MED ORDER — DAPTOMYCIN 500 MG IV SOLR
500.0000 mg | Freq: Every day | INTRAVENOUS | Status: DC
  Filled 2012-02-20: qty 10

## 2012-02-20 MED ORDER — SODIUM CHLORIDE 0.9 % IV SOLN
500.0000 mg | INTRAVENOUS | Status: DC
  Administered 2012-02-20 – 2012-02-21 (×2): 500 mg via INTRAVENOUS
  Filled 2012-02-20 (×3): qty 10

## 2012-02-20 MED ORDER — AZITHROMYCIN 600 MG PO TABS: 1200.0000 mg | ORAL_TABLET | ORAL | Status: DC

## 2012-02-20 MED ORDER — DARUNAVIR ETHANOLATE 600 MG PO TABS
600.0000 mg | ORAL_TABLET | Freq: Two times a day (BID) | ORAL | Status: DC
  Administered 2012-02-20 – 2012-02-21 (×3): 600 mg via ORAL
  Filled 2012-02-20 (×5): qty 1

## 2012-02-20 MED ORDER — POTASSIUM CHLORIDE CRYS ER 20 MEQ PO TBCR
40.0000 meq | EXTENDED_RELEASE_TABLET | Freq: Once | ORAL | Status: AC
  Administered 2012-02-20: 40 meq via ORAL
  Filled 2012-02-20: qty 2

## 2012-02-20 MED ORDER — VITAMIN B-12 1000 MCG PO TABS
1000.0000 ug | ORAL_TABLET | Freq: Every day | ORAL | Status: DC
  Administered 2012-02-20 – 2012-02-21 (×2): 1000 ug via ORAL
  Filled 2012-02-20 (×2): qty 1

## 2012-02-20 MED ORDER — FOLIC ACID 1 MG PO TABS
1.0000 mg | ORAL_TABLET | Freq: Every day | ORAL | Status: DC
  Administered 2012-02-20 – 2012-02-21 (×2): 1 mg via ORAL
  Filled 2012-02-20 (×2): qty 1

## 2012-02-20 MED ORDER — WARFARIN - PHARMACIST DOSING INPATIENT: Freq: Every day | Status: DC

## 2012-02-20 MED ORDER — ONDANSETRON HCL 4 MG/2ML IJ SOLN
4.0000 mg | Freq: Three times a day (TID) | INTRAMUSCULAR | Status: DC | PRN
  Administered 2012-02-20: 4 mg via INTRAVENOUS
  Filled 2012-02-20: qty 2

## 2012-02-20 MED ORDER — RITONAVIR 100 MG PO CAPS
100.0000 mg | ORAL_CAPSULE | Freq: Two times a day (BID) | ORAL | Status: DC
  Administered 2012-02-20 – 2012-02-21 (×3): 100 mg via ORAL
  Filled 2012-02-20 (×4): qty 1

## 2012-02-20 MED ORDER — HYDRALAZINE HCL 20 MG/ML IJ SOLN: 10.0000 mg | INTRAMUSCULAR | Status: DC | PRN

## 2012-02-20 NOTE — Progress Notes (Signed)
ANTICOAGULATION CONSULT NOTE - Follow Up Consult  Pharmacy Consult for Heparin and Coumadin Indication: AVR  Heparin dosing wt: 53.6 kg  Labs:  Basename 02/20/12 1455 02/20/12 0541 02/19/12 2257 02/19/12 1722  HGB -- 8.5* -- 9.7*  HCT -- 24.9* -- 28.5*  PLT -- 174 -- 226  APTT -- -- 39* --  LABPROT -- 22.8* 22.3* --  INR -- 2.11* 2.05* --  HEPARINUNFRC 0.13* -- -- --  CREATININE -- 1.08 -- 1.44*  CKTOTAL -- -- -- --  CKMB -- -- -- --  TROPONINI -- -- -- --    Assessment: 46yo male on Coumadin and Lovenox bridge PTA after recent lap chole for mechanical AVR.  Now admitted with worsening abdominal pain.  Heparin started overnight for bridging, coumadin also resumed.  Heparin level this afternoon noted to be less than goal at 0.13 with heparin at 1200 units/hr.  No complications noted.  Home Doses:  - Coumadin 6 mg daily, last dose 02/18/12.  INR on admit 12/11 was 2.05 - Lovenox 80 mg sq q24h, last dose 12/10 (pt reports missing dose 12/11)  Drug/Drug Interactions: Patient noted to be on Cipro both PTA and changed to IV as inpt.  As it is known to increase the sensitivity to Coumadin - will dose conservatively and monitor closely while cipro is on board.  Also noted to be resumed on home dose of ritonavir which can also potentiate the INR.   Goal of Therapy:  Heparin level 0.3-0.7 units/ml INR 2.5-3.5 Monitor Platelets per anticoagulation protocol: YES   Plan:  - Increase heparin to 1450 units/hr - NO bolus 2/2 INR - Follow up 6h Heparin level  - Check daily heparin level, CBC, INR  Loray Akard L. Illene Bolus, PharmD, BCPS Clinical Pharmacist Pager: [phone] Pharmacy: [phone] 02/20/2012 3:32 PM

## 2012-02-20 NOTE — Progress Notes (Signed)
ANTICOAGULATION and ANTIBIOTIC CONSULT NOTE - Initial Consult  Pharmacy Consult for heparin and Cipro Indication: AVR and septic arthritis  Allergies  Allergen Reactions  . Bee Venom Anaphylaxis  . Sulfa Antibiotics Anaphylaxis  . Truvada (Emtricitabine-Tenofovir) Anaphylaxis    Takes plain tenofovir at home  . Lidoderm (Lidocaine) Other (See Comments)    Reaction unknown    Vital Signs: Temp: 99.1 F (37.3 C) (12/11 2345) Temp src: Rectal (12/11 2345) BP: 115/77 mmHg (12/12 0100) Pulse Rate: 111  (12/12 0100)  Labs:  Basename 02/19/12 2257 02/19/12 1722  HGB -- 9.7*  HCT -- 28.5*  PLT -- 226  APTT 39* --  LABPROT 22.3* --  INR 2.05* --  HEPARINUNFRC -- --  CREATININE -- 1.44*  CKTOTAL -- --  CKMB -- --  TROPONINI -- --    Medical History: Past Medical History  Diagnosis Date  . HIV (human immunodeficiency virus infection)   . Hypertension   . Stroke   . Pancreatitis   . Mechanical heart valve present   . Arthritis     Assessment: 46yo male c/o N/V and worsening abdominal pain after recent discharge for lap chole, to begin heparin for mechanical AVR while Coumadin on hold for possible surgical intervention, though initial INR >2; missed dose 12/11 so will confirm INR is trending down prior to dosing heparin.  Pt takes Cipro at home though to change to IV d/t N/V; 15 days of Cubicin therapy has been completed as outpatient.  Goal of Therapy:  Heparin level 0.3-0.7 units/ml Monitor platelets by anticoagulation protocol: Yes   Plan:  Will monitor INR prior to starting heparin.  Will start Cipro 400mg  IV Q12H and monitor for ability to take PO.  Colleen Can PharmD BCPS 02/20/2012,3:12 AM

## 2012-02-20 NOTE — Progress Notes (Signed)
[facility] PROGRESS NOTE  Brandon [name] WUJ:[id number] DOB: 11-05-1965 DOA: 02/19/2012 PCP: [name], MD  HPI 46 year old male patient with PMH of HIV on treatment, HTN, chronic pancreatitis, mechanical aortic valve on anticoagulation, septic arthritis of right knee, anemia of chronic disease recently discharged (12/6) after having a laparoscopic cholecystectomy, admitted on 12/12 with worsening abdominal pain, nausea and vomiting which started 2-3 days after discharge. Patient denies coffee grounds or blood in vomitus. Last BM on 12/11 was said to be normal. Surgeons were consulted by EDP. Lipase was minimally elevated.   Assessment/Plan:  Abdominal pain, nausea and vomiting  Possibly from underlying chronic pancreatitis-? Mild flare.  Surgical consultation appreciated. They do not see any evidence of postsurgical complications.  Patient denies alcohol use.  Start clear liquids and advance diet as tolerated.  Patient sees GI at [facility] consider consultation if he does not improve.  Hypokalemia  Replete and follow BMP in a.m.  Anemia of chronic disease  No bleeding.  Probably at baseline.  Follow up CBC in a.m.  Aortic valve replacement  Continue IV heparin and Coumadin per pharmacy.  HIV  Continue home Antimicrobial regimen.  Followed at the [facility]  Septic arthritis right knee  Continue home IV daptomycin and changed to IV Cipro until able to tolerate by mouth  Hypertension  Controlled.  Recent acute renal failure versus chronic kidney disease  Creatinine has normalized to 1.08. Patient had creatinine of 0.99 on 01/16/2012.   Code Status: Full Family Communication: Discussed with patient Disposition Plan: Home when stable.   Consultants:  General surgery  Procedures:  None  Antibiotics:  IV Cipro 12/12  Dapsone by mouth 12/12  Darunavir, Norvir, Viread, Valtrex and Retrovir by mouth 12/12  IV Daptomycin  12/12  HPI/Subjective: Still has intermittent cramping upper abdominal pain. No further nausea or vomiting. Normal BM on 12/11.  Objective: Filed Vitals:   02/19/12 2345 02/20/12 0030 02/20/12 0100 02/20/12 0300  BP: 180/110 115/78 115/77 136/82  Pulse: 109 116 111 103  Temp: 99.1 F (37.3 C)   98 F (36.7 C)  TempSrc: Rectal   Oral  Resp: 16   15  Height:    5\' 9"  (1.753 m)  Weight:    53.57 kg (118 lb 1.6 oz)  SpO2: 100% 100% 100% 99%    Intake/Output Summary (Last 24 hours) at 02/20/12 1215 Last data filed at 02/20/12 1000  Gross per 24 hour  Intake      0 ml  Output    100 ml  Net   -100 ml   Filed Weights   02/20/12 0300  Weight: 53.57 kg (118 lb 1.6 oz)    Exam:   General exam: Appears comfortable.  Respiratory system: Clear to auscultation. No increased work of breathing.  Cardiovascular system: First and second heart sounds heard, regular rate and rhythm. No JVD, murmurs or gallops. Telemetry shows normal sinus rhythm.  Gastrointestinal system: Abdomen is nondistended and soft. Mild tenderness in the epigastrium and right upper quadrant but without rigidity, rebound or guarding. No organomegaly or masses appreciated. Normal bowel sounds heard.  Central nervous system: Alert and oriented. No focal deficits.  Extremities: Symmetric 5 x 5 power. Sutures on right knee I&D site are intact without any acute findings of the right knee. Patient has a PICC line on the left upper arm.   Data Reviewed: Basic Metabolic Panel:  Lab 02/20/12 9562 02/19/12 1722 02/14/12 0546  NA 140 139 138  K 3.4* 3.2* 4.2  CL 108 97 105  CO2 25 31 26   GLUCOSE 70 96 106*  BUN 17 25* 6  CREATININE 1.08 1.44* 1.40*  CALCIUM 7.6* 9.4 9.0  MG -- -- --  PHOS -- -- --   Liver Function Tests:  Lab 02/20/12 0541 02/19/12 1722 02/14/12 0546  AST 17 26 29   ALT 11 16 20   ALKPHOS 67 94 80  BILITOT 0.2* 0.3 0.4  PROT 6.0 8.7* 6.9  ALBUMIN 2.5* 3.7 2.7*    Lab 02/20/12 0541  02/19/12 1722  LIPASE 44 83*  AMYLASE -- --   No results found for this basename: AMMONIA:5 in the last 168 hours CBC:  Lab 02/20/12 0541 02/19/12 1722 02/14/12 0546  WBC 5.4 8.1 5.3  NEUTROABS 3.0 4.2 --  HGB 8.5* 9.7* 8.6*  HCT 24.9* 28.5* 25.6*  MCV 97.3 96.9 95.9  PLT 174 226 189   Cardiac Enzymes: No results found for this basename: CKTOTAL:5,CKMB:5,CKMBINDEX:5,TROPONINI:5 in the last 168 hours BNP (last 3 results) No results found for this basename: PROBNP:3 in the last 8760 hours CBG: No results found for this basename: GLUCAP:5 in the last 168 hours  Recent Results (from the past 240 hour(s))  SURGICAL PCR SCREEN     Status: Normal   Collection Time   02/11/12  4:53 PM      Component Value Range Status Comment   MRSA, PCR NEGATIVE  NEGATIVE Final    Staphylococcus aureus NEGATIVE  NEGATIVE Final      Studies: No results found.  Scheduled Meds:    . azithromycin  1,200 mg Oral Q Tue  . ciprofloxacin  400 mg Intravenous Q12H  . dapsone  100 mg Oral Daily  . darunavir  600 mg Oral BID WC  . dronabinol  2.5 mg Oral BID  . folic acid  1 mg Oral Daily  . [COMPLETED]  HYDROmorphone (DILAUDID) injection  1 mg Intravenous Once  . [COMPLETED]  HYDROmorphone (DILAUDID) injection  1 mg Intravenous Once  . [COMPLETED] LORazepam  1 mg Intravenous Once  . [COMPLETED] LORazepam  1 mg Intravenous Once  . [COMPLETED] ondansetron (ZOFRAN) IV  4 mg Intravenous Once  . [COMPLETED] ondansetron      . [COMPLETED] ondansetron  8 mg Oral Once  . [COMPLETED] ondansetron  8 mg Oral Once  . ritonavir  100 mg Oral BID  . [COMPLETED] sodium chloride  1,000 mL Intravenous Once  . sodium chloride  3 mL Intravenous Q12H  . tenofovir  300 mg Oral Daily  . valACYclovir  1,000 mg Oral Daily  . vitamin B-12  1,000 mcg Oral Daily  . warfarin  7.5 mg Oral ONCE-1800  . Warfarin - Pharmacist Dosing Inpatient   Does not apply q1800  . zidovudine  300 mg Oral BID  . [DISCONTINUED] sodium  chloride   Intravenous STAT  . [DISCONTINUED] ciprofloxacin  500 mg Oral BID  . [DISCONTINUED] DAPTOmycin  500 mg Intravenous Daily   Continuous Infusions:    . sodium chloride 125 mL/hr at 02/20/12 1610  . heparin 1,200 Units/hr (02/20/12 0743)    Principal Problem:  *Abdominal pain Active Problems:  S/P AVR (aortic valve replacement)  Septic arthritis of knee  HIV disease    Time spent: 25 minutes   [facility]  [facility] Pager [phone]. If 8PM-8AM, please contact night-coverage at [email or web address], password [facility] 02/20/2012, 12:15 PM  LOS: 1 day

## 2012-02-20 NOTE — Progress Notes (Signed)
ANTICOAGULATION CONSULT NOTE - Follow Up Consult  Pharmacy Consult for heparin Indication: AVR  Labs:  Basename 02/20/12 0541 02/19/12 2257 02/19/12 1722  HGB 8.5* -- 9.7*  HCT 24.9* -- 28.5*  PLT 174 -- 226  APTT -- 39* --  LABPROT 22.8* 22.3* --  INR 2.11* 2.05* --  HEPARINUNFRC -- -- --  CREATININE 1.08 -- 1.44*  CKTOTAL -- -- --  CKMB -- -- --  TROPONINI -- -- --    Assessment: 46yo male now has slightly increased INR of 2.11, but given home INR goal of 2.5-3.5 per pt, will begin heparin now; pt had been on Lovenox as outpatient, missed dose yesterday.  Goal of Therapy:  Heparin level 0.3-0.7 units/ml   Plan:  On admission earlier this month pt required high heparin gtt rate of 1500 units/hr; given variability of heparin bags will start at lower rate since pt has elevated INR and monitor heparin levels and CBC.  Colleen Can PharmD BCPS 02/20/2012,7:22 AM

## 2012-02-20 NOTE — Progress Notes (Signed)
ANTICOAGULATION CONSULT NOTE - Follow Up Consult  Pharmacy Consult for Heparin and Coumadin Indication: AVR  Heparin dosing wt: 53.6 kg  Labs:  Basename 02/20/12 2200 02/20/12 1455 02/20/12 0541 02/19/12 2257 02/19/12 1722  HGB -- -- 8.5* -- 9.7*  HCT -- -- 24.9* -- 28.5*  PLT -- -- 174 -- 226  APTT -- -- -- 39* --  LABPROT -- -- 22.8* 22.3* --  INR -- -- 2.11* 2.05* --  HEPARINUNFRC 0.34 0.13* -- -- --  CREATININE -- -- 1.08 -- 1.44*  CKTOTAL -- -- -- -- --  CKMB -- -- -- -- --  TROPONINI -- -- -- -- --    Assessment: 46yo male with mechanical AVR admitted with worsening abdominal pain. Patient now on Coumadin and heparin bridge.  Heparin level (0.34) is at-goal on 1450 units/hr.   Goal of Therapy:  Heparin level 0.3-0.7 units/ml INR 2.5-3.5 Monitor Platelets per anticoagulation protocol: YES   Plan:  1. Continue IV heparin at 1450 units/hr.  2. Daily CBC, heparin level.   Lorre Munroe, PharmD 02/20/2012 11:46 PM

## 2012-02-20 NOTE — ED Notes (Signed)
Pt given a cup of ice and a cup of ginger ale. Pt states he feels better and would like something to snack on. Family still by the bedside

## 2012-02-20 NOTE — Progress Notes (Signed)
No surgical issues at this time.

## 2012-02-20 NOTE — Progress Notes (Signed)
INITIAL NUTRITION ASSESSMENT  DOCUMENTATION CODES Per approved criteria  -Non-severe (moderate) malnutrition in the context of acute illness or injury -Underweight   INTERVENTION:  Resource Breeze 3 times daily between meals (250 kcals, 9 gm protein per 8 fl oz carton) RD to follow for nutrition care plan  NUTRITION DIAGNOSIS: Unintended weight loss related to pancreatitis, s/p cholecystectomy as evidenced by 5% weight loss in < 1 month  Goal: Oral intake with meals & supplements to meet >/= 90% of estimated nutrition needs  Monitor:  PO & supplemental intake, weight, labs, I/O's  Reason for Assessment: Health Hx  46 y.o. male  Admitting Dx: Abdominal pain  ASSESSMENT: Patient recently discharged after having a laparoscopic cholecystectomy; presents to ER because of worsening abdominal pain with nausea and vomiting over the last 3 days; labs showed mildly elevated lipase and patient has been admitted for further management.  Patient reports PTA he would usually "graze" throughout the day; reports he suspects some weight loss as he was just hospitalized last week (acute pancreatitis); per records, he's lost approximately 7 lb x 10 days (5%); amenable to Resource Breeze supplements between meals -- RD to order.  Patient meets criteria for non-severe (moderate) malnutrition in the context of acute illness given < 75% of estimated energy requirement for > 7 days and 5% weight loss in < 1 month.  Height: Ht Readings from Last 1 Encounters:  02/20/12 5\' 9"  (1.753 m)    Weight: Wt Readings from Last 1 Encounters:  02/20/12 118 lb 1.6 oz (53.57 kg)    Ideal Body Weight: 73 kg  % Ideal Body Weight: 73%  Wt Readings from Last 10 Encounters:  02/20/12 118 lb 1.6 oz (53.57 kg)  02/09/12 120 lb 5.9 oz (54.6 kg)  02/09/12 120 lb 5.9 oz (54.6 kg)    Usual Body Weight: 125 lb  % Usual Body Weight: 94%  BMI:  Body mass index is 17.44 kg/(m^2).  Estimated Nutritional  Needs: Kcal: 1600-1800 Protein: 80-90 gm Fluid: 1.6-1.8 L  Skin: Intact  Diet Order: Fat Modified   EDUCATION NEEDS: -Education needs addressed -- provided Pancreatitis Nutrition Therapy handouts    Intake/Output Summary (Last 24 hours) at 02/20/12 1248 Last data filed at 02/20/12 1000  Gross per 24 hour  Intake      0 ml  Output    100 ml  Net   -100 ml    Last BM: 12/11  Labs:   Lab 02/20/12 0541 02/19/12 1722 02/14/12 0546  NA 140 139 138  K 3.4* 3.2* 4.2  CL 108 97 105  CO2 25 31 26   BUN 17 25* 6  CREATININE 1.08 1.44* 1.40*  CALCIUM 7.6* 9.4 9.0  MG -- -- --  PHOS -- -- --  GLUCOSE 70 96 106*    Lipase  Date Value Range Status  02/20/2012 46  11 - 59 U/L Final    Scheduled Meds:   . azithromycin  1,200 mg Oral Q Tue  . ciprofloxacin  400 mg Intravenous Q12H  . dapsone  100 mg Oral Daily  . DAPTOmycin  500 mg Intravenous Daily  . darunavir  600 mg Oral BID WC  . dronabinol  2.5 mg Oral BID  . folic acid  1 mg Oral Daily  . [COMPLETED]  HYDROmorphone (DILAUDID) injection  1 mg Intravenous Once  . [COMPLETED]  HYDROmorphone (DILAUDID) injection  1 mg Intravenous Once  . [COMPLETED] LORazepam  1 mg Intravenous Once  . [COMPLETED] LORazepam  1  mg Intravenous Once  . mirtazapine  30 mg Oral QHS  . [COMPLETED] ondansetron (ZOFRAN) IV  4 mg Intravenous Once  . [COMPLETED] ondansetron      . [COMPLETED] ondansetron  8 mg Oral Once  . [COMPLETED] ondansetron  8 mg Oral Once  . potassium chloride  40 mEq Oral Once  . ritonavir  100 mg Oral BID  . [COMPLETED] sodium chloride  1,000 mL Intravenous Once  . sodium chloride  3 mL Intravenous Q12H  . tenofovir  300 mg Oral Daily  . valACYclovir  1,000 mg Oral Daily  . vitamin B-12  1,000 mcg Oral Daily  . warfarin  7.5 mg Oral ONCE-1800  . Warfarin - Pharmacist Dosing Inpatient   Does not apply q1800  . zidovudine  300 mg Oral BID  . [DISCONTINUED] sodium chloride   Intravenous STAT  . [DISCONTINUED]  ciprofloxacin  500 mg Oral BID  . [DISCONTINUED] DAPTOmycin  500 mg Intravenous Daily   Continuous Infusions:   . sodium chloride    . heparin 1,200 Units/hr (02/20/12 0743)  . [DISCONTINUED] sodium chloride 125 mL/hr at 02/20/12 1478    Past Medical History  Diagnosis Date  . HIV (human immunodeficiency virus infection)   . Hypertension   . Stroke   . Pancreatitis   . Mechanical heart valve present   . Arthritis     Past Surgical History  Procedure Date  . Cardiac surgery   . Knee surgery   . Cholecystectomy 02/12/2012    Procedure: LAPAROSCOPIC CHOLECYSTECTOMY;  Surgeon: Almond Lint, MD;  Location: [facility] OR;  Service: General;  Laterality: N/A;    Kirkland Hun, RD, LDN Pager #: [phone] After-Hours Pager #: [phone]

## 2012-02-20 NOTE — ED Provider Notes (Signed)
Medical screening examination/treatment/procedure(s) were performed by non-physician practitioner and as supervising physician I was immediately available for consultation/collaboration.  Raeford Razor, MD 02/20/12 [phone]

## 2012-02-20 NOTE — Progress Notes (Signed)
ANTICOAGULATION CONSULT NOTE - Follow Up Consult  Pharmacy Consult for Heparin and Coumadin Indication: AVR  Heparin dosing wt: 53.6 kg  Labs:  Basename 02/20/12 0541 02/19/12 2257 02/19/12 1722  HGB 8.5* -- 9.7*  HCT 24.9* -- 28.5*  PLT 174 -- 226  APTT -- 39* --  LABPROT 22.8* 22.3* --  INR 2.11* 2.05* --  HEPARINUNFRC -- -- --  CREATININE 1.08 -- 1.44*  CKTOTAL -- -- --  CKMB -- -- --  TROPONINI -- -- --    Assessment: 46yo male on Coumadin and Lovenox bridge PTA after recent lap chole for mechanical AVR.  Now admitted with worsening abdominal pain.  Heparin started overnight for bridging, now asked to resume Coumadin.  Home Doses:  - Coumadin 6 mg daily, last dose 02/18/12.  INR on admit 12/11 was 2.05 - Lovenox 80 mg sq q24h, last dose 12/10 (pt reports missing dose 12/11)  Drug/Drug Interactions: Patient noted to be on Cipro both PTA and changed to IV as inpt.  As it is known to increase the sensitivity to Coumadin - will dose conservatively and monitor closely while cipro is on board.  Also noted to be resumed on home dose of ritonavir which can also potentiate the INR.   Goal of Therapy:  Heparin level 0.3-0.7 units/ml INR 2.5-3.5 Monitor Platelets per anticoagulation protocol: YES   Plan:  - Coumadin 7.5 mg po x 1  - Continue heparin at 1200 units/hr - Follow up 1400 Heparin level  - Check daily heparin level, CBC, INR  Arney Mayabb L. Illene Bolus, PharmD, BCPS Clinical Pharmacist Pager: [phone] Pharmacy: [phone] 02/20/2012 8:29 AM

## 2012-02-20 NOTE — H&P (Signed)
Brandon [name] is an 46 y.o. male.   Patient was seen and examined on February 20, 2012. PCP - Dr. Emeline Darling. Chief Complaint: Abdominal pain with nausea and vomiting. HPI: 46 year old male who was just recently discharged after having a laparoscopic cholecystectomy after patient had gallstone pancreatitis presents to the ER because of worsening abdominal pain with nausea and vomiting over the last 3 days. Patient's pain is mostly in the epigastric area and has had multiple episodes of nausea and vomiting denies any blood in the vomitus. Denies any diarrhea and has had a bowel movement last 24 hours ago. Patient also is having subjective feeling of chills. In the ER patient's labs show mildly elevated lipase and patient has been admitted for further management.  Past Medical History  Diagnosis Date  . HIV (human immunodeficiency virus infection)   . Hypertension   . Stroke   . Pancreatitis   . Mechanical heart valve present   . Arthritis     Past Surgical History  Procedure Date  . Cardiac surgery   . Knee surgery   . Cholecystectomy 02/12/2012    Procedure: LAPAROSCOPIC CHOLECYSTECTOMY;  Surgeon: Almond Lint, MD;  Location: [facility] OR;  Service: General;  Laterality: N/A;    Family History  Problem Relation Age of Onset  . Hypertension Father   . Hypertension Sister   . Diabetes Maternal Aunt   . Cancer - Prostate Father   . Cancer - Other Cousin   . Parkinson's disease Paternal Aunt    Social History:  reports that he has been smoking.  He does not have any smokeless tobacco history on file. He reports that he drinks alcohol. He reports that he uses illicit drugs (Marijuana).  Allergies:  Allergies  Allergen Reactions  . Bee Venom Anaphylaxis  . Sulfa Antibiotics Anaphylaxis  . Truvada (Emtricitabine-Tenofovir) Anaphylaxis  . Lidoderm (Lidocaine) Other (See Comments)    Reaction unknown     (Not in a hospital admission)  Results for orders placed during the hospital  encounter of 02/19/12 (from the past 48 hour(s))  LIPASE, BLOOD     Status: Abnormal   Collection Time   02/19/12  5:22 PM      Component Value Range Comment   Lipase 83 (*) 11 - 59 U/L   CBC WITH DIFFERENTIAL     Status: Abnormal   Collection Time   02/19/12  5:22 PM      Component Value Range Comment   WBC 8.1  4.0 - 10.5 K/uL    RBC 2.94 (*) 4.22 - 5.81 MIL/uL    Hemoglobin 9.7 (*) 13.0 - 17.0 g/dL    HCT 86.5 (*) 78.4 - 52.0 %    MCV 96.9  78.0 - 100.0 fL    MCH 33.0  26.0 - 34.0 pg    MCHC 34.0  30.0 - 36.0 g/dL    RDW 69.6 (*) 29.5 - 15.5 %    Platelets 226  150 - 400 K/uL    Neutrophils Relative 51  43 - 77 %    Neutro Abs 4.2  1.7 - 7.7 K/uL    Lymphocytes Relative 37  12 - 46 %    Lymphs Abs 3.0  0.7 - 4.0 K/uL    Monocytes Relative 9  3 - 12 %    Monocytes Absolute 0.7  0.1 - 1.0 K/uL    Eosinophils Relative 3  0 - 5 %    Eosinophils Absolute 0.2  0.0 - 0.7 K/uL  Basophils Relative 1  0 - 1 %    Basophils Absolute 0.1  0.0 - 0.1 K/uL   COMPREHENSIVE METABOLIC PANEL     Status: Abnormal   Collection Time   02/19/12  5:22 PM      Component Value Range Comment   Sodium 139  135 - 145 mEq/L    Potassium 3.2 (*) 3.5 - 5.1 mEq/L    Chloride 97  96 - 112 mEq/L    CO2 31  19 - 32 mEq/L    Glucose, Bld 96  70 - 99 mg/dL    BUN 25 (*) 6 - 23 mg/dL    Creatinine, Ser 1.61 (*) 0.50 - 1.35 mg/dL    Calcium 9.4  8.4 - 09.6 mg/dL    Total Protein 8.7 (*) 6.0 - 8.3 g/dL    Albumin 3.7  3.5 - 5.2 g/dL    AST 26  0 - 37 U/L    ALT 16  0 - 53 U/L    Alkaline Phosphatase 94  39 - 117 U/L    Total Bilirubin 0.3  0.3 - 1.2 mg/dL    GFR calc non Af Amer 58 (*) >90 mL/min    GFR calc Af Amer 67 (*) >90 mL/min   PROTIME-INR     Status: Abnormal   Collection Time   02/19/12 10:57 PM      Component Value Range Comment   Prothrombin Time 22.3 (*) 11.6 - 15.2 seconds    INR 2.05 (*) 0.00 - 1.49   APTT     Status: Abnormal   Collection Time   02/19/12 10:57 PM      Component  Value Range Comment   aPTT 39 (*) 24 - 37 seconds   URINALYSIS, ROUTINE W REFLEX MICROSCOPIC     Status: Abnormal   Collection Time   02/19/12 11:01 PM      Component Value Range Comment   Color, Urine YELLOW  YELLOW    APPearance CLOUDY (*) CLEAR    Specific Gravity, Urine 1.022  1.005 - 1.030    pH 7.0  5.0 - 8.0    Glucose, UA NEGATIVE  NEGATIVE mg/dL    Hgb urine dipstick NEGATIVE  NEGATIVE    Bilirubin Urine NEGATIVE  NEGATIVE    Ketones, ur NEGATIVE  NEGATIVE mg/dL    Protein, ur 30 (*) NEGATIVE mg/dL    Urobilinogen, UA 0.2  0.0 - 1.0 mg/dL    Nitrite NEGATIVE  NEGATIVE    Leukocytes, UA NEGATIVE  NEGATIVE   URINE MICROSCOPIC-ADD ON     Status: Abnormal   Collection Time   02/19/12 11:01 PM      Component Value Range Comment   Squamous Epithelial / LPF RARE  RARE    WBC, UA 0-2  <3 WBC/hpf    RBC / HPF 0-2  <3 RBC/hpf    Bacteria, UA RARE  RARE    Casts HYALINE CASTS (*) NEGATIVE    No results found.  Review of Systems  Constitutional: Positive for chills.  HENT: Negative.   Eyes: Negative.   Respiratory: Negative.   Cardiovascular: Negative.   Gastrointestinal: Positive for nausea, vomiting and abdominal pain.  Genitourinary: Negative.   Musculoskeletal: Negative.   Skin: Negative.   Neurological: Negative.   Endo/Heme/Allergies: Negative.   Psychiatric/Behavioral: Negative.     Blood pressure 180/110, pulse 109, temperature 99.1 F (37.3 C), temperature source Rectal, resp. rate 16, SpO2 100.00%. Physical Exam  Constitutional: He is oriented to  person, place, and time. He appears well-developed and well-nourished. No distress.  HENT:  Head: Normocephalic and atraumatic.  Right Ear: External ear normal.  Left Ear: External ear normal.  Eyes: Conjunctivae normal are normal. Pupils are equal, round, and reactive to light. Right eye exhibits no discharge. Left eye exhibits no discharge. No scleral icterus.  Neck: Normal range of motion. Neck supple.   Cardiovascular: Regular rhythm.        Tachycardia.  Respiratory: Effort normal and breath sounds normal. No respiratory distress. He has no wheezes. He has no rales.  GI: Soft. Bowel sounds are normal. He exhibits no distension. There is tenderness. There is no rebound and no guarding.  Musculoskeletal: He exhibits no edema.       Right Knee dressing.  Neurological: He is alert and oriented to person, place, and time.       Moves all extremities.  Skin: He is not diaphoretic.     Assessment/Plan #1. Abdominal pain and nausea and vomiting with recent laparoscopic cholecystectomy and mildly elevated lipase - at this time patient will be kept n.p.o. with IV rehydration and pain relief medications. Surgery has been consulted we will follow their recommendations. #2. History of aortic valve replacement on Coumadin - will place patient on IV heparin during the acute episode is case patient has to go for any procedures. Pharmacy to dose the heparin. #3. HIV  - continue and Retrovirals and prophylactic antibiotics.  #4. Septic arthritis  - patient is being treated with IV daptomycin and oral Cipro. I have instructed pharmacy to dose of Cipro IV until patient can take orally. Patient has a PICC line for the same. #5. Chronic kidney disease - creatinine appears at baseline closely follow intake output and metabolic panel. #6. Chronic anemia - follow CBC. Hemoglobin at baseline. #7. History of hypertension - presently we'll keep patient on when necessary IV hydralazine for systolic blood pressure more than 180.  CODE STATUS - full code.   Brandie Lopes N. 02/20/2012, 1:02 AM

## 2012-02-20 NOTE — Progress Notes (Signed)
Patient ID: Brandon [name], male   DOB: 1966-01-14, 46 y.o.   MRN: [id number]    Subjective: Pt known to [place] for recent lap chole secondary to pancreatitis and a finding of gallstones.  His surgery went well with no complications.  He went home and returned several days later secondary to the "same pancreatitis pain" with nausea and vomiting.  When he returned to the [facility] his lipase was 83, but all other labs were normal.  We were asked to see him.  Objective: Vital signs in last 24 hours: Temp:  [98 F (36.7 C)-99.2 F (37.3 C)] 98 F (36.7 C) (12/12 0300) Pulse Rate:  [101-116] 103  (12/12 0300) Resp:  [14-23] 15  (12/12 0300) BP: (115-180)/(77-110) 136/82 mmHg (12/12 0300) SpO2:  [92 %-100 %] 99 % (12/12 0300) Weight:  [118 lb 1.6 oz (53.57 kg)] 118 lb 1.6 oz (53.57 kg) (12/12 0300)    Intake/Output from previous day:   Intake/Output this shift:    PE: Abd: soft, tender in LUQ, +BS, incisions c/d/i with dermabond present Heart: mechanical valve heard, RRR Lungs: CTAB  Lab Results:   Basename 02/20/12 0541 02/19/12 1722  WBC 5.4 8.1  HGB 8.5* 9.7*  HCT 24.9* 28.5*  PLT 174 226   BMET  Basename 02/20/12 0541 02/19/12 1722  NA 140 139  K 3.4* 3.2*  CL 108 97  CO2 25 31  GLUCOSE 70 96  BUN 17 25*  CREATININE 1.08 1.44*  CALCIUM 7.6* 9.4   PT/INR  Basename 02/20/12 0541 02/19/12 2257  LABPROT 22.8* 22.3*  INR 2.11* 2.05*   CMP     Component Value Date/Time   NA 140 02/20/2012 0541   K 3.4* 02/20/2012 0541   CL 108 02/20/2012 0541   CO2 25 02/20/2012 0541   GLUCOSE 70 02/20/2012 0541   BUN 17 02/20/2012 0541   CREATININE 1.08 02/20/2012 0541   CALCIUM 7.6* 02/20/2012 0541   PROT 6.0 02/20/2012 0541   ALBUMIN 2.5* 02/20/2012 0541   AST 17 02/20/2012 0541   ALT 11 02/20/2012 0541   ALKPHOS 67 02/20/2012 0541   BILITOT 0.2* 02/20/2012 0541   GFRNONAA 82* 02/20/2012 0541   GFRAA >90 02/20/2012 0541   Lipase     Component Value Date/Time   LIPASE 44  02/20/2012 0541       Studies/Results: No results found.  Anti-infectives: Anti-infectives     Start     Dose/Rate Route Frequency Ordered Stop   02/25/12 1000   azithromycin (ZITHROMAX) tablet 1,200 mg        1,200 mg Oral Every Tue 02/20/12 0243     02/20/12 1000   DAPTOmycin (CUBICIN) injection 500 mg  Status:  Discontinued        500 mg Intravenous Daily 02/20/12 0243 02/20/12 0307   02/20/12 1000   tenofovir (VIREAD) tablet 300 mg        300 mg Oral Daily 02/20/12 0243     02/20/12 1000   valACYclovir (VALTREX) tablet 1,000 mg        1,000 mg Oral Daily 02/20/12 0243     02/20/12 1000   dapsone tablet 100 mg        100 mg Oral Daily 02/20/12 0243     02/20/12 1000   ritonavir (NORVIR) capsule 100 mg        100 mg Oral 2 times daily 02/20/12 0243     02/20/12 1000   zidovudine (RETROVIR) capsule 300 mg  300 mg Oral 2 times daily 02/20/12 0243     02/20/12 0800   ciprofloxacin (CIPRO) tablet 500 mg  Status:  Discontinued        500 mg Oral 2 times daily 02/20/12 0243 02/20/12 0308   02/20/12 0800   darunavir (PREZISTA) tablet 600 mg        600 mg Oral 2 times daily with meals 02/20/12 0243     02/20/12 0800   ciprofloxacin (CIPRO) IVPB 400 mg        400 mg 200 mL/hr over 60 Minutes Intravenous Every 12 hours 02/20/12 0312             Assessment/Plan  1. Chronic pancreatitis 2. s/p lap chole 3. HIV  Plan: 1. The patient does not have any evidence of post surgical complications.  This pain and nausea/vomiting is his classic chronic pancreatitis pain.  As I informed he and his wife prior to his surgery, we were unsure that his cholelithiasis was the cause of his chronic pancreatitis.  He does have a history of ETOH use.  I suspect the removal of his gallbladder has not and will not make much difference in the patient's overall symptoms or future outcome.  There is nothing surgical to add at this time.  Patient likely needs a GI doctor, which I believe he  may have a [facility], for follow up of chronic pancreatitis.  We have nothing else to offer at this time.  Please call with questions.  LOS: 1 day    Rustin Erhart E 02/20/2012, 7:58 AM Pager: [phone]

## 2012-02-21 DIAGNOSIS — E876 Hypokalemia: Secondary | ICD-10-CM

## 2012-02-21 DIAGNOSIS — D649 Anemia, unspecified: Secondary | ICD-10-CM

## 2012-02-21 LAB — PROTIME-INR
INR: 2.29 — ABNORMAL HIGH (ref 0.00–1.49)
Prothrombin Time: 24.2 seconds — ABNORMAL HIGH (ref 11.6–15.2)

## 2012-02-21 LAB — BASIC METABOLIC PANEL
CO2: 26 mEq/L (ref 19–32)
Calcium: 8.5 mg/dL (ref 8.4–10.5)
Creatinine, Ser: 1.27 mg/dL (ref 0.50–1.35)
GFR calc Af Amer: 78 mL/min — ABNORMAL LOW (ref >90)
Sodium: 141 mEq/L (ref 135–145)

## 2012-02-21 LAB — CBC
HCT: 26.8 % — ABNORMAL LOW (ref 39.0–52.0)
Hemoglobin: 9.1 g/dL — ABNORMAL LOW (ref 13.0–17.0)
RDW: 18.3 % — ABNORMAL HIGH (ref 11.5–15.5)
WBC: 4.5 10*3/uL (ref 4.0–10.5)

## 2012-02-21 LAB — HEPARIN LEVEL (UNFRACTIONATED): Heparin Unfractionated: 0.43 IU/mL (ref 0.30–0.70)

## 2012-02-21 MED ORDER — WARFARIN SODIUM 7.5 MG PO TABS
7.5000 mg | ORAL_TABLET | Freq: Once | ORAL | Status: DC
  Filled 2012-02-21: qty 1

## 2012-02-21 MED ORDER — BOOST / RESOURCE BREEZE PO LIQD: 1.0000 | Freq: Three times a day (TID) | ORAL | Status: DC

## 2012-02-21 MED ORDER — ENOXAPARIN SODIUM 80 MG/0.8ML ~~LOC~~ SOLN
80.0000 mg | SUBCUTANEOUS | Status: DC
  Filled 2012-02-21: qty 0.8

## 2012-02-21 NOTE — Discharge Summary (Signed)
Physician Discharge Summary  Brandon [name] WUJ:[id number] DOB: [birth year] DOA: 02/19/2012  PCP: [name], MD  Admit date: 02/19/2012 Discharge date: 02/21/2012  Time spent: Greater than 30 minutes  Recommendations for Outpatient Follow-up:  1. With Coumadin clinic at [facility] on 02/24/2012 for repeat labs (PT and INR) and Coumadin dose adjustment. 2. With Dr. Lyda Perone, PCP in one week from hospital discharge.  Discharge Diagnoses:  Principal Problem:  *Abdominal pain Active Problems:  Anemia  S/P AVR (aortic valve replacement)  Septic arthritis of knee  HIV disease  Chronic pancreatitis  Hypokalemia   Discharge Condition: Improved and stable  Diet recommendation: Low-fat diet  Filed Weights   02/20/12 0300 02/21/12 0518  Weight: 53.57 kg (118 lb 1.6 oz) 55.112 kg (121 lb 8 oz)    History of present illness:  46 year old male patient with PMH of HIV on treatment, HTN, chronic pancreatitis, mechanical aortic valve on anticoagulation, septic arthritis of right knee, anemia of chronic disease recently discharged (12/6) after having a laparoscopic cholecystectomy, admitted on 12/12 with worsening abdominal pain, nausea and vomiting which started 2-3 days after discharge. Patient denies coffee grounds or blood in vomitus. Last BM on 12/11 was said to be normal. Surgeons were consulted by EDP. Lipase was minimally elevated.   Hospital Course:   Abdominal pain, nausea and vomiting  Possibly from underlying chronic pancreatitis-? Mild flare.  Surgeons consulted. They do not see any evidence of postsurgical complications.  Patient denies alcohol use.  Start clear liquids and advanced diet as tolerated. He has tolerated a regular consistency diet. Patient sees GI at [facility] advised to followup with them. Still has intermittent mild pain but improved.  Hypokalemia  Repleted  Anemia of chronic disease  No bleeding.  Stable  Aortic valve  replacement  INR: 2.29. Discussed with Ms. Elzie Rings, pharmacist at Coumadin Clinic at [facility] INR 2.5-3.5.  Patient will get a slightly higher dose of Coumadin prior to discharge that 7.5 mg.  He is advised to continue Lovenox (has at home) until reviewed by the Coumadin clinic on Monday 12/16.   HIV  Continue home Antimicrobial regimen.  Followed at the [facility]  Septic arthritis right knee  Pharmacist reviewed prior records and determined that end date for IV daptomycin and oral Cipro was 12/10. Hence these antibiotics will be discontinued.  PICC line will be removed prior to discharge  Outpatient followup at [facility]  Hypertension  Controlled. DC HCTZ.   Recent acute renal failure versus chronic kidney disease  Creatinine on discharge 1.27.   Consultants:  General surgery  Procedures:  None  Discharge Exam:  Complaints Abdominal pain has improved-mild and intermittent. No nausea vomiting. Tolerated regular consistency diet. Occasional right knee pain.  Filed Vitals:   02/20/12 1433 02/20/12 2022 02/21/12 0518 02/21/12 1400  BP: 147/99 136/82 148/91 128/78  Pulse: 91 78 86 85  Temp: 98.4 F (36.9 C) 99 F (37.2 C) 98.7 F (37.1 C) 99.1 F (37.3 C)  TempSrc: Oral Oral Oral Oral  Resp: 16 18 18 18   Height:      Weight:   55.112 kg (121 lb 8 oz)   SpO2: 100% 99% 99% 96%    General exam: Appears comfortable.  Respiratory system: Clear to auscultation. No increased work of breathing.  Cardiovascular system: First and second heart sounds heard, regular rate and rhythm. No JVD, murmurs or gallops. Telemetry shows normal sinus rhythm.  Gastrointestinal system: Abdomen is nondistended and soft. Subjective tenderness but not reproducible  when patient distracted and examined. Normal bowel sounds heard.  Central nervous system: Alert and oriented. No focal deficits.  Extremities: Symmetric 5 x 5 power. Sutures on right knee I&D site are intact without any acute  findings of the right knee. Patient has a PICC line on the left upper arm   Discharge Instructions      Discharge Orders    Future Appointments: Provider: Department: Dept Phone: Center:   03/12/2012 2:30 PM Almond Lint, MD [facility], [place] [phone] None     Future Orders Please Complete By Expires   Increase activity slowly      Discharge instructions      Comments:   Low Fat Diet.   Call MD for:  severe uncontrolled pain      Call MD for:  persistant nausea and vomiting      Call MD for:  temperature >100.4      Call MD for:  redness, tenderness, or signs of infection (pain, swelling, redness, odor or green/yellow discharge around incision site)          Medication List     As of 02/21/2012  6:47 PM    STOP taking these medications         ciprofloxacin 500 MG tablet   Commonly known as: CIPRO      DAPTOmycin 500 MG injection   Commonly known as: CUBICIN      hydrochlorothiazide 25 MG tablet   Commonly known as: HYDRODIURIL      prochlorperazine 10 MG tablet   Commonly known as: COMPAZINE      TAKE these medications         azithromycin 600 MG tablet   Commonly known as: ZITHROMAX   Take 1,200 mg by mouth every 7 (seven) days. Tuesdays      dapsone 100 MG tablet   Take 100 mg by mouth daily.      darunavir 600 MG tablet   Commonly known as: PREZISTA   Take 600 mg by mouth 2 (two) times daily with a meal.      dronabinol 2.5 MG capsule   Commonly known as: MARINOL   Take 1 capsule by mouth 2 (two) times daily. TAKE ONE CAPSULE BY MOUTH TWICE A DAY      enoxaparin 80 MG/0.8ML injection   Commonly known as: LOVENOX   Inject 0.8 mLs (80 mg total) into the skin daily.      EPIPEN IJ   Inject 1 application as directed once as needed. For sever allergic reaction      feeding supplement Liqd   Take 1 Container by mouth 3 (three) times daily between meals.      folic acid 1 MG tablet   Commonly known as: FOLVITE   Take 1 mg by mouth  daily.      isosorbide-hydrALAZINE 20-37.5 MG per tablet   Commonly known as: BIDIL   Take 1 tablet by mouth 2 (two) times daily.      mirtazapine 30 MG tablet   Commonly known as: REMERON   Take 30 mg by mouth at bedtime.      nystatin 100000 UNIT/ML suspension   Commonly known as: MYCOSTATIN   Take 5 mLs by mouth 4 times daily. Take 5 mLs (500,000 Units total) by mouth 4 times daily for 10 days. (swish and swallow each dose)      ondansetron 4 MG tablet   Commonly known as: ZOFRAN   Take 4 mg by mouth every  8 (eight) hours as needed. For nausea      oxyCODONE 15 MG immediate release tablet   Commonly known as: ROXICODONE   Take 1 tablet (15 mg total) by mouth every 8 (eight) hours as needed for pain. Take 1 tablet (15 mg total) by mouth every 8 (eight) hours as needed for 30 doses.      pantoprazole 20 MG tablet   Commonly known as: PROTONIX   Take 40 mg by mouth daily.      ritonavir 100 MG capsule   Commonly known as: NORVIR   Take 100 mg by mouth 2 (two) times daily.      tenofovir 300 MG tablet   Commonly known as: VIREAD   Take 300 mg by mouth daily.      valACYclovir 1000 MG tablet   Commonly known as: VALTREX   Take 1,000 mg by mouth daily.      VIACTIV 500-500-40 MG-UNT-MCG Chew   Generic drug: Calcium-Vitamin D-Vitamin K   Chew 2 tablets by mouth Daily. Take 2 tablets by mouth daily.      vitamin B-12 1000 MCG tablet   Commonly known as: CYANOCOBALAMIN   Take 1,000 mcg by mouth daily.      warfarin 6 MG tablet   Commonly known as: COUMADIN   Take 1 tablet (6 mg total) by mouth daily.      zidovudine 300 MG tablet   Commonly known as: RETROVIR   Take 300 mg by mouth 2 (two) times daily.         Follow-up Information    Follow up with Coumadin Clinic at [facility]. On 02/24/2012. (To be seen with labs (PT, INR))       Follow up with [name], MD. Schedule an appointment as soon as possible for a visit in 1 week.   Contact  information:   [address] [place] [address] [phone]           The results of significant diagnostics from this hospitalization (including imaging, microbiology, ancillary and laboratory) are listed below for reference.        Microbiology: No results found for this or any previous visit (from the past 240 hour(s)).   Labs: Basic Metabolic Panel:  Lab 02/21/12 8295 02/20/12 0541 02/19/12 1722  NA 141 140 139  K 4.2 3.4* 3.2*  CL 107 108 97  CO2 26 25 31   GLUCOSE 88 70 96  BUN 12 17 25*  CREATININE 1.27 1.08 1.44*  CALCIUM 8.5 7.6* 9.4  MG -- -- --  PHOS -- -- --   Liver Function Tests:  Lab 02/20/12 0541 02/19/12 1722  AST 17 26  ALT 11 16  ALKPHOS 67 94  BILITOT 0.2* 0.3  PROT 6.0 8.7*  ALBUMIN 2.5* 3.7    Lab 02/20/12 0541 02/19/12 1722  LIPASE 44 83*  AMYLASE -- --   No results found for this basename: AMMONIA:5 in the last 168 hours CBC:  Lab 02/21/12 0500 02/20/12 0541 02/19/12 1722  WBC 4.5 5.4 8.1  NEUTROABS -- 3.0 4.2  HGB 9.1* 8.5* 9.7*  HCT 26.8* 24.9* 28.5*  MCV 97.1 97.3 96.9  PLT 202 174 226   Cardiac Enzymes: No results found for this basename: CKTOTAL:5,CKMB:5,CKMBINDEX:5,TROPONINI:5 in the last 168 hours BNP: BNP (last 3 results) No results found for this basename: PROBNP:3 in the last 8760 hours CBG: No results found for this basename: GLUCAP:5 in the last 168 hours  SignedMarcellus Scott  [facility] 02/21/2012, 6:47 PM

## 2012-02-21 NOTE — Progress Notes (Addendum)
ANTICOAGULATION CONSULT NOTE - Follow Up Consult  Pharmacy Consult for Heparin, Coumadin, Daptomycin/Cipro Indication: AVR, Septic knee   Allergies  Allergen Reactions  . Bee Venom Anaphylaxis  . Sulfa Antibiotics Anaphylaxis  . Truvada (Emtricitabine-Tenofovir) Anaphylaxis    Takes plain tenofovir at home  . Lidoderm (Lidocaine) Other (See Comments)    Reaction unknown    Patient Measurements: Height: 5\' 9"  (175.3 cm) Weight: 121 lb 8 oz (55.112 kg) IBW/kg (Calculated) : 70.7   Vital Signs: Temp: 98.7 F (37.1 C) (12/13 0518) Temp src: Oral (12/13 0518) BP: 148/91 mmHg (12/13 0518) Pulse Rate: 86  (12/13 0518)  Labs:  Basename 02/21/12 0500 02/20/12 2200 02/20/12 1455 02/20/12 0541 02/19/12 2257 02/19/12 1722  HGB 9.1* -- -- 8.5* -- --  HCT 26.8* -- -- 24.9* -- 28.5*  PLT 202 -- -- 174 -- 226  APTT -- -- -- -- 39* --  LABPROT 24.2* -- -- 22.8* 22.3* --  INR 2.29* -- -- 2.11* 2.05* --  HEPARINUNFRC 0.43 0.34 0.13* -- -- --  CREATININE 1.27 -- -- 1.08 -- 1.44*  CKTOTAL -- -- -- -- -- --  CKMB -- -- -- -- -- --  TROPONINI -- -- -- -- -- --    Estimated Creatinine Clearance: 57.8 ml/min (by C-G formula based on Cr of 1.27).   Medications:  Scheduled Meds:   . azithromycin  1,200 mg Oral Q Tue  . ciprofloxacin  400 mg Intravenous Q12H  . dapsone  100 mg Oral Daily  . DAPTOmycin (CUBICIN)  IV  500 mg Intravenous Q24H  . darunavir  600 mg Oral BID WC  . dronabinol  2.5 mg Oral BID  . feeding supplement  1 Container Oral TID BM  . folic acid  1 mg Oral Daily  . mirtazapine  30 mg Oral QHS  . pantoprazole  40 mg Oral Daily  . ritonavir  100 mg Oral BID  . sodium chloride  3 mL Intravenous Q12H  . tenofovir  300 mg Oral Daily  . valACYclovir  1,000 mg Oral Daily  . vitamin B-12  1,000 mcg Oral Daily  . Warfarin - Pharmacist Dosing Inpatient   Does not apply q1800  . zidovudine  300 mg Oral BID   Continuous Infusions:   . sodium chloride 50 mL (02/20/12  1757)  . heparin 1,450 Units/hr (02/20/12 1539)   Assessment: History of mechanical AVR:  INR is rising towards goal.  He remains therapeutic on Heparin bridging.  Septic knee:  He continues on therapy with IV Daptomycin and IV Cipro. The last CK I see in his records was on 12/3 and was WNL.  Per discharge summary from [facility] these antibiotics were to stop on 12/10.    Goal of Therapy:  INR 2.5-3.5 per [facility] records Heparin level 0.3-0.7   Plan:  Continue Heparin at 1450 units/hr Coumadin 7.5mg  PO x 1 today Check AM Heparin level, CBC, PT/INR Continue Daptomycin 500mg  IV q24h and Cipro 400mg  IV q12h - consider discontinuation as course was to be completed on 12/10 per [facility] physician note Check CK with AM labs for Daptomycin monitoring  Estella Husk, Pharm.D., BCPS Clinical Pharmacist  Phone [phone] Pager [phone] 02/21/2012, 10:10 AM   Addendum:  Spoke with Dr. Waymon Amato regarding antibiotic course.  Per discharge summary from [facility] on 11/25, he was to continue Daptomycin and Cipro for a total of 4 weeks, with a planned stop date of 12/10.  Plans are to discontinue ABX and PICC line prior  to discharge.    Estella Husk, Pharm.D., BCPS Clinical Pharmacist  Phone [phone] Pager [phone] 02/21/2012, 2:47 PM

## 2012-03-12 ENCOUNTER — Ambulatory Visit (INDEPENDENT_AMBULATORY_CARE_PROVIDER_SITE_OTHER): Payer: Medicaid Other | Admitting: General Surgery

## 2012-03-12 ENCOUNTER — Encounter (INDEPENDENT_AMBULATORY_CARE_PROVIDER_SITE_OTHER): Payer: Self-pay | Admitting: General Surgery

## 2012-03-12 VITALS — BP 102/68 | HR 70 | Temp 99.0°F | Resp 19 | Ht 69.0 in | Wt 117.0 lb

## 2012-03-12 DIAGNOSIS — K861 Other chronic pancreatitis: Secondary | ICD-10-CM

## 2012-03-12 MED ORDER — LORAZEPAM 1 MG PO TABS: 1.0000 mg | ORAL_TABLET | Freq: Three times a day (TID) | ORAL | Status: DC | PRN

## 2012-03-12 NOTE — Patient Instructions (Signed)
Get labs drawn.  They should \\fax  them to me.

## 2012-03-12 NOTE — Progress Notes (Signed)
HISTORY: Continues to have pain/nausea post op.  Almost always 4/10, sometimes 10/10.  No fevers/ jaundice, acholic stools, or dark urine.  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      EXAM: General:  Alert and oriented.  thin Incision:  Healing well.     PATHOLOGY: Chronic cholecystitis.     ASSESSMENT AND PLAN:   Chronic pancreatitis Chronic cholecystitis and gallstone pancreatitis.  Still having intermittent pain/nausea Will get CMET and CBC.  Ativan for muscle spasms.      Maudry Diego, MD Surgical Oncology, General & Endocrine Surgery [facility]  [name], MD [name], MD

## 2012-03-12 NOTE — Assessment & Plan Note (Signed)
Chronic cholecystitis and gallstone pancreatitis.

## 2012-03-17 ENCOUNTER — Telehealth (INDEPENDENT_AMBULATORY_CARE_PROVIDER_SITE_OTHER): Payer: Self-pay | Admitting: General Surgery

## 2012-03-17 NOTE — Telephone Encounter (Signed)
Spoke with Huntley Dec at hospice to confirm that we do not need copies of pt's labs.  He has been released from our care.

## 2012-03-17 NOTE — Telephone Encounter (Signed)
We are the not referring provider for hospice. Calls should be directed to primary care or ID physician who put him in hospice.

## 2012-03-17 NOTE — Telephone Encounter (Signed)
Huntley Dec with hospice called to let us know that patient had a glucose of 41 yesterday. He is eating and drinking a small amount. He is having some vomiting. He is seeing Dr Hilma Favors at [facility] (infectious disease) tomorrow. They wanted to report this and to have [place] call them back if anything needs done. [phone].

## 2012-04-03 ENCOUNTER — Other Ambulatory Visit (INDEPENDENT_AMBULATORY_CARE_PROVIDER_SITE_OTHER): Payer: Self-pay | Admitting: General Surgery

## 2012-05-14 ENCOUNTER — Other Ambulatory Visit: Payer: Self-pay | Admitting: Internal Medicine

## 2012-05-14 ENCOUNTER — Other Ambulatory Visit: Payer: Self-pay | Admitting: Infectious Disease

## 2012-05-14 ENCOUNTER — Ambulatory Visit: Payer: Medicaid Other

## 2012-05-14 ENCOUNTER — Other Ambulatory Visit (HOSPITAL_COMMUNITY)
Admission: RE | Admit: 2012-05-14 | Discharge: 2012-05-14 | Disposition: A | Discharge status: Home / Self Care | Payer: Medicaid Other | Source: Ambulatory Visit | Attending: Infectious Disease | Admitting: Infectious Disease

## 2012-05-14 DIAGNOSIS — F4323 Adjustment disorder with mixed anxiety and depressed mood: Secondary | ICD-10-CM

## 2012-05-14 DIAGNOSIS — A6000 Herpesviral infection of urogenital system, unspecified: Secondary | ICD-10-CM

## 2012-05-14 DIAGNOSIS — Z7901 Long term (current) use of anticoagulants: Secondary | ICD-10-CM | POA: Insufficient documentation

## 2012-05-14 DIAGNOSIS — B20 Human immunodeficiency virus [HIV] disease: Secondary | ICD-10-CM

## 2012-05-14 DIAGNOSIS — B351 Tinea unguium: Secondary | ICD-10-CM | POA: Insufficient documentation

## 2012-05-14 DIAGNOSIS — I10 Essential (primary) hypertension: Secondary | ICD-10-CM | POA: Insufficient documentation

## 2012-05-14 DIAGNOSIS — Z113 Encounter for screening for infections with a predominantly sexual mode of transmission: Secondary | ICD-10-CM | POA: Insufficient documentation

## 2012-05-14 DIAGNOSIS — I6350 Cerebral infarction due to unspecified occlusion or stenosis of unspecified cerebral artery: Secondary | ICD-10-CM

## 2012-05-14 LAB — COMPLETE METABOLIC PANEL WITH GFR
ALT: 18 U/L (ref 0–53)
AST: 24 U/L (ref 0–37)
Albumin: 4.4 g/dL (ref 3.5–5.2)
CO2: 30 mEq/L (ref 19–32)
Calcium: 9.3 mg/dL (ref 8.4–10.5)
Chloride: 108 mEq/L (ref 96–112)
Creat: 1.18 mg/dL (ref 0.50–1.35)
GFR, Est African American: 86 mL/min
Potassium: 4.1 mEq/L (ref 3.5–5.3)
Sodium: 143 mEq/L (ref 135–145)
Total Protein: 7.8 g/dL (ref 6.0–8.3)

## 2012-05-14 LAB — LIPID PANEL
HDL: 28 mg/dL — ABNORMAL LOW (ref >39)
LDL Cholesterol: 38 mg/dL (ref 0–99)
Triglycerides: 139 mg/dL (ref <150)

## 2012-05-14 MED ORDER — ONDANSETRON HCL 4 MG PO TABS: 4.0000 mg | ORAL_TABLET | Freq: Three times a day (TID) | ORAL | Status: DC | PRN

## 2012-05-14 MED ORDER — VALACYCLOVIR HCL 1 G PO TABS: 1000.0000 mg | ORAL_TABLET | Freq: Every day | ORAL | Status: DC

## 2012-05-14 MED ORDER — PANTOPRAZOLE SODIUM 40 MG PO TBEC: 40.0000 mg | DELAYED_RELEASE_TABLET | Freq: Every day | ORAL | Status: DC

## 2012-05-14 MED ORDER — DICYCLOMINE HCL 20 MG PO TABS: 20.0000 mg | ORAL_TABLET | Freq: Three times a day (TID) | ORAL | Status: DC

## 2012-05-14 MED ORDER — MORPHINE SULFATE 20 MG/5ML PO SOLN: 5.0000 mg | ORAL | Status: DC | PRN

## 2012-05-14 MED ORDER — PROMETHAZINE HCL 25 MG RE SUPP: 25.0000 mg | Freq: Four times a day (QID) | RECTAL | Status: DC | PRN

## 2012-05-14 NOTE — Progress Notes (Signed)
Patient has extensive history of medical problems.  He attended [facility] prior to December 2013 when he was discharged to hospice.   He recently has been discharged from hospice services since he no longer qualifies.  Pt wants to return to work.  He stated he no longer wanted to go to [facility] for treatment.  His medical history is documented well in Care Everywhere.   He has not been on AART and states he" was left to die with hospice". He gives history of multiple resistance to many HIV medications which was part of his problem and he also has problems swallowing pills.  Pt wants to restart treatment.   His coumadin is currently dosed through [facility] coumadin clinic on Fridays.   Laurell Josephs, RN

## 2012-05-15 LAB — URINALYSIS
Hgb urine dipstick: NEGATIVE
Leukocytes, UA: NEGATIVE
Nitrite: NEGATIVE
Specific Gravity, Urine: 1.02 (ref 1.005–1.030)
Urobilinogen, UA: 0.2 mg/dL (ref 0.0–1.0)
pH: 5.5 (ref 5.0–8.0)

## 2012-05-15 LAB — HEPATITIS B SURFACE ANTIGEN: Hepatitis B Surface Ag: NEGATIVE

## 2012-05-15 LAB — CBC WITH DIFFERENTIAL/PLATELET
Basophils Absolute: 0 10*3/uL (ref 0.0–0.1)
Eosinophils Relative: 5 % (ref 0–5)
Lymphocytes Relative: 51 % — ABNORMAL HIGH (ref 12–46)
Lymphs Abs: 1.6 10*3/uL (ref 0.7–4.0)
MCV: 92.3 fL (ref 78.0–100.0)
Neutro Abs: 1 10*3/uL — ABNORMAL LOW (ref 1.7–7.7)
Neutrophils Relative %: 33 % — ABNORMAL LOW (ref 43–77)
Platelets: 110 10*3/uL — ABNORMAL LOW (ref 150–400)
RBC: 4.52 MIL/uL (ref 4.22–5.81)
RDW: 12.3 % (ref 11.5–15.5)
WBC: 3 10*3/uL — ABNORMAL LOW (ref 4.0–10.5)

## 2012-05-15 LAB — HEPATITIS C ANTIBODY: HCV Ab: NEGATIVE

## 2012-05-16 LAB — HIV-1 RNA ULTRAQUANT REFLEX TO GENTYP+: HIV 1 RNA Quant: 106000 copies/mL — ABNORMAL HIGH (ref <20)

## 2012-05-19 LAB — HEPATITIS A ANTIBODY, TOTAL: Hep A Total Ab: POSITIVE — AB

## 2012-05-20 LAB — HIV-1 GENOTYPR PLUS

## 2012-05-28 ENCOUNTER — Ambulatory Visit (INDEPENDENT_AMBULATORY_CARE_PROVIDER_SITE_OTHER): Payer: Medicaid Other | Admitting: Infectious Disease

## 2012-05-28 ENCOUNTER — Encounter: Payer: Self-pay | Admitting: Infectious Disease

## 2012-05-28 ENCOUNTER — Telehealth: Payer: Self-pay | Admitting: *Deleted

## 2012-05-28 VITALS — BP 158/91 | HR 94 | Temp 98.4°F | Ht 69.0 in | Wt 122.0 lb

## 2012-05-28 DIAGNOSIS — R1310 Dysphagia, unspecified: Secondary | ICD-10-CM

## 2012-05-28 DIAGNOSIS — R21 Rash and other nonspecific skin eruption: Secondary | ICD-10-CM

## 2012-05-28 DIAGNOSIS — A6002 Herpesviral infection of other male genital organs: Secondary | ICD-10-CM

## 2012-05-28 DIAGNOSIS — B370 Candidal stomatitis: Secondary | ICD-10-CM

## 2012-05-28 DIAGNOSIS — K861 Other chronic pancreatitis: Secondary | ICD-10-CM

## 2012-05-28 DIAGNOSIS — A6000 Herpesviral infection of urogenital system, unspecified: Secondary | ICD-10-CM

## 2012-05-28 DIAGNOSIS — B3781 Candidal esophagitis: Secondary | ICD-10-CM

## 2012-05-28 DIAGNOSIS — B20 Human immunodeficiency virus [HIV] disease: Secondary | ICD-10-CM

## 2012-05-28 DIAGNOSIS — M545 Low back pain: Secondary | ICD-10-CM

## 2012-05-28 MED ORDER — CLOBETASOL PROPIONATE 0.05 % EX LOTN: 1.0000 "application " | TOPICAL_LOTION | Freq: Two times a day (BID) | CUTANEOUS | Status: DC

## 2012-05-28 MED ORDER — FLUCONAZOLE 200 MG PO TABS: 200.0000 mg | ORAL_TABLET | Freq: Every day | ORAL | Status: DC

## 2012-05-28 MED ORDER — MORPHINE SULFATE ER 15 MG PO TBCR: 15.0000 mg | EXTENDED_RELEASE_TABLET | Freq: Two times a day (BID) | ORAL | Status: DC

## 2012-05-28 MED ORDER — LORAZEPAM 1 MG PO TABS: 1.0000 mg | ORAL_TABLET | Freq: Three times a day (TID) | ORAL | Status: DC | PRN

## 2012-05-28 MED ORDER — PANTOPRAZOLE SODIUM 40 MG PO TBEC: 40.0000 mg | DELAYED_RELEASE_TABLET | Freq: Every day | ORAL | Status: DC

## 2012-05-28 MED ORDER — DARUNAVIR ETHANOLATE 600 MG PO TABS: 600.0000 mg | ORAL_TABLET | Freq: Two times a day (BID) | ORAL | Status: DC

## 2012-05-28 MED ORDER — MORPHINE SULFATE 15 MG PO TABS: 15.0000 mg | ORAL_TABLET | Freq: Two times a day (BID) | ORAL | Status: DC | PRN

## 2012-05-28 MED ORDER — AZITHROMYCIN 600 MG PO TABS: 1200.0000 mg | ORAL_TABLET | ORAL | Status: DC

## 2012-05-28 MED ORDER — PROMETHAZINE HCL 25 MG RE SUPP: 25.0000 mg | Freq: Four times a day (QID) | RECTAL | Status: DC | PRN

## 2012-05-28 MED ORDER — DOLUTEGRAVIR SODIUM 50 MG PO TABS: 50.0000 mg | ORAL_TABLET | Freq: Every day | ORAL | Status: DC

## 2012-05-28 MED ORDER — TENOFOVIR DISOPROXIL FUMARATE 300 MG PO TABS: 300.0000 mg | ORAL_TABLET | Freq: Every day | ORAL | Status: DC

## 2012-05-28 MED ORDER — LAMIVUDINE-ZIDOVUDINE 150-300 MG PO TABS: 1.0000 | ORAL_TABLET | Freq: Two times a day (BID) | ORAL | Status: DC

## 2012-05-28 MED ORDER — DAPSONE 100 MG PO TABS: 100.0000 mg | ORAL_TABLET | Freq: Every day | ORAL | Status: DC

## 2012-05-28 MED ORDER — ZOLPIDEM TARTRATE 10 MG PO TABS: 10.0000 mg | ORAL_TABLET | Freq: Every evening | ORAL | Status: DC | PRN

## 2012-05-28 MED ORDER — ONDANSETRON 8 MG PO TBDP: 8.0000 mg | ORAL_TABLET | Freq: Three times a day (TID) | ORAL | Status: DC | PRN

## 2012-05-28 NOTE — Patient Instructions (Addendum)
followup next Tuesday with Dr. Daiva Eves in the afternoon 30 minute slot  YOUR NEW REGIMEN  WILL BE:  TIVICAY 50MG  TWICE DAILY  WITH  PREZISTA 600MG  ORANGE PILL TWICE DAILY  NORVIR WHITE PILL  100MG  PILL TWICE DAILY  COMBIVIR WHITE PILL TWICE DAILY  VIREAD BLUE PILL ONCE DAILY  YOU STILL NEED DAPSONE DAILY  AZITHROMYCIN TWO PILLS WEEKLY  YOU WILL BE ON FLUCONAZOLE 200MG  DAILY

## 2012-05-28 NOTE — Telephone Encounter (Signed)
Pharmacist Feliz Beam called requesting to change patient's clobetasol from lotion to cream, because the lotion is not covered by his insurance.

## 2012-05-28 NOTE — Progress Notes (Signed)
Subjective:    Patient ID: Brandon [name], male    DOB: [birth year], 47 y.o.   MRN: [id number]  HPI  Brandon [name] is a highly complicated man with HIV/AIDS and Multi-DRUG RESISTANT virus formerly followed at [facility]. Apparently he had been taken off ARV and placed with hospice who are coming to his home and helping both he and his HIV positive wife.  He has been on various complicated antiretroviral regimens in the past, with unfortunate GENOTYPIC resistance to all non-nucleoside reverse transcriptase inhibitors and all NRTIs, Resistance to all protease inhibitors with the exception of Prezista which had some activity genotypically,, Resistance to Isentress, and Elvitegravir and 100X reduced S to dolutegravir having both a 148H and 140S  and with Dual tropic virus.  He had most recently been on a regimen of Twice daily Prezista, Norvir, twice daily AZT, once daily Viread, with these meds stopped n 02/2012. Prior to this had been on similar regimen but with isentress and maraviroc (despite IN R and dual mixed virus. At that time Dolutegravir was not available)  02/12/2010 phenotype at [facility] showed:  RT: NRTI: ABC, DDI, D4T, AZT, TDF: resistant; 3TC, FTC: susceptible; NNRTI: EFV susceptible; RPV, NVP, ETR, DLV: Resistant; PI: pan-resistant  He has decided now to go back onto ARVS and is referred to [facility].   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  #3 pruritis on his flanks not responsive to Triamcinolone 0.5.   Severe pain radiating down leg for which he has been on  chronic MS contin long acting and short acting.  I spent greater than 90 minutes with the patient including greater than 50% of time in face to face counsel of the patient and in  coordination of their care.   Review of Systems  Constitutional: Positive for activity change, appetite change, fatigue and unexpected weight change. Negative for fever, chills and diaphoresis.  HENT: Positive for sore throat and trouble swallowing. Negative for congestion, rhinorrhea, sneezing and sinus pressure.   Eyes: Negative for photophobia and visual disturbance.  Respiratory: Negative for cough, chest tightness, shortness of breath, wheezing and stridor.   Cardiovascular: Negative for chest pain, palpitations and leg swelling.  Gastrointestinal: Positive for nausea and abdominal pain. Negative for vomiting, diarrhea, constipation, blood in stool, abdominal distention and anal bleeding.  Genitourinary: Negative for dysuria, hematuria, flank pain and difficulty urinating.  Musculoskeletal: Negative for myalgias, back pain, joint swelling, arthralgias and gait problem.  Skin: Positive for rash. Negative for color change and wound.  Neurological: Negative for dizziness, tremors, weakness and light-headedness.  Hematological: Negative for adenopathy. Does not bruise/bleed easily.  Psychiatric/Behavioral: Positive for dysphoric mood and decreased concentration. Negative for behavioral problems, confusion, sleep disturbance and agitation.       Objective:   Physical Exam  Constitutional: He is oriented to person, place, and time. No distress.  HENT:  Head: Normocephalic and atraumatic.  Mouth/Throat: Oropharyngeal exudate and posterior oropharyngeal edema present.    Eyes: Conjunctivae and EOM are normal. Pupils are equal, round, and reactive to light.  Neck: Normal range of motion. Neck supple.  Cardiovascular: Normal rate, regular rhythm and normal heart sounds.  Exam reveals no gallop and no friction rub.   No murmur heard. Pulmonary/Chest: Effort normal and breath sounds normal.  No respiratory distress. He has no wheezes. He has no rales. He exhibits no tenderness.  Abdominal: He  exhibits no distension. There is tenderness. There is no rebound.  Musculoskeletal: He exhibits no edema and no tenderness.  Lymphadenopathy:    He has cervical adenopathy.  Neurological: He is alert and oriented to person, place, and time. He exhibits normal muscle tone. Coordination normal.  Skin: Skin is warm and dry. Rash noted. No erythema. No pallor.  Psychiatric: His behavior is normal. Judgment and thought content normal. His mood appears anxious. He exhibits a depressed mood.          Assessment & Plan:   HIV: Highly Resistant Virus. Prezista with activity by Genotype but not by phenotype.  Tivicay with activity but with 148 and 140 mutations 100 fold less active. He does NOT have any NRTI or NNRTI activity by genotype. Phenotype only showed activity of 3tc/emtricitabine. sustiva by phenotype. 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 If this fails to resolve we will pursue inpatient EGD to look for possible CMV infection or structural changes.  Thrush as above we'll treat with fluconazole.  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 Would consider stopping his valacyclovir.  Rash: We'll try topical clobetasol.  Chronic pain: Average for MS Contin as well as short-acting morphine sulfate immediate release.

## 2012-06-02 ENCOUNTER — Ambulatory Visit (INDEPENDENT_AMBULATORY_CARE_PROVIDER_SITE_OTHER): Payer: Medicaid Other | Admitting: Infectious Disease

## 2012-06-02 ENCOUNTER — Other Ambulatory Visit: Payer: Self-pay | Admitting: Licensed Clinical Social Worker

## 2012-06-02 ENCOUNTER — Other Ambulatory Visit: Payer: Self-pay | Admitting: *Deleted

## 2012-06-02 ENCOUNTER — Encounter: Payer: Self-pay | Admitting: Infectious Disease

## 2012-06-02 VITALS — BP 160/112 | HR 91 | Temp 98.6°F | Wt 123.0 lb

## 2012-06-02 DIAGNOSIS — K861 Other chronic pancreatitis: Secondary | ICD-10-CM

## 2012-06-02 DIAGNOSIS — B20 Human immunodeficiency virus [HIV] disease: Secondary | ICD-10-CM

## 2012-06-02 DIAGNOSIS — B3781 Candidal esophagitis: Secondary | ICD-10-CM

## 2012-06-02 DIAGNOSIS — R21 Rash and other nonspecific skin eruption: Secondary | ICD-10-CM

## 2012-06-02 DIAGNOSIS — R1314 Dysphagia, pharyngoesophageal phase: Secondary | ICD-10-CM

## 2012-06-02 DIAGNOSIS — R1013 Epigastric pain: Secondary | ICD-10-CM

## 2012-06-02 DIAGNOSIS — M009 Pyogenic arthritis, unspecified: Secondary | ICD-10-CM

## 2012-06-02 MED ORDER — FLUCONAZOLE 200 MG PO TABS: 200.0000 mg | ORAL_TABLET | Freq: Every day | ORAL | Status: DC

## 2012-06-02 MED ORDER — DOLUTEGRAVIR SODIUM 50 MG PO TABS: 50.0000 mg | ORAL_TABLET | Freq: Two times a day (BID) | ORAL | Status: DC

## 2012-06-02 MED ORDER — LAMIVUDINE-ZIDOVUDINE 150-300 MG PO TABS: 1.0000 | ORAL_TABLET | Freq: Two times a day (BID) | ORAL | Status: DC

## 2012-06-02 MED ORDER — CLOBETASOL PROPIONATE 0.05 % EX LOTN: 1.0000 "application " | TOPICAL_LOTION | Freq: Two times a day (BID) | CUTANEOUS | Status: DC

## 2012-06-02 MED ORDER — ONDANSETRON 8 MG PO TBDP: 8.0000 mg | ORAL_TABLET | Freq: Three times a day (TID) | ORAL | Status: DC | PRN

## 2012-06-02 MED ORDER — LORAZEPAM 1 MG PO TABS: 1.0000 mg | ORAL_TABLET | Freq: Three times a day (TID) | ORAL | Status: DC | PRN

## 2012-06-02 MED ORDER — MORPHINE SULFATE ER 15 MG PO TBCR: 15.0000 mg | EXTENDED_RELEASE_TABLET | Freq: Two times a day (BID) | ORAL | Status: DC

## 2012-06-02 MED ORDER — DARUNAVIR ETHANOLATE 600 MG PO TABS: 600.0000 mg | ORAL_TABLET | Freq: Two times a day (BID) | ORAL | Status: DC

## 2012-06-02 MED ORDER — MORPHINE SULFATE 15 MG PO TABS: 15.0000 mg | ORAL_TABLET | Freq: Two times a day (BID) | ORAL | Status: DC | PRN

## 2012-06-02 MED ORDER — AZITHROMYCIN 600 MG PO TABS: 1200.0000 mg | ORAL_TABLET | ORAL | Status: DC

## 2012-06-02 MED ORDER — DICYCLOMINE HCL 20 MG PO TABS: 20.0000 mg | ORAL_TABLET | Freq: Three times a day (TID) | ORAL | Status: DC

## 2012-06-02 MED ORDER — DAPSONE 100 MG PO TABS: 100.0000 mg | ORAL_TABLET | Freq: Every day | ORAL | Status: DC

## 2012-06-02 MED ORDER — VALACYCLOVIR HCL 1 G PO TABS: 1000.0000 mg | ORAL_TABLET | Freq: Every day | ORAL | Status: DC

## 2012-06-02 MED ORDER — PANTOPRAZOLE SODIUM 40 MG PO TBEC: 40.0000 mg | DELAYED_RELEASE_TABLET | Freq: Every day | ORAL | Status: DC

## 2012-06-02 MED ORDER — WARFARIN SODIUM 1 MG PO TABS: 1.0000 mg | ORAL_TABLET | Freq: Every day | ORAL | Status: DC

## 2012-06-02 MED ORDER — WARFARIN SODIUM 5 MG PO TABS: 5.0000 mg | ORAL_TABLET | Freq: Every day | ORAL | Status: DC

## 2012-06-02 MED ORDER — PROMETHAZINE HCL 25 MG RE SUPP: 25.0000 mg | Freq: Four times a day (QID) | RECTAL | Status: DC | PRN

## 2012-06-02 MED ORDER — TENOFOVIR DISOPROXIL FUMARATE 300 MG PO TABS: 300.0000 mg | ORAL_TABLET | Freq: Every day | ORAL | Status: DC

## 2012-06-02 MED ORDER — ZOLPIDEM TARTRATE 10 MG PO TABS: 10.0000 mg | ORAL_TABLET | Freq: Every evening | ORAL | Status: DC | PRN

## 2012-06-02 NOTE — Patient Instructions (Signed)
Your regimen is:  TIVICAY 50MG  TWICE DAILY   WITH   PREZISTA 600MG  ORANGE PILL TWICE DAILY   NORVIR WHITE PILL 100MG  PILL TWICE DAILY   COMBIVIR WHITE PILL TWICE DAILY   VIREAD BLUE PILL ONCE DAILY   YOU STILL NEED   DAPSONE DAILY   AZITHROMYCIN TWO PILLS WEEKLY   I am considering having a PICC line placed to give you twice daily FUZEON as well INtravenously  I DO think you would benefit from an upper endoscopy to evaluate cause of pills being stuck in your throat  I also think yhou would benefit from imaging of your knee  I think it would be simpler to have you admitted to the hospital in next few days to get all of this done  I would like to get your medicines in a blister packets to help you take your medicines  I think you would benefit from bridge counselling  Please make an appt in 2 weeks for labs and then followup with Dr. Daiva Eves shortly thereafter regardless

## 2012-06-02 NOTE — Progress Notes (Signed)
Subjective:    Patient ID: Brandon [name], male    DOB: June 08, 1965, 47 y.o.   MRN: [id number]  HPI  Brandon [name] is a highly complicated man with HIV/AIDS and Multi-DRUG RESISTANT virus formerly followed at [facility]. Apparently he had been taken off ARV and placed with hospice who are coming to his home and helping both he and his HIV positive wife.  He has been on various complicated antiretroviral regimens in the past, with unfortunate GENOTYPIC resistance to all non-nucleoside reverse transcriptase inhibitors and all NRTIs, Resistance to all protease inhibitors with the exception of Prezista which had some activity genotypically,, Resistance to Isentress, and Elvitegravir and 100X reduced S to dolutegravir having both a 148H and 140S  and with Dual tropic virus.  He had most recently been on a regimen of Twice daily Prezista, Norvir, twice daily AZT, once daily Viread, with these meds stopped n 02/2012. Prior to this had been on similar regimen but with isentress and maraviroc (despite IN R and dual mixed virus. At that time Dolutegravir was not available)  02/12/2010 phenotype at [facility] showed:  RT: NRTI: ABC, DDI, D4T, AZT, TDF: resistant; 3TC, FTC: susceptible; NNRTI: EFV susceptible; RPV, NVP, ETR, DLV: Resistant; PI: pan-resistant  He had decided now to go back onto ARVS and is referred to [facility].  We saw him in clinic last week and constructed a salvage regimen of Prezista 600mg  boosted with Norvir 100mg  twice daily, Tivicay twice daily, Combivir twice daily and once daily Viread.   Since then his thrush in his motuuth has  Improved.   He IS howver getting pills stuck in his esophagus still and now has "concret sensation when eating and abdomiinal pain.   Over the weekend he experienced swelling and redness and pain in his right knee where he previously had septic arthritis but this improve dafter his Friday dsoe of azithromycin.   I spent greater than 45 minutes with the  patient including greater than 50% of time in face to face counsel of the patient and in coordination of their care.   Review of Systems  Constitutional: Positive for activity change, appetite change, fatigue and unexpected weight change. Negative for fever, chills and diaphoresis.  HENT: Positive for sore throat and trouble swallowing. Negative for congestion, rhinorrhea, sneezing and sinus pressure.   Eyes: Negative for photophobia and visual disturbance.  Respiratory: Negative for cough, chest tightness, shortness of breath, wheezing and stridor.   Cardiovascular: Negative for chest pain, palpitations and leg swelling.  Gastrointestinal: Positive for nausea and abdominal pain. Negative for vomiting, diarrhea, constipation, blood in stool, abdominal distention and anal bleeding.  Genitourinary: Negative for dysuria, hematuria, flank pain and difficulty urinating.  Musculoskeletal: Negative for myalgias, back pain, joint swelling, arthralgias and gait problem.  Skin: Positive for rash. Negative for color change and wound.  Neurological: Negative for dizziness, tremors, weakness and light-headedness.  Hematological: Negative for adenopathy. Does not bruise/bleed easily.  Psychiatric/Behavioral: Positive for dysphoric mood and decreased concentration. Negative for behavioral problems, confusion, sleep disturbance and agitation.       Objective:   Physical Exam  Constitutional: He is oriented to person, place, and time. No distress.  HENT:  Head: Normocephalic and atraumatic.  Mouth/Throat: Oropharyngeal exudate and posterior oropharyngeal edema present.    Eyes: Conjunctivae and EOM are normal. Pupils are equal, round, and reactive to light.  Neck: Normal range of motion. Neck supple.  Cardiovascular: Normal rate, regular rhythm and normal heart sounds.  Exam  reveals no gallop and no friction rub.   No murmur heard. Pulmonary/Chest: Effort normal and breath sounds normal. No respiratory  distress. He has no wheezes. He has no rales. He exhibits no tenderness.  Abdominal: He exhibits no distension. There is tenderness. There is no rebound.  Musculoskeletal: He exhibits no edema and no tenderness.  Lymphadenopathy:    He has cervical adenopathy.  Neurological: He is alert and oriented to person, place, and time. He exhibits normal muscle tone. Coordination normal.  Skin: Skin is warm and dry. Rash noted. No erythema. No pallor.  Psychiatric: His behavior is normal. Judgment and thought content normal. His mood appears anxious. He exhibits a depressed mood.          Assessment & Plan:   HIV: Highly Resistant Virus. Prezista with activity by Genotype but not by phenotype.  Tivicay with activity but with 148 and 140 mutations 100 fold less active. He does NOT have any NRTI or NNRTI activity by genotype. Phenotype only showed activity of 3tc/emtricitabine. sustiva by phenotype. 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 Would consider stopping his valacyclovir.  Rash: continuel clobetasol.  Septic arthritis hx: consider MRI joint  Chronic pain:  Average for MS Contin as well as short-acting morphine sulfate immediate release.  Abd pain: can consider more workup but he has been found in the past to have chronic pancreatitis.

## 2012-06-15 ENCOUNTER — Encounter: Payer: Self-pay | Admitting: Infectious Disease

## 2012-06-15 ENCOUNTER — Ambulatory Visit: Payer: Medicaid Other | Admitting: Infectious Disease

## 2012-06-15 ENCOUNTER — Ambulatory Visit (INDEPENDENT_AMBULATORY_CARE_PROVIDER_SITE_OTHER): Payer: Medicaid Other | Admitting: Infectious Disease

## 2012-06-15 ENCOUNTER — Other Ambulatory Visit: Payer: Self-pay | Admitting: Licensed Clinical Social Worker

## 2012-06-15 VITALS — BP 135/92 | HR 97 | Temp 98.3°F | Wt 122.0 lb

## 2012-06-15 DIAGNOSIS — B20 Human immunodeficiency virus [HIV] disease: Secondary | ICD-10-CM

## 2012-06-15 DIAGNOSIS — R109 Unspecified abdominal pain: Secondary | ICD-10-CM

## 2012-06-15 DIAGNOSIS — R1314 Dysphagia, pharyngoesophageal phase: Secondary | ICD-10-CM

## 2012-06-15 DIAGNOSIS — I82409 Acute embolism and thrombosis of unspecified deep veins of unspecified lower extremity: Secondary | ICD-10-CM

## 2012-06-15 DIAGNOSIS — M009 Pyogenic arthritis, unspecified: Secondary | ICD-10-CM

## 2012-06-15 DIAGNOSIS — B370 Candidal stomatitis: Secondary | ICD-10-CM

## 2012-06-15 MED ORDER — RITONAVIR 100 MG PO TABS: 100.0000 mg | ORAL_TABLET | Freq: Every day | ORAL | Status: DC

## 2012-06-15 MED ORDER — FLUCONAZOLE 100 MG PO TABS: 100.0000 mg | ORAL_TABLET | Freq: Every day | ORAL | Status: DC

## 2012-06-15 NOTE — Progress Notes (Signed)
Subjective:    Patient ID: Brandon [name], male    DOB: 01/12/1966, 47 y.o.   MRN: [id number]  HPI   Mr Eimers is a highly complicated man with HIV/AIDS and Multi-DRUG RESISTANT virus formerly followed at [facility] ID. Apparently he had been taken off ARV and placed with hospice who are coming to his home and helping both he and his HIV positive wife.  He has been on various complicated antiretroviral regimens in the past, with unfortunate GENOTYPIC resistance to all non-nucleoside reverse transcriptase inhibitors and all NRTIs, Resistance to all protease inhibitors with the exception of Prezista which had some activity genotypically,, Resistance to Isentress, and Elvitegravir and 100X reduced S to dolutegravir having both a 148H and 140S  and with Dual tropic virus.  He had most recently been on a regimen of Twice daily Prezista, Norvir, twice daily AZT, once daily Viread, with these meds stopped n 02/2012. Prior to this had been on similar regimen but with isentress and maraviroc (despite IN R and dual mixed virus. At that time Dolutegravir was not available)  02/12/2010 phenotype at [facility] showed:  RT: NRTI: ABC, DDI, D4T, AZT, TDF: resistant; 3TC, FTC: susceptible; NNRTI: EFV susceptible; RPV, NVP, ETR, DLV: Resistant; PI: pan-resistant  He had decided now to go back onto ARVS and is referred to [facility].  We saw him in clinic last week and constructed a salvage regimen of Prezista 600mg  boosted with Norvir 100mg  twice daily, Tivicay twice daily, Combivir twice daily and once daily Viread.   Since then his thrush in his motuuth has  Improved.   I saw him in FU and he was still  getting pills stuck in his esophagus still and now has "concret sensation when eating and abdomiinal pain.  At the time I offered him admission for EGD but he had scheduling problems with schedule  Since then dramatic improvement in thrush. His INR was supratherapeutic due to drug drug interactions but is now  at 2.2  We discussed addition of fuzeon via PICC but he does not yet want to go to that option.  He had problems with [facility] not filling his meds yet.  I spent greater than 45 minutes with the patient including greater than 50% of time in face to face counsel of the patient and in coordination of their care.   Review of Systems  Constitutional: Negative for fever, chills, diaphoresis, activity change, appetite change, fatigue and unexpected weight change.  HENT: Positive for trouble swallowing. Negative for congestion, sore throat, rhinorrhea, sneezing and sinus pressure.   Eyes: Negative for photophobia and visual disturbance.  Respiratory: Negative for cough, chest tightness, shortness of breath, wheezing and stridor.   Cardiovascular: Negative for chest pain, palpitations and leg swelling.  Gastrointestinal: Positive for nausea and abdominal pain. Negative for vomiting, diarrhea, constipation, blood in stool, abdominal distention and anal bleeding.  Genitourinary: Negative for dysuria, hematuria, flank pain and difficulty urinating.  Musculoskeletal: Negative for myalgias, back pain, joint swelling, arthralgias and gait problem.  Skin: Positive for rash. Negative for color change and wound.  Neurological: Negative for dizziness, tremors, weakness and light-headedness.  Hematological: Negative for adenopathy. Does not bruise/bleed easily.  Psychiatric/Behavioral: Negative for behavioral problems, confusion, sleep disturbance, dysphoric mood, decreased concentration and agitation.       Objective:   Physical Exam  Constitutional: He is oriented to person, place, and time. No distress.  HENT:  Head: Normocephalic and atraumatic.  Mouth/Throat: Posterior oropharyngeal edema present. No oropharyngeal  exudate.    Eyes: Conjunctivae and EOM are normal. Pupils are equal, round, and reactive to light.  Neck: Normal range of motion. Neck supple.  Cardiovascular: Normal rate, regular  rhythm and normal heart sounds.  Exam reveals no gallop and no friction rub.   No murmur heard. Pulmonary/Chest: Effort normal and breath sounds normal. No respiratory distress. He has no wheezes. He has no rales. He exhibits no tenderness.  Abdominal: He exhibits no distension. There is no tenderness. There is no rebound.  Musculoskeletal: He exhibits no edema and no tenderness.  Lymphadenopathy:    He has cervical adenopathy.  Neurological: He is alert and oriented to person, place, and time. He exhibits normal muscle tone. Coordination normal.  Skin: Skin is warm and dry. No erythema. No pallor.  Psychiatric: His behavior is normal. Judgment and thought content normal. His mood appears anxious. He exhibits a depressed mood.          Assessment & Plan:   HIV: Highly Resistant Virus. Prezista with activity by Genotype but not by phenotype.  Tivicay with activity but with 148 and 140 mutations 100 fold less active. He does NOT have any NRTI or NNRTI activity by genotype. Phenotype only showed activity of 3tc/emtricitabine. sustiva by phenotype. 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 May consider  Rash: continuel clobetasol.  Septic arthritis hx: consider MRI joint if pain worsens  Chronic pain: continue  MS Contin as well as short-acting morphine sulfate immediate release.  Abd pain: can consider more  workup but he has been found in the past to have chronic pancreatitis.  DVT: on coumadin via clinic at [name]

## 2012-06-15 NOTE — Patient Instructions (Addendum)
WE WILL STRAIGHTEN OUT ISSUES WITH PHARMACY  RTC FOR LABS ON Monday 06/29/12  RTC TO SEE DR VAN DAM 1-2 weeks after that visit

## 2012-06-16 ENCOUNTER — Telehealth: Payer: Self-pay | Admitting: Licensed Clinical Social Worker

## 2012-06-16 NOTE — Telephone Encounter (Signed)
Patient's wife called stating that he is vomiting several times daily, feeling really bad, weak and refusing to go to the hospital. He wanted to come to the clinic tomorrow to be seen. I scheduled with  Dr. Ninetta Lights tomorrow at 10:45am

## 2012-06-17 ENCOUNTER — Ambulatory Visit (INDEPENDENT_AMBULATORY_CARE_PROVIDER_SITE_OTHER): Payer: Medicaid Other | Admitting: Infectious Diseases

## 2012-06-17 ENCOUNTER — Encounter: Payer: Self-pay | Admitting: Infectious Diseases

## 2012-06-17 ENCOUNTER — Other Ambulatory Visit: Payer: Self-pay | Admitting: Licensed Clinical Social Worker

## 2012-06-17 VITALS — BP 126/91 | HR 91 | Temp 98.7°F | Ht 69.0 in | Wt 112.0 lb

## 2012-06-17 DIAGNOSIS — B20 Human immunodeficiency virus [HIV] disease: Secondary | ICD-10-CM

## 2012-06-17 DIAGNOSIS — R112 Nausea with vomiting, unspecified: Secondary | ICD-10-CM

## 2012-06-17 LAB — CBC WITH DIFFERENTIAL/PLATELET
Basophils Absolute: 0 10*3/uL (ref 0.0–0.1)
HCT: 42.7 % (ref 39.0–52.0)
Hemoglobin: 15.1 g/dL (ref 13.0–17.0)
Lymphocytes Relative: 24 % (ref 12–46)
Lymphs Abs: 1.4 10*3/uL (ref 0.7–4.0)
Monocytes Absolute: 0.5 10*3/uL (ref 0.1–1.0)
Neutro Abs: 3.9 10*3/uL (ref 1.7–7.7)
RBC: 4.7 MIL/uL (ref 4.22–5.81)
RDW: 14.3 % (ref 11.5–15.5)
WBC: 5.8 10*3/uL (ref 4.0–10.5)

## 2012-06-17 LAB — COMPREHENSIVE METABOLIC PANEL
ALT: 19 U/L (ref 0–53)
Alkaline Phosphatase: 100 U/L (ref 39–117)
CO2: 26 mEq/L (ref 19–32)
Sodium: 140 mEq/L (ref 135–145)
Total Bilirubin: 0.7 mg/dL (ref 0.3–1.2)
Total Protein: 9.7 g/dL — ABNORMAL HIGH (ref 6.0–8.3)

## 2012-06-17 LAB — LIPASE: Lipase: 38 U/L (ref 0–75)

## 2012-06-17 MED ORDER — RITONAVIR 100 MG PO TABS
100.0000 mg | ORAL_TABLET | Freq: Every day | ORAL | Status: DC
Start: 2012-06-17 — End: 2013-07-13

## 2012-06-17 MED ORDER — SODIUM CHLORIDE 0.9 % IV SOLN: Freq: Once | INTRAVENOUS | Status: AC

## 2012-06-17 NOTE — Addendum Note (Signed)
Addended by: Starleen Arms D on: 06/17/2012 11:34 AM   Modules accepted: Orders

## 2012-06-17 NOTE — Addendum Note (Signed)
Addended by: Jaylyn Booher C on: 06/17/2012 11:34 AM   Modules accepted: Orders

## 2012-06-17 NOTE — Assessment & Plan Note (Signed)
Will check his labs to see if his electrolytes are abn, check his amylase and lipase. Will cont his phenergan and zofran which he has at home. He is encouraged to increase his PO (sports drinks, liquid ensure).will give him 1L IVF now.  He is offered admission to the hospital but refuses, he (and his wife) will let us know if he is not improved.

## 2012-06-17 NOTE — Addendum Note (Signed)
Addended by: Mariea Clonts D on: 06/17/2012 11:41 AM   Modules accepted: Orders

## 2012-06-17 NOTE — Progress Notes (Signed)
  Subjective:    Patient ID: Brandon [name], male    DOB: May 31, 1965, 47 y.o.   MRN: [id number]  HPI 47 yo M with HIV/AIDS and Multi-DRUG RESISTANT virus formerly followed at [facility]. Apparently he had been taken off ARV and placed with hospice who are coming to his home and helping both he and his HIV positive wife.  He has been on various complicated antiretroviral regimens in the past, with unfortunate GENOTYPIC resistance to all non-nucleoside reverse transcriptase inhibitors and all NRTIs, Resistance to all protease inhibitors with the exception of Prezista which had some activity genotypically,, Resistance to Isentress, and Elvitegravir and 100X reduced S to dolutegravir having both a 148H and 140S and with Dual tropic virus.  He had most recently been on a regimen of Twice daily Prezista, Norvir, twice daily AZT, once daily Viread, with these meds stopped n 02/2012. Prior to this had been on similar regimen but with isentress and maraviroc (despite IN R and dual mixed virus. At that time Dolutegravir was not available)  02/12/2010 phenotype at [facility] showed: RT: NRTI: ABC, DDI, D4T, AZT, TDF: resistant; 3TC, FTC: susceptible; NNRTI: EFV susceptible; RPV, NVP, ETR, DLV: Resistant; PI: pan-resistant  When seen in f/u in ID clinic (pt decided to leave hospice and try rx again) on March 21, he was restarted on DRVr/DTV/CBV/TFV.  He comes to clinic today with 24h n/v, 10# wt loss in 2 days. No difference in medications or diet. Has been dizzy and weak all day yesterday. Was unable to take ART yesterday. Has abd pain that is 8/10. Mostly has neck and shoulder pain. Has no photophobia, but does have pain with rotation of his neck (he is able to anterior flex freely). No hematemesis. Emesis was bright green, yellow.  Has been having chills, sweats (soaked the bed). Denies fever.  He also has a hx of chronic pancreatitis.  Has been able to eat ice chips today, nothing yesterday.   Review of  Systems See above.     Objective:   Physical Exam  Constitutional: He appears cachectic.  HENT:  Mouth/Throat: No oropharyngeal exudate.  Eyes: EOM are normal. Pupils are equal, round, and reactive to light.  Neck: Neck supple. Muscular tenderness present. Erythema present.  Cardiovascular: Tachycardia present.   Pulmonary/Chest: Effort normal and breath sounds normal.  Abdominal: Soft. Bowel sounds are normal. There is tenderness in the left upper quadrant. There is no rigidity and no guarding.  Lymphadenopathy:    He has no cervical adenopathy.          Assessment & Plan:

## 2012-06-17 NOTE — Assessment & Plan Note (Signed)
Will f/u with Dr Daiva Eves to assess the efficacy of his new ART.

## 2012-06-18 ENCOUNTER — Ambulatory Visit: Payer: Medicaid Other | Admitting: Internal Medicine

## 2012-06-23 LAB — CULTURE, BLOOD (SINGLE): Organism ID, Bacteria: NO GROWTH

## 2012-06-30 ENCOUNTER — Other Ambulatory Visit: Payer: Medicaid Other

## 2012-06-30 DIAGNOSIS — B20 Human immunodeficiency virus [HIV] disease: Secondary | ICD-10-CM

## 2012-06-30 LAB — CBC WITH DIFFERENTIAL/PLATELET
Basophils Absolute: 0 10*3/uL (ref 0.0–0.1)
Basophils Relative: 0 % (ref 0–1)
Hemoglobin: 11.4 g/dL — ABNORMAL LOW (ref 13.0–17.0)
MCHC: 34.7 g/dL (ref 30.0–36.0)
Neutro Abs: 1.4 10*3/uL — ABNORMAL LOW (ref 1.7–7.7)
Neutrophils Relative %: 44 % (ref 43–77)
RDW: 17.2 % — ABNORMAL HIGH (ref 11.5–15.5)
WBC: 3.1 10*3/uL — ABNORMAL LOW (ref 4.0–10.5)

## 2012-07-01 LAB — COMPLETE METABOLIC PANEL WITHOUT GFR
ALT: 24 U/L (ref 0–53)
AST: 42 U/L — ABNORMAL HIGH (ref 0–37)
Albumin: 4 g/dL (ref 3.5–5.2)
Alkaline Phosphatase: 94 U/L (ref 39–117)
BUN: 10 mg/dL (ref 6–23)
CO2: 27 meq/L (ref 19–32)
Calcium: 9.6 mg/dL (ref 8.4–10.5)
Chloride: 103 meq/L (ref 96–112)
Creat: 1.54 mg/dL — ABNORMAL HIGH (ref 0.50–1.35)
GFR, Est African American: 63 mL/min
GFR, Est Non African American: 54 mL/min — ABNORMAL LOW
Glucose, Bld: 89 mg/dL (ref 70–99)
Potassium: 4.1 meq/L (ref 3.5–5.3)
Sodium: 136 meq/L (ref 135–145)
Total Bilirubin: 0.7 mg/dL (ref 0.3–1.2)
Total Protein: 7.4 g/dL (ref 6.0–8.3)

## 2012-07-01 LAB — T-HELPER CELL (CD4) - (RCID CLINIC ONLY)
CD4 % Helper T Cell: 4 % — ABNORMAL LOW (ref 33–55)
CD4 T Cell Abs: 50 uL — ABNORMAL LOW (ref 400–2700)

## 2012-07-08 LAB — HIV-1 INTEGRASE GENOTYPE

## 2012-07-18 ENCOUNTER — Other Ambulatory Visit: Payer: Self-pay | Admitting: Infectious Disease

## 2012-07-20 ENCOUNTER — Other Ambulatory Visit: Payer: Self-pay | Admitting: Licensed Clinical Social Worker

## 2012-07-20 ENCOUNTER — Encounter: Payer: Self-pay | Admitting: Infectious Disease

## 2012-07-20 ENCOUNTER — Ambulatory Visit (INDEPENDENT_AMBULATORY_CARE_PROVIDER_SITE_OTHER): Payer: Medicaid Other | Admitting: Infectious Disease

## 2012-07-20 VITALS — BP 154/107 | HR 86 | Temp 99.0°F | Ht 69.0 in | Wt 116.0 lb

## 2012-07-20 DIAGNOSIS — B370 Candidal stomatitis: Secondary | ICD-10-CM

## 2012-07-20 DIAGNOSIS — R634 Abnormal weight loss: Secondary | ICD-10-CM

## 2012-07-20 DIAGNOSIS — B20 Human immunodeficiency virus [HIV] disease: Secondary | ICD-10-CM

## 2012-07-20 DIAGNOSIS — R630 Anorexia: Secondary | ICD-10-CM

## 2012-07-20 DIAGNOSIS — R1310 Dysphagia, unspecified: Secondary | ICD-10-CM

## 2012-07-20 MED ORDER — MORPHINE SULFATE 15 MG PO TABS: 15.0000 mg | ORAL_TABLET | Freq: Two times a day (BID) | ORAL | Status: DC | PRN

## 2012-07-20 MED ORDER — MORPHINE SULFATE ER 15 MG PO TBCR: 15.0000 mg | EXTENDED_RELEASE_TABLET | Freq: Two times a day (BID) | ORAL | Status: DC

## 2012-07-20 MED ORDER — MEGESTROL ACETATE 400 MG/10ML PO SUSP: 800.0000 mg | Freq: Every day | ORAL | Status: DC

## 2012-07-20 NOTE — Progress Notes (Signed)
Subjective:    Patient ID: Brandon [name], male    DOB: 11/05/1965, 47 y.o.   MRN: [id number]  HPI  Brandon [name] is a highly complicated man with HIV/AIDS and Multi-DRUG RESISTANT virus formerly followed at [facility]. Apparently he had been taken off ARV and placed with hospice.  He has been on various complicated antiretroviral regimens in the past, with unfortunate GENOTYPIC resistance to all non-nucleoside reverse transcriptase inhibitors and all NRTIs, Resistance to all protease inhibitors with the exception of Prezista which had some activity genotypically,, Resistance to Isentress, and Elvitegravir and 100X reduced S to dolutegravir having both a 148H and 140S  and with Dual tropic virus.  He had most recently been on a regimen of Twice daily Prezista, Norvir, twice daily AZT, once daily Viread, with these meds stopped n 02/2012. Prior to this had been on similar regimen but with isentress and maraviroc (despite IN R and dual mixed virus. At that time Dolutegravir was not available)  02/12/2010 phenotype at [facility] showed:  RT: NRTI: ABC, DDI, D4T, AZT, TDF: resistant; 3TC, FTC: susceptible; NNRTI: EFV susceptible; RPV, NVP, ETR, DLV: Resistant; PI: pan-resistant  He had decided now to go back onto ARVS and is referred to [facility].  We  Have seen him and placed him on a  salvage regimen of Prezista 600mg   Twice daily boosted with Norvir 100mg  twice daily, Tivicay twice daily, Combivir twice daily and once daily Viread.  And since then he has now an UNDETECTABLE VIRAL LOAD <20. CD4 has also risen from 20 to 50   He has still had bouts of nausea with vomiting most recently improving with rectal suppository of phenergan.  He feels his thrush is better but he still does have odynophagia.  I had previously offered him admission for EGD but he had scheduling problems with schedule. He would Like now to have referral to GI for EGD.  He also has noticed that his pharmacy was NOT  prepackaging norvir BID but QD but he had been compensating for that with extra norvir.   We will make sure that is remedied.   His pharmacy also wanted to see if his chronic narcotics could be written with dats for future months and they can continue to deliver monthly to pt  Finally he would like med for weight gain and appetite and I wrote rx for megace.  I spent greater than 45 minutes with the patient including greater than 50% of time in face to face counsel of the patient and in coordination of their care.   Review of Systems  Constitutional: Negative for fever, chills, diaphoresis, activity change, appetite change, fatigue and unexpected weight change.  HENT: Positive for trouble swallowing. Negative for congestion, sore throat, rhinorrhea, sneezing and sinus pressure.   Eyes: Negative for photophobia and visual disturbance.  Respiratory: Negative for cough, chest tightness, shortness of breath, wheezing and stridor.   Cardiovascular: Negative for chest pain, palpitations and leg swelling.  Gastrointestinal: Positive for nausea and abdominal pain. Negative for vomiting, diarrhea, constipation, blood in stool, abdominal distention and anal bleeding.  Genitourinary: Negative for dysuria, hematuria, flank pain and difficulty urinating.  Musculoskeletal: Negative for myalgias, back pain, joint swelling, arthralgias and gait problem.  Skin: Positive for rash. Negative for color change and wound.  Neurological: Negative for dizziness, tremors, weakness and light-headedness.  Hematological: Negative for adenopathy. Does not bruise/bleed easily.  Psychiatric/Behavioral: Negative for behavioral problems, confusion, sleep disturbance, dysphoric mood, decreased concentration and agitation.  Objective:   Physical Exam  Constitutional: He is oriented to person, place, and time. No distress.  HENT:  Head: Normocephalic and atraumatic.  Mouth/Throat: Posterior oropharyngeal edema  present. No oropharyngeal exudate.    Eyes: Conjunctivae and EOM are normal. Pupils are equal, round, and reactive to light.  Neck: Normal range of motion. Neck supple.  Cardiovascular: Normal rate, regular rhythm and normal heart sounds.  Exam reveals no gallop and no friction rub.   No murmur heard. Pulmonary/Chest: Effort normal and breath sounds normal. No respiratory distress. He has no wheezes. He has no rales. He exhibits no tenderness.  Abdominal: He exhibits no distension. There is no tenderness. There is no rebound.  Musculoskeletal: He exhibits no edema and no tenderness.  Lymphadenopathy:    He has cervical adenopathy.  Neurological: He is alert and oriented to person, place, and time. He exhibits normal muscle tone. Coordination normal.  Skin: Skin is warm and dry. No erythema. No pallor.  Psychiatric: His behavior is normal. Judgment and thought content normal. His mood appears anxious. He exhibits a depressed mood.          Assessment & Plan:   HIV: Highly Resistant Virus. Prezista with activity by Genotype but not by phenotype.  Tivicay with activity but with 148 and 140 mutations 100 fold less active. He does NOT have any NRTI or NNRTI activity by genotype. Phenotype only showed activity of 3tc/emtricitabine. sustiva by phenotype. 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 Referring to GI for EGD  If GI does EGD WOULD GET BIOPSY  FOR PATH (IN FORMALIN) AS WELL AS SEPARATE SPECIMEN IN VIRAL TRANSPORT MEDIA FOR CMV BY PCR AND HSV BY PCR,    Rash: continuel clobetasol.  Septic arthritis hx: consider MRI joint if pain worsens  Chronic pain: continue  MS Contin as well as short-acting morphine sulfate immediate release.  Abd pain: can consider more workup but he has been found in the past to have chronic pancreatitis.  DVT: on coumadin via clinic at [facility]

## 2012-07-20 NOTE — Telephone Encounter (Signed)
Patient was only getting Norvir once daily in his blister pack, I called and corrected this to BID at [facility]. Sam the pharmacist is also going to pick up the patient's hard copy of the Morphine narcotics to be filled May 12, June12, and July 12 so they can continue to deliver.

## 2012-07-22 ENCOUNTER — Telehealth: Payer: Self-pay | Admitting: *Deleted

## 2012-07-22 NOTE — Telephone Encounter (Signed)
Called patient and asked if he has a PCP at [facility] and he said he does. Explained that the referral to a GI office would have to come from them because that is who is on his Medicaid card. He said he would call them and request that they make the referral for an EGD test. Wendall Mola CMA

## 2012-07-23 ENCOUNTER — Emergency Department (HOSPITAL_COMMUNITY): Payer: Medicaid Other

## 2012-07-23 ENCOUNTER — Encounter (HOSPITAL_COMMUNITY): Payer: Self-pay | Admitting: Emergency Medicine

## 2012-07-23 ENCOUNTER — Telehealth: Payer: Self-pay | Admitting: *Deleted

## 2012-07-23 ENCOUNTER — Ambulatory Visit (INDEPENDENT_AMBULATORY_CARE_PROVIDER_SITE_OTHER): Payer: Medicaid Other | Admitting: Infectious Disease

## 2012-07-23 ENCOUNTER — Inpatient Hospital Stay (HOSPITAL_COMMUNITY)
Admission: EM | Admit: 2012-07-23 | Discharge: 2012-07-25 | DRG: 975 | Disposition: A | Discharge status: Home / Self Care | Payer: Medicaid Other | Attending: Internal Medicine | Admitting: Internal Medicine

## 2012-07-23 VITALS — BP 175/103 | HR 73 | Temp 97.4°F

## 2012-07-23 DIAGNOSIS — B3781 Candidal esophagitis: Secondary | ICD-10-CM | POA: Diagnosis present

## 2012-07-23 DIAGNOSIS — B20 Human immunodeficiency virus [HIV] disease: Secondary | ICD-10-CM

## 2012-07-23 DIAGNOSIS — Z8673 Personal history of transient ischemic attack (TIA), and cerebral infarction without residual deficits: Secondary | ICD-10-CM

## 2012-07-23 DIAGNOSIS — F411 Generalized anxiety disorder: Secondary | ICD-10-CM | POA: Diagnosis present

## 2012-07-23 DIAGNOSIS — R109 Unspecified abdominal pain: Secondary | ICD-10-CM | POA: Diagnosis present

## 2012-07-23 DIAGNOSIS — M129 Arthropathy, unspecified: Secondary | ICD-10-CM | POA: Diagnosis present

## 2012-07-23 DIAGNOSIS — F4323 Adjustment disorder with mixed anxiety and depressed mood: Secondary | ICD-10-CM | POA: Diagnosis present

## 2012-07-23 DIAGNOSIS — Z954 Presence of other heart-valve replacement: Secondary | ICD-10-CM

## 2012-07-23 DIAGNOSIS — A6000 Herpesviral infection of urogenital system, unspecified: Secondary | ICD-10-CM | POA: Diagnosis present

## 2012-07-23 DIAGNOSIS — F17200 Nicotine dependence, unspecified, uncomplicated: Secondary | ICD-10-CM | POA: Diagnosis present

## 2012-07-23 DIAGNOSIS — K219 Gastro-esophageal reflux disease without esophagitis: Secondary | ICD-10-CM | POA: Diagnosis present

## 2012-07-23 DIAGNOSIS — Z7901 Long term (current) use of anticoagulants: Secondary | ICD-10-CM

## 2012-07-23 DIAGNOSIS — Z952 Presence of prosthetic heart valve: Secondary | ICD-10-CM

## 2012-07-23 DIAGNOSIS — D649 Anemia, unspecified: Secondary | ICD-10-CM | POA: Diagnosis present

## 2012-07-23 DIAGNOSIS — R1310 Dysphagia, unspecified: Secondary | ICD-10-CM | POA: Diagnosis present

## 2012-07-23 DIAGNOSIS — I6350 Cerebral infarction due to unspecified occlusion or stenosis of unspecified cerebral artery: Secondary | ICD-10-CM

## 2012-07-23 DIAGNOSIS — K861 Other chronic pancreatitis: Secondary | ICD-10-CM | POA: Diagnosis present

## 2012-07-23 DIAGNOSIS — B351 Tinea unguium: Secondary | ICD-10-CM

## 2012-07-23 DIAGNOSIS — R112 Nausea with vomiting, unspecified: Secondary | ICD-10-CM

## 2012-07-23 DIAGNOSIS — R1115 Cyclical vomiting syndrome unrelated to migraine: Secondary | ICD-10-CM | POA: Diagnosis present

## 2012-07-23 DIAGNOSIS — N289 Disorder of kidney and ureter, unspecified: Secondary | ICD-10-CM | POA: Diagnosis present

## 2012-07-23 DIAGNOSIS — Z9119 Patient's noncompliance with other medical treatment and regimen: Secondary | ICD-10-CM

## 2012-07-23 DIAGNOSIS — K859 Acute pancreatitis without necrosis or infection, unspecified: Secondary | ICD-10-CM

## 2012-07-23 DIAGNOSIS — M009 Pyogenic arthritis, unspecified: Secondary | ICD-10-CM

## 2012-07-23 DIAGNOSIS — E876 Hypokalemia: Secondary | ICD-10-CM

## 2012-07-23 DIAGNOSIS — I10 Essential (primary) hypertension: Secondary | ICD-10-CM | POA: Diagnosis present

## 2012-07-23 DIAGNOSIS — Z91199 Patient's noncompliance with other medical treatment and regimen due to unspecified reason: Secondary | ICD-10-CM

## 2012-07-23 DIAGNOSIS — E860 Dehydration: Secondary | ICD-10-CM | POA: Diagnosis present

## 2012-07-23 LAB — COMPREHENSIVE METABOLIC PANEL
ALT: 12 U/L (ref 0–53)
Albumin: 4.2 g/dL (ref 3.5–5.2)
Alkaline Phosphatase: 74 U/L (ref 39–117)
BUN: 13 mg/dL (ref 6–23)
Potassium: 3.7 mEq/L (ref 3.5–5.1)
Sodium: 138 mEq/L (ref 135–145)
Total Protein: 7.7 g/dL (ref 6.0–8.3)

## 2012-07-23 LAB — CBC WITH DIFFERENTIAL/PLATELET
Basophils Absolute: 0 10*3/uL (ref 0.0–0.1)
Basophils Relative: 0 % (ref 0–1)
Eosinophils Absolute: 0 10*3/uL (ref 0.0–0.7)
MCH: 34.9 pg — ABNORMAL HIGH (ref 26.0–34.0)
MCHC: 36.2 g/dL — ABNORMAL HIGH (ref 30.0–36.0)
Neutrophils Relative %: 79 % — ABNORMAL HIGH (ref 43–77)
Platelets: 169 10*3/uL (ref 150–400)
RDW: 19.5 % — ABNORMAL HIGH (ref 11.5–15.5)

## 2012-07-23 LAB — URINE MICROSCOPIC-ADD ON

## 2012-07-23 LAB — LIPASE, BLOOD: Lipase: 29 U/L (ref 11–59)

## 2012-07-23 LAB — PROTIME-INR: Prothrombin Time: 19.9 seconds — ABNORMAL HIGH (ref 11.6–15.2)

## 2012-07-23 LAB — URINALYSIS, ROUTINE W REFLEX MICROSCOPIC
Bilirubin Urine: NEGATIVE
Nitrite: NEGATIVE
Specific Gravity, Urine: 1.028 (ref 1.005–1.030)
pH: 5.5 (ref 5.0–8.0)

## 2012-07-23 MED ORDER — ONDANSETRON HCL 4 MG/2ML IJ SOLN
4.0000 mg | Freq: Once | INTRAMUSCULAR | Status: AC
  Administered 2012-07-23: 4 mg via INTRAVENOUS
  Filled 2012-07-23: qty 2

## 2012-07-23 MED ORDER — DEXTROSE-NACL 5-0.9 % IV SOLN
INTRAVENOUS | Status: DC
  Administered 2012-07-23 – 2012-07-25 (×4): via INTRAVENOUS

## 2012-07-23 MED ORDER — IOHEXOL 300 MG/ML  SOLN
25.0000 mL | INTRAMUSCULAR | Status: AC
  Administered 2012-07-23: 25 mL via ORAL

## 2012-07-23 MED ORDER — NYSTATIN 100000 UNIT/ML MT SUSP
5.0000 mL | Freq: Three times a day (TID) | OROMUCOSAL | Status: DC
  Administered 2012-07-23 – 2012-07-25 (×5): 500000 [IU] via ORAL
  Filled 2012-07-23 (×11): qty 5

## 2012-07-23 MED ORDER — DIPHENHYDRAMINE HCL 12.5 MG/5ML PO ELIX
12.5000 mg | ORAL_SOLUTION | Freq: Three times a day (TID) | ORAL | Status: DC | PRN
  Administered 2012-07-23 – 2012-07-24 (×2): 12.5 mg via ORAL
  Filled 2012-07-23 (×2): qty 10

## 2012-07-23 MED ORDER — TENOFOVIR DISOPROXIL FUMARATE 300 MG PO TABS
300.0000 mg | ORAL_TABLET | Freq: Every day | ORAL | Status: DC
  Administered 2012-07-24 – 2012-07-25 (×2): 300 mg via ORAL
  Filled 2012-07-23 (×2): qty 1

## 2012-07-23 MED ORDER — HYDROMORPHONE HCL PF 1 MG/ML IJ SOLN
1.0000 mg | INTRAMUSCULAR | Status: DC | PRN
Start: 2012-07-23 — End: 2012-07-25
  Administered 2012-07-23 – 2012-07-25 (×6): 1 mg via INTRAVENOUS
  Filled 2012-07-23 (×6): qty 1

## 2012-07-23 MED ORDER — MEGESTROL ACETATE 400 MG/10ML PO SUSP
800.0000 mg | Freq: Every day | ORAL | Status: DC
  Administered 2012-07-24 – 2012-07-25 (×2): 800 mg via ORAL
  Filled 2012-07-23 (×2): qty 20

## 2012-07-23 MED ORDER — ONDANSETRON HCL 4 MG PO TABS
4.0000 mg | ORAL_TABLET | Freq: Four times a day (QID) | ORAL | Status: DC | PRN
  Administered 2012-07-24: 4 mg via ORAL
  Filled 2012-07-23: qty 1

## 2012-07-23 MED ORDER — HYDROMORPHONE HCL PF 1 MG/ML IJ SOLN
1.0000 mg | Freq: Once | INTRAMUSCULAR | Status: AC
  Administered 2012-07-23: 1 mg via INTRAVENOUS
  Filled 2012-07-23: qty 1

## 2012-07-23 MED ORDER — RITONAVIR 100 MG PO TABS
100.0000 mg | ORAL_TABLET | Freq: Two times a day (BID) | ORAL | Status: DC
  Administered 2012-07-24 – 2012-07-25 (×3): 100 mg via ORAL
  Filled 2012-07-23 (×6): qty 1

## 2012-07-23 MED ORDER — SODIUM CHLORIDE 0.9 % IV SOLN: INTRAVENOUS | Status: DC

## 2012-07-23 MED ORDER — AZITHROMYCIN 600 MG PO TABS
1200.0000 mg | ORAL_TABLET | ORAL | Status: DC
Start: 2012-07-25 — End: 2012-07-25
  Administered 2012-07-25: 1200 mg via ORAL
  Filled 2012-07-23: qty 2

## 2012-07-23 MED ORDER — DAPSONE 100 MG PO TABS
100.0000 mg | ORAL_TABLET | Freq: Every day | ORAL | Status: DC
  Administered 2012-07-24 – 2012-07-25 (×2): 100 mg via ORAL
  Filled 2012-07-23 (×2): qty 1

## 2012-07-23 MED ORDER — ONDANSETRON HCL 4 MG/2ML IJ SOLN
4.0000 mg | Freq: Four times a day (QID) | INTRAMUSCULAR | Status: DC | PRN
  Administered 2012-07-23: 4 mg via INTRAVENOUS
  Filled 2012-07-23: qty 2

## 2012-07-23 MED ORDER — LORAZEPAM 1 MG PO TABS
1.0000 mg | ORAL_TABLET | Freq: Three times a day (TID) | ORAL | Status: DC | PRN
  Administered 2012-07-24 – 2012-07-25 (×3): 1 mg via ORAL
  Filled 2012-07-23 (×3): qty 1

## 2012-07-23 MED ORDER — ONDANSETRON HCL 4 MG/2ML IJ SOLN: 4.0000 mg | Freq: Three times a day (TID) | INTRAMUSCULAR | Status: DC | PRN

## 2012-07-23 MED ORDER — HYDROMORPHONE HCL PF 1 MG/ML IJ SOLN: 1.0000 mg | INTRAMUSCULAR | Status: DC | PRN

## 2012-07-23 MED ORDER — DOLUTEGRAVIR SODIUM 50 MG PO TABS
50.0000 mg | ORAL_TABLET | Freq: Two times a day (BID) | ORAL | Status: DC
  Administered 2012-07-23 – 2012-07-25 (×4): 50 mg via ORAL
  Filled 2012-07-23 (×6): qty 1

## 2012-07-23 MED ORDER — RITONAVIR 100 MG PO TABS
100.0000 mg | ORAL_TABLET | Freq: Every day | ORAL | Status: DC
  Filled 2012-07-23: qty 1

## 2012-07-23 MED ORDER — LAMIVUDINE-ZIDOVUDINE 150-300 MG PO TABS
1.0000 | ORAL_TABLET | Freq: Two times a day (BID) | ORAL | Status: DC
  Administered 2012-07-23 – 2012-07-25 (×4): 1 via ORAL
  Filled 2012-07-23 (×6): qty 1

## 2012-07-23 MED ORDER — SODIUM CHLORIDE 0.9 % IV SOLN
Freq: Once | INTRAVENOUS | Status: AC
  Administered 2012-07-23: 18:00:00 via INTRAVENOUS

## 2012-07-23 MED ORDER — SODIUM CHLORIDE 0.9 % IV SOLN
1000.0000 mL | Freq: Once | INTRAVENOUS | Status: AC
  Administered 2012-07-23: 1000 mL via INTRAVENOUS

## 2012-07-23 MED ORDER — DARUNAVIR ETHANOLATE 600 MG PO TABS
600.0000 mg | ORAL_TABLET | Freq: Two times a day (BID) | ORAL | Status: DC
  Administered 2012-07-24 – 2012-07-25 (×3): 600 mg via ORAL
  Filled 2012-07-23 (×6): qty 1

## 2012-07-23 MED ORDER — ONDANSETRON 8 MG PO TBDP
8.0000 mg | ORAL_TABLET | Freq: Three times a day (TID) | ORAL | Status: DC | PRN
  Filled 2012-07-23: qty 1

## 2012-07-23 MED ORDER — VALACYCLOVIR HCL 500 MG PO TABS
1000.0000 mg | ORAL_TABLET | Freq: Every day | ORAL | Status: DC
  Administered 2012-07-24: 500 mg via ORAL
  Administered 2012-07-25: 1000 mg via ORAL
  Filled 2012-07-23 (×2): qty 2

## 2012-07-23 MED ORDER — ZOLPIDEM TARTRATE 5 MG PO TABS
10.0000 mg | ORAL_TABLET | Freq: Every evening | ORAL | Status: DC | PRN
  Administered 2012-07-24: 10 mg via ORAL
  Filled 2012-07-23 (×2): qty 1

## 2012-07-23 MED ORDER — IOHEXOL 300 MG/ML  SOLN
100.0000 mL | Freq: Once | INTRAMUSCULAR | Status: AC | PRN
  Administered 2012-07-23: 80 mL via INTRAVENOUS

## 2012-07-23 MED ORDER — PANTOPRAZOLE SODIUM 40 MG PO TBEC
40.0000 mg | DELAYED_RELEASE_TABLET | Freq: Every day | ORAL | Status: DC
  Administered 2012-07-24 – 2012-07-25 (×2): 40 mg via ORAL
  Filled 2012-07-23 (×2): qty 1

## 2012-07-23 NOTE — H&P (Signed)
[facility] History and Physical  RIYANSH GERSTNER ZOX:[id number] DOB: [birth year]    PCP:   None ID:  Dr Paulette Blanch [place]  Chief Complaint:  persistent nausea and vomiting.  Sent in by Dr Daiva Eves.  HPI: Brandon [name] is an 47 y.o. 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  He has intermittently stopped his HIV meds in the past, and was followed previously at [facility].  Now he is seeing Dr Terri Piedra.  He was treated for candida esosphagitis with Diflucan, but his symptoms have been persistent, so he was felt to be dehydrated and likely need EGD, therefore, Dr Daiva Eves sent him in, originally direct admit, but ended up going thru the ER.  Hospitalist was asked to admit him for symptoms, dehydration, and need for EGD procedure.  Rewiew of Systems:  Constitutional: Negative for malaise, fever and chills. No significant weight loss or weight gain Eyes: Negative for eye pain, redness and discharge, diplopia, visual changes, or flashes of light. ENMT: Negative for ear pain, hoarseness, nasal congestion, sinus pressure and sore throat. No headaches; tinnitus, drooling, or problem swallowing. Cardiovascular: Negative for chest pain, palpitations, diaphoresis, dyspnea and peripheral edema. ; No orthopnea, PND Respiratory: Negative for cough, hemoptysis, wheezing and stridor. No pleuritic chestpain. Gastrointestinal: Negative for diarrhea, constipation, melena, blood in stool, hematemesis, jaundice and rectal bleeding.    Genitourinary: Negative for frequency, dysuria, incontinence,flank pain and hematuria; Musculoskeletal: Negative for back pain and neck pain. Negative for swelling and trauma.;  Skin: . Negative for pruritus, rash, abrasions, bruising and skin lesion.;  ulcerations Neuro: Negative for headache, lightheadedness and neck stiffness. Negative for weakness, altered level of consciousness , altered mental status, extremity weakness, burning feet, involuntary movement, seizure and syncope.  Psych: negative for anxiety, depression, insomnia, tearfulness, panic attacks, hallucinations, paranoia, suicidal or homicidal ideation    Past Medical History  Diagnosis Date  . HIV (human immunodeficiency virus infection)   . Hypertension   . Stroke   . Pancreatitis   . Mechanical heart valve present   . Arthritis   . Anemia   . GERD (gastroesophageal reflux disease)   . Abdominal pain   . Weight loss, unintentional   . Constipation   . Nausea & vomiting   . Diarrhea     Past Surgical History  Procedure Laterality Date  . Cardiac surgery    . Knee surgery    . Cholecystectomy  02/12/2012    Procedure: LAPAROSCOPIC CHOLECYSTECTOMY;  Surgeon: Almond Lint, MD;  Location: [facility] OR;  Service: General;  Laterality: N/A;    Medications:  HOME MEDS: Prior to Admission medications   Medication Sig Start Date End Date Taking? Authorizing Provider  azithromycin (ZITHROMAX) 600 MG tablet Take 1,200 mg by mouth every 7 (seven) days. On Saturday   Yes Historical Provider, MD  Clobetasol Propionate 0.05 % lotion Apply 1 application topically 2 (two) times daily as needed (for rash).   Yes Historical Provider, MD  dapsone 100 MG tablet Take 1 tablet (100 mg total) by mouth daily. 06/02/12  Yes Randall Hiss, MD  darunavir (PREZISTA) 600 MG tablet Take 1 tablet (600 mg total) by mouth 2 (two) times daily with a meal. 06/02/12  Yes Randall Hiss, MD  dicyclomine (BENTYL) 20 MG tablet Take  20 mg by mouth 3 (three) times daily.   Yes Historical Provider, MD  Dolutegravir Sodium (TIVICAY) 50 MG TABS Take 50 mg by mouth 2 (two) times daily.   Yes Historical Provider, MD  fluconazole (DIFLUCAN) 100 MG tablet Take 1 tablet (100 mg total) by mouth daily. 06/15/12   Yes Randall Hiss, MD  lamiVUDine-zidovudine (COMBIVIR) 150-300 MG per tablet Take 1 tablet by mouth 2 (two) times daily. 06/02/12  Yes Randall Hiss, MD  LORazepam (ATIVAN) 1 MG tablet Take 1 tablet (1 mg total) by mouth every 8 (eight) hours as needed for anxiety (or muscle spasms). 06/02/12  Yes Randall Hiss, MD  megestrol (MEGACE) 400 MG/10ML suspension Take 20 mLs (800 mg total) by mouth daily. 07/20/12  Yes Randall Hiss, MD  morphine (MS CONTIN) 15 MG 12 hr tablet Take 1 tablet (15 mg total) by mouth 2 (two) times daily. 07/20/12  Yes Randall Hiss, MD  morphine (MSIR) 15 MG tablet Take 1 tablet (15 mg total) by mouth 2 (two) times daily as needed for pain. 07/20/12  Yes Randall Hiss, MD  ondansetron (ZOFRAN ODT) 8 MG disintegrating tablet Take 1 tablet (8 mg total) by mouth every 8 (eight) hours as needed for nausea. 06/02/12  Yes Randall Hiss, MD  pantoprazole (PROTONIX) 40 MG tablet Take 1 tablet (40 mg total) by mouth daily. 06/02/12  Yes Randall Hiss, MD  ritonavir (NORVIR) 100 MG TABS Take 1 tablet (100 mg total) by mouth daily. 06/17/12  Yes Randall Hiss, MD  tenofovir (VIREAD) 300 MG tablet Take 1 tablet (300 mg total) by mouth daily. 06/02/12  Yes Randall Hiss, MD  valACYclovir (VALTREX) 1000 MG tablet Take 1 tablet (1,000 mg total) by mouth daily. 06/02/12  Yes Randall Hiss, MD  warfarin (COUMADIN) 5 MG tablet Take 2.5-5 mg by mouth daily. Alternating days   Yes Historical Provider, MD  zolpidem (AMBIEN) 10 MG tablet Take 1 tablet (10 mg total) by mouth at bedtime as needed for sleep. Take 1 tablet (10 mg total) by mouth nightly as needed for Sleep. May repeat times one as needed 06/02/12  Yes Randall Hiss, MD     Allergies:  Allergies  Allergen Reactions  . Bee Venom Anaphylaxis  . Sulfa Antibiotics Anaphylaxis  . Truvada (Emtricitabine-Tenofovir) Anaphylaxis    Takes plain tenofovir at home  . Lidoderm  (Lidocaine) Other (See Comments)    Reaction unknown  . Raltegravir     resistance  . Sulfamethoxazole Itching  . Ceftriaxone Rash    Social History:   reports that he has been smoking.  He has never used smokeless tobacco. He reports that  drinks alcohol. He reports that he uses illicit drugs (Marijuana) about 7 times per week.  Family History: Family History  Problem Relation Age of Onset  . Hypertension Father   . Cancer - Prostate Father   . Cancer Father     stomach  . Hypertension Sister   . Diabetes Maternal Aunt   . Cancer - Other Cousin   . Parkinson's disease Paternal Aunt      Physical Exam: Filed Vitals:   07/23/12 1830 07/23/12 1845 07/23/12 1926 07/23/12 2100  BP:   132/81 139/80  Pulse: 67 71 70 74  Temp:   98 F (36.7 C)   TempSrc:   Oral   Resp: 8 12 16  SpO2: 100% 100% 100% 100%   Blood pressure 139/80, pulse 74, temperature 98 F (36.7 C), temperature source Oral, resp. rate 16, SpO2 100.00%.  GEN:  Pleasant patient lying in the stretcher in no acute distress; cooperative with exam. PSYCH:  alert and oriented x4; does not appear anxious or depressed; affect is appropriate. HEENT: Mucous membranes pink and anicteric; PERRLA; EOM intact; no cervical lymphadenopathy nor thyromegaly or carotid bruit; no JVD; There were no stridor. Neck is very supple. Breasts:: Not examined CHEST WALL: No tenderness CHEST: Normal respiration, clear to auscultation bilaterally.  HEART: Regular rate and rhythm.  There are no murmur, rub, or gallops.   BACK: No kyphosis or scoliosis; no CVA tenderness ABDOMEN: soft and non-tender; no masses, no organomegaly, normal abdominal bowel sounds; no pannus; no intertriginous candida. There is no rebound and no distention. Rectal Exam: Not done EXTREMITIES: No bone or joint deformity; age-appropriate arthropathy of the hands and knees; no edema; no ulcerations.  There is no calf tenderness. Genitalia: not examined PULSES: 2+ and  symmetric SKIN: Normal hydration no rash or ulceration CNS: Cranial nerves 2-12 grossly intact no focal lateralizing neurologic deficit.  Speech is fluent; uvula elevated with phonation, facial symmetry and tongue midline. DTR are normal bilaterally, cerebella exam is intact, barbinski is negative and strengths are equaled bilaterally.  No sensory loss.   Labs on Admission:  Basic Metabolic Panel:  Recent Labs Lab 07/23/12 1546  NA 138  K 3.7  CL 103  CO2 23  GLUCOSE 135*  BUN 13  CREATININE 1.39*  CALCIUM 9.6   Liver Function Tests:  Recent Labs Lab 07/23/12 1546  AST 21  ALT 12  ALKPHOS 74  BILITOT 0.5  PROT 7.7  ALBUMIN 4.2    Recent Labs Lab 07/23/12 1546  LIPASE 29   No results found for this basename: AMMONIA,  in the last 168 hours CBC:  Recent Labs Lab 07/23/12 1546  WBC 4.7  NEUTROABS 3.7  HGB 11.9*  HCT 32.9*  MCV 96.5  PLT 169   Cardiac Enzymes: No results found for this basename: CKTOTAL, CKMB, CKMBINDEX, TROPONINI,  in the last 168 hours  CBG: No results found for this basename: GLUCAP,  in the last 168 hours   Radiological Exams on Admission: Ct Abdomen Pelvis W Contrast  07/23/2012   *RADIOLOGY REPORT*  Clinical Data: Nausea, abdominal pain.  CT ABDOMEN AND PELVIS WITH CONTRAST  Technique:  Multidetector CT imaging of the abdomen and pelvis was performed following the standard protocol during bolus administration of intravenous contrast.  Contrast: 80mL OMNIPAQUE IOHEXOL 300 MG/ML  SOLN  Comparison: 04/12/2011  Findings: Lung bases are clear.  No effusions.  Heart is normal size.  Liver, spleen, stomach, pancreas, adrenals and kidneys are unremarkable.  No hydronephrosis.  No renal or ureteral stones. Prior cholecystectomy.  Trace free fluid in the pelvis.  Appendix is not definitively seen. No inflammatory process in the right lower quadrant.  Large and small bowel grossly unremarkable.  Aorta is normal caliber. Scattered atherosclerotic  calcifications.  No acute bony abnormality.  IMPRESSION: Trace free fluid in the pelvis.  No inflammatory process seen in the abdomen or pelvis.  Appendix not visualized, but no secondary signs of appendicitis.   Original Report Authenticated By: Charlett Nose, M.D.    Assessment/Plan Present on Admission:  . AIDS . Abdominal pain . Candida esophagitis . Adjustment disorder with mixed anxiety and depressed mood . Genital herpes  PLAN:  Will admit for dehydration, intractable  nausea and vomiting, and dysphagia.  He will get pain meds, IVF, and antiemetics.  Dr Daiva Eves will follow up with him tomorrow.  Please consult GI for possible EGD.  I will give him Nystatin 100K units every 4 hours.  I have continued his home HIV medications. For his genital herpes, will continue Valtrex.  For his AVR, will continue Coumadin per pharmacy's dosing.  He is stable, full code, and will be admitted to [facility] service.     Other plans as per orders.  Code Status: FULL Unk Lightning, MD. [facility] Pager [phone] 7pm to 7am.  07/23/2012, 10:20 PM

## 2012-07-23 NOTE — ED Provider Notes (Signed)
History     CSN: [id number]  Arrival date & time 07/23/12  1427   First MD Initiated Contact with Patient 07/23/12 1458      Chief Complaint  Patient presents with  . Nausea  . Emesis    (Consider location/radiation/quality/duration/timing/severity/associated sxs/prior treatment) HPI  Patient presents with his wife who provides much of the history of present illness due to the patient's severe pain. This episode of pain began approximately 5 hours ago, subtly. Since onset has been focally about the epigastric, severe, not improved with narcotics. There is concurrent anorexia, nausea, vomiting. There is no radiation no diarrhea, no incontinence, just intermittent dyspnea, no syncope. The pain is similar to prior episodes of pancreatitis. Now the patient is HIV positive, with last viral load undetectable, but low CD4 count. The patient had a cholecystectomy 5 months ago.  He was discharged from inpatient hospice care 2 months ago, after 5 months of receiving services.   Past Medical History  Diagnosis Date  . HIV (human immunodeficiency virus infection)   . Hypertension   . Stroke   . Pancreatitis   . Mechanical heart valve present   . Arthritis   . Anemia   . GERD (gastroesophageal reflux disease)   . Abdominal pain   . Weight loss, unintentional   . Constipation   . Nausea & vomiting   . Diarrhea     Past Surgical History  Procedure Laterality Date  . Cardiac surgery    . Knee surgery    . Cholecystectomy  02/12/2012    Procedure: LAPAROSCOPIC CHOLECYSTECTOMY;  Surgeon: Almond Lint, MD;  Location: [facility] OR;  Service: General;  Laterality: N/A;    Family History  Problem Relation Age of Onset  . Hypertension Father   . Cancer - Prostate Father   . Cancer Father     stomach  . Hypertension Sister   . Diabetes Maternal Aunt   . Cancer - Other Cousin   . Parkinson's disease Paternal Aunt     History  Substance Use Topics  . Smoking status: Current Every  Day Smoker -- 0.50 packs/day for 20 years  . Smokeless tobacco: Never Used  . Alcohol Use: Yes     Comment: 12oz beer/ per week       Review of Systems  All other systems reviewed and are negative.    Allergies  Bee venom; Sulfa antibiotics; Truvada; Lidoderm; Raltegravir; Sulfamethoxazole; and Ceftriaxone  Home Medications   Current Outpatient Rx  Name  Route  Sig  Dispense  Refill  . azithromycin (ZITHROMAX) 600 MG tablet   Oral   Take 1,200 mg by mouth every 7 (seven) days. On Saturday         . Clobetasol Propionate 0.05 % lotion   Topical   Apply 1 application topically 2 (two) times daily as needed (for rash).         . dapsone 100 MG tablet   Oral   Take 1 tablet (100 mg total) by mouth daily.   30 tablet   5   . darunavir (PREZISTA) 600 MG tablet   Oral   Take 1 tablet (600 mg total) by mouth 2 (two) times daily with a meal.   60 tablet   3   . dicyclomine (BENTYL) 20 MG tablet   Oral   Take 20 mg by mouth 3 (three) times daily.         Marland Kitchen Dolutegravir Sodium (TIVICAY) 50 MG TABS   Oral  Take 50 mg by mouth 2 (two) times daily.         . fluconazole (DIFLUCAN) 100 MG tablet   Oral   Take 1 tablet (100 mg total) by mouth daily.   10 tablet   0   . lamiVUDine-zidovudine (COMBIVIR) 150-300 MG per tablet   Oral   Take 1 tablet by mouth 2 (two) times daily.   60 tablet   3   . LORazepam (ATIVAN) 1 MG tablet   Oral   Take 1 tablet (1 mg total) by mouth every 8 (eight) hours as needed for anxiety (or muscle spasms).   90 tablet   4   . megestrol (MEGACE) 400 MG/10ML suspension   Oral   Take 20 mLs (800 mg total) by mouth daily.   480 mL   5   . morphine (MS CONTIN) 15 MG 12 hr tablet   Oral   Take 1 tablet (15 mg total) by mouth 2 (two) times daily.   60 tablet   0   . morphine (MSIR) 15 MG tablet   Oral   Take 1 tablet (15 mg total) by mouth 2 (two) times daily as needed for pain.   60 tablet   0   . ondansetron (ZOFRAN  ODT) 8 MG disintegrating tablet   Oral   Take 1 tablet (8 mg total) by mouth every 8 (eight) hours as needed for nausea.   90 tablet   4   . pantoprazole (PROTONIX) 40 MG tablet   Oral   Take 1 tablet (40 mg total) by mouth daily.   30 tablet   11   . ritonavir (NORVIR) 100 MG TABS   Oral   Take 1 tablet (100 mg total) by mouth daily.   30 tablet   11   . tenofovir (VIREAD) 300 MG tablet   Oral   Take 1 tablet (300 mg total) by mouth daily.   30 tablet   3   . valACYclovir (VALTREX) 1000 MG tablet   Oral   Take 1 tablet (1,000 mg total) by mouth daily.   30 tablet   0   . warfarin (COUMADIN) 5 MG tablet   Oral   Take 2.5-5 mg by mouth daily. Alternating days         . zolpidem (AMBIEN) 10 MG tablet   Oral   Take 1 tablet (10 mg total) by mouth at bedtime as needed for sleep. Take 1 tablet (10 mg total) by mouth nightly as needed for Sleep. May repeat times one as needed   30 tablet   4     BP 158/82  Pulse 72  Temp(Src) 97.5 F (36.4 C) (Oral)  Resp 16  SpO2 100%  Physical Exam  Nursing note and vitals reviewed. Constitutional: He is oriented to person, place, and time. He appears well-developed. He has a sickly appearance. He appears ill.  Uncomfortable appearing male, in a left lateral decubitus position  HENT:  Head: Normocephalic and atraumatic.  Eyes: Conjunctivae and EOM are normal.  Cardiovascular: Normal rate and regular rhythm.   Pulmonary/Chest: Effort normal. No stridor. No respiratory distress.  Abdominal: He exhibits no distension. There is no hepatosplenomegaly or hepatomegaly. There is tenderness in the epigastric area and left upper quadrant. There is guarding. There is no rigidity, no rebound and no CVA tenderness.  Musculoskeletal: He exhibits no edema.  Neurological: He is alert and oriented to person, place, and time.  Skin: Skin is warm and  dry.  Psychiatric: He has a normal mood and affect.    ED Course  Procedures (including  critical care time)  Labs Reviewed  CBC WITH DIFFERENTIAL  COMPREHENSIVE METABOLIC PANEL  LIPASE, BLOOD  URINALYSIS, ROUTINE W REFLEX MICROSCOPIC   No results found.   No diagnosis found.   Update: patient remains uncomfortable appearing  O2- 99%ra, normal  Update: I discussed the patient's case with his infectious disease physician.  They will follow as a consulting service.  MDM  This patient with HIV, recently initiating care with our infectious disease team now presents with abdominal pain, by mouth intolerance.  On exam he is awake and alert, though uncomfortable appearing.  Patient's labs are somewhat reassuring, and his CT scan does not demonstrate acute pathology, but concern remains for the lack of by mouth tolerance given his need for medication for his infectious process.  Given the persistence by mouth intolerance, pain, he was admitted for further evaluation and management.     Gerhard Munch, MD 07/23/12 2003

## 2012-07-23 NOTE — Progress Notes (Signed)
ANTICOAGULATION CONSULT NOTE - Initial Consult  Pharmacy Consult for Coumadin, heparin Indication: H/o AVR  Allergies  Allergen Reactions  . Bee Venom Anaphylaxis  . Sulfa Antibiotics Anaphylaxis  . Truvada (Emtricitabine-Tenofovir) Anaphylaxis    Takes plain tenofovir at home  . Lidoderm (Lidocaine) Other (See Comments)    Reaction unknown  . Raltegravir     resistance  . Sulfamethoxazole Itching  . Ceftriaxone Rash    Patient Measurements: Height: 5\' 9"  (175.3 cm) Weight: 115 lb 15.4 oz (52.6 kg) (Per 07/20/12 documentation) IBW/kg (Calculated) : 70.7 Heparin Dosing Weight: 52.6 kg  Vital Signs: Temp: 98.2 F (36.8 C) (05/15 2210) Temp src: Oral (05/15 2210) BP: 148/83 mmHg (05/15 2210) Pulse Rate: 67 (05/15 2210)  Labs:  Recent Labs  07/23/12 1546 07/23/12 2230  HGB 11.9*  --   HCT 32.9*  --   PLT 169  --   LABPROT  --  19.9*  INR  --  1.76*  CREATININE 1.39*  --     Estimated Creatinine Clearance: 50.5 ml/min (by C-G formula based on Cr of 1.39).   Medical History: Past Medical History  Diagnosis Date  . HIV (human immunodeficiency virus infection)   . Hypertension   . Stroke   . Pancreatitis   . Mechanical heart valve present   . Arthritis   . Anemia   . GERD (gastroesophageal reflux disease)   . Abdominal pain   . Weight loss, unintentional   . Constipation   . Nausea & vomiting   . Diarrhea     Medications:  Scheduled:  . [START ON 07/25/2012] azithromycin  1,200 mg Oral Q7 days  . [START ON 07/24/2012] dapsone  100 mg Oral Daily  . [START ON 07/24/2012] darunavir  600 mg Oral BID WC  . Dolutegravir Sodium  50 mg Oral BID  . lamiVUDine-zidovudine  1 tablet Oral BID  . [START ON 07/24/2012] megestrol  800 mg Oral Daily  . nystatin  5 mL Oral TID AC & HS  . [START ON 07/24/2012] pantoprazole  40 mg Oral Daily  . [START ON 07/24/2012] ritonavir  100 mg Oral BID WC  . [START ON 07/24/2012] tenofovir  300 mg Oral Daily  . [START ON 07/24/2012]  valACYclovir  1,000 mg Oral Daily    Assessment: 47 yo male with h/o AVR presented with persistent nausea and vomiting. INR of 1.76. Pharmacy to manage Coumadin and heparin. Home Coumadin regimen of alternating 2.5mg  and 5mg  with last dose 5/14 (5mg ).   During previous hospitalization, IV heparin infusion of 1450 units/hr (~ 27 units/kg/hr) produced at-goal heparin levels.  Goal of Therapy:  INR 2.5 - 3.5  Heparin level 0.3 - 0.7  Monitor platelets by anticoagulation protocol: Yes   Plan:  1. Coumadin 5mg  po x 1.  2. Daily PT / INR 3. Coumadin education with pharmacist.  4. Heparin IV infusion at 1250 units/hr (~ 24 units/kg/hr) 5. Heparin level in 6 hours. 6. Daily CBC, heparin level  Emeline Gins 07/23/2012,11:47 PM

## 2012-07-23 NOTE — Progress Notes (Signed)
Subjective:    Patient ID: Brandon [name], male    DOB: 08-05-1965, 47 y.o.   MRN: [id number]  HPI  Brandon [name] is a highly complicated man with HIV/AIDS and Multi-DRUG RESISTANT virus formerly followed at [facility]. Apparently he had been taken off ARV and placed with hospice.  He has been on various complicated antiretroviral regimens in the past, with unfortunate GENOTYPIC resistance to all non-nucleoside reverse transcriptase inhibitors and all NRTIs, Resistance to all protease inhibitors with the exception of Prezista which had some activity genotypically,, Resistance to Isentress, and Elvitegravir and 100X reduced S to dolutegravir having both a 148H and 140S  and with Dual tropic virus.  He had most recently been on a regimen of Twice daily Prezista, Norvir, twice daily AZT, once daily Viread, with these meds stopped n 02/2012. Prior to this had been on similar regimen but with isentress and maraviroc (despite IN R and dual mixed virus. At that time Dolutegravir was not available)  02/12/2010 phenotype at [facility] showed:  RT: NRTI: ABC, DDI, D4T, AZT, TDF: resistant; 3TC, FTC: susceptible; NNRTI: EFV susceptible; RPV, NVP, ETR, DLV: Resistant; PI: pan-resistant  He had decided now to go back onto ARVS and is referred to [facility].  We  Have seen him and placed him on a  salvage regimen of Prezista 600mg   Twice daily boosted with Norvir 100mg  twice daily, Tivicay twice daily, Combivir twice daily and once daily Viread.  And since then he has now an UNDETECTABLE VIRAL LOAD <20. CD4 has also risen from 20 to 50   He has still had bouts of nausea with vomiting most recently improving with rectal suppository of phenergan.  He feels his [name] is better but he still does have odynophagia.  I had previously offered him admission for EGD but he had scheduling problems with schedule.   I saw him a few days ago and we were in the midst of trying to arrange this when he called today with  intractable nausea and vomiting NOT responding to rectal phenergan. He also has severe LLQ pain and with ? Rebound on exam.  He does suffer from known pancreatitis.   In clinic h3e is bent over double and vomitnig clear material at present. We spent greater than 45 minutes with the patient including greater than 50% of time in face to face counsel of the patient and in coordination of their care including triage and arrangement of transfer to ED at [facility]    Review of Systems  Constitutional: Positive for activity change, appetite change, fatigue and unexpected weight change. Negative for fever, chills and diaphoresis.  HENT: Positive for trouble swallowing. Negative for congestion, sore throat, rhinorrhea, sneezing and sinus pressure.   Eyes: Negative for photophobia and visual disturbance.  Respiratory: Negative for cough, chest tightness, shortness of breath, wheezing and stridor.   Cardiovascular: Negative for chest pain, palpitations and leg swelling.  Gastrointestinal: Positive for nausea, vomiting and abdominal pain. Negative for diarrhea, constipation, blood in stool, abdominal distention and anal bleeding.  Genitourinary: Negative for dysuria, hematuria, flank pain and difficulty urinating.  Musculoskeletal: Negative for myalgias, back pain, joint swelling, arthralgias and gait problem.  Skin: Positive for rash. Negative for color change and wound.  Neurological: Negative for dizziness, tremors, weakness and light-headedness.  Hematological: Negative for adenopathy. Does not bruise/bleed easily.  Psychiatric/Behavioral: Negative for behavioral problems, confusion, sleep disturbance, dysphoric mood, decreased concentration and agitation.       Objective:   Physical Exam  Constitutional: He is oriented to person, place, and time. No distress.  HENT:  Head: Normocephalic and atraumatic.  Mouth/Throat: Posterior oropharyngeal edema present. No oropharyngeal exudate.  Eyes:  Conjunctivae and EOM are normal. Pupils are equal, round, and reactive to light.  Neck: Normal range of motion. Neck supple.  Cardiovascular: Regular rhythm and normal heart sounds.  Exam reveals no gallop and no friction rub.   No murmur heard. Pulmonary/Chest: Effort normal and breath sounds normal. No respiratory distress. He has no wheezes. He has no rales.  Abdominal: He exhibits no distension. There is tenderness. There is rebound.  Musculoskeletal: He exhibits no edema and no tenderness.  Lymphadenopathy:    He has cervical adenopathy.  Neurological: He is alert and oriented to person, place, and time. He exhibits normal muscle tone. Coordination normal.  Skin: Skin is warm and dry. No erythema. No pallor.  Psychiatric: His behavior is normal. Judgment and thought content normal. His mood appears anxious. He exhibits a depressed mood.          Assessment & Plan:    #1 Intractable nausea and vomiting:  --transport to ED for IVF, nausea control, CMP, CBC, LIPASE AMYLASE,  --I WOULD LIKE TO HAVE HIM ADMITTED SO THAT HE CAN UNDERGO EGD WITH BIOPSY FOR   --i WOULD CONSIDER STARTING IV FLUCONAZOLE EMPIRICALLY  HIGH DOSE WHILE CONTROLLING THE VOMITING  CMV BY PCR HSV BY PCR PATHOLOGY  ONCE try to continue the ARVS if possible, I DO NOT AND WOULD NOT have any concerns about drug inducec pancreatitis    #2 HIV: Highly Resistant Virus.   See above discussion Prezista with activity by Genotype but not by phenotype.  Tivicay with activity but with 148 and 140 mutations 100 fold less active. He does NOT have any NRTI or NNRTI activity by genotype. Phenotype only showed activity of 3tc/emtricitabine. sustiva by phenotype. 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

## 2012-07-23 NOTE — ED Notes (Signed)
Urinal at bedside. Pt informed of need for urine sample

## 2012-07-23 NOTE — ED Notes (Signed)
Patient is resting comfortably, with family at bedside.

## 2012-07-23 NOTE — Telephone Encounter (Signed)
Pt's wife called to inform us that Brandon [name] is nauseated with lower quadrant bilat abdominal pain, and vomited (five times) orange color vomit since 10:30am. He has not ate since 9:30 last night and had a cup of black coffee this morning. After his coffee is when these symptoms began. I notified Dr. Daiva Eves. Orders were received to place him on Dr. Zenaida Niece Dam's schedule, administer IV fluids, and work up to admit him to hospital. Pt is to come around 1:30-1:45pm Tacey Heap RN

## 2012-07-23 NOTE — ED Notes (Signed)
Patient transported to CT 

## 2012-07-23 NOTE — ED Notes (Signed)
Called CT and advised them patient had finished his contrast.

## 2012-07-23 NOTE — ED Notes (Signed)
According to EMS, patient woke up nauseated and vomiting.  The patient reported pain in the left upper quadrant rating it 10/10.  The patient responded to his PCP and they called EMS.  The patient was transported to [facility].  He does have a history of Chronic pancreatitis, Hypertension, Hypokalemia and other chronic illnesses.  EMS placed a peripheral and administered 4mg  of Zofran.

## 2012-07-24 ENCOUNTER — Encounter (HOSPITAL_COMMUNITY): Payer: Self-pay | Admitting: *Deleted

## 2012-07-24 ENCOUNTER — Encounter (HOSPITAL_COMMUNITY)
Admission: EM | Disposition: A | Discharge status: Home / Self Care | Payer: Self-pay | Source: Home / Self Care | Attending: Internal Medicine

## 2012-07-24 DIAGNOSIS — Z954 Presence of other heart-valve replacement: Secondary | ICD-10-CM

## 2012-07-24 DIAGNOSIS — R1115 Cyclical vomiting syndrome unrelated to migraine: Secondary | ICD-10-CM

## 2012-07-24 DIAGNOSIS — R1310 Dysphagia, unspecified: Secondary | ICD-10-CM

## 2012-07-24 HISTORY — PX: ESOPHAGOGASTRODUODENOSCOPY: SHX5428

## 2012-07-24 LAB — TSH: TSH: 1.681 u[IU]/mL (ref 0.350–4.500)

## 2012-07-24 LAB — HEPARIN LEVEL (UNFRACTIONATED): Heparin Unfractionated: 0.41 IU/mL (ref 0.30–0.70)

## 2012-07-24 SURGERY — EGD (ESOPHAGOGASTRODUODENOSCOPY)
Anesthesia: Moderate Sedation

## 2012-07-24 MED ORDER — HEPARIN (PORCINE) IN NACL 100-0.45 UNIT/ML-% IJ SOLN
1250.0000 [IU]/h | INTRAMUSCULAR | Status: DC
  Administered 2012-07-24 (×2): 1250 [IU]/h via INTRAVENOUS
  Filled 2012-07-24 (×3): qty 250

## 2012-07-24 MED ORDER — MIDAZOLAM HCL 10 MG/2ML IJ SOLN
INTRAMUSCULAR | Status: DC | PRN
  Administered 2012-07-24 (×3): 2 mg via INTRAVENOUS

## 2012-07-24 MED ORDER — BOOST / RESOURCE BREEZE PO LIQD
1.0000 | Freq: Three times a day (TID) | ORAL | Status: DC
  Administered 2012-07-24 – 2012-07-25 (×2): 1 via ORAL

## 2012-07-24 MED ORDER — WARFARIN SODIUM 5 MG PO TABS
5.0000 mg | ORAL_TABLET | Freq: Once | ORAL | Status: AC
  Administered 2012-07-24: 5 mg via ORAL
  Filled 2012-07-24: qty 1

## 2012-07-24 MED ORDER — FENTANYL CITRATE 0.05 MG/ML IJ SOLN
INTRAMUSCULAR | Status: DC | PRN
  Administered 2012-07-24 (×3): 25 ug via INTRAVENOUS

## 2012-07-24 MED ORDER — SODIUM CHLORIDE 0.9 % IV SOLN: INTRAVENOUS | Status: DC

## 2012-07-24 MED ORDER — WARFARIN - PHARMACIST DOSING INPATIENT: Freq: Every day | Status: DC

## 2012-07-24 MED ORDER — ENSURE COMPLETE PO LIQD
237.0000 mL | Freq: Two times a day (BID) | ORAL | Status: DC
  Administered 2012-07-25: 237 mL via ORAL

## 2012-07-24 NOTE — Consult Note (Signed)
Reason for Consult: Odynophagia and Dysphagia Referring Physician: [facility]  Delbert Phenix HPI: The patient is admitted for odynophagia and dysphagia.  This started "some months ago" and it is associated with weight loss.  He reports a 5 lbs weight loss at his PCP's office.  He has symptoms to both solids and liquids, but he feels that liquids gives him more problems.  The patient has a history of medical noncompliance and at one point he was in Hospice.  The patient has a highly resistance HIV.  As a result of his symptoms a GI consultation was requested for further evaluation and treatment.  His CD4 count has increased from 20 to 50 and he has an undetectable viral load.  In the recent past Dr. Daiva Eves treated him with high dose fluconazole for Candidal esophagitis, but it has not resolved his symptoms.  Past Medical History  Diagnosis Date  . HIV (human immunodeficiency virus infection)   . Hypertension   . Stroke   . Pancreatitis   . Mechanical heart valve present   . Arthritis   . Anemia   . GERD (gastroesophageal reflux disease)   . Abdominal pain   . Weight loss, unintentional   . Constipation   . Nausea & vomiting   . Diarrhea     Past Surgical History  Procedure Laterality Date  . Cardiac surgery    . Knee surgery    . Cholecystectomy  02/12/2012    Procedure: LAPAROSCOPIC CHOLECYSTECTOMY;  Surgeon: Almond Lint, MD;  Location: [facility] OR;  Service: General;  Laterality: N/A;    Family History  Problem Relation Age of Onset  . Hypertension Father   . Cancer - Prostate Father   . Cancer Father     stomach  . Hypertension Sister   . Diabetes Maternal Aunt   . Cancer - Other Cousin   . Parkinson's disease Paternal Aunt     Social History:  reports that he has been smoking.  He has never used smokeless tobacco. He reports that  drinks alcohol. He reports that he uses illicit drugs (Marijuana) about 7 times per week.  Allergies:  Allergies  Allergen Reactions   . Bee Venom Anaphylaxis  . Sulfa Antibiotics Anaphylaxis  . Truvada (Emtricitabine-Tenofovir) Anaphylaxis    Takes plain tenofovir at home  . Lidoderm (Lidocaine) Other (See Comments)    Reaction unknown  . Raltegravir     resistance  . Sulfamethoxazole Itching  . Ceftriaxone Rash    Medications:  Scheduled: . [START ON 07/25/2012] azithromycin  1,200 mg Oral Q7 days  . dapsone  100 mg Oral Daily  . darunavir  600 mg Oral BID WC  . Dolutegravir Sodium  50 mg Oral BID  . feeding supplement  237 mL Oral BID BM  . feeding supplement  1 Container Oral TID WC  . lamiVUDine-zidovudine  1 tablet Oral BID  . megestrol  800 mg Oral Daily  . nystatin  5 mL Oral TID AC & HS  . pantoprazole  40 mg Oral Daily  . ritonavir  100 mg Oral BID WC  . tenofovir  300 mg Oral Daily  . valACYclovir  1,000 mg Oral Daily  . warfarin  5 mg Oral ONCE-1800  . Warfarin - Pharmacist Dosing Inpatient   Does not apply q1800   Continuous: . sodium chloride    . dextrose 5 % and 0.9% NaCl 100 mL/hr at 07/24/12 0849  . heparin 1,250 Units/hr (07/24/12 0030)  Results for orders placed during the hospital encounter of 07/23/12 (from the past 24 hour(s))  CBC WITH DIFFERENTIAL     Status: Abnormal   Collection Time    07/23/12  3:46 PM      Result Value Range   WBC 4.7  4.0 - 10.5 K/uL   RBC 3.41 (*) 4.22 - 5.81 MIL/uL   Hemoglobin 11.9 (*) 13.0 - 17.0 g/dL   HCT 81.1 (*) 91.4 - 78.2 %   MCV 96.5  78.0 - 100.0 fL   MCH 34.9 (*) 26.0 - 34.0 pg   MCHC 36.2 (*) 30.0 - 36.0 g/dL   RDW 95.6 (*) 21.3 - 08.6 %   Platelets 169  150 - 400 K/uL   Neutrophils Relative % 79 (*) 43 - 77 %   Neutro Abs 3.7  1.7 - 7.7 K/uL   Lymphocytes Relative 17  12 - 46 %   Lymphs Abs 0.8  0.7 - 4.0 K/uL   Monocytes Relative 3  3 - 12 %   Monocytes Absolute 0.1  0.1 - 1.0 K/uL   Eosinophils Relative 0  0 - 5 %   Eosinophils Absolute 0.0  0.0 - 0.7 K/uL   Basophils Relative 0  0 - 1 %   Basophils Absolute 0.0  0.0 - 0.1  K/uL  COMPREHENSIVE METABOLIC PANEL     Status: Abnormal   Collection Time    07/23/12  3:46 PM      Result Value Range   Sodium 138  135 - 145 mEq/L   Potassium 3.7  3.5 - 5.1 mEq/L   Chloride 103  96 - 112 mEq/L   CO2 23  19 - 32 mEq/L   Glucose, Bld 135 (*) 70 - 99 mg/dL   BUN 13  6 - 23 mg/dL   Creatinine, Ser 5.78 (*) 0.50 - 1.35 mg/dL   Calcium 9.6  8.4 - 46.9 mg/dL   Total Protein 7.7  6.0 - 8.3 g/dL   Albumin 4.2  3.5 - 5.2 g/dL   AST 21  0 - 37 U/L   ALT 12  0 - 53 U/L   Alkaline Phosphatase 74  39 - 117 U/L   Total Bilirubin 0.5  0.3 - 1.2 mg/dL   GFR calc non Af Amer 60 (*) >90 mL/min   GFR calc Af Amer 70 (*) >90 mL/min  LIPASE, BLOOD     Status: None   Collection Time    07/23/12  3:46 PM      Result Value Range   Lipase 29  11 - 59 U/L  CG4 I-STAT (LACTIC ACID)     Status: None   Collection Time    07/23/12  4:10 PM      Result Value Range   Lactic Acid, Venous 2.19  0.5 - 2.2 mmol/L  URINALYSIS, ROUTINE W REFLEX MICROSCOPIC     Status: Abnormal   Collection Time    07/23/12  7:39 PM      Result Value Range   Color, Urine YELLOW  YELLOW   APPearance CLEAR  CLEAR   Specific Gravity, Urine 1.028  1.005 - 1.030   pH 5.5  5.0 - 8.0   Glucose, UA NEGATIVE  NEGATIVE mg/dL   Hgb urine dipstick TRACE (*) NEGATIVE   Bilirubin Urine NEGATIVE  NEGATIVE   Ketones, ur 15 (*) NEGATIVE mg/dL   Protein, ur 30 (*) NEGATIVE mg/dL   Urobilinogen, UA 0.2  0.0 - 1.0 mg/dL   Nitrite  NEGATIVE  NEGATIVE   Leukocytes, UA NEGATIVE  NEGATIVE  URINE MICROSCOPIC-ADD ON     Status: Abnormal   Collection Time    07/23/12  7:39 PM      Result Value Range   Squamous Epithelial / LPF RARE  RARE   WBC, UA 0-2  <3 WBC/hpf   RBC / HPF 0-2  <3 RBC/hpf   Bacteria, UA RARE  RARE   Casts HYALINE CASTS (*) NEGATIVE   Urine-Other MUCOUS PRESENT    PROTIME-INR     Status: Abnormal   Collection Time    07/23/12 10:30 PM      Result Value Range   Prothrombin Time 19.9 (*) 11.6 - 15.2  seconds   INR 1.76 (*) 0.00 - 1.49  TSH     Status: None   Collection Time    07/24/12  6:33 AM      Result Value Range   TSH 1.681  0.350 - 4.500 uIU/mL  HEPARIN LEVEL (UNFRACTIONATED)     Status: None   Collection Time    07/24/12  6:33 AM      Result Value Range   Heparin Unfractionated 0.41  0.30 - 0.70 IU/mL  PROTIME-INR     Status: Abnormal   Collection Time    07/24/12  6:33 AM      Result Value Range   Prothrombin Time 20.3 (*) 11.6 - 15.2 seconds   INR 1.81 (*) 0.00 - 1.49     Ct Abdomen Pelvis W Contrast  07/23/2012   *RADIOLOGY REPORT*  Clinical Data: Nausea, abdominal pain.  CT ABDOMEN AND PELVIS WITH CONTRAST  Technique:  Multidetector CT imaging of the abdomen and pelvis was performed following the standard protocol during bolus administration of intravenous contrast.  Contrast: 80mL OMNIPAQUE IOHEXOL 300 MG/ML  SOLN  Comparison: 04/12/2011  Findings: Lung bases are clear.  No effusions.  Heart is normal size.  Liver, spleen, stomach, pancreas, adrenals and kidneys are unremarkable.  No hydronephrosis.  No renal or ureteral stones. Prior cholecystectomy.  Trace free fluid in the pelvis.  Appendix is not definitively seen. No inflammatory process in the right lower quadrant.  Large and small bowel grossly unremarkable.  Aorta is normal caliber. Scattered atherosclerotic calcifications.  No acute bony abnormality.  IMPRESSION: Trace free fluid in the pelvis.  No inflammatory process seen in the abdomen or pelvis.  Appendix not visualized, but no secondary signs of appendicitis.   Original Report Authenticated By: Charlett Nose, M.D.    ROS:  As stated above in the HPI otherwise negative.  Blood pressure 114/80, pulse 60, temperature 97.7 F (36.5 C), temperature source Oral, resp. rate 16, height 5\' 9"  (1.753 m), weight 115 lb 15.4 oz (52.6 kg), SpO2 100.00%.    PE: Gen: NAD, Alert and Oriented, irritated with the interview HEENT:  [place]/AT, EOMI Neck: Supple, no LAD Lungs:  CTA Bilaterally CV: RRR without M/G/R ABM: Soft, NTND, +BS Ext: No C/C/E  Assessment/Plan: 1) Odynophagia. 2) Dysphagia. 3) Weight loss. 4) Highly resistant HIV.   The patient requires further evaluation with an EGD for his symptoms.  Currently he is on a heparin drip and my intention is to obtain biopsies of the esophagus even if gross lesions are not identified.  He should be fine with superficial mucosal biopsies of the esophagus.  Plan: 1) EGD now.  Jorie Zee D 07/24/2012, 1:50 PM

## 2012-07-24 NOTE — Progress Notes (Signed)
[facility] PROGRESS NOTE  Brandon [name] ZOX:[id number] DOB: 09/02/1965 DOA: 07/23/2012 PCP: No primary provider on file.  Brief History 47 y.o. male with HIV/AIDS with recent CD4 of 50, which was up from 20 and undetectable viral load. He previously was at [facility] and due to highly resistant virus and poor compliance was sent to hospice and off of ARVs. He saw Dr. Daiva Eves in March of 2014 as a new patient and had multiple problems including odynophagia, persistent genital herpes, and chronic abdominal pain. He was started on high dose fluconazole and has been taking, but has had no improvement in his symptoms. He was getting referred to GI but presented to clinic with severe abdominal pain and sent to ED with concern for pancreatitis. His lipase was WNL and he no longer has severe abdominal pain. Continues to have difficulty swallowing liquids and solids with a "stuck" feeling with both.  Patient states that he finish 14 day course of fluconazole and continues to have dysphagia and odynophagia type symptoms. However, the patient states that he has had these types of symptoms for the past 14 years. He does not recall if he ever had an EGD. He was followed by gastroenterology at [facility] whom he states that a colonoscopy about 5 years ago. He was reportedly negative. The patient complains of chronic epigastric abdominal pain for the past 14 years. He states it has improved since his cholecystectomy on 02/12/2012. Denies any hematemesis, hematochezia, melena, fevers, chills, chest pain, shortness of breath   Assessment/Plan: Intractable vomiting with odynophagia and abdominal pain -I personally spoke with gastroenterology, Dr. Elnoria Howard -EGD is planned for today -N.p.o. for now -CT abdomen and pelvis negative for acute findings -Lipase 29, LFTs normal -Check urine drug screen -UA without pyuria 042 -Continue antiretroviral medications  -Continue PCP prophylaxis and MAI prophylaxis -Management  per ID Renal insufficiency -Baseline serum creatinine has wide variations but appears to be 1.2-1.4 -Continue IV fluids Aortic valve replacement history -Continue warfarin -PharmD assisting with dosing -Continue heparin drip while INR is subtherapeutic Herpes genitalis -Continue Valtrex   Family Communication:   Pt at beside Disposition Plan:   Home when medically stable        Procedures/Studies: Ct Abdomen Pelvis W Contrast  07/23/2012   *RADIOLOGY REPORT*  Clinical Data: Nausea, abdominal pain.  CT ABDOMEN AND PELVIS WITH CONTRAST  Technique:  Multidetector CT imaging of the abdomen and pelvis was performed following the standard protocol during bolus administration of intravenous contrast.  Contrast: 80mL OMNIPAQUE IOHEXOL 300 MG/ML  SOLN  Comparison: 04/12/2011  Findings: Lung bases are clear.  No effusions.  Heart is normal size.  Liver, spleen, stomach, pancreas, adrenals and kidneys are unremarkable.  No hydronephrosis.  No renal or ureteral stones. Prior cholecystectomy.  Trace free fluid in the pelvis.  Appendix is not definitively seen. No inflammatory process in the right lower quadrant.  Large and small bowel grossly unremarkable.  Aorta is normal caliber. Scattered atherosclerotic calcifications.  No acute bony abnormality.  IMPRESSION: Trace free fluid in the pelvis.  No inflammatory process seen in the abdomen or pelvis.  Appendix not visualized, but no secondary signs of appendicitis.   Original Report Authenticated By: Charlett Nose, M.D.         Subjective: Patient has not had any further vomiting since morning. Denies fevers, chills, chest pain, shortness of breath, diarrhea. Abdominal pain unchanged, but better since cholecystectomy. Denies dysuria, hematuria, hematemesis.  Objective: Filed Vitals:   07/23/12 2100 07/23/12 2210  07/23/12 2315 07/24/12 0608  BP: 139/80 148/83  114/80  Pulse: 74 67  60  Temp:  98.2 F (36.8 C)  97.7 F (36.5 C)  TempSrc:   Oral  Oral  Resp:  17  16  Height:   5\' 9"  (1.753 m)   Weight:   52.6 kg (115 lb 15.4 oz)   SpO2: 100% 100%  100%    Intake/Output Summary (Last 24 hours) at 07/24/12 1048 Last data filed at 07/24/12 0517  Gross per 24 hour  Intake   1579 ml  Output   1400 ml  Net    179 ml   Weight change:  Exam:   General:  Pt is alert, follows commands appropriately, not in acute distress  HEENT: No icterus, No thrush,[place]/AT, poor dentition  Cardiovascular: RRR, S1/S2, no rubs, no gallops  Respiratory: CTA bilaterally, no wheezing, no crackles, no rhonchi  Abdomen: Soft/+BS, epigastric tenderness without any peritoneal signs or guarding, non distended, no guarding  Extremities: No edema, No lymphangitis, No petechiae, No rashes, no synovitis  Data Reviewed: Basic Metabolic Panel:  Recent Labs Lab 07/23/12 1546  NA 138  K 3.7  CL 103  CO2 23  GLUCOSE 135*  BUN 13  CREATININE 1.39*  CALCIUM 9.6   Liver Function Tests:  Recent Labs Lab 07/23/12 1546  AST 21  ALT 12  ALKPHOS 74  BILITOT 0.5  PROT 7.7  ALBUMIN 4.2    Recent Labs Lab 07/23/12 1546  LIPASE 29   No results found for this basename: AMMONIA,  in the last 168 hours CBC:  Recent Labs Lab 07/23/12 1546  WBC 4.7  NEUTROABS 3.7  HGB 11.9*  HCT 32.9*  MCV 96.5  PLT 169   Cardiac Enzymes: No results found for this basename: CKTOTAL, CKMB, CKMBINDEX, TROPONINI,  in the last 168 hours BNP: No components found with this basename: POCBNP,  CBG: No results found for this basename: GLUCAP,  in the last 168 hours  No results found for this or any previous visit (from the past 240 hour(s)).   Scheduled Meds: . [START ON 07/25/2012] azithromycin  1,200 mg Oral Q7 days  . dapsone  100 mg Oral Daily  . darunavir  600 mg Oral BID WC  . Dolutegravir Sodium  50 mg Oral BID  . lamiVUDine-zidovudine  1 tablet Oral BID  . megestrol  800 mg Oral Daily  . nystatin  5 mL Oral TID AC & HS  . pantoprazole  40 mg  Oral Daily  . ritonavir  100 mg Oral BID WC  . tenofovir  300 mg Oral Daily  . valACYclovir  1,000 mg Oral Daily  . warfarin  5 mg Oral ONCE-1800  . Warfarin - Pharmacist Dosing Inpatient   Does not apply q1800   Continuous Infusions: . dextrose 5 % and 0.9% NaCl 100 mL/hr at 07/24/12 0849  . heparin 1,250 Units/hr (07/24/12 0030)     Nijel Flink, DO  [facility] Pager [phone]  If 7PM-7AM, please contact night-coverage [email or web address] Password [email or web address] 07/24/2012, 10:48 AM   LOS: 1 day

## 2012-07-24 NOTE — Consult Note (Signed)
[facility]     Reason for Consult: HIV/AIDS, odynophagia    Referring Physician: Dr. Arbutus Leas  Active Problems:   Abdominal pain   S/P AVR (aortic valve replacement)   AIDS   Adjustment disorder with mixed anxiety and depressed mood   Genital herpes   Long term (current) use of anticoagulants   Candida esophagitis   Intractable nausea and vomiting   . [START ON 07/25/2012] azithromycin  1,200 mg Oral Q7 days  . dapsone  100 mg Oral Daily  . darunavir  600 mg Oral BID WC  . Dolutegravir Sodium  50 mg Oral BID  . lamiVUDine-zidovudine  1 tablet Oral BID  . megestrol  800 mg Oral Daily  . nystatin  5 mL Oral TID AC & HS  . pantoprazole  40 mg Oral Daily  . ritonavir  100 mg Oral BID WC  . tenofovir  300 mg Oral Daily  . valACYclovir  1,000 mg Oral Daily  . warfarin  5 mg Oral ONCE-1800  . Warfarin - Pharmacist Dosing Inpatient   Does not apply q1800    Recommendations: EGD, I have called GI on call (Dr. Elnoria Howard) and to see, will make NPO now for possible procedure today Took his am ARVs now so if EGD done, Brandon [name] can take them again tonight to avoid developing any more resistance ARVs appropriate I discussed restarting fluconazole IV but patient is reluctant since it has not helped so will hold off for now and await EGD (? Candida, resistant Candida, ?HSV, ?CMV)  Appreciate [facility] help  Assessment: Brandon [name] has AIDS with a low, but improving CD4 count on a deep salvage regimen that appears to be working, though likely is a fragile regimen.  Brandon [name] has had difficulty swallowing both solids and liquids and has been on fluconazole for 2 weeks with no improvement.  Brandon [name] was seen in clinic and had signifcant abdominal pain, n/v and unable to keep fluids down and sent in to ED for concern of pancreatitis, though lipase is WNL.  Patient now without significant vomiting but abdominal pain constant.     Antibiotics: ARVs all appropriate  HPI: Brandon [name] is a 47  y.o. male with HIV/AIDS with recent CD4 of 50, which was up from 20 and undetectable viral load.  Brandon [name] previously was at [facility] and due to highly resistant virus and poor compliance was sent to hospice and off of ARVs.  Brandon [name] saw Dr. Daiva Eves in March of 2014 as a new patient and had multiple problems including odynophagia, persistent genital herpes, and chronic abdominal pain.  Brandon [name] was started on high dose fluconazole and has been taking, but has had no improvement in his symptoms.  Brandon [name] was getting referred to GI but presented to clinic with severe abdominal pain and sent to ED with concern for pancreatitis.  His lipase was WNL and Brandon [name] no longer has severe abdominal pain.  Continues to have difficulty swallowing liquids and solids with a "stuck" feeling with both.  No fever, no chills.     Review of Systems: Pertinent items are noted in HPI.  Past Medical History  Diagnosis Date  . HIV (human immunodeficiency virus infection)   . Hypertension   . Stroke   . Pancreatitis   . Mechanical heart valve present   . Arthritis   . Anemia   . GERD (gastroesophageal reflux disease)   . Abdominal pain   . Weight loss, unintentional   .  Constipation   . Nausea & vomiting   . Diarrhea     History  Substance Use Topics  . Smoking status: Current Every Day Smoker -- 0.50 packs/day for 20 years  . Smokeless tobacco: Never Used  . Alcohol Use: Yes     Comment: 12oz beer/ per week     Family History  Problem Relation Age of Onset  . Hypertension Father   . Cancer - Prostate Father   . Cancer Father     stomach  . Hypertension Sister   . Diabetes Maternal Aunt   . Cancer - Other Cousin   . Parkinson's disease Paternal Aunt    Allergies  Allergen Reactions  . Bee Venom Anaphylaxis  . Sulfa Antibiotics Anaphylaxis  . Truvada (Emtricitabine-Tenofovir) Anaphylaxis    Takes plain tenofovir at home  . Lidoderm (Lidocaine) Other (See Comments)    Reaction unknown  . Raltegravir     resistance  .  Sulfamethoxazole Itching  . Ceftriaxone Rash    OBJECTIVE: Blood pressure 114/80, pulse 60, temperature 97.7 F (36.5 C), temperature source Oral, resp. rate 16, height 5\' 9"  (1.753 m), weight 115 lb 15.4 oz (52.6 kg), SpO2 100.00%. General: Awake, alert, nad Skin: no rashes Lungs: CTA B Cor: RRR without m/r/g Abdomen: soft, nt, nd   Microbiology: No results found for this or any previous visit (from the past 240 hour(s)).  Staci Righter, MD [facility] [phone] pager  [phone] cell 07/24/2012, 10:18 AM

## 2012-07-24 NOTE — Progress Notes (Signed)
INITIAL NUTRITION ASSESSMENT  DOCUMENTATION CODES Per approved criteria  -Non-severe (moderate) malnutrition in the context of social or environmental circumstances   INTERVENTION: 1.  Modify diet; supplements once diet ordered.  Pt requests Breeze with meals, each supplement provides 250 kcal and 9 grams of protein.  Strawberry Ensure BID between meals, each supplement provides 350 kcal and 13 grams of protein. 2.  Modify diet; pt requests no pork or pork derivatives.   NUTRITION DIAGNOSIS: Underweight related to poor appetite and intake as evidenced by pt report.   Monitor:  1.  Food/Beverage; improvement in intake to pt meeting >/=90% estimated needs. 2.  Wt/wt change; deter loss, promote wt gain  Reason for Assessment: Brandon [name]  47 y.o. male  Admitting Dx: abdominal pain  ASSESSMENT: Pt admitted with abdominal pain.  Pt reports poor appetite and intake PTA.  Pt states he was recently started on Megace to assist with poor appetite.  Limited recall provided by pt: Breakfast:  Coffee and juice Lunch:  Light snacks Dinner: full entree prepared at home.   Pt acknowledges diet is not adequate, however states that poor appetite and early satiety prevent adequate intake.  Discussed improving intake by incorporating high calorie foods and beverages.  Pt states he has consumed supplements in the past and likes them.  High-calorie beverages may encourage intake without providing satiety.  Will order for pt.  Nutrition Focused Physical Exam:  Subcutaneous Fat:  Orbital Region: wnl Upper Arm Region: moderate Thoracic and Lumbar Region: n/a  Muscle:  Temple Region: moderate Clavicle Bone Region: moderate Clavicle and Acromion Bone Region: wnl Scapular Bone Region: n/a Dorsal Hand: wnl Patellar Region: n/a Anterior Thigh Region: n/a Posterior Calf Region: n/a  Edema: none present  Pt qualifies for moderate malnutrition of chronic illness given poor intake determined by recall as  well as moderate subcutaneous and muscle mass identified on physical exam.   Height: Ht Readings from Last 1 Encounters:  07/23/12 5\' 9"  (1.753 m)    Weight: Wt Readings from Last 1 Encounters:  07/23/12 115 lb 15.4 oz (52.6 kg)    Ideal Body Weight: 72.7 kg  % Ideal Body Weight: 72%  Wt Readings from Last 10 Encounters:  07/23/12 115 lb 15.4 oz (52.6 kg)  07/23/12 115 lb 15.4 oz (52.6 kg)  07/20/12 116 lb (52.617 kg)  06/17/12 112 lb (50.803 kg)  06/15/12 122 lb (55.339 kg)  06/02/12 123 lb (55.792 kg)  05/28/12 122 lb (55.339 kg)  03/12/12 117 lb (53.071 kg)  02/21/12 121 lb 8 oz (55.112 kg)  02/09/12 120 lb 5.9 oz (54.6 kg)    Usual Body Weight: 120 lbs  % Usual Body Weight: 95%  BMI:  Body mass index is 17.12 kg/(m^2).  Estimated Nutritional Needs: Kcal: 1800-2050 Protein: 75-90g Fluid: >1.8 L/day  Skin: intact  Diet Order: NPO  EDUCATION NEEDS: -Education needs addressed   Intake/Output Summary (Last 24 hours) at 07/24/12 1356 Last data filed at 07/24/12 0517  Gross per 24 hour  Intake   1579 ml  Output   1400 ml  Net    179 ml    Last BM: 5/15  Labs:   Recent Labs Lab 07/23/12 1546  NA 138  K 3.7  CL 103  CO2 23  BUN 13  CREATININE 1.39*  CALCIUM 9.6  GLUCOSE 135*    CBG (last 3)  No results found for this basename: GLUCAP,  in the last 72 hours  Scheduled Meds: . [START ON 07/25/2012] azithromycin  1,200 mg Oral Q7 days  . dapsone  100 mg Oral Daily  . darunavir  600 mg Oral BID WC  . Dolutegravir Sodium  50 mg Oral BID  . lamiVUDine-zidovudine  1 tablet Oral BID  . megestrol  800 mg Oral Daily  . nystatin  5 mL Oral TID AC & HS  . pantoprazole  40 mg Oral Daily  . ritonavir  100 mg Oral BID WC  . tenofovir  300 mg Oral Daily  . valACYclovir  1,000 mg Oral Daily  . warfarin  5 mg Oral ONCE-1800  . Warfarin - Pharmacist Dosing Inpatient   Does not apply q1800    Continuous Infusions: . dextrose 5 % and 0.9% NaCl 100  mL/hr at 07/24/12 0849  . heparin 1,250 Units/hr (07/24/12 0030)    Past Medical History  Diagnosis Date  . HIV (human immunodeficiency virus infection)   . Hypertension   . Stroke   . Pancreatitis   . Mechanical heart valve present   . Arthritis   . Anemia   . GERD (gastroesophageal reflux disease)   . Abdominal pain   . Weight loss, unintentional   . Constipation   . Nausea & vomiting   . Diarrhea     Past Surgical History  Procedure Laterality Date  . Cardiac surgery    . Knee surgery    . Cholecystectomy  02/12/2012    Procedure: LAPAROSCOPIC CHOLECYSTECTOMY;  Surgeon: Almond Lint, MD;  Location: [facility] OR;  Service: General;  Laterality: N/A;    Loyce Dys, MS RD LDN Clinical Inpatient Dietitian Pager: [phone] Weekend/After hours pager: [phone]

## 2012-07-24 NOTE — Progress Notes (Signed)
UR COMPLETED  

## 2012-07-24 NOTE — Progress Notes (Signed)
ANTICOAGULATION CONSULT NOTE - follow up  Pharmacy Consult for Coumadin, heparin Indication: H/o AVR  Allergies  Allergen Reactions  . Bee Venom Anaphylaxis  . Sulfa Antibiotics Anaphylaxis  . Truvada (Emtricitabine-Tenofovir) Anaphylaxis    Takes plain tenofovir at home  . Lidoderm (Lidocaine) Other (See Comments)    Reaction unknown  . Raltegravir     resistance  . Sulfamethoxazole Itching  . Ceftriaxone Rash    Patient Measurements: Height: 5\' 9"  (175.3 cm) Weight: 115 lb 15.4 oz (52.6 kg) (Per 07/20/12 documentation) IBW/kg (Calculated) : 70.7 Heparin Dosing Weight: 52.6 kg  Vital Signs: Temp: 97.7 F (36.5 C) (05/16 0608) Temp src: Oral (05/16 0608) BP: 114/80 mmHg (05/16 0608) Pulse Rate: 60 (05/16 0608)  Labs:  Recent Labs  07/23/12 1546 07/23/12 2230 07/24/12 0633  HGB 11.9*  --   --   HCT 32.9*  --   --   PLT 169  --   --   LABPROT  --  19.9* 20.3*  INR  --  1.76* 1.81*  HEPARINUNFRC  --   --  0.41  CREATININE 1.39*  --   --     Estimated Creatinine Clearance: 50.5 ml/min (by C-G formula based on Cr of 1.39).   Assessment: 47 yo male with h/o AVR presented with persistent nausea and vomiting. Pharmacy to manage Coumadin and heparin. Home Coumadin regimen of alternating 2.5mg  and 5mg  with last dose 5/14 (5mg ). INR today is 1.81 which is below valve goal of 2.5 - 3.5.  He was given 5 mg of coumadin early this am at 0034 am.  Heparin level therapeutic at 0.41 on 1250 units/hr.  No bleeding reported.   During previous hospitalization, IV heparin infusion of 1450 units/hr (~ 27 units/kg/hr) produced at-goal heparin levels.  Goal of Therapy:  INR 2.5 - 3.5  Heparin level 0.3 - 0.7  Monitor platelets by anticoagulation protocol: Yes   Plan:  1. Repeat  Coumadin 5mg  po x 1 at 1800 ( got 5 mg at 0032 am) 2. Continue  Heparin IV infusion at 1250 units/hr (~ 24 units/kg/hr) 3. Daily CBC, heparin level, INR Herby Abraham, Pharm.D. [phone] 07/24/2012  8:14 AM

## 2012-07-24 NOTE — Progress Notes (Signed)
Received patient post endoscopy, alert and oriented, no change from previous assessment.

## 2012-07-24 NOTE — Op Note (Signed)
Eligha [facility]ideg[facility] [address] [place], [address]   OPERATIVE PROCEDURE REPORT  PATIENT: Brandon [name], Brandon [name]  MR#: [id number] BIRTHDATE: Mar 15, 1965  GENDER: Male ENDOSCOPIST: Jeani Hawking, MD ASSISTANT:   Beryle Beams, technician and Ennis Forts, RN PROCEDURE DATE: 07/24/2012 PROCEDURE:   EGD w/ biopsy ASA CLASS:   Class III INDICATIONS:Dysphagia. MEDICATIONS: Versed 6 mg IV and Fentanyl 75 mcg IV TOPICAL ANESTHETIC:   none  DESCRIPTION OF PROCEDURE:   After the risks benefits and alternatives of the procedure were thoroughly explained, informed consent was obtained.  The Pentax Gastroscope [id number]  endoscope was introduced through the mouth  and advanced to the second portion of the duodenum Without limitations.      The instrument was slowly withdrawn as the mucosa was fully examined.      FINDINGS: The esopahgus was normal.  No evidence of any overt inflammation, ulcerations, erosions, or vascular abnormalities. Random esophageal cold biopsies were obtained.  In the gastric lumen there was evidence of a medium-sized nonbleeding AVM.  No duodenal abnormalities noted.   Retroflexed views revealed no abnormalities.     The scope was then withdrawn from the patient and the procedure terminated.  COMPLICATIONS: There were no complications. IMPRESSION: 1) No gross esophageal abnormalities. 2) Medium-sized nonbleeding gastric  AVM.  RECOMMENDATIONS: 1) Await biopsy results. 2) If biopsies are negative a manometry will be required. 3) Lidocaine for his complaints of odynophagia.   _______________________________ eSignedJeani Hawking, MD 07/24/2012 2:49 PM

## 2012-07-25 DIAGNOSIS — F4323 Adjustment disorder with mixed anxiety and depressed mood: Secondary | ICD-10-CM

## 2012-07-25 DIAGNOSIS — R1310 Dysphagia, unspecified: Secondary | ICD-10-CM

## 2012-07-25 LAB — CBC
HCT: 30.1 % — ABNORMAL LOW (ref 39.0–52.0)
Hemoglobin: 10.7 g/dL — ABNORMAL LOW (ref 13.0–17.0)
MCHC: 35.5 g/dL (ref 30.0–36.0)
MCV: 96.8 fL (ref 78.0–100.0)
RDW: 19.4 % — ABNORMAL HIGH (ref 11.5–15.5)

## 2012-07-25 LAB — BASIC METABOLIC PANEL
BUN: 6 mg/dL (ref 6–23)
CO2: 26 mEq/L (ref 19–32)
Glucose, Bld: 114 mg/dL — ABNORMAL HIGH (ref 70–99)
Potassium: 3.7 mEq/L (ref 3.5–5.1)
Sodium: 142 mEq/L (ref 135–145)

## 2012-07-25 LAB — PROTIME-INR: INR: 2.98 — ABNORMAL HIGH (ref 0.00–1.49)

## 2012-07-25 MED ORDER — WARFARIN SODIUM 2.5 MG PO TABS
2.5000 mg | ORAL_TABLET | Freq: Once | ORAL | Status: DC
  Filled 2012-07-25: qty 1

## 2012-07-25 NOTE — Progress Notes (Signed)
[facility] PROGRESS NOTE  Brandon [name] ZOX:[id number] DOB: 05-24-1965 DOA: 07/23/2012 PCP: No primary provider on file.  Assessment/Plan: Intractable vomiting with odynophagia and abdominal pain -no further vomiting  -Appreciate Dr. Elnoria Howard  -EGD 07/24/2012--no gross esophageal lesions, medium-sized nonbleeding AVM  -Advanced diet to regular diet--patient was able to tolerate breakfast without vomiting -CT abdomen and pelvis negative for acute findings  -Lipase 29, LFTs normal  -Check urine drug screen  -UA without pyuria  -pt has wanted to leave AMA x 2-->spoke with pt at length x2-->willing to stay for further workup and wait for further GI/ID input -ask chaplain to see patient [phone]  -Continue antiretroviral medications  -Continue PCP prophylaxis and MAI prophylaxis  -Management per ID  Renal insufficiency  -Baseline serum creatinine has wide variations but appears to be 1.2-1.4  -Continue IV fluids until able to tolerate PO Aortic valve replacement history  -Continue warfarin  -PharmD assisting with dosing  -D/C heparin gtt Herpes genitalis  -Continue Valtrex  Family Communication:   Pt at beside Disposition Plan:   Home when medically stable      Procedures/Studies: Ct Abdomen Pelvis W Contrast  07/23/2012   *RADIOLOGY REPORT*  Clinical Data: Nausea, abdominal pain.  CT ABDOMEN AND PELVIS WITH CONTRAST  Technique:  Multidetector CT imaging of the abdomen and pelvis was performed following the standard protocol during bolus administration of intravenous contrast.  Contrast: 80mL OMNIPAQUE IOHEXOL 300 MG/ML  SOLN  Comparison: 04/12/2011  Findings: Lung bases are clear.  No effusions.  Heart is normal size.  Liver, spleen, stomach, pancreas, adrenals and kidneys are unremarkable.  No hydronephrosis.  No renal or ureteral stones. Prior cholecystectomy.  Trace free fluid in the pelvis.  Appendix is not definitively seen. No inflammatory process in the right lower  quadrant.  Large and small bowel grossly unremarkable.  Aorta is normal caliber. Scattered atherosclerotic calcifications.  No acute bony abnormality.  IMPRESSION: Trace free fluid in the pelvis.  No inflammatory process seen in the abdomen or pelvis.  Appendix not visualized, but no secondary signs of appendicitis.   Original Report Authenticated By: Charlett Nose, M.D.         Subjective: Patient is still having intermittent dysphasia and odynophagia. He denies any chest discomfort, shortness of breath, nausea, vomiting, diarrhea. He has his chronic abdominal pain which is unchanged. No dysuria no hematuria.  Objective: Filed Vitals:   07/24/12 1510 07/24/12 1541 07/24/12 2120 07/25/12 0555  BP: 124/80 159/90 159/90 116/81  Pulse:   72 67  Temp:  98.1 F (36.7 C) 97.9 F (36.6 C) 98.4 F (36.9 C)  TempSrc:  Oral Oral Oral  Resp: 11 16 15 17   Height:      Weight:      SpO2: 100% 96% 100% 99%    Intake/Output Summary (Last 24 hours) at 07/25/12 1007 Last data filed at 07/25/12 8119  Gross per 24 hour  Intake 2934.38 ml  Output   1600 ml  Net 1334.38 ml   Weight change:  Exam:   General:  Pt is alert, follows commands appropriately, not in acute distress  HEENT: No icterus, No thrush, [place]/AT  Cardiovascular: RRR, S1/S2, no rubs, no gallops  Respiratory: CTA bilaterally, no wheezing, no crackles, no rhonchi  Abdomen: Soft/+BS, epigastric and left upper quadrant tenderness without any peritoneal signs, non distended, no guarding  Extremities: No edema, No lymphangitis, No petechiae, No rashes, no synovitis  Data Reviewed: Basic Metabolic Panel:  Recent Labs Lab 07/23/12 1546 07/25/12  0540  NA 138 142  K 3.7 3.7  CL 103 110  CO2 23 26  GLUCOSE 135* 114*  BUN 13 6  CREATININE 1.39* 1.31  CALCIUM 9.6 8.9  MG  --  1.7   Liver Function Tests:  Recent Labs Lab 07/23/12 1546  AST 21  ALT 12  ALKPHOS 74  BILITOT 0.5  PROT 7.7  ALBUMIN 4.2    Recent  Labs Lab 07/23/12 1546  LIPASE 29   No results found for this basename: AMMONIA,  in the last 168 hours CBC:  Recent Labs Lab 07/23/12 1546 07/25/12 0540  WBC 4.7 4.3  NEUTROABS 3.7  --   HGB 11.9* 10.7*  HCT 32.9* 30.1*  MCV 96.5 96.8  PLT 169 161   Cardiac Enzymes: No results found for this basename: CKTOTAL, CKMB, CKMBINDEX, TROPONINI,  in the last 168 hours BNP: No components found with this basename: POCBNP,  CBG: No results found for this basename: GLUCAP,  in the last 168 hours  No results found for this or any previous visit (from the past 240 hour(s)).   Scheduled Meds: . azithromycin  1,200 mg Oral Q7 days  . dapsone  100 mg Oral Daily  . darunavir  600 mg Oral BID WC  . Dolutegravir Sodium  50 mg Oral BID  . feeding supplement  237 mL Oral BID BM  . feeding supplement  1 Container Oral TID WC  . lamiVUDine-zidovudine  1 tablet Oral BID  . megestrol  800 mg Oral Daily  . nystatin  5 mL Oral TID AC & HS  . pantoprazole  40 mg Oral Daily  . ritonavir  100 mg Oral BID WC  . tenofovir  300 mg Oral Daily  . valACYclovir  1,000 mg Oral Daily  . warfarin  2.5 mg Oral ONCE-1800  . Warfarin - Pharmacist Dosing Inpatient   Does not apply q1800   Continuous Infusions: . dextrose 5 % and 0.9% NaCl 100 mL/hr at 07/25/12 0722     Phoebe Marter, DO  [facility] Pager [phone]  If 7PM-7AM, please contact night-coverage [email or web address] Password [email or web address] 07/25/2012, 10:07 AM   LOS: 2 days

## 2012-07-25 NOTE — Progress Notes (Signed)
    [facility]  Date of Admission:  07/23/2012  Antibiotics: Prezista/r/Tivicay/Combivir/Viread Valacyclovir Dapsone Azithromycin weekly  Subjective: Abdominal pain is at baseline, no further n/v EGD yesterday and no significant findings, biopsy pending  Objective: Temp:  [97.9 F (36.6 C)-98.4 F (36.9 C)] 98.4 F (36.9 C) (05/17 0555) Pulse Rate:  [67-72] 67 (05/17 0555) Resp:  [11-20] 17 (05/17 0555) BP: (116-159)/(71-90) 116/81 mmHg (05/17 0555) SpO2:  [96 %-100 %] 99 % (05/17 0555)  General: AAO x 3, nad Skin: no rash Lungs: CTA B Cor: RRR without m/r/g Abdomen: soft, nt, nd   Lab Results Lab Results  Component Value Date   WBC 4.3 07/25/2012   HGB 10.7* 07/25/2012   HCT 30.1* 07/25/2012   MCV 96.8 07/25/2012   PLT 161 07/25/2012    Lab Results  Component Value Date   CREATININE 1.31 07/25/2012   BUN 6 07/25/2012   NA 142 07/25/2012   K 3.7 07/25/2012   CL 110 07/25/2012   CO2 26 07/25/2012    Lab Results  Component Value Date   ALT 12 07/23/2012   AST 21 07/23/2012   ALKPHOS 74 07/23/2012   BILITOT 0.5 07/23/2012      Microbiology: No results found for this or any previous visit (from the past 240 hour(s)).  Studies/Results: Ct Abdomen Pelvis W Contrast  07/23/2012   *RADIOLOGY REPORT*  Clinical Data: Nausea, abdominal pain.  CT ABDOMEN AND PELVIS WITH CONTRAST  Technique:  Multidetector CT imaging of the abdomen and pelvis was performed following the standard protocol during bolus administration of intravenous contrast.  Contrast: 80mL OMNIPAQUE IOHEXOL 300 MG/ML  SOLN  Comparison: 04/12/2011  Findings: Lung bases are clear.  No effusions.  Heart is normal size.  Liver, spleen, stomach, pancreas, adrenals and kidneys are unremarkable.  No hydronephrosis.  No renal or ureteral stones. Prior cholecystectomy.  Trace free fluid in the pelvis.  Appendix is not definitively seen. No inflammatory process in the right lower quadrant.  Large and  small bowel grossly unremarkable.  Aorta is normal caliber. Scattered atherosclerotic calcifications.  No acute bony abnormality.  IMPRESSION: Trace free fluid in the pelvis.  No inflammatory process seen in the abdomen or pelvis.  Appendix not visualized, but no secondary signs of appendicitis.   Original Report Authenticated By: Charlett Nose, M.D.    Assessment/Plan: 1) odynophagia/dysphagia - EGD does not suggest anything, to get manometry if nothing significant.    2) HIV/AIDS - on appropriate ARVs and tolerating, no further vomiting.    Staci Righter, MD [facility] for Infectious Disease [facility] [phone] pager   07/25/2012, 11:11 AM

## 2012-07-25 NOTE — Discharge Summary (Signed)
Physician Discharge Summary  Brandon [name]:[id number] DOB: 1966/01/28 DOA: 07/23/2012  PCP: No primary provider on file.  Admit date: 07/23/2012 Discharge date: 07/25/2012  Recommendations for Outpatient Follow-up:  1. Pt will need to follow up with PCP in 2 weeks post discharge 2. Please obtain BMP to evaluate electrolytes and kidney function 3. Please also check CBC to evaluate Hg and Hct levels 4. Follow up with Dr. Elnoria Howard in 1-2 weeks 5. Follow up with dr. Daiva Eves in 1-2 weeks   Discharge Diagnoses:  Active Problems:   Abdominal pain   S/P AVR (aortic valve replacement)   AIDS   Adjustment disorder with mixed anxiety and depressed mood   Genital herpes   Long term (current) use of anticoagulants   Candida esophagitis   Intractable nausea and vomiting   Odynophagia   Persistent vomiting Intractable vomiting with odynophagia and abdominal pain  -no further vomiting  -Appreciate Dr. Elnoria Howard  -EGD 07/24/2012--no gross esophageal lesions, medium-sized nonbleeding AVM  -Advanced diet to regular diet--patient was able to tolerate breakfast without vomiting  -CT abdomen and pelvis negative for acute findings  -Lipase 29, LFTs normal  -Check urine drug screen  -UA without pyuria  -pt has wanted to leave AMA x 2-->spoke with pt at length x2-->willing to stay for further workup and wait for further GI/ID input  -ask chaplain to see patient  -Unfortunately, the patient on a foreign object in his lunch and after this event, the patient no longer wanted to stay in the hospital. -The patient was able to tolerate cardiac dysfunction as well as his breakfast without further vomiting -He was able to take liquids without vomiting -Dr. Luciana Axe was notified of pt's d/c -The patient's wife was updated at the bedside. I instructed the patient and his wife to contact Dr. Elnoria Howard for outpt followup 042  -Continue antiretroviral medications  -Continue PCP prophylaxis and MAI prophylaxis   -Management per ID  Renal insufficiency  -Baseline serum creatinine has wide variations but appears to be 1.2-1.4  -Continued IV fluids until able to tolerate PO  Aortic valve replacement history  -Continue warfarin--INR was therapeutic on day of d/c--2.98 -I instructed the patient to continue his usual dose of warfarin home and to follow up with his primary care physician for INR check in 48 hours, on Monday, 07/27/2012 -PharmD assisting with dosing  -D/C heparin gtt  Herpes genitalis  -Continue Valtrex  Family Communication: Pt at beside  Disposition Plan: Home when medically stable   Discharge Condition: stable  Disposition:  Follow-up Information   Follow up with HUNG,PATRICK D, MD In 1 week.   Contact information:   [address] [place]ia Paling [place] [address] [phone]       Follow up with Acey Lav, MD In 2 weeks.   Contact information:   [address] [place] [address] [phone]       Diet:regular Wt Readings from Last 3 Encounters:  07/23/12 52.6 kg (115 lb 15.4 oz)  07/23/12 52.6 kg (115 lb 15.4 oz)  07/20/12 52.617 kg (116 lb)    History of present illness:  47 y.o. male with HIV/AIDS with recent CD4 of 50, which was up from 20 and undetectable viral load. He previously was at [facility] and due to highly resistant virus and poor compliance was sent to hospice and off of ARVs. He saw Dr. Daiva Eves in March of 2014 as a new patient and had multiple problems including odynophagia, persistent  genital herpes, and chronic abdominal pain. He was started on high dose fluconazole and has been taking, but has had no improvement in his symptoms. He was getting referred to GI but presented to clinic with severe abdominal pain and sent to ED with concern for pancreatitis. His lipase was WNL and he no longer has severe abdominal pain. Continues to have difficulty swallowing liquids and solids with a "stuck" feeling with both.   Patient states that he finish 14 day course of fluconazole and continues to have dysphagia and odynophagia type symptoms. However, the patient states that he has had these types of symptoms for the past 14 years. He does not recall if he ever had an EGD. He was followed by gastroenterology at [facility] whom he states that a colonoscopy about 5 years ago. He was reportedly negative. The patient complains of chronic epigastric abdominal pain for the past 14 years. He states it has improved since his cholecystectomy on 02/12/2012. Denies any hematemesis, hematochezia, melena, fevers, chills, chest pain, shortness of breath     Consultants: ID, Dr. Luciana Axe GI, Dr. Elnoria Howard  Discharge Exam: Filed Vitals:   07/25/12 0555  BP: 116/81  Pulse: 67  Temp: 98.4 F (36.9 C)  Resp: 17   Filed Vitals:   07/24/12 1510 07/24/12 1541 07/24/12 2120 07/25/12 0555  BP: 124/80 159/90 159/90 116/81  Pulse:   72 67  Temp:  98.1 F (36.7 C) 97.9 F (36.6 C) 98.4 F (36.9 C)  TempSrc:  Oral Oral Oral  Resp: 11 16 15 17   Height:      Weight:      SpO2: 100% 96% 100% 99%   General: A&O x 3, NAD, pleasant, cooperative Cardiovascular: RRR, no rub, no gallop, no S3 Respiratory: CTAB, no wheeze, no rhonchi Abdomen:soft, mild epigastric and left upper quadrant pain without any guarding or peritoneal sign, nondistended, positive bowel sounds Extremities: No edema, No lymphangitis, no petechiae  Discharge Instructions      Discharge Orders   Future Appointments Provider Department Dept Phone   08/19/2012 2:00 PM [facility] for Infectious Disease [phone]   08/26/2012 11:15 AM Randall Hiss, MD [facility] for Infectious Disease [phone]   Future Orders Complete By Expires     Diet - low sodium heart healthy  As directed     Increase activity slowly  As directed         Medication List    TAKE these medications       azithromycin 600 MG tablet   Commonly known as:  ZITHROMAX  Take 1,200 mg by mouth every 7 (seven) days. On Saturday     Clobetasol Propionate 0.05 % lotion  Apply 1 application topically 2 (two) times daily as needed (for rash).     dapsone 100 MG tablet  Take 1 tablet (100 mg total) by mouth daily.     darunavir 600 MG tablet  Commonly known as:  PREZISTA  Take 1 tablet (600 mg total) by mouth 2 (two) times daily with a meal.     dicyclomine 20 MG tablet  Commonly known as:  BENTYL  Take 20 mg by mouth 3 (three) times daily.     fluconazole 100 MG tablet  Commonly known as:  DIFLUCAN  Take 1 tablet (100 mg total) by mouth daily.     lamiVUDine-zidovudine 150-300 MG per tablet  Commonly known as:  COMBIVIR  Take 1 tablet by mouth 2 (two) times daily.  LORazepam 1 MG tablet  Commonly known as:  ATIVAN  Take 1 tablet (1 mg total) by mouth every 8 (eight) hours as needed for anxiety (or muscle spasms).     megestrol 400 MG/10ML suspension  Commonly known as:  MEGACE  Take 20 mLs (800 mg total) by mouth daily.     morphine 15 MG tablet  Commonly known as:  MSIR  Take 1 tablet (15 mg total) by mouth 2 (two) times daily as needed for pain.     morphine 15 MG 12 hr tablet  Commonly known as:  MS CONTIN  Take 1 tablet (15 mg total) by mouth 2 (two) times daily.     ondansetron 8 MG disintegrating tablet  Commonly known as:  ZOFRAN ODT  Take 1 tablet (8 mg total) by mouth every 8 (eight) hours as needed for nausea.     pantoprazole 40 MG tablet  Commonly known as:  PROTONIX  Take 1 tablet (40 mg total) by mouth daily.     ritonavir 100 MG Tabs  Commonly known as:  NORVIR  Take 1 tablet (100 mg total) by mouth daily.     tenofovir 300 MG tablet  Commonly known as:  VIREAD  Take 1 tablet (300 mg total) by mouth daily.     TIVICAY 50 MG Tabs  Generic drug:  Dolutegravir Sodium  Take 50 mg by mouth 2 (two) times daily.     valACYclovir 1000 MG tablet  Commonly known as:  VALTREX  Take 1  tablet (1,000 mg total) by mouth daily.     warfarin 5 MG tablet  Commonly known as:  COUMADIN  Take 2.5-5 mg by mouth daily. Alternating days     zolpidem 10 MG tablet  Commonly known as:  AMBIEN  Take 1 tablet (10 mg total) by mouth at bedtime as needed for sleep. Take 1 tablet (10 mg total) by mouth nightly as needed for Sleep. May repeat times one as needed         The results of significant diagnostics from this hospitalization (including imaging, microbiology, ancillary and laboratory) are listed below for reference.    Significant Diagnostic Studies: Ct Abdomen Pelvis W Contrast  07/23/2012   *RADIOLOGY REPORT*  Clinical Data: Nausea, abdominal pain.  CT ABDOMEN AND PELVIS WITH CONTRAST  Technique:  Multidetector CT imaging of the abdomen and pelvis was performed following the standard protocol during bolus administration of intravenous contrast.  Contrast: 80mL OMNIPAQUE IOHEXOL 300 MG/ML  SOLN  Comparison: 04/12/2011  Findings: Lung bases are clear.  No effusions.  Heart is normal size.  Liver, spleen, stomach, pancreas, adrenals and kidneys are unremarkable.  No hydronephrosis.  No renal or ureteral stones. Prior cholecystectomy.  Trace free fluid in the pelvis.  Appendix is not definitively seen. No inflammatory process in the right lower quadrant.  Large and small bowel grossly unremarkable.  Aorta is normal caliber. Scattered atherosclerotic calcifications.  No acute bony abnormality.  IMPRESSION: Trace free fluid in the pelvis.  No inflammatory process seen in the abdomen or pelvis.  Appendix not visualized, but no secondary signs of appendicitis.   Original Report Authenticated By: Charlett Nose, M.D.     Microbiology: No results found for this or any previous visit (from the past 240 hour(s)).   Labs: Basic Metabolic Panel:  Recent Labs Lab 07/23/12 1546 07/25/12 0540  NA 138 142  K 3.7 3.7  CL 103 110  CO2 23 26  GLUCOSE 135* 114*  BUN  13 6  CREATININE 1.39* 1.31   CALCIUM 9.6 8.9  MG  --  1.7   Liver Function Tests:  Recent Labs Lab 07/23/12 1546  AST 21  ALT 12  ALKPHOS 74  BILITOT 0.5  PROT 7.7  ALBUMIN 4.2    Recent Labs Lab 07/23/12 1546  LIPASE 29   No results found for this basename: AMMONIA,  in the last 168 hours CBC:  Recent Labs Lab 07/23/12 1546 07/25/12 0540  WBC 4.7 4.3  NEUTROABS 3.7  --   HGB 11.9* 10.7*  HCT 32.9* 30.1*  MCV 96.5 96.8  PLT 169 161   Cardiac Enzymes: No results found for this basename: CKTOTAL, CKMB, CKMBINDEX, TROPONINI,  in the last 168 hours BNP: No components found with this basename: POCBNP,  CBG: No results found for this basename: GLUCAP,  in the last 168 hours  Time coordinating discharge:  Greater than 30 minutes  Signed:  Jamaiyah Pyle, DO [facility] Pager: [phone] 07/25/2012, 4:04 PM

## 2012-07-25 NOTE — Progress Notes (Addendum)
ANTICOAGULATION CONSULT NOTE - follow up  Pharmacy Consult for Coumadin, heparin Indication: H/o AVR  Allergies  Allergen Reactions  . Bee Venom Anaphylaxis  . Sulfa Antibiotics Anaphylaxis  . Truvada (Emtricitabine-Tenofovir) Anaphylaxis    Takes plain tenofovir at home  . Lidoderm (Lidocaine) Other (See Comments)    Reaction unknown  . Raltegravir     resistance  . Sulfamethoxazole Itching  . Ceftriaxone Rash    Patient Measurements: Height: 5\' 9"  (175.3 cm) Weight: 115 lb 15.4 oz (52.6 kg) (Per 07/20/12 documentation) IBW/kg (Calculated) : 70.7 Heparin Dosing Weight: 52.6 kg  Vital Signs: Temp: 98.4 F (36.9 C) (05/17 0555) Temp src: Oral (05/17 0555) BP: 116/81 mmHg (05/17 0555) Pulse Rate: 67 (05/17 0555)  Labs:  Recent Labs  07/23/12 1546 07/23/12 2230 07/24/12 0633 07/25/12 0540  HGB 11.9*  --   --  10.7*  HCT 32.9*  --   --  30.1*  PLT 169  --   --  161  LABPROT  --  19.9* 20.3* 29.4*  INR  --  1.76* 1.81* 2.98*  HEPARINUNFRC  --   --  0.41 0.60  CREATININE 1.39*  --   --  1.31    Estimated Creatinine Clearance: 53.5 ml/min (by C-G formula based on Cr of 1.31).   Assessment: 47 yo male with h/o AVR on chronic Coumadin PTA but INR on admit subtherapeutic so started on heparin bridge.  Home Coumadin: alternating 2.5mg  and 5mg  with last dose 5/14 (5mg ). INR on admit 1.81 which is below valve goal of 2.5 - 3.5.    Today: Heparin level at goal; INR at goal today -- with larger increase overnight.  No bleeding reported.  Spoke with Dr Arbutus Leas- he will d/c heparin with INR at goal.  Goal of Therapy:  INR 2.5 - 3.5  Heparin level 0.3 - 0.7  Monitor platelets by anticoagulation protocol: Yes   Plan:  - Coumadin 2.5 mg po x 1 at 1800 - Continue Heparin IV infusion at 1250 units/hr (~ 24 units/kg/hr) - Daily CBC, heparin level, INR  Kenyata Guess L. Illene Bolus, PharmD, BCPS Clinical Pharmacist Pager: [phone] Pharmacy: [phone] 07/25/2012 9:35 AM

## 2012-07-27 ENCOUNTER — Encounter (HOSPITAL_COMMUNITY): Payer: Self-pay | Admitting: Gastroenterology

## 2012-07-29 ENCOUNTER — Ambulatory Visit (INDEPENDENT_AMBULATORY_CARE_PROVIDER_SITE_OTHER): Payer: Medicaid Other | Admitting: Infectious Disease

## 2012-07-29 ENCOUNTER — Encounter: Payer: Self-pay | Admitting: Infectious Disease

## 2012-07-29 VITALS — BP 158/99 | HR 90 | Temp 99.3°F | Wt 121.0 lb

## 2012-07-29 DIAGNOSIS — R5381 Other malaise: Secondary | ICD-10-CM

## 2012-07-29 DIAGNOSIS — R1310 Dysphagia, unspecified: Secondary | ICD-10-CM

## 2012-07-29 DIAGNOSIS — R112 Nausea with vomiting, unspecified: Secondary | ICD-10-CM

## 2012-07-29 DIAGNOSIS — H538 Other visual disturbances: Secondary | ICD-10-CM

## 2012-07-29 DIAGNOSIS — M545 Low back pain: Secondary | ICD-10-CM

## 2012-07-29 DIAGNOSIS — R531 Weakness: Secondary | ICD-10-CM

## 2012-07-29 DIAGNOSIS — B20 Human immunodeficiency virus [HIV] disease: Secondary | ICD-10-CM

## 2012-07-29 NOTE — Progress Notes (Signed)
Subjective:    Patient ID: Brandon [name], male    DOB: 05-02-1965, 47 y.o.   MRN: [id number]  HPI  Brandon [name] is a highly complicated man with HIV/AIDS and Multi-DRUG RESISTANT virus formerly followed at [facility]. Apparently he had been taken off ARV and placed with hospice.  He has been on various complicated antiretroviral regimens in the past, with unfortunate GENOTYPIC resistance to all non-nucleoside reverse transcriptase inhibitors and all NRTIs, Resistance to all protease inhibitors with the exception of Prezista which had some activity genotypically,, Resistance to Isentress, and Elvitegravir and 100X reduced S to dolutegravir having both a 148H and 140S  and with Dual tropic virus.  He had most recently been on a regimen of Twice daily Prezista, Norvir, twice daily AZT, once daily Viread, with these meds stopped n 02/2012. Prior to this had been on similar regimen but with isentress and maraviroc (despite IN R and dual mixed virus. At that time Dolutegravir was not available)  02/12/2010 phenotype at [facility] showed:  RT: NRTI: ABC, DDI, D4T, AZT, TDF: resistant; 3TC, FTC: susceptible; NNRTI: EFV susceptible; RPV, NVP, ETR, DLV: Resistant; PI: pan-resistant  He had decided now to go back onto ARVS and is referred to [facility].  We  Have seen him and placed him on a  salvage regimen of Prezista 600mg   Twice daily boosted with Norvir 100mg  twice daily, Tivicay twice daily, Combivir twice daily and once daily Viread.  And since then he has now an UNDETECTABLE VIRAL LOAD <20. CD4 has also risen from 20 to 50  Last week he came into the clinic a second time with severe abdominal pain doubled over with nausea and vomiting. He was admitted to the emergency department and to the hospital. He underwent [place] was CT abdomen pelvis which was normal. Brandon [name] was consulted and performed an EGD. An EGD the patient's esophagus was grossly normal without any ulcerations. Biopsies were  performed and these did not show any evidence of infection including H. pylori. 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 He is going to followup with Brandon [name].  Today the patient presents after having been in the hospital for followup. He feels relatively well though he is having some diffuse myalgias in his arms and neck and having lower back pain. He also is having some subjective fevers and measured temperature of 9.9 here in the clinic. His abdominal pain seems improved as does his dysphasia.  He has had prior imaging done at [facility] in November twice with MRI of the spine where an area of T2 hyperintensity was thought to likely represent degenerative disease rather than discitis to the fact that was stable on imaging beginning of November 2 the end of November. However I think repeating an MRI is warranted. He did have prior sepsis with infected knee prior to helping this back pain.  Finally Brandon [name] is having problems with blurry vision at times although it comes on and off last night he noticed it while watching a basketball game. He has had no prior history of CMV retinitis and currently has no problems with his visual field on confrontation.   I filled out his disability forms.    Review of Systems  Constitutional: Positive for fatigue and unexpected weight change. Negative for fever, chills, diaphoresis, activity change and appetite change.  HENT: Positive for trouble swallowing. Negative for congestion, sore throat, rhinorrhea, sneezing and sinus pressure.   Eyes: Positive  for visual disturbance. Negative for photophobia.  Respiratory: Negative for cough, chest tightness, shortness of breath, wheezing and stridor.   Cardiovascular: Negative for chest pain, palpitations and leg swelling.  Gastrointestinal: Positive for nausea and vomiting. Negative for diarrhea, constipation, blood in stool, abdominal distention and anal bleeding.    Genitourinary: Negative for dysuria, hematuria, flank pain and difficulty urinating.  Musculoskeletal: Positive for back pain and arthralgias. Negative for myalgias, joint swelling and gait problem.  Skin: Positive for rash. Negative for color change and wound.  Neurological: Negative for dizziness, tremors, weakness and light-headedness.  Hematological: Negative for adenopathy. Does not bruise/bleed easily.  Psychiatric/Behavioral: Negative for behavioral problems, confusion, sleep disturbance, dysphoric mood, decreased concentration and agitation.       Objective:   Physical Exam  Constitutional: He is oriented to person, place, and time. No distress.  HENT:  Head: Normocephalic and atraumatic.  Mouth/Throat: Posterior oropharyngeal edema present. No oropharyngeal exudate.  Eyes: Conjunctivae and EOM are normal. Pupils are equal, round, and reactive to light. Right eye exhibits no discharge and no exudate. Left eye exhibits no discharge and no exudate. Right conjunctiva is not injected. Right conjunctiva has no hemorrhage. Left conjunctiva is not injected. Left conjunctiva has no hemorrhage.  Neck: Normal range of motion. Neck supple.  Cardiovascular: Regular rhythm and normal heart sounds.  Exam reveals no gallop and no friction rub.   No murmur heard. Pulmonary/Chest: Effort normal and breath sounds normal. No respiratory distress. He has no wheezes. He has no rales.  Abdominal: He exhibits no distension. There is tenderness. There is no rebound.  Musculoskeletal: He exhibits no edema and no tenderness.       Arms: Lymphadenopathy:    He has cervical adenopathy.  Neurological: He is alert and oriented to person, place, and time. He exhibits normal muscle tone. Coordination normal.  Skin: Skin is warm and dry. No erythema. No pallor.  Psychiatric: His behavior is normal. Judgment and thought content normal. His mood appears anxious. He exhibits a depressed mood.           Assessment & Plan:   #1 HIV: Highly Resistant Virus.   See above discussion Prezista with activity by Genotype but not by phenotype.  Tivicay with activity but with 148 and 140 mutations 100 fold less active. He does NOT have any NRTI or NNRTI activity by genotype. Phenotype only showed activity of 3tc/emtricitabine. sustiva by phenotype. 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  Feeling of malaise and weakness and fatigue: We'll check a serum cortisol level as well as a virus and metformin. He has not been on megestrol although only for short-term in his symptoms that preceded this.  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 We will have her check electrolytes again and check CPK and LDH.  Blurry vision: Appears episodic but given his immune compromise state that he deserves formal evaluation by ophthalmologist with a dilated funduscopic exam to ensure there is no evidence of CMV or HSV infection in the eye. Certainly could also be at risk for immune reconstitution to such an infection.  Lower back pain: Given persistence  of pain and prior sepsis with infected knee I think reimaging his back with a repeat MRI would be helpful many of the film somewhat for some for for comparison to those here done in [place]. [employer] if they can be transmitted electronically.   Disability: due to his profound weakness, LBP and pain at his prior septic knee site he cannot walkk 200 feet withotu rest and I documented this in his application

## 2012-07-29 NOTE — Patient Instructions (Addendum)
I would like you seen by an OPthalmologist to evaluate your eyes with fundoscopic exam  We will also arrange for MRI of your lumbar spine  We will get bloodwork today and in June prior to your scheduled followup appt

## 2012-07-30 LAB — CBC WITH DIFFERENTIAL/PLATELET
Basophils Absolute: 0 10*3/uL (ref 0.0–0.1)
Eosinophils Absolute: 0.1 10*3/uL (ref 0.0–0.7)
Eosinophils Relative: 1 % (ref 0–5)
Lymphs Abs: 2.5 10*3/uL (ref 0.7–4.0)
MCH: 34.6 pg — ABNORMAL HIGH (ref 26.0–34.0)
MCV: 99.4 fL (ref 78.0–100.0)
Neutrophils Relative %: 30 % — ABNORMAL LOW (ref 43–77)
Platelets: 176 10*3/uL (ref 150–400)
RBC: 3.21 MIL/uL — ABNORMAL LOW (ref 4.22–5.81)
RDW: 21.1 % — ABNORMAL HIGH (ref 11.5–15.5)
WBC: 4.3 10*3/uL (ref 4.0–10.5)

## 2012-07-30 LAB — COMPLETE METABOLIC PANEL WITH GFR
ALT: 10 U/L (ref 0–53)
Albumin: 4 g/dL (ref 3.5–5.2)
BUN: 11 mg/dL (ref 6–23)
CO2: 26 mEq/L (ref 19–32)
Calcium: 9.3 mg/dL (ref 8.4–10.5)
Chloride: 106 mEq/L (ref 96–112)
Creat: 1.12 mg/dL (ref 0.50–1.35)
GFR, Est African American: >89 mL/min
Potassium: 3.6 mEq/L (ref 3.5–5.3)

## 2012-07-30 LAB — HIV-1 RNA ULTRAQUANT REFLEX TO GENTYP+
HIV 1 RNA Quant: <20 copies/mL (ref <20)
HIV-1 RNA Quant, Log: <1.3 {Log} (ref <1.30)

## 2012-07-30 LAB — CK: Total CK: 111 U/L (ref 7–232)

## 2012-07-30 LAB — TSH: TSH: 1.882 u[IU]/mL (ref 0.350–4.500)

## 2012-08-14 ENCOUNTER — Telehealth: Payer: Self-pay | Admitting: Licensed Clinical Social Worker

## 2012-08-14 ENCOUNTER — Encounter: Payer: Self-pay | Admitting: Infectious Disease

## 2012-08-14 NOTE — Telephone Encounter (Signed)
I want to hospitalize him and control his nausea if it does not come under control Is he keeping ALL of meds down? When is he coming in next?

## 2012-08-14 NOTE — Telephone Encounter (Signed)
Patient called complaining of nausea and vomiting every other day since Monday of this week, he called originally to confirm appointment but wanted me to let Dr. Daiva Eves know about his symptoms.

## 2012-08-14 NOTE — Telephone Encounter (Signed)
08/19/12 for labs and 08/26/12 to see you

## 2012-08-18 ENCOUNTER — Other Ambulatory Visit: Payer: Self-pay | Admitting: Infectious Disease

## 2012-08-19 ENCOUNTER — Telehealth: Payer: Self-pay

## 2012-08-19 ENCOUNTER — Other Ambulatory Visit (INDEPENDENT_AMBULATORY_CARE_PROVIDER_SITE_OTHER): Payer: Medicaid Other

## 2012-08-19 DIAGNOSIS — B20 Human immunodeficiency virus [HIV] disease: Secondary | ICD-10-CM

## 2012-08-19 LAB — CBC WITH DIFFERENTIAL/PLATELET
Eosinophils Absolute: 0.1 10*3/uL (ref 0.0–0.7)
Eosinophils Relative: 2 % (ref 0–5)
HCT: 33.1 % — ABNORMAL LOW (ref 39.0–52.0)
Hemoglobin: 11.1 g/dL — ABNORMAL LOW (ref 13.0–17.0)
Lymphocytes Relative: 52 % — ABNORMAL HIGH (ref 12–46)
Lymphs Abs: 2.5 10*3/uL (ref 0.7–4.0)
MCH: 36.3 pg — ABNORMAL HIGH (ref 26.0–34.0)
MCV: 108.2 fL — ABNORMAL HIGH (ref 78.0–100.0)
Monocytes Absolute: 0.3 10*3/uL (ref 0.1–1.0)
Monocytes Relative: 7 % (ref 3–12)
RBC: 3.06 MIL/uL — ABNORMAL LOW (ref 4.22–5.81)
WBC: 4.7 10*3/uL (ref 4.0–10.5)

## 2012-08-19 NOTE — Telephone Encounter (Signed)
Pt states he is having problems with prior authorization each month with the current Morphine 15 ER scripts.  He would like to change to IR which does not require authorization. Pt informed we would need to speak with Dr Daiva Eves who is not here today.  In the meantime we will see if we can get his current dose preauthorized.    Laurell Josephs, RN

## 2012-08-20 ENCOUNTER — Other Ambulatory Visit: Payer: Self-pay | Admitting: Licensed Clinical Social Worker

## 2012-08-20 DIAGNOSIS — Z889 Allergy status to unspecified drugs, medicaments and biological substances status: Secondary | ICD-10-CM

## 2012-08-20 LAB — COMPLETE METABOLIC PANEL WITH GFR
AST: 15 U/L (ref 0–37)
Alkaline Phosphatase: 65 U/L (ref 39–117)
BUN: 10 mg/dL (ref 6–23)
Creat: 1.25 mg/dL (ref 0.50–1.35)
Potassium: 4 mEq/L (ref 3.5–5.3)
Total Bilirubin: 0.4 mg/dL (ref 0.3–1.2)

## 2012-08-20 LAB — T-HELPER CELL (CD4) - (RCID CLINIC ONLY)
CD4 % Helper T Cell: 5 % — ABNORMAL LOW (ref 33–55)
CD4 T Cell Abs: 140 uL — ABNORMAL LOW (ref 400–2700)

## 2012-08-20 LAB — HIV-1 RNA QUANT-NO REFLEX-BLD: HIV 1 RNA Quant: <20 copies/mL (ref <20)

## 2012-08-20 MED ORDER — EPINEPHRINE 0.3 MG/0.3ML IJ SOAJ: 0.3000 mg | Freq: Once | INTRAMUSCULAR | Status: AC

## 2012-08-26 ENCOUNTER — Encounter: Payer: Self-pay | Admitting: Infectious Disease

## 2012-08-26 ENCOUNTER — Ambulatory Visit (INDEPENDENT_AMBULATORY_CARE_PROVIDER_SITE_OTHER): Payer: Medicaid Other | Admitting: Infectious Disease

## 2012-08-26 ENCOUNTER — Ambulatory Visit: Payer: Medicaid Other | Admitting: Infectious Disease

## 2012-08-26 VITALS — BP 135/87 | HR 96 | Temp 98.7°F | Wt 119.0 lb

## 2012-08-26 DIAGNOSIS — M545 Low back pain: Secondary | ICD-10-CM

## 2012-08-26 DIAGNOSIS — R112 Nausea with vomiting, unspecified: Secondary | ICD-10-CM

## 2012-08-26 DIAGNOSIS — B20 Human immunodeficiency virus [HIV] disease: Secondary | ICD-10-CM

## 2012-08-26 NOTE — Progress Notes (Signed)
Subjective:    Patient ID: Brandon [name], male    DOB: [birth year], 47 y.o.   MRN: [id number]  HPI   Mr Afzal is a highly complicated man with HIV/AIDS and Multi-DRUG RESISTANT virus formerly followed at [facility]. Apparently he had been taken off ARV and placed with hospice.  He has been on various complicated antiretroviral regimens in the past, with unfortunate GENOTYPIC resistance to all non-nucleoside reverse transcriptase inhibitors and all NRTIs, Resistance to all protease inhibitors with the exception of Prezista which had some activity genotypically,, Resistance to Isentress, and Elvitegravir and 100X reduced S to dolutegravir having both a 148H and 140S  and with Dual tropic virus.  He had most recently been on a regimen of Twice daily Prezista, Norvir, twice daily AZT, once daily Viread, with these meds stopped n 02/2012. Prior to this had been on similar regimen but with isentress and maraviroc (despite IN R and dual mixed virus. At that time Dolutegravir was not available)  02/12/2010 phenotype at [facility] showed:  RT: NRTI: ABC, DDI, D4T, AZT, TDF: resistant; 3TC, FTC: susceptible; NNRTI: EFV susceptible; RPV, NVP, ETR, DLV: Resistant; PI: pan-resistant  He had decided now to go back onto ARVS and is referred to [facility].  We  Have seen him and placed him on a  salvage regimen of Prezista 600mg   Twice daily boosted with Norvir 100mg  twice daily, Tivicay twice daily, Combivir twice daily and once daily Viread.  And since then he has now an UNDETECTABLE VIRAL LOAD <20. CD4 has also risen from 20 to 50  Last week he came into the clinic a second time with severe abdominal pain doubled over with nausea and vomiting. He was admitted to the emergency department and to the hospital. He underwent Midgett was CT abdomen pelvis which was normal. Dr. Luisa Hart hung was consulted and performed an EGD. An EGD the patient's esophagus was grossly normal without any ulcerations. Biopsies were  performed and these did not show any evidence of infection including H. pylori. 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 He is going to followup with Dr. Nicolasa Ducking.  Today again follows up with [place]. He has had problems with nausea and vomiting at times as bad as every other day but this is now improved to every 5 days. I'm hoping his CD4 count in stable 190 him off azithromycin which I think is probably contributing to his nausea. I've instructed him if he goes to having vomiting every other day we may need to hospitalize him for IV antibiotics and fluids and hydration to ensure that his antivirals are being absorbed properly.   Review of Systems  Constitutional: Negative for fever, chills, diaphoresis, activity change, appetite change, fatigue and unexpected weight change.  HENT: Negative for congestion, sore throat, rhinorrhea, sneezing, trouble swallowing and sinus pressure.   Eyes: Negative for photophobia and visual disturbance.  Respiratory: Negative for cough, chest tightness, shortness of breath, wheezing and stridor.   Cardiovascular: Negative for chest pain, palpitations and leg swelling.  Gastrointestinal: Positive for nausea and vomiting. Negative for diarrhea, constipation, blood in stool, abdominal distention and anal bleeding.  Genitourinary: Negative for dysuria, hematuria, flank pain and difficulty urinating.  Musculoskeletal: Positive for back pain and arthralgias. Negative for myalgias, joint swelling and gait problem.  Skin: Positive for rash. Negative for color change and wound.  Neurological: Negative for dizziness, tremors, weakness and light-headedness.  Hematological: Negative for adenopathy. Does not bruise/bleed easily.  Psychiatric/Behavioral:  Negative for behavioral problems, confusion, sleep disturbance, dysphoric mood, decreased concentration and agitation.       Objective:   Physical Exam  Constitutional: He is oriented to person,  place, and time. No distress.  HENT:  Head: Normocephalic and atraumatic.  Mouth/Throat: Posterior oropharyngeal edema present. No oropharyngeal exudate.  Eyes: Conjunctivae and EOM are normal. Pupils are equal, round, and reactive to light. Right eye exhibits no discharge and no exudate. Left eye exhibits no discharge and no exudate. Right conjunctiva is not injected. Right conjunctiva has no hemorrhage. Left conjunctiva is not injected. Left conjunctiva has no hemorrhage.  Neck: Normal range of motion. Neck supple.  Cardiovascular: Regular rhythm and normal heart sounds.  Exam reveals no gallop and no friction rub.   No murmur heard. Pulmonary/Chest: Effort normal and breath sounds normal. No respiratory distress. He has no wheezes. He has no rales.  Abdominal: He exhibits no distension. There is tenderness. There is no rebound.  Musculoskeletal: He exhibits no edema and no tenderness.       Arms: Lymphadenopathy:    He has cervical adenopathy.  Neurological: He is alert and oriented to person, place, and time. He exhibits normal muscle tone. Coordination normal.  Skin: Skin is warm and dry. No erythema. No pallor.  Psychiatric: His behavior is normal. Judgment and thought content normal. His mood appears anxious. He exhibits a depressed mood.          Assessment & Plan:   #1 HIV: Highly Resistant Virus.   See above discussion Prezista with activity by Genotype but not by phenotype.  Tivicay with activity but with 148 and 140 mutations 100 fold less active. He does NOT have any NRTI or NNRTI activity by genotype. Phenotype only showed activity of 3tc/emtricitabine. sustiva by phenotype. 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 Hopefully we can get him off of his azithromycin if his T cell count was stable 100.

## 2012-09-24 ENCOUNTER — Other Ambulatory Visit: Payer: Medicaid Other

## 2012-09-24 ENCOUNTER — Other Ambulatory Visit: Payer: Self-pay | Admitting: Infectious Disease

## 2012-09-24 DIAGNOSIS — Z952 Presence of prosthetic heart valve: Secondary | ICD-10-CM

## 2012-09-24 DIAGNOSIS — B20 Human immunodeficiency virus [HIV] disease: Secondary | ICD-10-CM

## 2012-09-24 LAB — COMPLETE METABOLIC PANEL WITH GFR
ALT: 11 U/L (ref 0–53)
Albumin: 4.4 g/dL (ref 3.5–5.2)
Alkaline Phosphatase: 55 U/L (ref 39–117)
CO2: 24 mEq/L (ref 19–32)
Potassium: 4.3 mEq/L (ref 3.5–5.3)
Sodium: 137 mEq/L (ref 135–145)
Total Bilirubin: 0.5 mg/dL (ref 0.3–1.2)
Total Protein: 7 g/dL (ref 6.0–8.3)

## 2012-09-24 LAB — PROTIME-INR
INR: 1.42 (ref <1.50)
Prothrombin Time: 17.1 seconds — ABNORMAL HIGH (ref 11.6–15.2)

## 2012-09-24 LAB — CBC WITH DIFFERENTIAL/PLATELET
Hemoglobin: 12.8 g/dL — ABNORMAL LOW (ref 13.0–17.0)
Lymphs Abs: 2.8 10*3/uL (ref 0.7–4.0)
Monocytes Relative: 7 % (ref 3–12)
Neutro Abs: 2.5 10*3/uL (ref 1.7–7.7)
Neutrophils Relative %: 43 % (ref 43–77)
RBC: 3.27 MIL/uL — ABNORMAL LOW (ref 4.22–5.81)
WBC: 5.8 10*3/uL (ref 4.0–10.5)

## 2012-09-25 LAB — T-HELPER CELL (CD4) - (RCID CLINIC ONLY): CD4 T Cell Abs: 110 uL — ABNORMAL LOW (ref 400–2700)

## 2012-10-06 LAB — HIV-1 INTEGRASE GENOTYPE

## 2012-10-08 ENCOUNTER — Encounter: Payer: Self-pay | Admitting: Internal Medicine

## 2012-10-08 ENCOUNTER — Ambulatory Visit (INDEPENDENT_AMBULATORY_CARE_PROVIDER_SITE_OTHER): Payer: Medicaid Other | Admitting: Internal Medicine

## 2012-10-08 VITALS — BP 112/76 | HR 85 | Temp 98.4°F | Wt 119.0 lb

## 2012-10-08 DIAGNOSIS — E46 Unspecified protein-calorie malnutrition: Secondary | ICD-10-CM

## 2012-10-08 DIAGNOSIS — R110 Nausea: Secondary | ICD-10-CM

## 2012-10-08 MED ORDER — ENSURE PO LIQD: 237.0000 mL | Freq: Two times a day (BID) | ORAL | Status: DC

## 2012-10-08 MED ORDER — PROCHLORPERAZINE 25 MG RE SUPP: 25.0000 mg | Freq: Two times a day (BID) | RECTAL | Status: DC | PRN

## 2012-10-08 NOTE — Progress Notes (Signed)
[facility] CLINIC NOTE  RFV: routine follow up Subjective:    Patient ID: Brandon [name], male    DOB: [birth year], 47 y.o.   MRN: [id number]  HPI Brandon [name] is a 47yo M with multidrug resistant HIV virus, CD 4  Count of 110/VL <20,Currently on on DLG BID/DRVr BID/Combivir BID/TDF daily plus OI proph with dapsone, and azithro weekly. formerly followed at [facility]. Apparently he had been taken off ARV and placed with hospice in the past. GENO resistance to all NNRTIs and all NRTIs, Resistance to all PI except DRV which had some activity genotypically,, R to II, inc  RAL and Elvitegravir and 100X reduced S to dolutegravir having both a 148H and 140S and with Dual tropic virus.   02/12/2010 phenotype at [facility] showed: RT: NRTI: ABC, DDI, D4T, AZT, TDF: resistant; 3TC, FTC: susceptible; NNRTI: EFV susceptible; RPV, NVP, ETR, DLV: Resistant; PI: pan-resistant   S: Has had ongoing joint pain, but in the last 5-81yrs he feels that it is getting worse. Cervical and low back hurt the most then followed knees.  For ongoing nausea, automotically using  Phenergan suppository. Oral compazine worked better than phenergan. Wanted to try. Not using it often disovable zofran,   Current Outpatient Prescriptions on File Prior to Visit  Medication Sig Dispense Refill  . azithromycin (ZITHROMAX) 600 MG tablet Take 1,200 mg by mouth every 7 (seven) days. On Saturday      . Clobetasol Propionate 0.05 % lotion Apply 1 application topically 2 (two) times daily as needed (for rash).      . dapsone 100 MG tablet Take 1 tablet (100 mg total) by mouth daily.  30 tablet  5  . darunavir (PREZISTA) 600 MG tablet Take 1 tablet (600 mg total) by mouth 2 (two) times daily with a meal.  60 tablet  3  . dicyclomine (BENTYL) 20 MG tablet TAKE 1 TABLET (20 MG TOTAL) BY MOUTH THREE TIMES DAILY.  90 tablet  0  . Dolutegravir Sodium (TIVICAY) 50 MG TABS Take 50 mg by mouth 2 (two) times daily.      Marland Kitchen EPINEPHrine (EPI-PEN) 0.3 mg/0.3 mL  DEVI Inject 0.3 mLs (0.3 mg total) into the muscle once.  1 Device  1  . fluconazole (DIFLUCAN) 100 MG tablet Take 1 tablet (100 mg total) by mouth daily.  10 tablet  0  . lamiVUDine-zidovudine (COMBIVIR) 150-300 MG per tablet Take 1 tablet by mouth 2 (two) times daily.  60 tablet  3  . LORazepam (ATIVAN) 1 MG tablet Take 1 tablet (1 mg total) by mouth every 8 (eight) hours as needed for anxiety (or muscle spasms).  90 tablet  4  . megestrol (MEGACE) 400 MG/10ML suspension Take 20 mLs (800 mg total) by mouth daily.  480 mL  5  . morphine (MS CONTIN) 15 MG 12 hr tablet Take 1 tablet (15 mg total) by mouth 2 (two) times daily.  60 tablet  0  . morphine (MSIR) 15 MG tablet Take 1 tablet (15 mg total) by mouth 2 (two) times daily as needed for pain.  60 tablet  0  . ondansetron (ZOFRAN ODT) 8 MG disintegrating tablet Take 1 tablet (8 mg total) by mouth every 8 (eight) hours as needed for nausea.  90 tablet  4  . ritonavir (NORVIR) 100 MG TABS Take 1 tablet (100 mg total) by mouth daily.  30 tablet  11  . tenofovir (VIREAD) 300 MG tablet Take 1 tablet (300 mg total)  by mouth daily.  30 tablet  3  . valACYclovir (VALTREX) 1000 MG tablet Take 1 tablet (1,000 mg total) by mouth daily.  30 tablet  0  . warfarin (COUMADIN) 5 MG tablet Take 2.5-5 mg by mouth daily. Alternating days      . zolpidem (AMBIEN) 10 MG tablet Take 1 tablet (10 mg total) by mouth at bedtime as needed for sleep. Take 1 tablet (10 mg total) by mouth nightly as needed for Sleep. May repeat times one as needed  30 tablet  4  . pantoprazole (PROTONIX) 40 MG tablet Take 1 tablet (40 mg total) by mouth daily.  30 tablet  11   Current Facility-Administered Medications on File Prior to Visit  Medication Dose Route Frequency Provider Last Rate Last Dose  . 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  Fam: arthritis   Review of  Systems No appetite, chronic pain and ongoing nausea. 12 point ros is otherwise negative    Objective:   Physical Exam BP 112/76  Pulse 85  Temp(Src) 98.4 F (36.9 C) (Oral)  Wt 119 lb (53.978 kg)  BMI 17.57 kg/m2 Physical Exam  Constitutional: He is oriented to person, place, and time. He appears well-developed and well-nourished. No distress.  HENT:  Mouth/Throat: Oropharynx is clear and moist. No oropharyngeal exudate.  Cardiovascular: Normal rate, regular rhythm and normal heart sounds. Exam reveals no gallop and no friction rub.  No murmur heard.  Pulmonary/Chest: Effort normal and breath sounds normal. No respiratory distress. He has no wheezes.  Abdominal: Soft. Bowel sounds are normal. He exhibits no distension. There is no tenderness.  Lymphadenopathy:  He has no cervical adenopathy.  Neurological: He is alert and oriented to person, place, and time.  Skin: Skin is warm and dry. No rash noted. No erythema.  Psychiatric: He has a normal mood and affect. His behavior is normal.       Assessment & Plan:  HIV = well controlled currently has good virologic control. Continue with salvage regimen  oi proph = continue with dapsone and can stop azithromycin 4 wks  Nausea = compazine  Severe Protein Malnutrition (BMI 17) will give rx for strawberry/vanilla/butter pecan ensure  rtc 6-8 wks.

## 2012-10-13 ENCOUNTER — Other Ambulatory Visit: Payer: Self-pay | Admitting: Infectious Disease

## 2012-10-15 ENCOUNTER — Telehealth: Payer: Self-pay | Admitting: *Deleted

## 2012-10-15 NOTE — Telephone Encounter (Signed)
Call from Sam at [facility] regarding patient's morphine Rx. He wanted to send a driver over to pick it up, which had been done previously, and per our clinic supervisor, Tomasita Morrow, we are not doing this going forward. Patient will have to come to the clinic to sign for it. I left a voice mail with Macdonald to let us know if he wanted his meds sent to a closer pharmacy that is more convenient for him and also a message regarding the morphine Rx. Wendall Mola

## 2012-10-20 ENCOUNTER — Other Ambulatory Visit: Payer: Self-pay | Admitting: Licensed Clinical Social Worker

## 2012-10-20 DIAGNOSIS — B20 Human immunodeficiency virus [HIV] disease: Secondary | ICD-10-CM

## 2012-10-20 MED ORDER — MORPHINE SULFATE 15 MG PO TABS: 15.0000 mg | ORAL_TABLET | Freq: Two times a day (BID) | ORAL | Status: DC | PRN

## 2012-10-20 MED ORDER — MORPHINE SULFATE ER 15 MG PO TBCR: 15.0000 mg | EXTENDED_RELEASE_TABLET | Freq: Two times a day (BID) | ORAL | Status: DC

## 2012-11-05 ENCOUNTER — Other Ambulatory Visit: Payer: Self-pay | Admitting: Infectious Disease

## 2012-11-05 DIAGNOSIS — F411 Generalized anxiety disorder: Secondary | ICD-10-CM

## 2012-11-05 DIAGNOSIS — R110 Nausea: Secondary | ICD-10-CM

## 2012-11-05 DIAGNOSIS — B370 Candidal stomatitis: Secondary | ICD-10-CM

## 2012-11-11 ENCOUNTER — Other Ambulatory Visit: Payer: Self-pay | Admitting: Infectious Disease

## 2012-11-18 ENCOUNTER — Other Ambulatory Visit: Payer: Self-pay | Admitting: *Deleted

## 2012-11-18 ENCOUNTER — Other Ambulatory Visit: Payer: Self-pay | Admitting: Internal Medicine

## 2012-11-18 DIAGNOSIS — B20 Human immunodeficiency virus [HIV] disease: Secondary | ICD-10-CM

## 2012-11-18 MED ORDER — MORPHINE SULFATE 15 MG PO TABS: 15.0000 mg | ORAL_TABLET | Freq: Two times a day (BID) | ORAL | Status: DC | PRN

## 2012-11-18 MED ORDER — MORPHINE SULFATE ER 15 MG PO TBCR: 15.0000 mg | EXTENDED_RELEASE_TABLET | Freq: Two times a day (BID) | ORAL | Status: DC

## 2012-11-30 ENCOUNTER — Ambulatory Visit (INDEPENDENT_AMBULATORY_CARE_PROVIDER_SITE_OTHER): Payer: Medicaid Other | Admitting: Infectious Disease

## 2012-11-30 ENCOUNTER — Encounter: Payer: Self-pay | Admitting: Infectious Disease

## 2012-11-30 VITALS — BP 161/98 | HR 85 | Temp 98.6°F | Wt 119.0 lb

## 2012-11-30 DIAGNOSIS — Z954 Presence of other heart-valve replacement: Secondary | ICD-10-CM

## 2012-11-30 DIAGNOSIS — K31819 Angiodysplasia of stomach and duodenum without bleeding: Secondary | ICD-10-CM

## 2012-11-30 DIAGNOSIS — Z23 Encounter for immunization: Secondary | ICD-10-CM

## 2012-11-30 DIAGNOSIS — R112 Nausea with vomiting, unspecified: Secondary | ICD-10-CM

## 2012-11-30 DIAGNOSIS — Z952 Presence of prosthetic heart valve: Secondary | ICD-10-CM

## 2012-11-30 DIAGNOSIS — I359 Nonrheumatic aortic valve disorder, unspecified: Secondary | ICD-10-CM

## 2012-11-30 DIAGNOSIS — B20 Human immunodeficiency virus [HIV] disease: Secondary | ICD-10-CM

## 2012-11-30 LAB — COMPLETE METABOLIC PANEL WITH GFR
Albumin: 4.4 g/dL (ref 3.5–5.2)
BUN: 8 mg/dL (ref 6–23)
Calcium: 9.6 mg/dL (ref 8.4–10.5)
Chloride: 107 mEq/L (ref 96–112)
GFR, Est Non African American: 73 mL/min
Glucose, Bld: 74 mg/dL (ref 70–99)
Potassium: 4.3 mEq/L (ref 3.5–5.3)

## 2012-11-30 LAB — PROTIME-INR: Prothrombin Time: 18.4 seconds — ABNORMAL HIGH (ref 11.6–15.2)

## 2012-11-30 MED ORDER — MORPHINE SULFATE 15 MG PO TABS: 15.0000 mg | ORAL_TABLET | Freq: Three times a day (TID) | ORAL | Status: DC | PRN

## 2012-11-30 NOTE — Progress Notes (Signed)
Subjective:    Patient ID: Brandon [name], male    DOB: September 05, 1965, 47 y.o.   MRN: [id number]  HPI   Mr Knight is a highly complicated man with HIV/AIDS and Multi-DRUG RESISTANT virus formerly followed at [facility]. Apparently he had been taken off ARV and placed with hospice.  He has been on various complicated antiretroviral regimens in the past, with unfortunate GENOTYPIC resistance to all non-nucleoside reverse transcriptase inhibitors and all NRTIs, Resistance to all protease inhibitors with the exception of Prezista which had some activity genotypically,, Resistance to Isentress, and Elvitegravir and 100X reduced S to dolutegravir having both a 148H and 140S  and with Dual tropic virus.  He had most recently been on a regimen of Twice daily Prezista, Norvir, twice daily AZT, once daily Viread, with these meds stopped n 02/2012. Prior to this had been on similar regimen but with isentress and maraviroc (despite IN R and dual mixed virus. At that time Dolutegravir was not available)  02/12/2010 phenotype at [facility] showed:  RT: NRTI: ABC, DDI, D4T, AZT, TDF: resistant; 3TC, FTC: susceptible; NNRTI: EFV susceptible; RPV, NVP, ETR, DLV: Resistant; PI: pan-resistant  He had decided now to go back onto ARVS and is referred to [facility].  We  Have seen him and placed him on a  salvage regimen of Prezista 600mg   Twice daily boosted with Norvir 100mg  twice daily, Tivicay twice daily, Combivir twice daily and once daily Viread.  And since then he has now an UNDETECTABLE VIRAL LOAD <20.  And CD4 >100   He underwent Midgett was CT abdomen pelvis which was normal. Dr. Luisa Hart hung was consulted and performed an EGD. An EGD the patient's esophagus was grossly normal without any ulcerations. Biopsies were performed and these did not show any evidence of infection including H. pylori. 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    He does continue to have trouble  with nausea more so in the nighttime and has episode of vomiting typically on a weekly basis. He tries to manage this with his oral antinausea medicines and at times rectal suppository. I suggested that he get a followup with his [facility] neurologist Dr. Elnoria Howard. However he feels that he did not have the best therapeutic relationship based on their exchange at the end of his procedure with the EGD. I told him I was sorry to hear that and that I had the very highest opinion of Dr. Elnoria Howard. Be happy to refer him also to another gastroenterologist.  Patient also is concerned about the MS Contin that he is taking but makes him feel "drunk. He also feels as if it does not give him much benefit in terms of analgesia. I tried to explain to him that the MS Contin provides a basal rate on top of which is MSIR helps. He tells me that his pain is not constant there other episodic and he would prefer if he could take MSIR 3 times daily. I am amenable to that. However if we have further adjustments and manipulations in his narcotics I would like him to be seen by Max our counselor.  Review of Systems  Constitutional: Negative for fever, chills, diaphoresis, activity change, appetite change, fatigue and unexpected weight change.  HENT: Negative for congestion, sore throat, rhinorrhea, sneezing, trouble swallowing and sinus pressure.   Eyes: Negative for photophobia and visual disturbance.  Respiratory: Negative for cough, chest tightness, shortness of breath, wheezing and stridor.   Cardiovascular:  Negative for chest pain, palpitations and leg swelling.  Gastrointestinal: Positive for nausea and vomiting. Negative for diarrhea, constipation, blood in stool, abdominal distention and anal bleeding.  Genitourinary: Negative for dysuria, hematuria, flank pain and difficulty urinating.  Musculoskeletal: Negative for myalgias, back pain, joint swelling, arthralgias and gait problem.  Skin: Negative for color change, rash and  wound.  Neurological: Negative for dizziness, tremors, weakness and light-headedness.  Hematological: Negative for adenopathy. Does not bruise/bleed easily.  Psychiatric/Behavioral: Negative for behavioral problems, confusion, sleep disturbance, dysphoric mood, decreased concentration and agitation.       Objective:   Physical Exam  Constitutional: He is oriented to person, place, and time. No distress.  HENT:  Head: Normocephalic and atraumatic.  Mouth/Throat: Posterior oropharyngeal edema present. No oropharyngeal exudate.  Eyes: Conjunctivae and EOM are normal. Pupils are equal, round, and reactive to light. Right eye exhibits no discharge and no exudate. Left eye exhibits no discharge and no exudate. Right conjunctiva is not injected. Right conjunctiva has no hemorrhage. Left conjunctiva is not injected. Left conjunctiva has no hemorrhage.  Neck: Normal range of motion. Neck supple.  Cardiovascular: Regular rhythm and normal heart sounds.  Exam reveals no gallop and no friction rub.   No murmur heard. Pulmonary/Chest: Effort normal and breath sounds normal. No respiratory distress. He has no wheezes. He has no rales.  Abdominal: He exhibits no distension. There is tenderness. There is no rebound.  Musculoskeletal: He exhibits no edema and no tenderness.       Arms: Lymphadenopathy:    He has no cervical adenopathy.  Neurological: He is alert and oriented to person, place, and time. He exhibits normal muscle tone. Coordination normal.  Skin: Skin is warm and dry. No erythema. No pallor.  Psychiatric: His behavior is normal. Judgment and thought content normal. His mood appears not anxious. He does not exhibit a depressed mood.          Assessment & Plan:   #1 HIV: Highly Resistant Virus.  Continue the  --Tivicay 50mg  BID --Prezista 600mg  BID with  --Norvir 100mg  BID --Combivir 1 tablet BID --Viread q daily  HE SHOULD NOT TAKE ENSURE AT THE SAME TIME AS HIS  TIVICAY!!!!  I spent greater than 45 minutes with the patient including greater than 50% of time in face to face counsel of the patient and in coordination of their care.  Recheck labs  OI prophylaxis: continue dapsone   Dysphagia: N, V: refer to [facility] per pt preference  Pain: change mass IR 3 times a day and discontinue his MS Contin see above discussion.  Hypertension: We'll add another hypertensive agent if he continues to have high blood pressures. Pressures he states at home it runs lower.  Healthcare maintenance: Patient given flu vaccine.  AVR: mechanical on coumadin followed at [facility]. I will check INR for his clinic as courtesy

## 2012-12-01 LAB — T-HELPER CELL (CD4) - (RCID CLINIC ONLY): CD4 T Cell Abs: 100 /uL — ABNORMAL LOW (ref 400–2700)

## 2012-12-14 ENCOUNTER — Other Ambulatory Visit: Payer: Self-pay | Admitting: Infectious Disease

## 2012-12-14 ENCOUNTER — Other Ambulatory Visit: Payer: Self-pay | Admitting: Infectious Diseases

## 2012-12-14 ENCOUNTER — Other Ambulatory Visit: Payer: Self-pay | Admitting: *Deleted

## 2012-12-14 DIAGNOSIS — B20 Human immunodeficiency virus [HIV] disease: Secondary | ICD-10-CM

## 2012-12-14 DIAGNOSIS — Z952 Presence of prosthetic heart valve: Secondary | ICD-10-CM

## 2012-12-14 MED ORDER — ZOLPIDEM TARTRATE 10 MG PO TABS: 10.0000 mg | ORAL_TABLET | Freq: Every evening | ORAL | Status: DC | PRN

## 2013-01-13 ENCOUNTER — Other Ambulatory Visit: Payer: Self-pay | Admitting: Infectious Disease

## 2013-02-09 ENCOUNTER — Other Ambulatory Visit: Payer: Self-pay | Admitting: Licensed Clinical Social Worker

## 2013-02-09 DIAGNOSIS — B20 Human immunodeficiency virus [HIV] disease: Secondary | ICD-10-CM

## 2013-02-09 MED ORDER — MORPHINE SULFATE 15 MG PO TABS: 15.0000 mg | ORAL_TABLET | Freq: Three times a day (TID) | ORAL | Status: DC | PRN

## 2013-02-09 MED ORDER — MORPHINE SULFATE ER 15 MG PO TBCR: 15.0000 mg | EXTENDED_RELEASE_TABLET | Freq: Two times a day (BID) | ORAL | Status: DC

## 2013-02-10 ENCOUNTER — Other Ambulatory Visit: Payer: Self-pay | Admitting: Infectious Disease

## 2013-02-10 DIAGNOSIS — B20 Human immunodeficiency virus [HIV] disease: Secondary | ICD-10-CM

## 2013-02-12 ENCOUNTER — Other Ambulatory Visit: Payer: Self-pay | Admitting: Infectious Disease

## 2013-02-16 ENCOUNTER — Other Ambulatory Visit: Payer: Medicaid Other

## 2013-02-19 ENCOUNTER — Other Ambulatory Visit: Payer: Medicaid Other

## 2013-02-19 DIAGNOSIS — B20 Human immunodeficiency virus [HIV] disease: Secondary | ICD-10-CM

## 2013-02-19 LAB — COMPLETE METABOLIC PANEL WITH GFR
ALT: 13 U/L (ref 0–53)
Alkaline Phosphatase: 84 U/L (ref 39–117)
BUN: 13 mg/dL (ref 6–23)
CO2: 26 mEq/L (ref 19–32)
Creat: 1.5 mg/dL — ABNORMAL HIGH (ref 0.50–1.35)
GFR, Est African American: 64 mL/min
Total Bilirubin: 0.6 mg/dL (ref 0.3–1.2)
Total Protein: 7.6 g/dL (ref 6.0–8.3)

## 2013-02-19 LAB — CBC WITH DIFFERENTIAL/PLATELET
Basophils Absolute: 0 10*3/uL (ref 0.0–0.1)
Basophils Relative: 1 % (ref 0–1)
Eosinophils Absolute: 0.2 10*3/uL (ref 0.0–0.7)
Eosinophils Relative: 4 % (ref 0–5)
Lymphs Abs: 2.3 10*3/uL (ref 0.7–4.0)
MCH: 39.9 pg — ABNORMAL HIGH (ref 26.0–34.0)
MCV: 113.4 fL — ABNORMAL HIGH (ref 78.0–100.0)
Platelets: 203 10*3/uL (ref 150–400)
RDW: 12.9 % (ref 11.5–15.5)

## 2013-02-19 LAB — T-HELPER CELL (CD4) - (RCID CLINIC ONLY)
CD4 % Helper T Cell: 9 % — ABNORMAL LOW (ref 33–55)
CD4 T Cell Abs: 170 /uL — ABNORMAL LOW (ref 400–2700)

## 2013-02-22 LAB — HIV-1 RNA QUANT-NO REFLEX-BLD
HIV 1 RNA Quant: <20 copies/mL (ref <20)
HIV-1 RNA Quant, Log: <1.3 {Log} (ref <1.30)

## 2013-03-01 ENCOUNTER — Ambulatory Visit: Payer: Medicaid Other | Admitting: Infectious Disease

## 2013-03-10 ENCOUNTER — Other Ambulatory Visit: Payer: Self-pay | Admitting: Infectious Disease

## 2013-03-12 ENCOUNTER — Other Ambulatory Visit: Payer: Self-pay | Admitting: *Deleted

## 2013-03-12 DIAGNOSIS — B20 Human immunodeficiency virus [HIV] disease: Secondary | ICD-10-CM

## 2013-03-12 MED ORDER — AZITHROMYCIN 600 MG PO TABS: 1200.0000 mg | ORAL_TABLET | ORAL | Status: DC

## 2013-03-12 MED ORDER — LORAZEPAM 1 MG PO TABS: 1.0000 mg | ORAL_TABLET | Freq: Three times a day (TID) | ORAL | Status: DC | PRN

## 2013-03-17 ENCOUNTER — Ambulatory Visit (INDEPENDENT_AMBULATORY_CARE_PROVIDER_SITE_OTHER): Payer: Medicaid Other | Admitting: Infectious Disease

## 2013-03-17 ENCOUNTER — Encounter: Payer: Self-pay | Admitting: Infectious Disease

## 2013-03-17 VITALS — BP 112/80 | HR 77 | Temp 98.2°F | Wt 118.0 lb

## 2013-03-17 DIAGNOSIS — F32A Depression, unspecified: Secondary | ICD-10-CM

## 2013-03-17 DIAGNOSIS — F3289 Other specified depressive episodes: Secondary | ICD-10-CM

## 2013-03-17 DIAGNOSIS — B20 Human immunodeficiency virus [HIV] disease: Secondary | ICD-10-CM

## 2013-03-17 DIAGNOSIS — I10 Essential (primary) hypertension: Secondary | ICD-10-CM

## 2013-03-17 DIAGNOSIS — G894 Chronic pain syndrome: Secondary | ICD-10-CM

## 2013-03-17 DIAGNOSIS — R0981 Nasal congestion: Secondary | ICD-10-CM

## 2013-03-17 DIAGNOSIS — F329 Major depressive disorder, single episode, unspecified: Secondary | ICD-10-CM

## 2013-03-17 DIAGNOSIS — J3489 Other specified disorders of nose and nasal sinuses: Secondary | ICD-10-CM

## 2013-03-17 MED ORDER — FLUNISOLIDE 25 MCG/ACT (0.025%) NA SOLN: 2.0000 | Freq: Every evening | NASAL | Status: DC | PRN

## 2013-03-17 MED ORDER — ESCITALOPRAM OXALATE 5 MG PO TABS: 5.0000 mg | ORAL_TABLET | Freq: Every day | ORAL | Status: DC

## 2013-03-17 MED ORDER — MORPHINE SULFATE 15 MG PO TABS: 15.0000 mg | ORAL_TABLET | Freq: Three times a day (TID) | ORAL | Status: DC | PRN

## 2013-03-17 MED ORDER — MORPHINE SULFATE ER 15 MG PO TBCR: 15.0000 mg | EXTENDED_RELEASE_TABLET | Freq: Two times a day (BID) | ORAL | Status: DC

## 2013-03-17 NOTE — Progress Notes (Signed)
Subjective:    Patient ID: Brandon [name], male    DOB: [birth year], 48 y.o.   MRN: [id number]  HPI   Mr Marschall is a highly complicated man with HIV/AIDS and Multi-DRUG RESISTANT virus formerly followed at [facility]. Apparently he had been taken off ARV and placed with hospice.  He has been on various complicated antiretroviral regimens in the past, with unfortunate GENOTYPIC resistance to all non-nucleoside reverse transcriptase inhibitors and all NRTIs, Resistance to all protease inhibitors with the exception of Prezista which had some activity genotypically,, Resistance to Isentress, and Elvitegravir and 100X reduced S to dolutegravir having both a 148H and 140S  and with Dual tropic virus.  He had most recently been on a regimen of Twice daily Prezista, Norvir, twice daily AZT, once daily Viread, with these meds stopped n 02/2012. Prior to this had been on similar regimen but with isentress and maraviroc (despite IN R and dual mixed virus. At that time Dolutegravir was not available)  02/12/2010 phenotype at [facility] showed:  RT: NRTI: ABC, DDI, D4T, AZT, TDF: resistant; 3TC, FTC: susceptible; NNRTI: EFV susceptible; RPV, NVP, ETR, DLV: Resistant; PI: pan-resistant  He had decided now to go back onto ARVS and is referred to [facility].  We  Have seen him and placed him on a  salvage regimen of Prezista 600mg   Twice daily boosted with Norvir 100mg  twice daily, Tivicay twice daily, Combivir twice daily and once daily Viread.  And since then he has now an UNDETECTABLE VIRAL LOAD <20.  And CD4 >150.  He is maintained perfect virological suppression since April of 2000 at 15 with a most recent labs done in December showing an undetectable viral load.  He currently is doing recently well he has been suffering from fatigue and some depressive symptoms related the fact that his wife now for diagnosis cervical cancer and is going to undergo a laser procedure. He is open to being treated with  antidepressant medication and is seeing our counselor here in clinic.     Review of Systems  Constitutional: Positive for fatigue. Negative for fever, chills, diaphoresis, activity change, appetite change and unexpected weight change.  HENT: Negative for congestion, rhinorrhea, sinus pressure, sneezing, sore throat and trouble swallowing.   Eyes: Negative for photophobia and visual disturbance.  Respiratory: Negative for cough, chest tightness, shortness of breath, wheezing and stridor.   Cardiovascular: Negative for chest pain, palpitations and leg swelling.  Gastrointestinal: Positive for nausea and vomiting. Negative for diarrhea, constipation, blood in stool, abdominal distention and anal bleeding.  Genitourinary: Negative for dysuria, hematuria, flank pain and difficulty urinating.  Musculoskeletal: Negative for arthralgias, back pain, gait problem, joint swelling and myalgias.  Skin: Negative for color change, rash and wound.  Neurological: Negative for dizziness, tremors, weakness and light-headedness.  Hematological: Negative for adenopathy. Does not bruise/bleed easily.  Psychiatric/Behavioral: Positive for dysphoric mood. Negative for behavioral problems, confusion, sleep disturbance, decreased concentration and agitation.       Objective:   Physical Exam  Constitutional: He is oriented to person, place, and time. No distress.  HENT:  Head: Normocephalic and atraumatic.  Mouth/Throat: Posterior oropharyngeal edema present. No oropharyngeal exudate.  Eyes: Conjunctivae and EOM are normal. Pupils are equal, round, and reactive to light. Right eye exhibits no discharge and no exudate. Left eye exhibits no discharge and no exudate. Right conjunctiva is not injected. Right conjunctiva has no hemorrhage. Left conjunctiva is not injected. Left conjunctiva has no hemorrhage.  Neck: Normal range  of motion. Neck supple.  Cardiovascular: Regular rhythm and normal heart sounds.  Exam  reveals no gallop and no friction rub.   No murmur heard. Pulmonary/Chest: Effort normal and breath sounds normal. No respiratory distress. He has no wheezes. He has no rales.  Abdominal: He exhibits no distension. There is tenderness. There is no rebound.  Musculoskeletal: He exhibits no edema and no tenderness.       Arms: Lymphadenopathy:    He has no cervical adenopathy.  Neurological: He is alert and oriented to person, place, and time. He exhibits normal muscle tone. Coordination normal.  Skin: Skin is warm and dry. No erythema. No pallor.  Psychiatric: His behavior is normal. Judgment and thought content normal. His mood appears not anxious. He does not exhibit a depressed mood.          Assessment & Plan:   #1 HIV: Highly Resistant Virus.  Continue the  --Tivicay 50mg  BID --Prezista 600mg  BID with  --Norvir 100mg  BID --Combivir 1 tablet BID --Viread q daily  HE SHOULD NOT TAKE ENSURE AT THE SAME TIME AS HIS TIVICAY!!!!   I spent greater than 25 minutes with the patient including greater than 50% of time in face to face counsel of the patient and in coordination of their care.  OI prophylaxis: continue dapsone   Depression: Start low-dose Lexapro to 5 mg per day   Pain: See both MS Contin and short-acting MSIR  Hypertension: Blood pressure better controlled    AVR: mechanical on coumadin followed at [facility]. I will check INR for his clinic as courtesy

## 2013-04-08 ENCOUNTER — Other Ambulatory Visit: Payer: Self-pay | Admitting: Licensed Clinical Social Worker

## 2013-04-08 DIAGNOSIS — B20 Human immunodeficiency virus [HIV] disease: Secondary | ICD-10-CM

## 2013-04-08 MED ORDER — LORAZEPAM 1 MG PO TABS: 1.0000 mg | ORAL_TABLET | Freq: Three times a day (TID) | ORAL | Status: DC | PRN

## 2013-04-22 ENCOUNTER — Ambulatory Visit (INDEPENDENT_AMBULATORY_CARE_PROVIDER_SITE_OTHER): Payer: Medicaid Other | Admitting: Infectious Disease

## 2013-04-22 ENCOUNTER — Encounter: Payer: Self-pay | Admitting: Infectious Disease

## 2013-04-22 VITALS — BP 132/86 | HR 99 | Temp 98.3°F | Wt 116.0 lb

## 2013-04-22 DIAGNOSIS — Z954 Presence of other heart-valve replacement: Secondary | ICD-10-CM

## 2013-04-22 DIAGNOSIS — Z952 Presence of prosthetic heart valve: Secondary | ICD-10-CM

## 2013-04-22 DIAGNOSIS — F3289 Other specified depressive episodes: Secondary | ICD-10-CM

## 2013-04-22 DIAGNOSIS — G894 Chronic pain syndrome: Secondary | ICD-10-CM

## 2013-04-22 DIAGNOSIS — F329 Major depressive disorder, single episode, unspecified: Secondary | ICD-10-CM

## 2013-04-22 DIAGNOSIS — F32A Depression, unspecified: Secondary | ICD-10-CM

## 2013-04-22 DIAGNOSIS — I10 Essential (primary) hypertension: Secondary | ICD-10-CM

## 2013-04-22 DIAGNOSIS — B20 Human immunodeficiency virus [HIV] disease: Secondary | ICD-10-CM

## 2013-04-22 MED ORDER — MORPHINE SULFATE 15 MG PO TABS: 15.0000 mg | ORAL_TABLET | Freq: Three times a day (TID) | ORAL | Status: DC | PRN

## 2013-04-22 MED ORDER — MORPHINE SULFATE ER 15 MG PO TBCR: 15.0000 mg | EXTENDED_RELEASE_TABLET | Freq: Two times a day (BID) | ORAL | Status: DC

## 2013-04-22 MED ORDER — ESCITALOPRAM OXALATE 10 MG PO TABS: 10.0000 mg | ORAL_TABLET | Freq: Every day | ORAL | Status: DC

## 2013-04-22 NOTE — Progress Notes (Signed)
Subjective:    Patient ID: Brandon [name], male    DOB: 11-21-1965, 48 y.o.   MRN: [id number]  HPI   Brandon [name] is a highly complicated man with HIV/AIDS and Multi-DRUG RESISTANT virus formerly followed at [facility]. Apparently he had been taken off ARV and placed with hospice.  He has been on various complicated antiretroviral regimens in the past, with unfortunate GENOTYPIC resistance to all non-nucleoside reverse transcriptase inhibitors and all NRTIs, Resistance to all protease inhibitors with the exception of Prezista which had some activity genotypically,, Resistance to Isentress, and Elvitegravir and 100X reduced S to dolutegravir having both a 148H and 140S  and with Dual tropic virus.  When he had at [facility] most recently been on a regimen of Twice daily Prezista, Norvir, twice daily AZT, once daily Viread, with these meds stopped n 02/2012. Prior to this had been on similar regimen but with isentress and maraviroc (despite IN R and dual mixed virus. At that time Dolutegravir was not available)  02/12/2010 phenotype at [facility] showed:  RT: NRTI: ABC, DDI, D4T, AZT, TDF: resistant; 3TC, FTC: susceptible; NNRTI: EFV susceptible; RPV, NVP, ETR, DLV: Resistant; PI: pan-resistant  He had decided now to go back onto ARVS and is referred to [facility].  We  Have seen him and placed him on a  salvage regimen of Prezista 600mg   Twice daily boosted with Norvir 100mg  twice daily, Tivicay twice daily, Combivir twice daily and once daily Viread.  And since then he has now an UNDETECTABLE VIRAL LOAD <20.  And CD4 >170 when las checked in December. He has maintained perfect virological suppresion for past 10 months.  He currently is doing recently well he has been suffering from fatigue and some depressive symptoms related the fact that his wife now for diagnosis cervical cancer.  I him a month ago and we started him on Lexapro to treat him for depression. This helped somewhat and his energy improved  although he still at times is suffering from anhedonia depressive symptoms a lack of motivation to go to work. We'll escalate his Lexapro 10 mg and ask him to see Grayland Ormond as was originally planned otherwise he is in relatively good spirits her the circumstances he comes to clinic with his wife today.    Review of Systems  Constitutional: Positive for fatigue. Negative for fever, chills, diaphoresis, activity change, appetite change and unexpected weight change.  HENT: Negative for congestion, rhinorrhea, sinus pressure, sneezing, sore throat and trouble swallowing.   Eyes: Negative for photophobia and visual disturbance.  Respiratory: Negative for cough, chest tightness, shortness of breath, wheezing and stridor.   Cardiovascular: Negative for chest pain, palpitations and leg swelling.  Gastrointestinal: Positive for nausea and vomiting. Negative for diarrhea, constipation, blood in stool, abdominal distention and anal bleeding.  Genitourinary: Negative for dysuria, hematuria, flank pain and difficulty urinating.  Musculoskeletal: Negative for arthralgias, back pain, gait problem, joint swelling and myalgias.  Skin: Negative for color change, rash and wound.  Neurological: Negative for dizziness, tremors, weakness and light-headedness.  Hematological: Negative for adenopathy. Does not bruise/bleed easily.  Psychiatric/Behavioral: Positive for dysphoric mood. Negative for behavioral problems, confusion, sleep disturbance, decreased concentration and agitation.       Objective:   Physical Exam  Constitutional: He is oriented to person, place, and time. No distress.  HENT:  Head: Normocephalic and atraumatic.  Mouth/Throat: Posterior oropharyngeal edema present. No oropharyngeal exudate.  Eyes: Conjunctivae and EOM are normal. Pupils are equal, round, and  reactive to light. Right eye exhibits no discharge and no exudate. Left eye exhibits no discharge and no exudate. Right conjunctiva is not  injected. Right conjunctiva has no hemorrhage. Left conjunctiva is not injected. Left conjunctiva has no hemorrhage.  Neck: Normal range of motion. Neck supple.  Cardiovascular: Regular rhythm and normal heart sounds.  Exam reveals no gallop and no friction rub.   No murmur heard. Pulmonary/Chest: Effort normal and breath sounds normal. No respiratory distress. He has no wheezes. He has no rales.  Abdominal: He exhibits no distension. There is no rebound.  Musculoskeletal: He exhibits no edema and no tenderness.  Lymphadenopathy:    He has no cervical adenopathy.  Neurological: He is alert and oriented to person, place, and time. He exhibits normal muscle tone. Coordination normal.  Skin: Skin is warm and dry. No erythema. No pallor.  Psychiatric: His behavior is normal. Judgment and thought content normal. His mood appears not anxious. He does not exhibit a depressed mood.          Assessment & Plan:   #1 HIV: Highly Resistant Virus.  Continue the  --Tivicay 50mg  BID --Prezista 600mg  BID with  --Norvir 100mg  BID --Combivir 1 tablet BID --Viread q daily  HE SHOULD NOT TAKE ENSURE AT THE SAME TIME AS HIS TIVICAY!!!!  OI prophylaxis: continue dapsone   Depression: Increased dose of Lexapro to 10 mg per day. Had a may with Grayland Ormond for counseling I spent greater than 25 minutes with the patient including greater than 50% of time in face to face counsel of the patient and in coordination of their care.  Pain: See both MS Contin and short-acting MSIR  Hypertension: Blood pressure better controlled   AVR: mechanical on coumadin followed at [facility]. I will check INR for his clinic as courtesy

## 2013-04-29 ENCOUNTER — Ambulatory Visit: Payer: Medicaid Other

## 2013-05-21 ENCOUNTER — Other Ambulatory Visit: Payer: Self-pay | Admitting: Infectious Disease

## 2013-05-21 ENCOUNTER — Telehealth: Payer: Self-pay | Admitting: *Deleted

## 2013-05-21 DIAGNOSIS — B20 Human immunodeficiency virus [HIV] disease: Secondary | ICD-10-CM

## 2013-05-21 MED ORDER — MORPHINE SULFATE 15 MG PO TABS: 15.0000 mg | ORAL_TABLET | Freq: Three times a day (TID) | ORAL | Status: DC | PRN

## 2013-05-21 MED ORDER — MORPHINE SULFATE ER 15 MG PO TBCR: 15.0000 mg | EXTENDED_RELEASE_TABLET | Freq: Two times a day (BID) | ORAL | Status: DC

## 2013-05-21 NOTE — Telephone Encounter (Signed)
Patient called c/o nausea and vomiting x 2 weeks. He said it starts with constipation and then he starts vomiting and this has been happening daily. He states he feels he is dehydrated and advised to either go to the ED or Urgent Care. He said that the urgent care can not provide fluids so he will go to the ED. He also requested his pain meds be filled for Monday pick up. Printed and put in Dr. Lucianne Lei Dam's box. Patient has an Rx for Zofran and this has been sent to his pharmacy. Myrtis Hopping

## 2013-05-24 ENCOUNTER — Other Ambulatory Visit: Payer: Self-pay | Admitting: *Deleted

## 2013-05-24 ENCOUNTER — Telehealth: Payer: Self-pay | Admitting: *Deleted

## 2013-05-24 DIAGNOSIS — B20 Human immunodeficiency virus [HIV] disease: Secondary | ICD-10-CM

## 2013-05-24 MED ORDER — ZOLPIDEM TARTRATE 10 MG PO TABS: 10.0000 mg | ORAL_TABLET | Freq: Every evening | ORAL | Status: DC | PRN

## 2013-05-24 NOTE — Telephone Encounter (Signed)
FINE

## 2013-05-24 NOTE — Telephone Encounter (Signed)
Phoned in. Landis Gandy, RN

## 2013-05-24 NOTE — Telephone Encounter (Signed)
Dr. Tommy Medal, OK to refill Zolpidem tartrate 10 mg?

## 2013-05-24 NOTE — Telephone Encounter (Signed)
We may want him to check in with [place] later this week as well

## 2013-06-21 ENCOUNTER — Other Ambulatory Visit: Payer: Medicaid Other

## 2013-06-21 ENCOUNTER — Other Ambulatory Visit: Payer: Self-pay | Admitting: Licensed Clinical Social Worker

## 2013-06-21 DIAGNOSIS — B20 Human immunodeficiency virus [HIV] disease: Secondary | ICD-10-CM

## 2013-06-21 LAB — CBC WITH DIFFERENTIAL/PLATELET
BASOS ABS: 0 10*3/uL (ref 0.0–0.1)
Basophils Relative: 0 % (ref 0–1)
Eosinophils Absolute: 0.1 10*3/uL (ref 0.0–0.7)
Eosinophils Relative: 3 % (ref 0–5)
HCT: 37 % — ABNORMAL LOW (ref 39.0–52.0)
Hemoglobin: 12.8 g/dL — ABNORMAL LOW (ref 13.0–17.0)
LYMPHS PCT: 52 % — AB (ref 12–46)
Lymphs Abs: 2.4 10*3/uL (ref 0.7–4.0)
MCH: 38.8 pg — ABNORMAL HIGH (ref 26.0–34.0)
MCHC: 34.6 g/dL (ref 30.0–36.0)
MCV: 112.1 fL — ABNORMAL HIGH (ref 78.0–100.0)
Monocytes Absolute: 0.3 10*3/uL (ref 0.1–1.0)
Monocytes Relative: 7 % (ref 3–12)
NEUTROS ABS: 1.7 10*3/uL (ref 1.7–7.7)
NEUTROS PCT: 38 % — AB (ref 43–77)
PLATELETS: 241 10*3/uL (ref 150–400)
RBC: 3.3 MIL/uL — ABNORMAL LOW (ref 4.22–5.81)
RDW: 13.8 % (ref 11.5–15.5)
WBC: 4.6 10*3/uL (ref 4.0–10.5)

## 2013-06-21 LAB — COMPLETE METABOLIC PANEL WITH GFR
ALT: 12 U/L (ref 0–53)
AST: 14 U/L (ref 0–37)
Albumin: 3.6 g/dL (ref 3.5–5.2)
Alkaline Phosphatase: 79 U/L (ref 39–117)
BUN: 16 mg/dL (ref 6–23)
CALCIUM: 9.1 mg/dL (ref 8.4–10.5)
CHLORIDE: 107 meq/L (ref 96–112)
CO2: 27 mEq/L (ref 19–32)
Creat: 1.24 mg/dL (ref 0.50–1.35)
GFR, Est African American: 81 mL/min
GFR, Est Non African American: 70 mL/min
Glucose, Bld: 79 mg/dL (ref 70–99)
Potassium: 3.8 mEq/L (ref 3.5–5.3)
Sodium: 139 mEq/L (ref 135–145)
Total Bilirubin: 0.4 mg/dL (ref 0.2–1.2)
Total Protein: 6.1 g/dL (ref 6.0–8.3)

## 2013-06-21 MED ORDER — MORPHINE SULFATE ER 15 MG PO TBCR: 15.0000 mg | EXTENDED_RELEASE_TABLET | Freq: Two times a day (BID) | ORAL | Status: DC

## 2013-06-21 MED ORDER — MORPHINE SULFATE 15 MG PO TABS: 15.0000 mg | ORAL_TABLET | Freq: Three times a day (TID) | ORAL | Status: DC | PRN

## 2013-06-22 LAB — T-HELPER CELL (CD4) - (RCID CLINIC ONLY)
CD4 T CELL HELPER: 8 % — AB (ref 33–55)
CD4 T Cell Abs: 210 /uL — ABNORMAL LOW (ref 400–2700)

## 2013-06-22 LAB — RPR

## 2013-06-23 LAB — HIV-1 RNA ULTRAQUANT REFLEX TO GENTYP+

## 2013-07-12 ENCOUNTER — Other Ambulatory Visit: Payer: Self-pay | Admitting: Infectious Disease

## 2013-07-13 ENCOUNTER — Other Ambulatory Visit: Payer: Self-pay | Admitting: Licensed Clinical Social Worker

## 2013-07-13 DIAGNOSIS — B20 Human immunodeficiency virus [HIV] disease: Secondary | ICD-10-CM

## 2013-07-13 MED ORDER — RITONAVIR 100 MG PO TABS: 100.0000 mg | ORAL_TABLET | Freq: Every day | ORAL | Status: DC

## 2013-07-14 ENCOUNTER — Telehealth: Payer: Self-pay | Admitting: *Deleted

## 2013-07-14 DIAGNOSIS — B20 Human immunodeficiency virus [HIV] disease: Secondary | ICD-10-CM

## 2013-07-14 MED ORDER — RITONAVIR 100 MG PO TABS: 100.0000 mg | ORAL_TABLET | Freq: Two times a day (BID) | ORAL | Status: DC

## 2013-07-14 NOTE — Telephone Encounter (Signed)
We have REALLY had a LOT of problems with them and this guy has like very little margin for error with super R virus

## 2013-07-14 NOTE — Telephone Encounter (Signed)
Question about Norvir refill from 07/13/13, refill was for only once daily.  Pt is on BID Prezista.  RN spoke with Dr. Tommy Medal.  Pt should be on BID Norvir with BID Prezista.  Order given to [employer] to increase dosage to BID with #60 tablets.

## 2013-07-19 ENCOUNTER — Encounter: Payer: Self-pay | Admitting: Infectious Disease

## 2013-07-19 ENCOUNTER — Other Ambulatory Visit: Payer: Self-pay | Admitting: *Deleted

## 2013-07-19 ENCOUNTER — Ambulatory Visit (INDEPENDENT_AMBULATORY_CARE_PROVIDER_SITE_OTHER): Payer: Medicaid Other | Admitting: Infectious Disease

## 2013-07-19 VITALS — BP 136/88 | HR 71 | Temp 98.3°F | Ht 69.0 in | Wt 119.0 lb

## 2013-07-19 DIAGNOSIS — G894 Chronic pain syndrome: Secondary | ICD-10-CM

## 2013-07-19 DIAGNOSIS — F418 Other specified anxiety disorders: Secondary | ICD-10-CM

## 2013-07-19 DIAGNOSIS — R112 Nausea with vomiting, unspecified: Secondary | ICD-10-CM

## 2013-07-19 DIAGNOSIS — G909 Disorder of the autonomic nervous system, unspecified: Secondary | ICD-10-CM

## 2013-07-19 DIAGNOSIS — G63 Polyneuropathy in diseases classified elsewhere: Secondary | ICD-10-CM

## 2013-07-19 DIAGNOSIS — F4321 Adjustment disorder with depressed mood: Secondary | ICD-10-CM

## 2013-07-19 DIAGNOSIS — B20 Human immunodeficiency virus [HIV] disease: Secondary | ICD-10-CM

## 2013-07-19 DIAGNOSIS — F341 Dysthymic disorder: Secondary | ICD-10-CM

## 2013-07-19 DIAGNOSIS — E46 Unspecified protein-calorie malnutrition: Secondary | ICD-10-CM

## 2013-07-19 MED ORDER — ENSURE PO LIQD: 237.0000 mL | Freq: Two times a day (BID) | ORAL | Status: DC

## 2013-07-19 MED ORDER — MORPHINE SULFATE ER 15 MG PO TBCR: 15.0000 mg | EXTENDED_RELEASE_TABLET | Freq: Two times a day (BID) | ORAL | Status: DC

## 2013-07-19 MED ORDER — DRONABINOL 5 MG PO CAPS: 5.0000 mg | ORAL_CAPSULE | Freq: Two times a day (BID) | ORAL | Status: DC

## 2013-07-19 MED ORDER — MORPHINE SULFATE 15 MG PO TABS: 15.0000 mg | ORAL_TABLET | Freq: Three times a day (TID) | ORAL | Status: DC | PRN

## 2013-07-19 MED ORDER — ESCITALOPRAM OXALATE 20 MG PO TABS: 20.0000 mg | ORAL_TABLET | Freq: Every day | ORAL | Status: DC

## 2013-07-19 MED ORDER — PROCHLORPERAZINE MALEATE 10 MG PO TABS: 10.0000 mg | ORAL_TABLET | Freq: Four times a day (QID) | ORAL | Status: DC | PRN

## 2013-07-19 NOTE — Patient Instructions (Signed)
One month appt with no labs, next appt for labs in 3 months

## 2013-07-19 NOTE — Progress Notes (Signed)
Subjective:    Patient ID: Brandon [name], male    DOB: 03-29-1965, 48 y.o.   MRN: [id number]  HPI   Mr Koren is a highly complicated man with HIV/AIDS and Multi-DRUG RESISTANT virus formerly followed at [facility]. Apparently he had been taken off ARV and placed with hospice.  He has been on various complicated antiretroviral regimens in the past, with unfortunate GENOTYPIC resistance to all non-nucleoside reverse transcriptase inhibitors and all NRTIs, Resistance to all protease inhibitors with the exception of Prezista which had some activity genotypically,, Resistance to Isentress, and Elvitegravir and 100X reduced S to dolutegravir having both a 148H and 140S  and with Dual tropic virus.  When he had at [facility] most recently been on a regimen of Twice daily Prezista, Norvir, twice daily AZT, once daily Viread, with these meds stopped n 02/2012. Prior to this had been on similar regimen but with isentress and maraviroc (despite IN R and dual mixed virus. At that time Dolutegravir was not available)  02/12/2010 phenotype at [facility] showed:  RT: NRTI: ABC, DDI, D4T, AZT, TDF: resistant; 3TC, FTC: susceptible; NNRTI: EFV susceptible; RPV, NVP, ETR, DLV: Resistant; PI: pan-resistant  He had decided now to go back onto ARVS and is referred to [facility].  We  Have seen him and placed him on a  salvage regimen of Prezista 600mg   Twice daily boosted with Norvir 100mg  twice daily, Tivicay twice daily, Combivir twice daily and once daily Viread.  And since then he has now an UNDETECTABLE VIRAL LOAD <20.  And CD4 >200  when las checked in April. He has maintained perfect virological suppresion for past 12 months.  He continues to suffer from chronic nausea at times worse on the weekends where he vomits at times and is not able to adjust his antiretrovirals.  He's been taking Zofran when necessary but not scheduled. On the weekend the Zofran is not on its own cable of suppressing his nausea. He does  have some suppositories for Compazine which can work but no Compazine pills. Apparently he is intolerant of Phenergan tablets.  He is on chronic opioids and certainly this could be contributing to his symptoms. He has had recent EGD.  He is also suffering quite a few symptoms related to depression. His daughter was diagnosed with breast cancer status post surgery and now receiving radiation therapy hopefully for cure. She lives in the [place] His wife has several medical problems including a torn rotator cuff. He is quite fatigued and tired and concerned about his wife's pain and her medical care. This is one of the main stressors along with anxiety about his daughter that makes him depressed. He is having trouble concentrating at work and has been Community education officer on several projects.  He feels Lexapro has helped some but is not getting as much benefit from it and he would like.  He's been talking to his pastor his church on a nearly daily basis but not seeing another psychiatrist or psychologist.  His warfarin is being managed at [facility] clinic   Review of Systems  Constitutional: Positive for fatigue. Negative for fever, chills, diaphoresis, activity change, appetite change and unexpected weight change.  HENT: Negative for congestion, rhinorrhea, sinus pressure, sneezing, sore throat and trouble swallowing.   Eyes: Negative for photophobia and visual disturbance.  Respiratory: Negative for cough, chest tightness, shortness of breath, wheezing and stridor.   Cardiovascular: Negative for chest pain, palpitations and leg swelling.  Gastrointestinal: Positive for  nausea and vomiting. Negative for diarrhea, constipation, blood in stool, abdominal distention and anal bleeding.  Genitourinary: Negative for dysuria, hematuria, flank pain and difficulty urinating.  Musculoskeletal: Negative for arthralgias, back pain, gait problem, joint swelling and myalgias.  Skin: Negative  for color change, rash and wound.  Neurological: Negative for dizziness, tremors, weakness and light-headedness.  Hematological: Negative for adenopathy. Does not bruise/bleed easily.  Psychiatric/Behavioral: Positive for dysphoric mood. Negative for behavioral problems, confusion, sleep disturbance, decreased concentration and agitation.       Objective:   Physical Exam  Constitutional: He is oriented to person, place, and time. No distress.  HENT:  Head: Normocephalic and atraumatic.  Mouth/Throat: Posterior oropharyngeal edema present. No oropharyngeal exudate.  Eyes: Conjunctivae and EOM are normal. Pupils are equal, round, and reactive to light. Right eye exhibits no discharge and no exudate. Left eye exhibits no discharge and no exudate. Right conjunctiva is not injected. Right conjunctiva has no hemorrhage. Left conjunctiva is not injected. Left conjunctiva has no hemorrhage.  Neck: Normal range of motion. Neck supple.  Cardiovascular: Regular rhythm and normal heart sounds.  Exam reveals no gallop and no friction rub.   No murmur heard. Pulmonary/Chest: Effort normal and breath sounds normal. No respiratory distress. He has no wheezes. He has no rales.  Abdominal: He exhibits no distension. There is no rebound.  Musculoskeletal: He exhibits no edema and no tenderness.  Lymphadenopathy:    He has no cervical adenopathy.  Neurological: He is alert and oriented to person, place, and time. He exhibits normal muscle tone. Coordination normal.  Skin: Skin is warm and dry. No erythema. No pallor.  Psychiatric: His behavior is normal. Judgment and thought content normal. His mood appears not anxious. He does not exhibit a depressed mood.          Assessment & Plan:   #1 HIV: Highly Resistant Virus.  Continue the  --Tivicay 50mg  BID --Prezista 600mg  BID with  --Norvir 100mg  BID --Combivir 1 tablet BID --Viread q daily  HE SHOULD NOT TAKE ENSURE AT THE SAME TIME AS HIS  TIVICAY!!!!  OI prophylaxis: continue dapsone for now but if CD4 still above 200,  will discontinue it  Depression: Increased dose of Lexapro to 20 mg per day. Meet with Grayland Ormond for counseling  I spent greater than 40 minutes with the patient including greater than 50% of time in face to face counsel of the patient and in coordination of their care.  Nausea: take schedule Zofran and try Compazine if it does not respond to the Zofran.  Pain: See both MS Contin and short-acting MSIR    AVR: mechanical on coumadin followed at [facility]. I will check INR for his clinic as courtesy

## 2013-08-04 ENCOUNTER — Telehealth: Payer: Self-pay | Admitting: *Deleted

## 2013-08-04 NOTE — Telephone Encounter (Signed)
Patient called and advised that since 9 am this morning he has had vomiting every 30 minutes or so and can not keep anything on his stomach. He advised he also has severe stomach pains, chills and sweats. He advised he will not go to the ED has had some bad experiences there and just wants to be seen. Advised his wife to make sure he does not get dehydrated and try to get him to go to the ED. She advised she has been trying all day and he refuses. Advised her this could be something serious and he really needs to be seen. She advised he will not go unless the doctor tells him he needs to go. Gave her an appt for 08/05/13 to see Dr Baxter Flattery and advised her if something changes she needs to get him to the ED asap. Also advised her will send Dr Tommy Medal a message to see if he feels he needs to call the patient and try to get him to the ED today or if he can wait until tomorrow.

## 2013-08-05 ENCOUNTER — Encounter (HOSPITAL_COMMUNITY): Payer: Self-pay

## 2013-08-05 ENCOUNTER — Encounter: Payer: Self-pay | Admitting: Internal Medicine

## 2013-08-05 ENCOUNTER — Ambulatory Visit (INDEPENDENT_AMBULATORY_CARE_PROVIDER_SITE_OTHER): Payer: Medicaid Other | Admitting: Internal Medicine

## 2013-08-05 ENCOUNTER — Inpatient Hospital Stay (HOSPITAL_COMMUNITY)
Admission: AD | Admit: 2013-08-05 | Discharge: 2013-08-05 | DRG: 392 | Discharge status: Against Medical Advice | Payer: Medicaid Other | Source: Ambulatory Visit | Attending: Family Medicine | Admitting: Family Medicine

## 2013-08-05 VITALS — BP 150/101 | HR 117 | Temp 99.1°F | Wt 109.0 lb

## 2013-08-05 DIAGNOSIS — Z8042 Family history of malignant neoplasm of prostate: Secondary | ICD-10-CM

## 2013-08-05 DIAGNOSIS — F17200 Nicotine dependence, unspecified, uncomplicated: Secondary | ICD-10-CM | POA: Diagnosis present

## 2013-08-05 DIAGNOSIS — R112 Nausea with vomiting, unspecified: Secondary | ICD-10-CM

## 2013-08-05 DIAGNOSIS — Z79899 Other long term (current) drug therapy: Secondary | ICD-10-CM

## 2013-08-05 DIAGNOSIS — Z954 Presence of other heart-valve replacement: Secondary | ICD-10-CM

## 2013-08-05 DIAGNOSIS — Z91038 Other insect allergy status: Secondary | ICD-10-CM

## 2013-08-05 DIAGNOSIS — Z21 Asymptomatic human immunodeficiency virus [HIV] infection status: Secondary | ICD-10-CM | POA: Diagnosis present

## 2013-08-05 DIAGNOSIS — Z8673 Personal history of transient ischemic attack (TIA), and cerebral infarction without residual deficits: Secondary | ICD-10-CM

## 2013-08-05 DIAGNOSIS — Z882 Allergy status to sulfonamides status: Secondary | ICD-10-CM

## 2013-08-05 DIAGNOSIS — K529 Noninfective gastroenteritis and colitis, unspecified: Secondary | ICD-10-CM | POA: Diagnosis present

## 2013-08-05 DIAGNOSIS — Z833 Family history of diabetes mellitus: Secondary | ICD-10-CM

## 2013-08-05 DIAGNOSIS — K861 Other chronic pancreatitis: Secondary | ICD-10-CM | POA: Diagnosis present

## 2013-08-05 DIAGNOSIS — I10 Essential (primary) hypertension: Secondary | ICD-10-CM | POA: Diagnosis present

## 2013-08-05 DIAGNOSIS — Z881 Allergy status to other antibiotic agents status: Secondary | ICD-10-CM

## 2013-08-05 DIAGNOSIS — A6000 Herpesviral infection of urogenital system, unspecified: Secondary | ICD-10-CM | POA: Diagnosis present

## 2013-08-05 DIAGNOSIS — F329 Major depressive disorder, single episode, unspecified: Secondary | ICD-10-CM | POA: Diagnosis present

## 2013-08-05 DIAGNOSIS — F32A Depression, unspecified: Secondary | ICD-10-CM | POA: Diagnosis present

## 2013-08-05 DIAGNOSIS — R197 Diarrhea, unspecified: Secondary | ICD-10-CM

## 2013-08-05 DIAGNOSIS — Z952 Presence of prosthetic heart valve: Secondary | ICD-10-CM

## 2013-08-05 DIAGNOSIS — Z8249 Family history of ischemic heart disease and other diseases of the circulatory system: Secondary | ICD-10-CM

## 2013-08-05 DIAGNOSIS — K219 Gastro-esophageal reflux disease without esophagitis: Secondary | ICD-10-CM | POA: Diagnosis present

## 2013-08-05 DIAGNOSIS — Z888 Allergy status to other drugs, medicaments and biological substances status: Secondary | ICD-10-CM

## 2013-08-05 DIAGNOSIS — F3289 Other specified depressive episodes: Secondary | ICD-10-CM | POA: Diagnosis present

## 2013-08-05 DIAGNOSIS — F1210 Cannabis abuse, uncomplicated: Secondary | ICD-10-CM | POA: Diagnosis present

## 2013-08-05 DIAGNOSIS — Z9089 Acquired absence of other organs: Secondary | ICD-10-CM

## 2013-08-05 DIAGNOSIS — A088 Other specified intestinal infections: Principal | ICD-10-CM | POA: Diagnosis present

## 2013-08-05 DIAGNOSIS — Z800 Family history of malignant neoplasm of digestive organs: Secondary | ICD-10-CM

## 2013-08-05 DIAGNOSIS — E860 Dehydration: Secondary | ICD-10-CM

## 2013-08-05 LAB — PROTIME-INR
INR: 1.97 — AB (ref 0.00–1.49)
PROTHROMBIN TIME: 21.8 s — AB (ref 11.6–15.2)

## 2013-08-05 LAB — COMPREHENSIVE METABOLIC PANEL
ALT: 12 U/L (ref 0–53)
AST: 16 U/L (ref 0–37)
Albumin: 4.2 g/dL (ref 3.5–5.2)
Alkaline Phosphatase: 97 U/L (ref 39–117)
BUN: 14 mg/dL (ref 6–23)
CALCIUM: 9.5 mg/dL (ref 8.4–10.5)
CO2: 26 meq/L (ref 19–32)
CREATININE: 1.19 mg/dL (ref 0.50–1.35)
Chloride: 105 mEq/L (ref 96–112)
GFR, EST AFRICAN AMERICAN: 83 mL/min — AB (ref >90)
GFR, EST NON AFRICAN AMERICAN: 72 mL/min — AB (ref >90)
GLUCOSE: 101 mg/dL — AB (ref 70–99)
Potassium: 3.9 mEq/L (ref 3.7–5.3)
Sodium: 144 mEq/L (ref 137–147)
TOTAL PROTEIN: 7.6 g/dL (ref 6.0–8.3)
Total Bilirubin: 0.9 mg/dL (ref 0.3–1.2)

## 2013-08-05 LAB — MAGNESIUM: MAGNESIUM: 1.8 mg/dL (ref 1.5–2.5)

## 2013-08-05 LAB — CBC WITH DIFFERENTIAL/PLATELET
BASOS ABS: 0 10*3/uL (ref 0.0–0.1)
Basophils Relative: 0 % (ref 0–1)
Eosinophils Absolute: 0 10*3/uL (ref 0.0–0.7)
Eosinophils Relative: 0 % (ref 0–5)
HCT: 40.3 % (ref 39.0–52.0)
Hemoglobin: 14.7 g/dL (ref 13.0–17.0)
LYMPHS ABS: 1.9 10*3/uL (ref 0.7–4.0)
LYMPHS PCT: 23 % (ref 12–46)
MCH: 39.7 pg — ABNORMAL HIGH (ref 26.0–34.0)
MCHC: 36.5 g/dL — ABNORMAL HIGH (ref 30.0–36.0)
MCV: 108.9 fL — ABNORMAL HIGH (ref 78.0–100.0)
Monocytes Absolute: 0.6 10*3/uL (ref 0.1–1.0)
Monocytes Relative: 8 % (ref 3–12)
NEUTROS ABS: 5.9 10*3/uL (ref 1.7–7.7)
NEUTROS PCT: 69 % (ref 43–77)
PLATELETS: 171 10*3/uL (ref 150–400)
RBC: 3.7 MIL/uL — ABNORMAL LOW (ref 4.22–5.81)
RDW: 12.9 % (ref 11.5–15.5)
WBC: 8.4 10*3/uL (ref 4.0–10.5)

## 2013-08-05 LAB — PHOSPHORUS: PHOSPHORUS: 2.6 mg/dL (ref 2.3–4.6)

## 2013-08-05 LAB — LIPASE, BLOOD: Lipase: 21 U/L (ref 11–59)

## 2013-08-05 MED ORDER — DAPSONE 100 MG PO TABS
100.0000 mg | ORAL_TABLET | Freq: Every day | ORAL | Status: DC
  Administered 2013-08-05: 100 mg via ORAL
  Filled 2013-08-05: qty 1

## 2013-08-05 MED ORDER — PANTOPRAZOLE SODIUM 40 MG IV SOLR
40.0000 mg | INTRAVENOUS | Status: DC
  Administered 2013-08-05: 40 mg via INTRAVENOUS
  Filled 2013-08-05: qty 40

## 2013-08-05 MED ORDER — MORPHINE SULFATE ER 15 MG PO TBCR
15.0000 mg | EXTENDED_RELEASE_TABLET | Freq: Two times a day (BID) | ORAL | Status: DC
  Filled 2013-08-05: qty 1

## 2013-08-05 MED ORDER — VALACYCLOVIR HCL 500 MG PO TABS
1000.0000 mg | ORAL_TABLET | Freq: Every day | ORAL | Status: DC
  Administered 2013-08-05: 1000 mg via ORAL
  Filled 2013-08-05: qty 2

## 2013-08-05 MED ORDER — RITONAVIR 100 MG PO TABS
100.0000 mg | ORAL_TABLET | Freq: Two times a day (BID) | ORAL | Status: DC
  Administered 2013-08-05: 100 mg via ORAL
  Filled 2013-08-05 (×2): qty 1

## 2013-08-05 MED ORDER — LAMIVUDINE-ZIDOVUDINE 150-300 MG PO TABS
1.0000 | ORAL_TABLET | Freq: Two times a day (BID) | ORAL | Status: DC
  Administered 2013-08-05: 1 via ORAL
  Filled 2013-08-05 (×2): qty 1

## 2013-08-05 MED ORDER — WARFARIN SODIUM 6 MG PO TABS
6.0000 mg | ORAL_TABLET | Freq: Once | ORAL | Status: AC
  Administered 2013-08-05: 6 mg via ORAL
  Filled 2013-08-05: qty 1

## 2013-08-05 MED ORDER — DARUNAVIR ETHANOLATE 800 MG PO TABS
800.0000 mg | ORAL_TABLET | Freq: Every day | ORAL | Status: DC
  Filled 2013-08-05: qty 1

## 2013-08-05 MED ORDER — ZOLPIDEM TARTRATE 10 MG PO TABS: 10.0000 mg | ORAL_TABLET | Freq: Every evening | ORAL | Status: DC | PRN

## 2013-08-05 MED ORDER — ENSURE COMPLETE PO LIQD: 237.0000 mL | Freq: Two times a day (BID) | ORAL | Status: DC

## 2013-08-05 MED ORDER — DOLUTEGRAVIR SODIUM 50 MG PO TABS
50.0000 mg | ORAL_TABLET | Freq: Two times a day (BID) | ORAL | Status: DC
  Administered 2013-08-05: 50 mg via ORAL
  Filled 2013-08-05 (×2): qty 1

## 2013-08-05 MED ORDER — ESCITALOPRAM OXALATE 20 MG PO TABS
20.0000 mg | ORAL_TABLET | Freq: Every day | ORAL | Status: DC
  Administered 2013-08-05: 20 mg via ORAL
  Filled 2013-08-05: qty 1

## 2013-08-05 MED ORDER — OXYCODONE HCL 5 MG PO TABS: 10.0000 mg | ORAL_TABLET | ORAL | Status: DC | PRN

## 2013-08-05 MED ORDER — ONDANSETRON 8 MG/NS 50 ML IVPB
8.0000 mg | Freq: Three times a day (TID) | INTRAVENOUS | Status: DC | PRN
  Filled 2013-08-05 (×2): qty 8

## 2013-08-05 MED ORDER — LORAZEPAM 1 MG PO TABS: 1.0000 mg | ORAL_TABLET | Freq: Three times a day (TID) | ORAL | Status: DC | PRN

## 2013-08-05 MED ORDER — HYDRALAZINE HCL 20 MG/ML IJ SOLN
10.0000 mg | Freq: Four times a day (QID) | INTRAMUSCULAR | Status: DC | PRN
  Administered 2013-08-05: 10 mg via INTRAVENOUS
  Filled 2013-08-05: qty 0.5

## 2013-08-05 MED ORDER — ONDANSETRON HCL 4 MG PO TABS: 8.0000 mg | ORAL_TABLET | Freq: Three times a day (TID) | ORAL | Status: DC | PRN

## 2013-08-05 MED ORDER — DEXTROSE-NACL 5-0.9 % IV SOLN
INTRAVENOUS | Status: DC
  Administered 2013-08-05: 14:00:00 via INTRAVENOUS

## 2013-08-05 MED ORDER — DRONABINOL 5 MG PO CAPS
5.0000 mg | ORAL_CAPSULE | Freq: Two times a day (BID) | ORAL | Status: DC
  Administered 2013-08-05: 5 mg via ORAL
  Filled 2013-08-05: qty 1

## 2013-08-05 MED ORDER — BIOTENE DRY MOUTH MT LIQD: 15.0000 mL | Freq: Two times a day (BID) | OROMUCOSAL | Status: DC

## 2013-08-05 MED ORDER — HEPARIN (PORCINE) IN NACL 100-0.45 UNIT/ML-% IJ SOLN
1200.0000 [IU]/h | INTRAMUSCULAR | Status: DC
  Administered 2013-08-05: 1200 [IU]/h via INTRAVENOUS
  Filled 2013-08-05: qty 250

## 2013-08-05 MED ORDER — PROCHLORPERAZINE MALEATE 5 MG PO TABS
5.0000 mg | ORAL_TABLET | Freq: Three times a day (TID) | ORAL | Status: DC | PRN
  Filled 2013-08-05: qty 1

## 2013-08-05 MED ORDER — MORPHINE SULFATE 4 MG/ML IJ SOLN
4.0000 mg | INTRAMUSCULAR | Status: DC | PRN
  Administered 2013-08-05 (×2): 4 mg via INTRAVENOUS
  Filled 2013-08-05 (×2): qty 1

## 2013-08-05 MED ORDER — TENOFOVIR DISOPROXIL FUMARATE 300 MG PO TABS
300.0000 mg | ORAL_TABLET | Freq: Every day | ORAL | Status: DC
  Administered 2013-08-05: 300 mg via ORAL
  Filled 2013-08-05: qty 1

## 2013-08-05 MED ORDER — ONDANSETRON HCL 4 MG PO TABS: 4.0000 mg | ORAL_TABLET | Freq: Four times a day (QID) | ORAL | Status: DC | PRN

## 2013-08-05 MED ORDER — ADULT MULTIVITAMIN W/MINERALS CH
1.0000 | ORAL_TABLET | Freq: Every day | ORAL | Status: DC
  Administered 2013-08-05: 1 via ORAL
  Filled 2013-08-05: qty 1

## 2013-08-05 MED ORDER — MORPHINE SULFATE 2 MG/ML IJ SOLN
2.0000 mg | INTRAMUSCULAR | Status: DC | PRN
  Administered 2013-08-05: 2 mg via INTRAVENOUS
  Filled 2013-08-05: qty 1

## 2013-08-05 MED ORDER — ONDANSETRON HCL 4 MG/2ML IJ SOLN
4.0000 mg | Freq: Four times a day (QID) | INTRAMUSCULAR | Status: DC | PRN
  Administered 2013-08-05: 4 mg via INTRAVENOUS
  Filled 2013-08-05: qty 2

## 2013-08-05 MED ORDER — MORPHINE SULFATE 15 MG PO TABS: 15.0000 mg | ORAL_TABLET | Freq: Three times a day (TID) | ORAL | Status: DC | PRN

## 2013-08-05 MED ORDER — HEPARIN BOLUS VIA INFUSION
2000.0000 [IU] | INTRAVENOUS | Status: AC
  Administered 2013-08-05: 2000 [IU] via INTRAVENOUS
  Filled 2013-08-05: qty 2000

## 2013-08-05 MED ORDER — WARFARIN - PHARMACIST DOSING INPATIENT: Freq: Every day | Status: DC

## 2013-08-05 NOTE — Progress Notes (Signed)
ANTICOAGULATION CONSULT NOTE - Initial Consult  Pharmacy Consult for warfarin, heparin Indication: mechanical prosthetic aortic valve  Allergies  Allergen Reactions  . Bactrim [Sulfamethoxazole-Trimethoprim]   . Bee Venom Anaphylaxis  . Sulfa Antibiotics Anaphylaxis  . Truvada [Emtricitabine-Tenofovir] Anaphylaxis    Takes plain tenofovir at home  . Lidoderm [Lidocaine] Other (See Comments)    Reaction unknown  . Raltegravir     resistance  . Sulfamethoxazole Itching  . Ceftriaxone Rash    Patient Measurements:     Vital Signs: Temp: 98.3 F (36.8 C) (05/28 1123) Temp src: Oral (05/28 1123) BP: 168/112 mmHg (05/28 1123) Pulse Rate: 97 (05/28 1123)  Labs:  Recent Labs  08/05/13 1305  HGB 14.7  HCT 40.3  PLT 171    The CrCl is unknown because both a height and weight (above a minimum accepted value) are required for this calculation.   Medical History: Past Medical History  Diagnosis Date  . HIV (human immunodeficiency virus infection)   . Hypertension   . Stroke   . Pancreatitis   . Mechanical heart valve present   . Arthritis   . Anemia   . GERD (gastroesophageal reflux disease)   . Abdominal pain   . Weight loss, unintentional   . Constipation   . Nausea & vomiting   . Diarrhea     Medications:  Scheduled:  . dapsone  100 mg Oral Daily  . [START ON 08/06/2013] darunavir  800 mg Oral Q breakfast  . dolutegravir  50 mg Oral BID  . dronabinol  5 mg Oral BID AC  . escitalopram  20 mg Oral Daily  . feeding supplement (ENSURE COMPLETE)  237 mL Oral BID BM  . heparin  2,000 Units Intravenous STAT  . lamiVUDine-zidovudine  1 tablet Oral BID  . morphine  15 mg Oral BID  . multivitamin with minerals  1 tablet Oral Daily  . pantoprazole (PROTONIX) IV  40 mg Intravenous Q24H  . ritonavir  100 mg Oral BID WC  . tenofovir  300 mg Oral Daily  . valACYclovir  1,000 mg Oral Daily  . warfarin  6 mg Oral ONCE-1800  . Warfarin - Pharmacist Dosing Inpatient    Does not apply q1800   Infusions:  . dextrose 5 % and 0.9% NaCl 75 mL/hr at 08/05/13 1346  . heparin     PRN: hydrALAZINE, LORazepam, morphine, morphine injection, ondansetron (ZOFRAN) IV, ondansetron, zolpidem  Assessment: 48 y/o M admitted with suspected viral gastroenteritis.  PMH includes HIV (on ART regimen per [facility]) and mechanical prosthetic aortic valve (on chronic warfarin per [facility]).  Pt has reportedly been unable to keep food or medications down for the past 1-1/2 days or so due to vomiting.   Pharmacy was asked to assist with inpatient warfarin dosing, and to add heparin infusion if INR is subtherapeutic.    INR subtherapeutic on admission (1.97)   Goal of Therapy:  INR 2.5 - 3.5 (per [facility] Records) Heparin level 0.3 - 0.7 while INR subtherapeutic. Monitor platelets by anticoagulation protocol: Yes   Plan:  1. Heparin 2000 units IV x 1 2. Heparin 1200 units/hr IV infusion\ 3. Heparin level at 9 pm 4. Warfarin 6 mg PO x 1 today at 1800 5. Daily heparin level, PT/INR, CBC  Clayburn Pert, PharmD, BCPS Pager: [phone] 08/05/2013  1:58 PM

## 2013-08-05 NOTE — Progress Notes (Signed)
Subjective:    Patient ID: Brandon [name], male    DOB: [birth year], 48 y.o.   MRN: [id number]  HPI Brandon [name] is a 89 M with HIV, CD 4 count of 210(8%)/VL<20 on salvage regimen of DLG BID/DRVr BID/combivir BID/TDF. He has had acute onset of chills, profuse diaphoresis, N/V/D for the past 48hrs. Now having bilious non-bloody emesis. Unable to keep any food or liquid down for 2 days Denies any sick contacts. No bad food.   Allergies  Allergen Reactions  . Bactrim [Sulfamethoxazole-Trimethoprim]   . Bee Venom Anaphylaxis  . Sulfa Antibiotics Anaphylaxis  . Truvada [Emtricitabine-Tenofovir] Anaphylaxis    Takes plain tenofovir at home  . Lidoderm [Lidocaine] Other (See Comments)    Reaction unknown  . Raltegravir     resistance  . Sulfamethoxazole Itching  . Ceftriaxone Rash     Current Outpatient Prescriptions on File Prior to Visit  Medication Sig Dispense Refill  . Clobetasol Propionate 0.05 % lotion Apply 1 application topically 2 (two) times daily as needed (for rash).      . dapsone 100 MG tablet TAKE 1 TABLET (100 MG TOTAL) BY MOUTH DAILY.  30 tablet  6  . dicyclomine (BENTYL) 20 MG tablet TAKE 1 TABLET (20 MG TOTAL) BY MOUTH THREE TIMES DAILY.  90 tablet  0  . dronabinol (MARINOL) 5 MG capsule Take 1 capsule (5 mg total) by mouth 2 (two) times daily before a meal.  60 capsule  4  . ENSURE (ENSURE) Take 237 mLs by mouth 2 (two) times daily between meals. Provide a case per month: BMI<20  237 mL  6  . EPINEPHrine (EPI-PEN) 0.3 mg/0.3 mL DEVI Inject 0.3 mLs (0.3 mg total) into the muscle once.  1 Device  1  . escitalopram (LEXAPRO) 20 MG tablet Take 1 tablet (20 mg total) by mouth daily.  30 tablet  11  . flunisolide (NASALIDE) 25 MCG/ACT (0.025%) SOLN Place 2 sprays into the nose at bedtime as needed.  1 Bottle  0  . lamiVUDine-zidovudine (COMBIVIR) 150-300 MG per tablet TAKE 1 TABLET BY MOUTH TWO TIMES DAILY.  60 tablet  6  . LORazepam (ATIVAN) 1 MG tablet Take 1 tablet (1  mg total) by mouth every 8 (eight) hours as needed for anxiety.  90 tablet  3  . megestrol (MEGACE) 40 MG/ML suspension TAKE 20 MLS (800 MG TOTAL) BY MOUTH DAILY.  240 mL  1  . morphine (MS CONTIN) 15 MG 12 hr tablet Take 1 tablet (15 mg total) by mouth 2 (two) times daily.  60 tablet  0  . morphine (MSIR) 15 MG tablet Take 1 tablet (15 mg total) by mouth 3 (three) times daily as needed.  90 tablet  0  . ondansetron (ZOFRAN-ODT) 8 MG disintegrating tablet TAKE 1 TABLET (8 MG TOTAL) BY MOUTH EVERY EIGHT HOURS AS NEEDED FOR NAUSEA.  30 tablet  1  . pantoprazole (PROTONIX) 40 MG tablet TAKE 1 TABLET (40 MG TOTAL) BY MOUTH DAILY.  30 tablet  6  . PREZISTA 600 MG tablet TAKE 1 TABLET (600 MG TOTAL) BY MOUTH TWO TIMES DAILY WITH A MEAL.  60 tablet  6  . prochlorperazine (COMPAZINE) 10 MG tablet Take 1 tablet (10 mg total) by mouth every 6 (six) hours as needed for nausea or vomiting.  120 tablet  4  . prochlorperazine (COMPAZINE) 25 MG suppository Place 1 suppository (25 mg total) rectally every 12 (twelve) hours as needed for nausea.  12 suppository  1  . ritonavir (NORVIR) 100 MG TABS tablet Take 1 tablet (100 mg total) by mouth 2 (two) times daily with a meal.  60 tablet  11  . TIVICAY 50 MG tablet TAKE 1 TABLET (50 MG TOTAL) BY MOUTH TWO TIMES DAILY.  60 tablet  5  . valACYclovir (VALTREX) 1000 MG tablet Take 1 tablet (1,000 mg total) by mouth daily.  30 tablet  0  . VIREAD 300 MG tablet TAKE 1 TABLET (300 MG TOTAL) BY MOUTH DAILY.  30 tablet  6  . warfarin (COUMADIN) 1 MG tablet TAKE 1 TABLET (1 MG TOTAL) BY MOUTH DAILY. TAKE AS DIRECTED WITH 5MG  TABLET PER COUMADIN CLINIC  30 tablet  0  . warfarin (COUMADIN) 5 MG tablet Take 2.5-5 mg by mouth daily. Alternating days      . warfarin (COUMADIN) 5 MG tablet TAKE 1 TABLET (5 MG TOTAL) BY MOUTH DAILY.  30 tablet  0  . zolpidem (AMBIEN) 10 MG tablet Take 1 tablet (10 mg total) by mouth at bedtime as needed for sleep. Take 1 tablet (10 mg total) by mouth  nightly as needed for Sleep. May repeat times one as needed  30 tablet  3   Current Facility-Administered Medications on File Prior to Visit  Medication Dose Route Frequency Provider Last Rate Last Dose  . 0.9 %  sodium chloride infusion   Intravenous Once Campbell Riches, MD       Active Ambulatory Problems    Diagnosis Date Noted  . Acute pancreatitis 02/09/2012  . Nausea & vomiting 02/09/2012  . Abdominal pain 02/09/2012  . Anemia 02/09/2012  . S/P AVR (aortic valve replacement) 02/09/2012  . Septic arthritis of knee 02/09/2012  . Warfarin-induced coagulopathy 02/09/2012  . AIDS 02/09/2012  . Chronic pancreatitis 02/20/2012  . Hypokalemia 02/20/2012  . Adjustment disorder with mixed anxiety and depressed mood 05/14/2012  . HTN (hypertension) 05/14/2012  . Genital herpes 05/14/2012  . Long term (current) use of anticoagulants 05/14/2012  . cerebral artey occ 05/14/2012  . Unspecified cerebral artery occlusion with cerebral infarction 05/14/2012  . Candida esophagitis 07/23/2012  . Intractable nausea and vomiting 07/23/2012  . Odynophagia 07/24/2012  . Persistent vomiting 07/24/2012  . Depression 03/17/2013  . Sinus congestion 03/17/2013   Resolved Ambulatory Problems    Diagnosis Date Noted  . No Resolved Ambulatory Problems   Past Medical History  Diagnosis Date  . HIV (human immunodeficiency virus infection)   . Hypertension   . Stroke   . Pancreatitis   . Mechanical heart valve present   . Arthritis   . GERD (gastroesophageal reflux disease)   . Weight loss, unintentional   . Constipation   . Diarrhea      Review of Systems Review of Systems  Constitutional: positive for fever, chills, diaphoresis, activity change, appetite change, fatigue and unexpected weight change. Loss #10 in the last 2 wk HENT: Negative for congestion, sore throat, rhinorrhea, sneezing, trouble swallowing and sinus pressure.  Eyes: Negative for photophobia and visual disturbance.    Respiratory: Negative for cough, chest tightness, shortness of breath, wheezing and stridor.  Cardiovascular: Negative for chest pain, palpitations and leg swelling.  Gastrointestinal: positive forfor nausea, vomiting, abdominal pain, diarrhea, and negative for constipation, blood in stool, abdominal distention and anal bleeding.  Genitourinary: Negative for dysuria, hematuria, flank pain and difficulty urinating.  Musculoskeletal: Negative for myalgias, back pain, joint swelling, arthralgias and gait problem.  Skin: Negative for color change, pallor, rash and wound.  Neurological: Negative for dizziness, tremors, weakness and light-headedness.  Hematological: Negative for adenopathy. Does not bruise/bleed easily.  Psychiatric/Behavioral: Negative for behavioral problems, confusion, sleep disturbance, dysphoric mood, decreased concentration and agitation.       Objective:   Physical Exam BP 150/101  Pulse 117  Temp(Src) 99.1 F (37.3 C) (Oral)  Wt 109 lb (49.442 kg) Physical Exam  Constitutional: He is oriented to person, place, and time. . Cachetic ill appearing, gaunt HENT: pale conjunctiva, dry mucosa. Mild distress. Frequent hiccups Mouth/Throat: Oropharynx is clear and moist. No oropharyngeal exudate.  Cardiovascular: tachy, regular rhythm and normal heart sounds. Exam reveals no gallop and no friction rub.  No murmur heard.  Pulmonary/Chest: Effort normal and breath sounds normal. No respiratory distress. He has no wheezes.  Abdominal: Scaphoid.  Bowel sounds are normal. He exhibits no distension. Epigastric tenderness Lymphadenopathy:  He has no cervical adenopathy.  Neurological: He is alert and oriented to person, place, and time.  Skin: Skin is warm and dry. No rash noted. No erythema.  Psychiatric: He has a normal mood and affect. His behavior is normal.       Assessment & Plan:  Gastroenteritis = patient is likely having viral infection but has not had any intake in  2 days due to symptoms. Concern of having dehydration, aki, electrolyte imbalance. Will pursue admitting for observation and management. We have started ivf and give anti-emetics in clinic  hiv = will hold meds until gastroenteritis resolves so that he is able to absorb his hiv meds.  oi proph = dapsone, continue on hold until he can keep meds down in light of   Intractable hiccups = unclear if due to recent viral gastroenteritis  Spent greater than 50% of 45 min in coordination of care, direct patient care for dehydration

## 2013-08-05 NOTE — Progress Notes (Unsigned)
Patient ID: Brandon [name], male   DOB: [birth year], 48 y.o.   MRN: [id number]  Direct admission from Dr. Storm Frisk office. Possible gastroenteritis, pt has HIV, CD 4# 210. Dehydration noted on exam. Admission to med floor requested for further monitoring and hydration.  Faye Ramsay, MD  [facility] Pager [phone]  If 7PM-7AM, please contact night-coverage [email or web address] Password [email or web address]

## 2013-08-05 NOTE — H&P (Signed)
[facility] History and Physical  Brandon [name] FWY:[id number] DOB: [birth year] DOA: 08/05/2013  Referring physician: Dr. Graylon Good PCP: [name], MD   Chief Complaint: N/V and diarrhea  HPI: Brandon [name] is a 48 y.o. male  With history of HIV, aortic valve replacement on Coumadin, chronic pancreatitis. Who presents with two-day complaint of nausea vomiting and diarrhea. He denies any bloody emesis. No bloody diarrhea.  Nothing he is aware of makes it better. He has been unable to take his oral medications as a result. He presented to his infectious disease specialist who recommended inpatient evaluation. Patient was directly admitted for further workup.   Review of Systems:  Constitutional:  No weight loss, night sweats, Fevers, chills, fatigue.  HEENT:  No headaches, Difficulty swallowing,Tooth/dental problems,Sore throat,  No sneezing, itching, ear ache, nasal congestion, post nasal drip,  Cardio-vascular:  No chest pain, Orthopnea, PND, swelling in lower extremities, anasarca, dizziness, palpitations  GI:  No heartburn, + indigestion, + abdominal pain, + nausea, + vomiting, + diarrhea, + change in bowel habits, + loss of appetite  Resp:  No shortness of breath with exertion or at rest. No excess mucus, no productive cough, No non-productive cough, No coughing up of blood.No change in color of mucus.No wheezing.No chest wall deformity  Skin:  no rash or lesions.  GU:  no dysuria, change in color of urine, no urgency or frequency. No flank pain.  Musculoskeletal:  No joint pain or swelling. No decreased range of motion. No back pain.  Psych:  No change in mood or affect. No depression or anxiety. No memory loss.   Past Medical History  Diagnosis Date  . HIV (human immunodeficiency virus infection)   . Hypertension   . Stroke   . Pancreatitis   . Mechanical heart valve present   . Arthritis   . Anemia   . GERD (gastroesophageal reflux disease)   .  Abdominal pain   . Weight loss, unintentional   . Constipation   . Nausea & vomiting   . Diarrhea    Past Surgical History  Procedure Laterality Date  . Cardiac surgery    . Knee surgery    . Cholecystectomy  02/12/2012    Procedure: LAPAROSCOPIC CHOLECYSTECTOMY;  Surgeon: Stark Klein, MD;  Location: [place];  Service: General;  Laterality: N/A;  . Esophagogastroduodenoscopy N/A 07/24/2012    Procedure: ESOPHAGOGASTRODUODENOSCOPY (EGD);  Surgeon: Beryle Beams, MD;  Location: [facility] ENDOSCOPY;  Service: Endoscopy;  Laterality: N/A;  . Aortic valve replacement     Social History:  reports that he has been smoking.  He has never used smokeless tobacco. He reports that he drinks alcohol. He reports that he uses illicit drugs (Marijuana) about 7 times per week.  Allergies  Allergen Reactions  . Bactrim [Sulfamethoxazole-Trimethoprim]   . Bee Venom Anaphylaxis  . Sulfa Antibiotics Anaphylaxis  . Truvada [Emtricitabine-Tenofovir] Anaphylaxis    Takes plain tenofovir at home  . Lidoderm [Lidocaine] Other (See Comments)    Reaction unknown  . Raltegravir     resistance  . Sulfamethoxazole Itching  . Ceftriaxone Rash    Family History  Problem Relation Age of Onset  . Hypertension Father   . Cancer - Prostate Father   . Cancer Father     stomach  . Hypertension Sister   . Diabetes Maternal Aunt   . Cancer - Other Cousin   . Parkinson's disease Paternal Aunt      Prior to Admission medications   Medication  Sig Start Date End Date Taking? Authorizing Provider  Clobetasol Propionate 0.05 % lotion Apply 1 application topically 2 (two) times daily as needed (for rash).    Historical Provider, MD  dapsone 100 MG tablet TAKE 1 TABLET (100 MG TOTAL) BY MOUTH DAILY. 05/21/13   Truman Hayward, MD  dicyclomine (BENTYL) 20 MG tablet TAKE 1 TABLET (20 MG TOTAL) BY MOUTH THREE TIMES DAILY. 08/18/12   Truman Hayward, MD  dronabinol (MARINOL) 5 MG capsule Take 1 capsule (5 mg total) by  mouth 2 (two) times daily before a meal. 07/19/13   Truman Hayward, MD  ENSURE (ENSURE) Take 237 mLs by mouth 2 (two) times daily between meals. Provide a case per month: BMI<20 07/19/13   Truman Hayward, MD  EPINEPHrine (EPI-PEN) 0.3 mg/0.3 mL DEVI Inject 0.3 mLs (0.3 mg total) into the muscle once. 08/20/12   Truman Hayward, MD  escitalopram (LEXAPRO) 20 MG tablet Take 1 tablet (20 mg total) by mouth daily. 07/19/13   Truman Hayward, MD  flunisolide (NASALIDE) 25 MCG/ACT (0.025%) SOLN Place 2 sprays into the nose at bedtime as needed. 03/17/13   Truman Hayward, MD  lamiVUDine-zidovudine (COMBIVIR) 150-300 MG per tablet TAKE 1 TABLET BY MOUTH TWO TIMES DAILY. 05/21/13   Truman Hayward, MD  LORazepam (ATIVAN) 1 MG tablet Take 1 tablet (1 mg total) by mouth every 8 (eight) hours as needed for anxiety. 04/08/13   Truman Hayward, MD  megestrol (MEGACE) 40 MG/ML suspension TAKE 20 MLS (800 MG TOTAL) BY MOUTH DAILY.    Truman Hayward, MD  morphine (MS CONTIN) 15 MG 12 hr tablet Take 1 tablet (15 mg total) by mouth 2 (two) times daily. 07/19/13   Truman Hayward, MD  morphine (MSIR) 15 MG tablet Take 1 tablet (15 mg total) by mouth 3 (three) times daily as needed. 07/19/13   Truman Hayward, MD  ondansetron (ZOFRAN-ODT) 8 MG disintegrating tablet TAKE 1 TABLET (8 MG TOTAL) BY MOUTH EVERY EIGHT HOURS AS NEEDED FOR NAUSEA.    Truman Hayward, MD  pantoprazole (PROTONIX) 40 MG tablet TAKE 1 TABLET (40 MG TOTAL) BY MOUTH DAILY. 05/21/13   Truman Hayward, MD  PREZISTA 600 MG tablet TAKE 1 TABLET (600 MG TOTAL) BY MOUTH TWO TIMES DAILY WITH A MEAL. 05/21/13   Truman Hayward, MD  prochlorperazine (COMPAZINE) 10 MG tablet Take 1 tablet (10 mg total) by mouth every 6 (six) hours as needed for nausea or vomiting. 07/19/13   Truman Hayward, MD  prochlorperazine (COMPAZINE) 25 MG suppository Place 1 suppository (25 mg total) rectally every 12 (twelve) hours as needed  for nausea. 10/08/12   Carlyle Basques, MD  ritonavir (NORVIR) 100 MG TABS tablet Take 1 tablet (100 mg total) by mouth 2 (two) times daily with a meal. 07/14/13   Truman Hayward, MD  sodium chloride 0.9 % injection Inject 1,000 mLs into the vein as needed. 08/05/13   Carlyle Basques, MD  TIVICAY 50 MG tablet TAKE 1 TABLET (50 MG TOTAL) BY MOUTH TWO TIMES DAILY. 02/10/13   Truman Hayward, MD  valACYclovir (VALTREX) 1000 MG tablet Take 1 tablet (1,000 mg total) by mouth daily. 06/02/12   Truman Hayward, MD  VIREAD 300 MG tablet TAKE 1 TABLET (300 MG TOTAL) BY MOUTH DAILY. 05/21/13   Arvilla Meres  Dam, MD  warfarin (COUMADIN) 1 MG tablet TAKE 1 TABLET (1 MG TOTAL) BY MOUTH DAILY. TAKE AS DIRECTED WITH 5MG  TABLET PER COUMADIN CLINIC 01/13/13   Truman Hayward, MD  warfarin (COUMADIN) 5 MG tablet Take 2.5-5 mg by mouth daily. Alternating days    Historical Provider, MD  warfarin (COUMADIN) 5 MG tablet TAKE 1 TABLET (5 MG TOTAL) BY MOUTH DAILY. 01/13/13   Truman Hayward, MD  zolpidem (AMBIEN) 10 MG tablet Take 1 tablet (10 mg total) by mouth at bedtime as needed for sleep. Take 1 tablet (10 mg total) by mouth nightly as needed for Sleep. May repeat times one as needed 05/24/13   Truman Hayward, MD   Physical Exam: Filed Vitals:   08/05/13 1123  BP: 168/112  Pulse: 97  Temp: 98.3 F (36.8 C)  Resp: 20    BP 168/112  Pulse 97  Temp(Src) 98.3 F (36.8 C) (Oral)  Resp 20  General:  Appears calm and comfortable Eyes: PERRL, normal lids, irises & conjunctiva ENT: grossly normal hearing, lips & tongue Neck: no LAD, masses or thyromegaly Cardiovascular: RRR, no rubs, no cyanosis Telemetry: SR, no arrhythmias  Respiratory: CTA bilaterally, no w/r/r. Normal respiratory effort. Abdomen: soft, nt, nd Skin: no rash or induration seen on limited exam Musculoskeletal: grossly normal tone BUE/BLE Psychiatric: grossly normal mood and affect, speech fluent and  appropriate Neurologic: grossly non-focal.          Labs on Admission:  Basic Metabolic Panel: No results found for this basename: NA, K, CL, CO2, GLUCOSE, BUN, CREATININE, CALCIUM, MG, PHOS,  in the last 168 hours Liver Function Tests: No results found for this basename: AST, ALT, ALKPHOS, BILITOT, PROT, ALBUMIN,  in the last 168 hours No results found for this basename: LIPASE, AMYLASE,  in the last 168 hours No results found for this basename: AMMONIA,  in the last 168 hours CBC: No results found for this basename: WBC, NEUTROABS, HGB, HCT, MCV, PLT,  in the last 168 hours Cardiac Enzymes: No results found for this basename: CKTOTAL, CKMB, CKMBINDEX, TROPONINI,  in the last 168 hours  BNP (last 3 results) No results found for this basename: PROBNP,  in the last 8760 hours CBG: No results found for this basename: GLUCAP,  in the last 168 hours  Radiological Exams on Admission: No results found.   Assessment/Plan Principal problem Gastroenteritis - Most likely viral gastroenteritis. Agree with impression of infectious disease specialist on today's office note. - However will go ahead and run some routine lab work to exclude other causes of abdominal discomfort. - Supportive therapy with anti-emetics  - since I do not have lab work and I do not know what sodium levels are we'll place on D5 normal saline.  Active Problems: S/P AVR (aortic valve replacement) - Patient is on Coumadin chronically. But unable to take by mouth medication for the last day and a half. - Obtain PT/INR stat - Coumadin per pharmacy. Plans will be to start heparin if INR subtherapeutic as patient presumed mechanical valve replacement  Chronic pancreatitis - Obtain lipase level  HTN (hypertension) - Add when necessary hydralazine  Genital herpes - Continue patient's oral regimen  Depression - Stable, will continue patient's oral regimen  HIV - Presumably ID. will followup patient in house as  they have recommended inpatient admission. - Will provide anti-emetics and if tolerated patient to continue home oral regimen.    Code Status: Full Family Communication: Discussed directly  with patient Disposition Plan: Pending resolution of nausea vomiting and diarrhea  Time spent: > 55 minutes  [facility] Pager [phone]  **Disclaimer: This note may have been dictated with voice recognition software. Similar sounding words can inadvertently be transcribed and this note may contain transcription errors which may not have been corrected upon publication of note.**

## 2013-08-06 ENCOUNTER — Other Ambulatory Visit: Payer: Self-pay | Admitting: Infectious Disease

## 2013-08-06 DIAGNOSIS — B20 Human immunodeficiency virus [HIV] disease: Secondary | ICD-10-CM

## 2013-08-06 NOTE — Progress Notes (Signed)
Pt. Left AMA last night form signed Dr Renard Hamper made aware.

## 2013-08-09 ENCOUNTER — Other Ambulatory Visit: Payer: Self-pay | Admitting: Infectious Disease

## 2013-08-23 ENCOUNTER — Encounter: Payer: Self-pay | Admitting: Infectious Disease

## 2013-08-23 ENCOUNTER — Encounter (HOSPITAL_COMMUNITY): Payer: Self-pay

## 2013-08-23 ENCOUNTER — Inpatient Hospital Stay (HOSPITAL_COMMUNITY): Payer: Medicaid Other

## 2013-08-23 ENCOUNTER — Ambulatory Visit (INDEPENDENT_AMBULATORY_CARE_PROVIDER_SITE_OTHER): Payer: Medicaid Other | Admitting: Infectious Disease

## 2013-08-23 ENCOUNTER — Inpatient Hospital Stay (HOSPITAL_COMMUNITY)
Admission: AD | Admit: 2013-08-23 | Discharge: 2013-08-25 | DRG: 391 | Disposition: A | Discharge status: Home / Self Care | Payer: Medicaid Other | Source: Ambulatory Visit | Attending: Internal Medicine | Admitting: Internal Medicine

## 2013-08-23 VITALS — BP 122/84 | HR 69 | Temp 98.0°F | Wt 111.0 lb

## 2013-08-23 DIAGNOSIS — Z952 Presence of prosthetic heart valve: Secondary | ICD-10-CM

## 2013-08-23 DIAGNOSIS — F1210 Cannabis abuse, uncomplicated: Secondary | ICD-10-CM | POA: Diagnosis present

## 2013-08-23 DIAGNOSIS — K859 Acute pancreatitis without necrosis or infection, unspecified: Secondary | ICD-10-CM

## 2013-08-23 DIAGNOSIS — Z681 Body mass index (BMI) 19 or less, adult: Secondary | ICD-10-CM

## 2013-08-23 DIAGNOSIS — Z954 Presence of other heart-valve replacement: Secondary | ICD-10-CM

## 2013-08-23 DIAGNOSIS — B20 Human immunodeficiency virus [HIV] disease: Secondary | ICD-10-CM | POA: Diagnosis not present

## 2013-08-23 DIAGNOSIS — R1115 Cyclical vomiting syndrome unrelated to migraine: Secondary | ICD-10-CM

## 2013-08-23 DIAGNOSIS — F3289 Other specified depressive episodes: Secondary | ICD-10-CM | POA: Diagnosis present

## 2013-08-23 DIAGNOSIS — F4323 Adjustment disorder with mixed anxiety and depressed mood: Secondary | ICD-10-CM

## 2013-08-23 DIAGNOSIS — R197 Diarrhea, unspecified: Secondary | ICD-10-CM | POA: Diagnosis not present

## 2013-08-23 DIAGNOSIS — Q2733 Arteriovenous malformation of digestive system vessel: Secondary | ICD-10-CM

## 2013-08-23 DIAGNOSIS — Z9089 Acquired absence of other organs: Secondary | ICD-10-CM

## 2013-08-23 DIAGNOSIS — F329 Major depressive disorder, single episode, unspecified: Secondary | ICD-10-CM | POA: Diagnosis present

## 2013-08-23 DIAGNOSIS — R112 Nausea with vomiting, unspecified: Secondary | ICD-10-CM | POA: Diagnosis not present

## 2013-08-23 DIAGNOSIS — D6832 Hemorrhagic disorder due to extrinsic circulating anticoagulants: Secondary | ICD-10-CM

## 2013-08-23 DIAGNOSIS — K5289 Other specified noninfective gastroenteritis and colitis: Secondary | ICD-10-CM

## 2013-08-23 DIAGNOSIS — F32A Depression, unspecified: Secondary | ICD-10-CM

## 2013-08-23 DIAGNOSIS — Z21 Asymptomatic human immunodeficiency virus [HIV] infection status: Secondary | ICD-10-CM

## 2013-08-23 DIAGNOSIS — Z8673 Personal history of transient ischemic attack (TIA), and cerebral infarction without residual deficits: Secondary | ICD-10-CM

## 2013-08-23 DIAGNOSIS — K861 Other chronic pancreatitis: Secondary | ICD-10-CM | POA: Diagnosis present

## 2013-08-23 DIAGNOSIS — K219 Gastro-esophageal reflux disease without esophagitis: Secondary | ICD-10-CM | POA: Diagnosis present

## 2013-08-23 DIAGNOSIS — F17200 Nicotine dependence, unspecified, uncomplicated: Secondary | ICD-10-CM | POA: Diagnosis present

## 2013-08-23 DIAGNOSIS — I6350 Cerebral infarction due to unspecified occlusion or stenosis of unspecified cerebral artery: Secondary | ICD-10-CM

## 2013-08-23 DIAGNOSIS — Z515 Encounter for palliative care: Secondary | ICD-10-CM

## 2013-08-23 DIAGNOSIS — E43 Unspecified severe protein-calorie malnutrition: Secondary | ICD-10-CM | POA: Diagnosis present

## 2013-08-23 DIAGNOSIS — Z79899 Other long term (current) drug therapy: Secondary | ICD-10-CM

## 2013-08-23 DIAGNOSIS — B351 Tinea unguium: Secondary | ICD-10-CM

## 2013-08-23 DIAGNOSIS — K5520 Angiodysplasia of colon without hemorrhage: Secondary | ICD-10-CM

## 2013-08-23 DIAGNOSIS — T45515A Adverse effect of anticoagulants, initial encounter: Secondary | ICD-10-CM

## 2013-08-23 DIAGNOSIS — M009 Pyogenic arthritis, unspecified: Secondary | ICD-10-CM

## 2013-08-23 DIAGNOSIS — E876 Hypokalemia: Secondary | ICD-10-CM

## 2013-08-23 DIAGNOSIS — R0981 Nasal congestion: Secondary | ICD-10-CM

## 2013-08-23 DIAGNOSIS — Z7901 Long term (current) use of anticoagulants: Secondary | ICD-10-CM

## 2013-08-23 DIAGNOSIS — I10 Essential (primary) hypertension: Secondary | ICD-10-CM

## 2013-08-23 DIAGNOSIS — D649 Anemia, unspecified: Secondary | ICD-10-CM

## 2013-08-23 DIAGNOSIS — R110 Nausea: Secondary | ICD-10-CM | POA: Diagnosis present

## 2013-08-23 DIAGNOSIS — A6000 Herpesviral infection of urogenital system, unspecified: Secondary | ICD-10-CM

## 2013-08-23 DIAGNOSIS — B3781 Candidal esophagitis: Secondary | ICD-10-CM

## 2013-08-23 DIAGNOSIS — Z9049 Acquired absence of other specified parts of digestive tract: Secondary | ICD-10-CM

## 2013-08-23 DIAGNOSIS — R1310 Dysphagia, unspecified: Secondary | ICD-10-CM

## 2013-08-23 DIAGNOSIS — R109 Unspecified abdominal pain: Secondary | ICD-10-CM

## 2013-08-23 DIAGNOSIS — K529 Noninfective gastroenteritis and colitis, unspecified: Secondary | ICD-10-CM

## 2013-08-23 DIAGNOSIS — K589 Irritable bowel syndrome without diarrhea: Secondary | ICD-10-CM | POA: Diagnosis present

## 2013-08-23 LAB — CBC WITH DIFFERENTIAL/PLATELET
BASOS PCT: 0 % (ref 0–1)
Basophils Absolute: 0 10*3/uL (ref 0.0–0.1)
Basophils Absolute: 0 10*3/uL (ref 0.0–0.1)
Basophils Relative: 1 % (ref 0–1)
Eosinophils Absolute: 0.1 10*3/uL (ref 0.0–0.7)
Eosinophils Absolute: 0.1 10*3/uL (ref 0.0–0.7)
Eosinophils Relative: 3 % (ref 0–5)
Eosinophils Relative: 2 % (ref 0–5)
HCT: 39.7 % (ref 39.0–52.0)
HCT: 37 % — ABNORMAL LOW (ref 39.0–52.0)
HEMOGLOBIN: 14.1 g/dL (ref 13.0–17.0)
HEMOGLOBIN: 13.1 g/dL (ref 13.0–17.0)
LYMPHS ABS: 2.1 10*3/uL (ref 0.7–4.0)
LYMPHS PCT: 54 % — AB (ref 12–46)
Lymphocytes Relative: 48 % — ABNORMAL HIGH (ref 12–46)
Lymphs Abs: 1.6 10*3/uL (ref 0.7–4.0)
MCH: 38.6 pg — ABNORMAL HIGH (ref 26.0–34.0)
MCH: 38.5 pg — AB (ref 26.0–34.0)
MCHC: 35.5 g/dL (ref 30.0–36.0)
MCHC: 35.4 g/dL (ref 30.0–36.0)
MCV: 108.8 fL — ABNORMAL HIGH (ref 78.0–100.0)
MCV: 108.8 fL — ABNORMAL HIGH (ref 78.0–100.0)
MONOS PCT: 6 % (ref 3–12)
MONOS PCT: 6 % (ref 3–12)
Monocytes Absolute: 0.2 10*3/uL (ref 0.1–1.0)
Monocytes Absolute: 0.3 10*3/uL (ref 0.1–1.0)
NEUTROS ABS: 1.5 10*3/uL — AB (ref 1.7–7.7)
NEUTROS PCT: 37 % — AB (ref 43–77)
Neutro Abs: 1.5 10*3/uL — ABNORMAL LOW (ref 1.7–7.7)
Neutrophils Relative %: 43 % (ref 43–77)
PLATELETS: 173 10*3/uL (ref 150–400)
PLATELETS: 149 10*3/uL — AB (ref 150–400)
RBC: 3.65 MIL/uL — AB (ref 4.22–5.81)
RBC: 3.4 MIL/uL — AB (ref 4.22–5.81)
RDW: 12.8 % (ref 11.5–15.5)
RDW: 13 % (ref 11.5–15.5)
WBC: 3.4 10*3/uL — ABNORMAL LOW (ref 4.0–10.5)
WBC: 3.9 10*3/uL — AB (ref 4.0–10.5)

## 2013-08-23 LAB — URINE MICROSCOPIC-ADD ON

## 2013-08-23 LAB — URINALYSIS, ROUTINE W REFLEX MICROSCOPIC
Glucose, UA: NEGATIVE mg/dL
KETONES UR: NEGATIVE mg/dL
Leukocytes, UA: NEGATIVE
NITRITE: NEGATIVE
Protein, ur: 30 mg/dL — AB
Specific Gravity, Urine: 1.025 (ref 1.005–1.030)
UROBILINOGEN UA: 1 mg/dL (ref 0.0–1.0)
pH: 6 (ref 5.0–8.0)

## 2013-08-23 LAB — COMPREHENSIVE METABOLIC PANEL
ALT: 17 U/L (ref 0–53)
AST: 19 U/L (ref 0–37)
Albumin: 3.8 g/dL (ref 3.5–5.2)
Alkaline Phosphatase: 83 U/L (ref 39–117)
BUN: 15 mg/dL (ref 6–23)
CALCIUM: 9.2 mg/dL (ref 8.4–10.5)
CO2: 28 meq/L (ref 19–32)
CREATININE: 1.51 mg/dL — AB (ref 0.50–1.35)
Chloride: 102 mEq/L (ref 96–112)
GFR calc Af Amer: 62 mL/min — ABNORMAL LOW (ref >90)
GFR, EST NON AFRICAN AMERICAN: 54 mL/min — AB (ref >90)
Glucose, Bld: 86 mg/dL (ref 70–99)
Potassium: 3.4 mEq/L — ABNORMAL LOW (ref 3.7–5.3)
SODIUM: 141 meq/L (ref 137–147)
TOTAL PROTEIN: 6.7 g/dL (ref 6.0–8.3)
Total Bilirubin: 0.6 mg/dL (ref 0.3–1.2)

## 2013-08-23 LAB — COMPLETE METABOLIC PANEL WITH GFR
ALT: 16 U/L (ref 0–53)
AST: 19 U/L (ref 0–37)
Albumin: 4.1 g/dL (ref 3.5–5.2)
Alkaline Phosphatase: 92 U/L (ref 39–117)
BILIRUBIN TOTAL: 0.7 mg/dL (ref 0.2–1.2)
BUN: 14 mg/dL (ref 6–23)
CALCIUM: 9.6 mg/dL (ref 8.4–10.5)
CHLORIDE: 101 meq/L (ref 96–112)
CO2: 30 meq/L (ref 19–32)
CREATININE: 1.61 mg/dL — AB (ref 0.50–1.35)
GFR, EST NON AFRICAN AMERICAN: 51 mL/min — AB
GFR, Est African American: 58 mL/min — ABNORMAL LOW
Glucose, Bld: 98 mg/dL (ref 70–99)
Potassium: 3.8 mEq/L (ref 3.5–5.3)
Sodium: 142 mEq/L (ref 135–145)
Total Protein: 7.2 g/dL (ref 6.0–8.3)

## 2013-08-23 LAB — TSH: TSH: 0.81 u[IU]/mL (ref 0.350–4.500)

## 2013-08-23 LAB — MAGNESIUM: Magnesium: 1.7 mg/dL (ref 1.5–2.5)

## 2013-08-23 LAB — PROTIME-INR
INR: 2.31 — AB (ref 0.00–1.49)
Prothrombin Time: 24.6 seconds — ABNORMAL HIGH (ref 11.6–15.2)

## 2013-08-23 LAB — LIPASE, BLOOD: Lipase: 18 U/L (ref 11–59)

## 2013-08-23 LAB — AMYLASE: Amylase: 93 U/L (ref 0–105)

## 2013-08-23 MED ORDER — TENOFOVIR DISOPROXIL FUMARATE 300 MG PO TABS
300.0000 mg | ORAL_TABLET | Freq: Every day | ORAL | Status: DC
  Administered 2013-08-23 – 2013-08-25 (×3): 300 mg via ORAL
  Filled 2013-08-23 (×3): qty 1

## 2013-08-23 MED ORDER — DIPHENHYDRAMINE HCL 25 MG PO CAPS
25.0000 mg | ORAL_CAPSULE | Freq: Four times a day (QID) | ORAL | Status: DC | PRN
  Administered 2013-08-23 – 2013-08-25 (×4): 25 mg via ORAL
  Filled 2013-08-23 (×4): qty 1

## 2013-08-23 MED ORDER — ENOXAPARIN SODIUM 40 MG/0.4ML ~~LOC~~ SOLN: 40.0000 mg | SUBCUTANEOUS | Status: DC

## 2013-08-23 MED ORDER — SODIUM CHLORIDE 0.9 % IV SOLN
INTRAVENOUS | Status: DC
  Administered 2013-08-23 – 2013-08-25 (×3): via INTRAVENOUS

## 2013-08-23 MED ORDER — ONDANSETRON HCL 4 MG PO TABS: 4.0000 mg | ORAL_TABLET | Freq: Four times a day (QID) | ORAL | Status: DC | PRN

## 2013-08-23 MED ORDER — DAPSONE 100 MG PO TABS
100.0000 mg | ORAL_TABLET | Freq: Every day | ORAL | Status: DC
  Administered 2013-08-23 – 2013-08-25 (×3): 100 mg via ORAL
  Filled 2013-08-23 (×3): qty 1

## 2013-08-23 MED ORDER — LAMIVUDINE-ZIDOVUDINE 150-300 MG PO TABS
1.0000 | ORAL_TABLET | Freq: Two times a day (BID) | ORAL | Status: DC
  Administered 2013-08-23 – 2013-08-25 (×5): 1 via ORAL
  Filled 2013-08-23 (×6): qty 1

## 2013-08-23 MED ORDER — SODIUM CHLORIDE 0.9 % IV SOLN
12.5000 mg | Freq: Four times a day (QID) | INTRAVENOUS | Status: DC | PRN
  Filled 2013-08-23: qty 0.5

## 2013-08-23 MED ORDER — ONDANSETRON HCL 4 MG/2ML IJ SOLN
4.0000 mg | Freq: Four times a day (QID) | INTRAMUSCULAR | Status: DC | PRN
  Administered 2013-08-23 – 2013-08-25 (×3): 4 mg via INTRAVENOUS
  Filled 2013-08-23 (×3): qty 2

## 2013-08-23 MED ORDER — VALACYCLOVIR HCL 500 MG PO TABS
1000.0000 mg | ORAL_TABLET | Freq: Every day | ORAL | Status: DC
  Filled 2013-08-23: qty 2

## 2013-08-23 MED ORDER — HYDROMORPHONE HCL PF 1 MG/ML IJ SOLN
1.0000 mg | INTRAMUSCULAR | Status: DC | PRN
  Administered 2013-08-23 – 2013-08-25 (×10): 1 mg via INTRAVENOUS
  Filled 2013-08-23 (×10): qty 1

## 2013-08-23 MED ORDER — FLUTICASONE PROPIONATE 50 MCG/ACT NA SUSP
2.0000 | Freq: Every day | NASAL | Status: DC
  Administered 2013-08-23: 2 via NASAL
  Filled 2013-08-23: qty 16

## 2013-08-23 MED ORDER — VANCOMYCIN HCL 500 MG IV SOLR
500.0000 mg | Freq: Two times a day (BID) | INTRAVENOUS | Status: DC
  Administered 2013-08-23 – 2013-08-24 (×2): 500 mg via INTRAVENOUS
  Filled 2013-08-23 (×2): qty 500

## 2013-08-23 MED ORDER — RITONAVIR 100 MG PO TABS
100.0000 mg | ORAL_TABLET | Freq: Two times a day (BID) | ORAL | Status: DC
  Administered 2013-08-23 – 2013-08-25 (×4): 100 mg via ORAL
  Filled 2013-08-23 (×6): qty 1

## 2013-08-23 MED ORDER — MORPHINE SULFATE 2 MG/ML IJ SOLN: 1.0000 mg | INTRAMUSCULAR | Status: DC | PRN

## 2013-08-23 MED ORDER — SODIUM CHLORIDE 0.9 % IV SOLN
250.0000 mg | Freq: Three times a day (TID) | INTRAVENOUS | Status: DC
  Administered 2013-08-23 – 2013-08-24 (×2): 250 mg via INTRAVENOUS
  Filled 2013-08-23 (×2): qty 250

## 2013-08-23 MED ORDER — WARFARIN - PHARMACIST DOSING INPATIENT: Freq: Every day | Status: DC

## 2013-08-23 MED ORDER — DARUNAVIR ETHANOLATE 600 MG PO TABS
600.0000 mg | ORAL_TABLET | Freq: Two times a day (BID) | ORAL | Status: DC
  Administered 2013-08-23 – 2013-08-25 (×4): 600 mg via ORAL
  Filled 2013-08-23 (×6): qty 1

## 2013-08-23 MED ORDER — ESCITALOPRAM OXALATE 20 MG PO TABS
20.0000 mg | ORAL_TABLET | Freq: Every day | ORAL | Status: DC
  Administered 2013-08-23 – 2013-08-25 (×3): 20 mg via ORAL
  Filled 2013-08-23 (×3): qty 1

## 2013-08-23 MED ORDER — DOLUTEGRAVIR SODIUM 50 MG PO TABS
50.0000 mg | ORAL_TABLET | Freq: Two times a day (BID) | ORAL | Status: DC
  Administered 2013-08-23 – 2013-08-25 (×5): 50 mg via ORAL
  Filled 2013-08-23 (×6): qty 1

## 2013-08-23 MED ORDER — WARFARIN SODIUM 10 MG PO TABS
10.0000 mg | ORAL_TABLET | Freq: Once | ORAL | Status: DC
  Filled 2013-08-23: qty 1

## 2013-08-23 MED ORDER — FLUNISOLIDE 25 MCG/ACT (0.025%) NA SOLN: 2.0000 | Freq: Two times a day (BID) | NASAL | Status: DC

## 2013-08-23 MED ORDER — WARFARIN SODIUM 6 MG PO TABS
9.0000 mg | ORAL_TABLET | Freq: Once | ORAL | Status: DC
  Filled 2013-08-23: qty 1

## 2013-08-23 NOTE — Progress Notes (Signed)
ANTICOAGULATION CONSULT NOTE - Initial Consult  Pharmacy Consult for Warfarin Indication: Mechanical prosthetic AVR  Allergies  Allergen Reactions  . Bactrim [Sulfamethoxazole-Trimethoprim]   . Bee Venom Anaphylaxis  . Sulfa Antibiotics Anaphylaxis  . Truvada [Emtricitabine-Tenofovir] Anaphylaxis    Takes plain tenofovir at home  . Lidoderm [Lidocaine] Other (See Comments)    Reaction unknown  . Raltegravir     resistance  . Sulfamethoxazole Itching  . Ceftriaxone Rash    Patient Measurements:     Vital Signs: Temp: 98.7 F (37.1 C) (06/15 1305) Temp src: Oral (06/15 1305) BP: 146/78 mmHg (06/15 1305) Pulse Rate: 69 (06/15 1305)  Labs:  Recent Labs  08/23/13 1126  HGB 14.1  HCT 39.7  PLT 173  CREATININE 1.61*    The CrCl is unknown because both a height and weight (above a minimum accepted value) are required for this calculation.   Medical History: Past Medical History  Diagnosis Date  . HIV (human immunodeficiency virus infection)   . Hypertension   . Stroke   . Pancreatitis   . Mechanical heart valve present   . Arthritis   . Anemia   . GERD (gastroesophageal reflux disease)   . Abdominal pain   . Weight loss, unintentional   . Constipation   . Nausea & vomiting   . Diarrhea     Medications:  Scheduled:  . dapsone  100 mg Oral Daily  . darunavir  600 mg Oral BID WC  . dolutegravir  50 mg Oral BID  . escitalopram  20 mg Oral Daily  . flunisolide  2 spray Nasal BID  . lamiVUDine-zidovudine  1 tablet Oral BID  . ritonavir  100 mg Oral BID WC  . tenofovir  300 mg Oral Daily  . valACYclovir  1,000 mg Oral Daily   Infusions:  . sodium chloride     PRN: chlorproMAZINE (THORAZINE) IV, morphine injection, ondansetron (ZOFRAN) IV, ondansetron  Assessment:  48 yo M with h/o mechanical AVR on chronic anticoagulation with warfarin, PMH includes HIV (ART regimen per ID) admitted with worsening/chronic nausea/vomiting/diarrhea, admitted for IV  fluids, IV abx and consideration of repeat endoscopy.  Pharmacy asked to resume warfarin  Home warfarin dose = 6 mg on MWF and 5 mg on TTSS  INR on admission is subtherapeutic 1.61 (Note INR goal is 2.5-3.5 per [facility] records) - Last dose PTA was 6/14  CBC is WNL, will monitor  Drug-Drug interactions: Ritonavir - Can decrease INR  Goal of Therapy:  INR goal 2.5-3.5 Monitor platelets by anticoagulation protocol: Yes   Plan:  1.) Hold warfarin dose tonight in anticipation of possible GI procedure.  Discussed with Dr. Sherral Hammers, if warfarin continues to be held and INR trends down will consider bridging with IV heparin  2.) Daily PT/INR   Sarrah Fiorenza, Gaye Alken PharmD Pager #: [phone] 2:52 PM 08/23/2013

## 2013-08-23 NOTE — Progress Notes (Signed)
Subjective:    Patient ID: Brandon [name], male    DOB: Aug 19, 1965, 48 y.o.   MRN: [id number]  HPI   Mr Lundstrom is a highly complicated man with HIV/AIDS and Multi-DRUG RESISTANT virus formerly followed at [facility]. Apparently he had been taken off ARV and placed with hospice.  He has been on various complicated antiretroviral regimens in the past, with unfortunate GENOTYPIC resistance to all non-nucleoside reverse transcriptase inhibitors and all NRTIs, Resistance to all protease inhibitors with the exception of Prezista which had some activity genotypically,, Resistance to Isentress, and Elvitegravir and 100X reduced S to dolutegravir having both a 148H and 140S  and with Dual tropic virus.  When he had at [facility]  on a regimen of Twice daily Prezista, Norvir, twice daily AZT, once daily Viread, with these meds stopped n 02/2012. Prior to this had been on similar regimen but with isentress and maraviroc (despite IN R and dual mixed virus. At that time Dolutegravir was not available)  02/12/2010 phenotype at [facility] showed:  RT: NRTI: ABC, DDI, D4T, AZT, TDF: resistant; 3TC, FTC: susceptible; NNRTI: EFV susceptible; RPV, NVP, ETR, DLV: Resistant; PI: pan-resistant  He had decided now to go back onto ARVS and is referred to [facility].  We  Have seen him and placed him on a  salvage regimen of Prezista 600mg   Twice daily boosted with Norvir 100mg  twice daily, Tivicay twice daily, Combivir twice daily and once daily Viread.  And since then he has now an UNDETECTABLE VIRAL LOAD <20.  And CD4 >200  when las checked in April. He has maintained perfect virological suppresion for past 12 months.  He continues to suffer from chronic nausea and vomiting with diarrhea.  We had tried to see if addition of compazine might help and it did stablize things for a bit but he then had worsening and my partner Dr. Baxter Flattery arranged for hospital admission but he left within 24 hours because of anger when he was  given wrong dose of Prezista  He comes in today with worsening nausea, and vomiting. He has noticed that is the worst in the am when he goes to make BM int he bathroom. He has been able to keep his antiretrovirals down on most days with the exception of proximally one day per week.  One year ago he had an upper endoscopy performed by Saralyn Pilar hung which showed no esophageal abnormalities and a nonbleeding avascular malformation.  The patient denies having coffee-ground emesis or frank blood his vomiting is largely a bilious vomit. He had prior cholecystectomy.  After much discussion we've decided that in the hospital today for IV fluid hydration IV antibiotics and consultation with gastroenterology for consideration of endoscopy again. There had been consideration of lower colonoscopy at the last visit but perhaps he may need another upper GI as well as most of the symptoms now seem to be coming from the upper tract   Review of Systems  Constitutional: Positive for fatigue. Negative for fever, chills, diaphoresis, activity change, appetite change and unexpected weight change.  HENT: Negative for congestion, rhinorrhea, sinus pressure, sneezing, sore throat and trouble swallowing.   Eyes: Negative for photophobia and visual disturbance.  Respiratory: Negative for cough, chest tightness, shortness of breath, wheezing and stridor.   Cardiovascular: Negative for chest pain, palpitations and leg swelling.  Gastrointestinal: Positive for nausea, vomiting, diarrhea and blood in stool. Negative for constipation, abdominal distention and anal bleeding.  Genitourinary: Negative for dysuria, hematuria, flank pain  and difficulty urinating.  Musculoskeletal: Negative for arthralgias, back pain, gait problem, joint swelling and myalgias.  Skin: Negative for color change, rash and wound.  Neurological: Negative for dizziness, tremors, weakness and light-headedness.  Hematological: Negative for adenopathy. Does  not bruise/bleed easily.  Psychiatric/Behavioral: Positive for dysphoric mood. Negative for behavioral problems, confusion, sleep disturbance, decreased concentration and agitation.       Objective:   Physical Exam  Constitutional: He is oriented to person, place, and time. No distress.  HENT:  Head: Normocephalic and atraumatic.  Mouth/Throat: Posterior oropharyngeal edema present. No oropharyngeal exudate.  Eyes: Conjunctivae and EOM are normal. Pupils are equal, round, and reactive to light. Right eye exhibits no discharge and no exudate. Left eye exhibits no discharge and no exudate. Right conjunctiva is not injected. Right conjunctiva has no hemorrhage. Left conjunctiva is not injected. Left conjunctiva has no hemorrhage.  Neck: Normal range of motion. Neck supple.  Cardiovascular: Regular rhythm and normal heart sounds.  Exam reveals no gallop and no friction rub.   No murmur heard. Pulmonary/Chest: Effort normal and breath sounds normal. No respiratory distress. He has no wheezes. He has no rales.  Abdominal: Soft. Bowel sounds are normal. He exhibits no mass. There is tenderness. There is no rebound and no guarding.  Musculoskeletal: He exhibits no edema and no tenderness.  Lymphadenopathy:    He has no cervical adenopathy.  Neurological: He is alert and oriented to person, place, and time. He exhibits normal muscle tone. Coordination normal.  Skin: Skin is warm and dry. No erythema. No pallor.  Psychiatric: His behavior is normal. Judgment and thought content normal. His mood appears not anxious. He does not exhibit a depressed mood.          Assessment & Plan:   #1 HIV: Highly Resistant Virus.  Continue the  --Tivicay 50mg  BID --Prezista 600mg  BID with  --Norvir 100mg  BID --Combivir 1 tablet BID --Viread q daily  HE SHOULD NOT TAKE ENSURE AT THE SAME TIME AS HIS TIVICAY!!!!  OI prophylaxis: continue dapsone for now but if CD4 still above 200,  will discontinue  it  Nausea and vomiting: hx of AVM on anticoaguation but no coffee-ground emesis or blood in the vomit I think he would benefit from IV hydration IV antibiotic medics and a GI consult. Noted was previous seen by Carol Ada. I am arranging for her-and 2 was a long hospital and will follow him over there as a consulting  Physician. I spent greater than 40 minutes with the patient including greater than 50% of time in face to face counsel of the patient and in coordination of their care.  I spent greater than 40 minutes with the patient including greater than 50% of time in face to face counsel of the patient and in coordination of their care.  Depression: Continue SSRI. Note he has multiple stressors related to both his own health his wife. His wife had non-Hodgkin lymphoma that was successfully treated but now with apparent endometrial cancer. According to patient both [facility] and a local oncologist of unwilling to treat her because of her diabetes being poorly controlled.  Pain: See both MS Contin and short-acting MSIR   AVR: mechanical on coumadin followed at [facility].

## 2013-08-23 NOTE — H&P (Signed)
[facility] History and Physical  Brandon [name] ZJQ:[id number] DOB: Nov 01, 1965 DOA: 08/23/2013  Referring physician: Dr. Rhina Brackett Dam PCP: [name], MD  Specialists:   Chief Complaint: HIV, intractable nausea and vomiting  HPI: Brandon [name] is a 48 y.o. BM PMHx S/P AVR replaced 2409 highly complicated man with HIV/AIDS and Multi-DRUG RESISTANT virus formerly followed at [facility]. Apparently he had been taken off ARV and placed with hospice.  He has been on various complicated antiretroviral regimens in the past, with unfortunate GENOTYPIC resistance to all non-nucleoside reverse transcriptase inhibitors and all NRTIs, Resistance to all protease inhibitors with the exception of Prezista which had some activity genotypically,, Resistance to Isentress, and Elvitegravir and 100X reduced S to dolutegravir having both a 148H and 140S and with Dual tropic virus.  When he had at [facility] on a regimen of Twice daily Prezista, Norvir, twice daily AZT, once daily Viread, with these meds stopped n 02/2012. Prior to this had been on similar regimen but with isentress and maraviroc (despite IN R and dual mixed virus. At that time Dolutegravir was not available)  02/12/2010 phenotype at [facility] showed: RT: NRTI: ABC, DDI, D4T, AZT, TDF: resistant; 3TC, FTC: susceptible; NNRTI: EFV susceptible; RPV, NVP, ETR, DLV: Resistant; PI: pan-resistant  He had decided now to go back onto ARVS and is referred to [facility].  We Have seen him and placed him on a salvage regimen of Prezista 600mg  Twice daily boosted with Norvir 100mg  twice daily, Tivicay twice daily, Combivir twice daily and once daily Viread.  And since then he has now an UNDETECTABLE VIRAL LOAD <20. And CD4 >200 when las checked in April. He has maintained perfect virological suppresion for past 12 months.  He continues to suffer from chronic nausea and vomiting with diarrhea.   We had tried to see if addition of compazine might help  and it did stablize things for a bit but he then had worsening and my partner Dr. Baxter Flattery arranged for hospital admission but he left within 24 hours because of anger when he was given wrong dose of Prezista  He comes in today with worsening nausea, and vomiting. He has noticed that is the worst in the am when he goes to clinic been able to keep his antiretrovirals down on most days with the exception of proximally one day per week.  One year ago he had an upper endoscopy performed by Saralyn Pilar hung which showed no esophageal abnormalities and a nonbleeding avascular malformation.  The patient denies having coffee-ground emesis or frank blood his vomiting is largely a bilious vomit. He had prior cholecystectomy.  After much discussion we've decided that in the hospital today for IV fluid hydration IV antibiotics and consultation with gastroenterology for consideration of endoscopy again. There had been consideration of lower colonoscopy at the last visit but perhaps he may need another upper GI as well as most of the symptoms now seem to be coming from the upper tract  6/15 rectum it from infectious disease clinic per patient N/V. x2 weeks, positive waxing and waning chills, positive diaphoresis, positive abdominal pain, negative CP/SOB, positive watery diarrhea which started on Saturday, negative blood in stool, negative consumption of raw food, negative travel, negative sick contacts. States unable to keep his normal antiemetic medicine down (Zofran or Compazine)  Review of Systems: The patient denies,  weight loss,, vision loss, decreased hearing, hoarseness, chest pain, syncope, dyspnea on exertion, peripheral edema, balance deficits, hemoptysis, melena, hematochezia, severe  indigestion/heartburn, hematuria, incontinence, genital sores, muscle weakness, suspicious skin lesions, transient blindness, difficulty walking, depression, unusual weight change, abnormal bleeding, enlarged lymph nodes, angioedema,  and breast masses.    TRAVEL HISTORY: None    Consultants:  Dr. Rhina Brackett Dam (infectious disease)  Procedure/Significant Events:  Abdominal ultrasound pending Acute abdominal series pending     Culture   6/15 blood pending 6/15 urine pending  6/15 stool pending   Antibiotics:  Vancomycin 6/15>> Zosyn 6/15>>   DVT prophylaxis:    Devices     LINES / TUBES:     Past Medical History  Diagnosis Date  . HIV (human immunodeficiency virus infection)   . Hypertension   . Stroke   . Pancreatitis   . Mechanical heart valve present   . Arthritis   . Anemia   . GERD (gastroesophageal reflux disease)   . Abdominal pain   . Weight loss, unintentional   . Constipation   . Nausea & vomiting   . Diarrhea    Past Surgical History  Procedure Laterality Date  . Cardiac surgery    . Knee surgery    . Cholecystectomy  02/12/2012    Procedure: LAPAROSCOPIC CHOLECYSTECTOMY;  Surgeon: Stark Klein, MD;  Location: [place];  Service: General;  Laterality: N/A;  . Esophagogastroduodenoscopy N/A 07/24/2012    Procedure: ESOPHAGOGASTRODUODENOSCOPY (EGD);  Surgeon: Beryle Beams, MD;  Location: [facility] ENDOSCOPY;  Service: Endoscopy;  Laterality: N/A;  . Aortic valve replacement     Social History:  Positive smokes 2 cigars per day x11 years.  He has never used smokeless tobacco. He reports that he drinks alcohol. He reports that he uses illicit drugs (Marijuana) about 7 times per week. where does patient live--home, ALF, SNF? Home with what   Can patient participate in ADLs?  Allergies  Allergen Reactions  . Bactrim [Sulfamethoxazole-Trimethoprim]   . Bee Venom Anaphylaxis  . Sulfa Antibiotics Anaphylaxis  . Truvada [Emtricitabine-Tenofovir] Anaphylaxis    Takes plain tenofovir at home  . Lidoderm [Lidocaine] Other (See Comments)    Reaction unknown  . Raltegravir     resistance  . Sulfamethoxazole Itching  . Ceftriaxone Rash    Family History  Problem Relation Age  of Onset  . Hypertension Father   . Cancer - Prostate Father   . Cancer Father     stomach  . Hypertension Sister   . Diabetes Maternal Aunt   . Cancer - Other Cousin   . Parkinson's disease Paternal Aunt     Prior to Admission medications   Medication Sig Start Date End Date Taking? Authorizing Provider  dapsone 100 MG tablet Take 100 mg by mouth daily.    Historical Provider, MD  darunavir (PREZISTA) 600 MG tablet Take 600 mg by mouth 2 (two) times daily with a meal.    Historical Provider, MD  dronabinol (MARINOL) 5 MG capsule Take 1 capsule (5 mg total) by mouth 2 (two) times daily before a meal. 07/19/13   Truman Hayward, MD  ENSURE (ENSURE) Take 237 mLs by mouth 2 (two) times daily between meals. Provide a case per month: BMI<20 07/19/13   Truman Hayward, MD  EPINEPHrine (EPI-PEN) 0.3 mg/0.3 mL DEVI Inject 0.3 mLs (0.3 mg total) into the muscle once. 08/20/12   Truman Hayward, MD  escitalopram (LEXAPRO) 20 MG tablet Take 1 tablet (20 mg total) by mouth daily. 07/19/13   Truman Hayward, MD  flunisolide (NASALIDE) 25 MCG/ACT (0.025%) SOLN Place  2 sprays into the nose at bedtime as needed. 03/17/13   Truman Hayward, MD  lamiVUDine-zidovudine (COMBIVIR) 150-300 MG per tablet Take 1 tablet by mouth 2 (two) times daily.    Historical Provider, MD  LORazepam (ATIVAN) 1 MG tablet TAKE 1 TABLET BY MOUTH EVERY EIGHT HOURS AS NEEDED    Truman Hayward, MD  megestrol (MEGACE) 40 MG/ML suspension Take 800 mg by mouth daily.    Historical Provider, MD  morphine (MS CONTIN) 15 MG 12 hr tablet Take 1 tablet (15 mg total) by mouth 2 (two) times daily. 07/19/13   Truman Hayward, MD  morphine (MSIR) 15 MG tablet Take 1 tablet (15 mg total) by mouth 3 (three) times daily as needed. 07/19/13   Truman Hayward, MD  ondansetron (ZOFRAN-ODT) 8 MG disintegrating tablet Take 8 mg by mouth every 8 (eight) hours as needed for nausea or vomiting.    Historical Provider, MD   pantoprazole (PROTONIX) 40 MG tablet Take 40 mg by mouth daily.    Historical Provider, MD  prochlorperazine (COMPAZINE) 10 MG tablet Take 1 tablet (10 mg total) by mouth every 6 (six) hours as needed for nausea or vomiting. 07/19/13   Truman Hayward, MD  ritonavir (NORVIR) 100 MG TABS tablet Take 1 tablet (100 mg total) by mouth 2 (two) times daily with a meal. 07/14/13   Truman Hayward, MD  tenofovir (VIREAD) 300 MG tablet Take 300 mg by mouth daily.    Historical Provider, MD  TIVICAY 50 MG tablet TAKE 1 TABLET (50 MG TOTAL) BY MOUTH TWO TIMES DAILY. 08/06/13   Truman Hayward, MD  valACYclovir (VALTREX) 1000 MG tablet Take 1 tablet (1,000 mg total) by mouth daily. 06/02/12   Truman Hayward, MD  warfarin (COUMADIN) 5 MG tablet Take 5 mg by mouth See admin instructions. Takes on Tuesday, Thursday, Saturday, and Sunday    Historical Provider, MD  zolpidem (AMBIEN) 10 MG tablet Take 1 tablet (10 mg total) by mouth at bedtime as needed for sleep. Take 1 tablet (10 mg total) by mouth nightly as needed for Sleep. May repeat times one as needed 05/24/13   Truman Hayward, MD   Physical Exam: Filed Vitals:   08/23/13 1305  BP: 146/78  Pulse: 69  Temp: 98.7 F (37.1 C)  TempSrc: Oral  Resp: 18  Weight: 46.312 kg (102 lb 1.6 oz)  SpO2: 99%     General:  A./O. x4, moderate distress secondary to abdominal pain,  Eyes: Pupils equal round reactive to light and accommodation   Neck: Negative lymphadenopathy, negative JVD   Cardiovascular: Regular rhythm and rate   Respiratory: Clear to auscultation bilateral  Abdomen: Abdominal pain concentrated in the RUQ/LUQ   Skin: Negative lesions or rash   Musculoskeletal: Negative pedal   Neurologic: Intact  Labs on Admission:  Basic Metabolic Panel:  Recent Labs Lab 08/23/13 1126 08/23/13 1435  NA 142 141  K 3.8 3.4*  CL 101 102  CO2 30 28  GLUCOSE 98 86  BUN 14 15  CREATININE 1.61* 1.51*  CALCIUM 9.6 9.2  MG  --   1.7   Liver Function Tests:  Recent Labs Lab 08/23/13 1126 08/23/13 1435  AST 19 19  ALT 16 17  ALKPHOS 92 83  BILITOT 0.7 0.6  PROT 7.2 6.7  ALBUMIN 4.1 3.8    Recent Labs Lab 08/23/13 1435  LIPASE 18  AMYLASE 93  No results found for this basename: AMMONIA,  in the last 168 hours CBC:  Recent Labs Lab 08/23/13 1126 08/23/13 1435  WBC 3.4* 3.9*  NEUTROABS 1.5* 1.5*  HGB 14.1 13.1  HCT 39.7 37.0*  MCV 108.8* 108.8*  PLT 173 149*   Cardiac Enzymes: No results found for this basename: CKTOTAL, CKMB, CKMBINDEX, TROPONINI,  in the last 168 hours  BNP (last 3 results) No results found for this basename: PROBNP,  in the last 8760 hours CBG: No results found for this basename: GLUCAP,  in the last 168 hours  Radiological Exams on Admission: US Abdomen Complete  08/23/2013   CLINICAL DATA:  48 year old male with abdominal pain, nausea, vomiting and diarrhea. HIV positive. History of cholecystectomy.  EXAM: ULTRASOUND ABDOMEN COMPLETE  COMPARISON:  07/23/2012 CT and 02/09/2012 ultrasound  FINDINGS: Gallbladder:  Not visualized compatible with cholecystectomy.  Common bile duct:  Diameter: 3.9 mm. There is no evidence of intrahepatic or extrahepatic biliary dilatation. The visualized CBD is unremarkable.  Liver:  A 1 cm hyperechoic mass in the lateral left liver is unchanged from 2013 likely representing a hemangioma. Within normal limits in parenchymal echogenicity.  IVC:  No abnormality visualized.  Pancreas:  Visualized portion unremarkable.  Spleen:  Size and appearance within normal limits.  Right Kidney:  Length: 10.9 cm. Echogenicity within normal limits. No suspicious mass or hydronephrosis visualized.  Left Kidney:  Length: 10.4 cm. Echogenicity within normal limits. No mass or hydronephrosis visualized.  Abdominal aorta:  No aneurysm visualized.  Other findings:  None.  IMPRESSION: No evidence of acute abnormality.  Status post cholecystectomy.   Electronically Signed    By: Hassan Rowan M.D.   On: 08/23/2013 15:40   Acute Abdominal Series  08/23/2013   CLINICAL DATA:  Nausea and vomiting ; HIV positive  EXAM: ACUTE ABDOMEN SERIES (ABDOMEN 2 VIEW & CHEST 1 VIEW)  COMPARISON:  Chest radiograph February 09, 2012; CT abdomen and pelvis Jul 23, 2012  FINDINGS: PA chest: No edema or consolidation. Lungs are mildly hyperexpanded. The heart size and pulmonary vascularity are normal. Patient is status post aortic valve replacement.  Supine and upright abdomen: The bowel gas pattern is unremarkable. No obstruction or free air. No abnormal calcifications. There are surgical clips in the gallbladder fossa region.  IMPRESSION: Bowel gas pattern unremarkable. Lungs mildly hyperexpanded but clear.   Electronically Signed   By: Lowella Grip M.D.   On: 08/23/2013 15:48    EKG:   Assessment/Plan Active Problems:   Nausea & vomiting   Abdominal pain   S/P AVR (aortic valve replacement)   AIDS   Chronic pancreatitis   Adjustment disorder with mixed anxiety and depressed mood   HTN (hypertension)   Intractable nausea and vomiting   Diarrhea   HIV positive   HIV positive -Continue patient's antiretrovirals. Patient able to take antiretrovirals if he eats crackers first. -Continue n.p.o. other than crackers and antiretrovirals -Obtain blood cultures, urine cultures -Start empiric broad spectrum antibiotic  Abdominal pain -Dilaudid 1 mg q 3hr PRN  Tractable nausea vomiting -IV Zofran, and Thorazine if Zofran ineffective -Consult GI in the a.m. EGD/colonoscopy?  Diarrhea -Obtain stool sample for culture, WBC  -GI pathogen by PCR pending -N.p.o. -Ensure patient states hydrated normal saline at 178ml/hr  HTN -Mildly elevated however would not attend to lower during his acute illness  AVR replacement -Continue heparin drip per pharmacy -Continue warfarin per pharmacy (currently holding secondary to possible procedure by GI)  Chronic pancreatitis? -Lipase  pending   Code Status: Full Family Communication: None Disposition Plan: Per GI  Time spent: 60 minutes  Allie Bossier [facility] Pager [phone]  If 7PM-7AM, please contact night-coverage [email or web address] Password [facility] 08/23/2013, 7:43 PM

## 2013-08-23 NOTE — Progress Notes (Signed)
ANTIBIOTIC CONSULT NOTE - INITIAL  Pharmacy Consult for: Vancomycin/Zosyn Indication: Sepsis   Allergies  Allergen Reactions  . Bactrim [Sulfamethoxazole-Trimethoprim]   . Bee Venom Anaphylaxis  . Sulfa Antibiotics Anaphylaxis  . Truvada [Emtricitabine-Tenofovir] Anaphylaxis    Takes plain tenofovir at home  . Lidoderm [Lidocaine] Other (See Comments)    Reaction unknown  . Raltegravir     resistance  . Sulfamethoxazole Itching  . Ceftriaxone Rash    Patient Measurements: Weight: 102 lb 1.6 oz (46.312 kg)   Vital Signs: Temp: 98.7 F (37.1 C) (06/15 1305) Temp src: Oral (06/15 1305) BP: 146/78 mmHg (06/15 1305) Pulse Rate: 69 (06/15 1305) Intake/Output from previous day:   Intake/Output from this shift:    Labs:  Recent Labs  08/23/13 1126 08/23/13 1435  WBC 3.4* 3.9*  HGB 14.1 13.1  PLT 173 149*  CREATININE 1.61* 1.51*   The CrCl is unknown because both a height and weight (above a minimum accepted value) are required for this calculation. No results found for this basename: VANCOTROUGH, VANCOPEAK, VANCORANDOM, GENTTROUGH, GENTPEAK, GENTRANDOM, TOBRATROUGH, TOBRAPEAK, TOBRARND, AMIKACINPEAK, AMIKACINTROU, AMIKACIN,  in the last 72 hours   Microbiology: No results found for this or any previous visit (from the past 720 hour(s)).  Medical History: Past Medical History  Diagnosis Date  . HIV (human immunodeficiency virus infection)   . Hypertension   . Stroke   . Pancreatitis   . Mechanical heart valve present   . Arthritis   . Anemia   . GERD (gastroesophageal reflux disease)   . Abdominal pain   . Weight loss, unintentional   . Constipation   . Nausea & vomiting   . Diarrhea     Medications:  Scheduled:  . dapsone  100 mg Oral Daily  . darunavir  600 mg Oral BID WC  . dolutegravir  50 mg Oral BID  . escitalopram  20 mg Oral Daily  . fluticasone  2 spray Each Nare Daily  . lamiVUDine-zidovudine  1 tablet Oral BID  . ritonavir  100 mg Oral  BID WC  . tenofovir  300 mg Oral Daily  . Warfarin - Pharmacist Dosing Inpatient   Does not apply q1800   Infusions:  . sodium chloride 125 mL/hr at 08/23/13 1635   PRN: chlorproMAZINE (THORAZINE) IV, HYDROmorphone (DILAUDID) injection, ondansetron (ZOFRAN) IV, ondansetron Assessment: 48 yo M with HIV/AIDS and MDR virus with intractable N/V and diarrhea, starting empiric vancomycin/zosyn to r/o sepsis   Antimicrobials 6/15 >> Vancomycin >>  6/15 >> Zosyn >>   Labs WBC: low 3.9 Renal: Scr elevated 1.51, estimated CrCl 40 ml/min  Temp: AF  Microbiology 6/15 Blood x 2: Sent 6/15 Urine: Sent   Goal of Therapy:  Vancomycin trough level 15-20 mcg/ml Zosyn per renal function   Plan:  1.) vancomycin 500 mg IV q12h 2.) Primaxin 250 mg IV q8h - Rocephin allergy causing Rash 3.) Monitor renal function, check VT as needed, f/u cultures, f/u r/o C.diff/need for flagyl   Glee Arvin PharmD Pager #: [phone] 8:05 PM 08/23/2013

## 2013-08-24 ENCOUNTER — Inpatient Hospital Stay (HOSPITAL_COMMUNITY): Payer: Medicaid Other

## 2013-08-24 DIAGNOSIS — R112 Nausea with vomiting, unspecified: Principal | ICD-10-CM

## 2013-08-24 DIAGNOSIS — F329 Major depressive disorder, single episode, unspecified: Secondary | ICD-10-CM

## 2013-08-24 DIAGNOSIS — F3289 Other specified depressive episodes: Secondary | ICD-10-CM

## 2013-08-24 DIAGNOSIS — E43 Unspecified severe protein-calorie malnutrition: Secondary | ICD-10-CM | POA: Insufficient documentation

## 2013-08-24 LAB — PROTIME-INR
INR: 2.52 — ABNORMAL HIGH (ref 0.00–1.49)
INR: 3.29 — AB (ref 0.00–1.49)
Prothrombin Time: 26.3 seconds — ABNORMAL HIGH (ref 11.6–15.2)
Prothrombin Time: 32.3 seconds — ABNORMAL HIGH (ref 11.6–15.2)

## 2013-08-24 LAB — HIV-1 RNA ULTRAQUANT REFLEX TO GENTYP+
HIV 1 RNA Quant: <20 copies/mL (ref <20)
HIV-1 RNA Quant, Log: <1.3 {Log} (ref <1.30)

## 2013-08-24 LAB — T-HELPER CELL (CD4) - (RCID CLINIC ONLY)
CD4 % Helper T Cell: 8 % — ABNORMAL LOW (ref 33–55)
CD4 T Cell Abs: 140 /uL — ABNORMAL LOW (ref 400–2700)

## 2013-08-24 LAB — OCCULT BLOOD X 1 CARD TO LAB, STOOL: FECAL OCCULT BLD: NEGATIVE

## 2013-08-24 MED ORDER — BOOST / RESOURCE BREEZE PO LIQD
1.0000 | Freq: Two times a day (BID) | ORAL | Status: DC
  Administered 2013-08-24 – 2013-08-25 (×2): 1 via ORAL

## 2013-08-24 MED ORDER — SODIUM CHLORIDE 0.9 % IV SOLN
250.0000 mg | Freq: Four times a day (QID) | INTRAVENOUS | Status: DC
  Filled 2013-08-24: qty 250

## 2013-08-24 MED ORDER — ENSURE COMPLETE PO LIQD
237.0000 mL | Freq: Two times a day (BID) | ORAL | Status: DC
  Administered 2013-08-24: 237 mL via ORAL

## 2013-08-24 MED ORDER — GLYCOPYRROLATE 1 MG PO TABS
2.0000 mg | ORAL_TABLET | Freq: Two times a day (BID) | ORAL | Status: DC
  Administered 2013-08-24 – 2013-08-25 (×2): 2 mg via ORAL
  Filled 2013-08-24 (×3): qty 2

## 2013-08-24 NOTE — Consult Note (Signed)
Referring Provider: No ref. provider found Primary Care Physician:  [name], MD Primary Gastroenterologist:  None, unassigned  Reason for Consultation:  Nausea and vomiting  HPI: Brandon [name] is a 48 y.o. 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 Apparently he had been taken off ARV and placed with hospice in the past.  He has signed out AMA from the hospital on previous admission.   We has been seen by ID here in [place] and they placed him on a salvage regimen of Prezista 600mg  twice daily boosted with Norvir 100mg  twice daily, Tivicay twice daily, Combivir twice daily and once daily Viread.  Since then he has now an UNDETECTABLE VIRAL LOAD <20. And CD4 >200 when checked in April; just yesterday CD4 was 140. He had maintained perfect virological suppresion for past 12 months.   He continues to suffer from chronic nausea and vomiting, which is why GI has been called.  He tells me that these symptoms have been present since at least 2000.  Symptoms are intermittent and sometimes he goes 3 months without any.  When the symptoms begin sometimes they last a couple of days and resolve, but other times they persist and he comes to the ED.  He has phenergan suppositories, zofran ODT, and compazine at home but he says that once the vomiting starts then nothing helps.  He has even tried taking some of the anti-emetics scheduled on a regular basis to try and prevent recurrence but it has not seemed to help.  He says that he had his gallbladder out as well but that did not make a difference in the nausea and vomiting.  He also complains of upper abdominal pains and says that those got a little better with the cholecystectomy.  Says that the nausea is usually worse in the AM, not necessarily associated with eating, but varies and can wake him from sleep at night as well.   Also seems to occur with BM's; says that he gets sweats and then nausea sometimes with BM's.  Denies hematemesis or CGE.  Says that when he has these episodes then he cannot keep his medication down and sometimes cannot keep liquids down.  One year ago (07/2012) he had an upper endoscopy performed by Carol Ada which showed no esophageal abnormalities and a medium nonbleeding gastric avascular malformation.   Says that he has mostly diarrhea, but it alternates with constipation.  Had two episodes of diarrhea here today and stool studies are pending.  Sees occasional bright red blood with stools.  Says that he had a colonoscopy several years ago.  He is FOBT negative.  Routine labs are relatively unremarkable.  Just of note, he had ultrasound on this admission that showed no acute abnormalities.  INR is 2.52 today.  He is on pantoprazole 40 mg daily at home (but not on PPI here), but also takes several narcotics and Marinol.  He is under the impression that we will be performing EGD and colonoscopy here in the hospital.   Past Medical History  Diagnosis Date  . HIV (human immunodeficiency virus infection)   . Hypertension   . Stroke   . Pancreatitis   . Mechanical heart valve present   . Arthritis   . Anemia   . GERD (gastroesophageal reflux disease)   . Abdominal pain   . Weight loss, unintentional   . Constipation   .  Nausea & vomiting   . Diarrhea     Past Surgical History  Procedure Laterality Date  . Cardiac surgery    . Knee surgery    . Cholecystectomy  02/12/2012    Procedure: LAPAROSCOPIC CHOLECYSTECTOMY;  Surgeon: Stark Klein, MD;  Location: [facility];  Service: General;  Laterality: N/A;  . Esophagogastroduodenoscopy N/A 07/24/2012    Procedure: ESOPHAGOGASTRODUODENOSCOPY (EGD);  Surgeon: Beryle Beams, MD;  Location: [facility] ENDOSCOPY;  Service: Endoscopy;  Laterality: N/A;  . Aortic valve replacement      Prior to Admission medications   Medication Sig Start Date End Date  Taking? Authorizing Provider  dapsone 100 MG tablet Take 100 mg by mouth daily.   Yes Historical Provider, MD  darunavir (PREZISTA) 600 MG tablet Take 600 mg by mouth 2 (two) times daily with a meal.   Yes Historical Provider, MD  dolutegravir (TIVICAY) 50 MG tablet Take 50 mg by mouth 2 (two) times daily.   Yes Historical Provider, MD  dronabinol (MARINOL) 5 MG capsule Take 1 capsule (5 mg total) by mouth 2 (two) times daily before a meal. 07/19/13  Yes Truman Hayward, MD  ENSURE (ENSURE) Take 237 mLs by mouth 2 (two) times daily between meals. Provide a case per month: BMI<20 07/19/13  Yes Truman Hayward, MD  EPINEPHrine (EPI-PEN) 0.3 mg/0.3 mL DEVI Inject 0.3 mLs (0.3 mg total) into the muscle once. 08/20/12  Yes Truman Hayward, MD  escitalopram (LEXAPRO) 20 MG tablet Take 1 tablet (20 mg total) by mouth daily. 07/19/13  Yes Truman Hayward, MD  lamiVUDine-zidovudine (COMBIVIR) 150-300 MG per tablet Take 1 tablet by mouth 2 (two) times daily.   Yes Historical Provider, MD  LORazepam (ATIVAN) 1 MG tablet Take 1 mg by mouth every 8 (eight) hours as needed for anxiety.   Yes Historical Provider, MD  megestrol (MEGACE) 40 MG/ML suspension Take 800 mg by mouth daily.   Yes Historical Provider, MD  morphine (MS CONTIN) 15 MG 12 hr tablet Take 1 tablet (15 mg total) by mouth 2 (two) times daily. 07/19/13  Yes Truman Hayward, MD  morphine (MSIR) 15 MG tablet Take 1 tablet (15 mg total) by mouth 3 (three) times daily as needed. 07/19/13  Yes Truman Hayward, MD  ondansetron (ZOFRAN-ODT) 8 MG disintegrating tablet Take 8 mg by mouth every 8 (eight) hours as needed for nausea or vomiting.   Yes Historical Provider, MD  pantoprazole (PROTONIX) 40 MG tablet Take 40 mg by mouth daily.   Yes Historical Provider, MD  prochlorperazine (COMPAZINE) 10 MG tablet Take 1 tablet (10 mg total) by mouth every 6 (six) hours as needed for nausea or vomiting. 07/19/13  Yes Truman Hayward, MD    ritonavir (NORVIR) 100 MG TABS tablet Take 1 tablet (100 mg total) by mouth 2 (two) times daily with a meal. 07/14/13  Yes Truman Hayward, MD  tenofovir (VIREAD) 300 MG tablet Take 300 mg by mouth daily.   Yes Historical Provider, MD  valACYclovir (VALTREX) 1000 MG tablet Take 1 tablet (1,000 mg total) by mouth daily. 06/02/12  Yes Truman Hayward, MD  warfarin (COUMADIN) 5 MG tablet Take 5-6 mg by mouth See admin instructions. Takes 6mg  on Monday, Wednesday, Friday and Takes 5mg  on Tuesday, Thursday, Saturday, and Sunday   Yes Historical Provider, MD  zolpidem (AMBIEN) 10 MG tablet Take 1 tablet (10 mg total) by mouth at bedtime  as needed for sleep. Take 1 tablet (10 mg total) by mouth nightly as needed for Sleep. May repeat times one as needed 05/24/13  Yes Truman Hayward, MD  flunisolide (NASALIDE) 25 MCG/ACT (0.025%) SOLN Place 2 sprays into the nose at bedtime as needed. 03/17/13   Truman Hayward, MD    Current Facility-Administered Medications  Medication Dose Route Frequency Provider Last Rate Last Dose  . 0.9 %  sodium chloride infusion   Intravenous Continuous Allie Bossier, MD 125 mL/hr at 08/24/13 0050    . chlorproMAZINE (THORAZINE) 12.5 mg in sodium chloride 0.9 % 25 mL IVPB  12.5 mg Intravenous Q6H PRN Allie Bossier, MD      . dapsone tablet 100 mg  100 mg Oral Daily Allie Bossier, MD   100 mg at 08/24/13 1119  . darunavir (PREZISTA) tablet 600 mg  600 mg Oral BID WC Allie Bossier, MD   600 mg at 08/24/13 0900  . diphenhydrAMINE (BENADRYL) capsule 25 mg  25 mg Oral Q6H PRN Gardiner Barefoot, NP   25 mg at 08/24/13 0904  . dolutegravir (TIVICAY) tablet 50 mg  50 mg Oral BID Allie Bossier, MD   50 mg at 08/24/13 1118  . escitalopram (LEXAPRO) tablet 20 mg  20 mg Oral Daily Allie Bossier, MD   20 mg at 08/24/13 1118  . fluticasone (FLONASE) 50 MCG/ACT nasal spray 2 spray  2 spray Each Nare Daily Angela Adam, RPH   2 spray at 08/23/13 1600  . HYDROmorphone  (DILAUDID) injection 1 mg  1 mg Intravenous Q3H PRN Allie Bossier, MD   1 mg at 08/24/13 0900  . imipenem-cilastatin (PRIMAXIN) 250 mg in sodium chloride 0.9 % 100 mL IVPB  250 mg Intravenous 4 times per day Joycelyn Rua, RPH      . lamiVUDine-zidovudine (COMBIVIR) 150-300 MG per tablet 1 tablet  1 tablet Oral BID Allie Bossier, MD   1 tablet at 08/24/13 1118  . ondansetron (ZOFRAN) tablet 4 mg  4 mg Oral Q6H PRN Allie Bossier, MD       Or  . ondansetron [facility]) injection 4 mg  4 mg Intravenous Q6H PRN Allie Bossier, MD   4 mg at 08/24/13 0049  . ritonavir (NORVIR) tablet 100 mg  100 mg Oral BID WC Allie Bossier, MD   100 mg at 08/24/13 0900  . tenofovir (VIREAD) tablet 300 mg  300 mg Oral Daily Allie Bossier, MD   300 mg at 08/24/13 1118  . vancomycin (VANCOCIN) 500 mg in sodium chloride 0.9 % 100 mL IVPB  500 mg Intravenous Q12H Gaye Alken [place]ger[place], RPH   500 mg at 08/24/13 0820  . Warfarin - Pharmacist Dosing Inpatient   Does not apply Fossil, RPH       Facility-Administered Medications Ordered in Other Encounters  Medication Dose Route Frequency Provider Last Rate Last Dose  . 0.9 %  sodium chloride infusion   Intravenous Once Campbell Riches, MD        Allergies as of 08/23/2013 - Review Complete 08/23/2013  Allergen Reaction Noted  . Bactrim [sulfamethoxazole-trimethoprim]  08/26/2012  . Bee venom Anaphylaxis 04/12/2011  . Sulfa antibiotics Anaphylaxis 04/11/2011  . Truvada [emtricitabine-tenofovir] Anaphylaxis 04/11/2011  . Lidoderm [lidocaine] Other (See Comments) 02/09/2012  . Penicillins Other (See Comments) 08/23/2013  . Raltegravir  05/14/2012  . Sulfamethoxazole Itching 05/14/2012  . Ceftriaxone Rash 03/12/2012  Family History  Problem Relation Age of Onset  . Hypertension Father   . Cancer - Prostate Father   . Cancer Father     stomach  . Hypertension Sister   . Diabetes Maternal Aunt   . Cancer - Other Cousin   .  Parkinson's disease Paternal Aunt     History   Social History  . Marital Status: Married    Spouse Name: N/A    Number of Children: N/A  . Years of Education: N/A   Occupational History  . Not on file.   Social History Main Topics  . Smoking status: Current Every Day Smoker -- 0.50 packs/day for 20 years  . Smokeless tobacco: Never Used  . Alcohol Use: Yes     Comment: 12oz beer/ per week   . Drug Use: 7.00 per week    Special: Marijuana     Comment: daily 2 joints   . Sexual Activity: Yes    Partners: Female    Birth Control/ Protection: Condom   Other Topics Concern  . Not on file   Social History Narrative  . No narrative on file    Review of Systems: Ten point ROS is O/W negative except as mentioned in HPI.  Physical Exam: Vital signs in last 24 hours: Temp:  [97.1 F (36.2 C)-98.4 F (36.9 C)] 98.4 F (36.9 C) (06/16 0623) Pulse Rate:  [64-101] 101 (06/16 0623) Resp:  [18-20] 18 (06/16 0623) BP: (95-154)/(60-94) 95/60 mmHg (06/16 0623) SpO2:  [92 %-96 %] 92 % (06/16 0623) Weight:  [110 lb 4.8 oz (50.032 kg)] 110 lb 4.8 oz (50.032 kg) (06/16 0500) Last BM Date: 08/23/13 General:  Alert, thin, pleasant and cooperative in NAD Head:  Normocephalic and atraumatic.  Temporal muscle wasting. Eyes:  Sclera clear, no icterus.  Conjunctiva pink. Ears:  Normal auditory acuity. Mouth:  No deformity or lesions.   Lungs:  Clear throughout to auscultation.  No wheezes, crackles, or rhonchi.  Heart:  Regular rate and rhythm; click noted from AVR. Abdomen:  Soft, non-distended.  BS present.  Non-tender.   Rectal:  Deferred  Msk:  Symmetrical without gross deformities. Pulses:  Normal pulses noted. Extremities:  Without clubbing or edema. Neurologic:  Alert and  oriented x4;  grossly normal neurologically. Skin:  Intact without significant lesions or rashes. Psych:  Alert and cooperative. Normal mood and affect.  Intake/Output from previous day: 06/15 0701 - 06/16  0700 In: 1650 [I.V.:1450; IV Piggyback:200] Out: 700 [Urine:700]  Lab Results:  Recent Labs  08/23/13 1126 08/23/13 1435  WBC 3.4* 3.9*  HGB 14.1 13.1  HCT 39.7 37.0*  PLT 173 149*   BMET  Recent Labs  08/23/13 1126 08/23/13 1435  NA 142 141  K 3.8 3.4*  CL 101 102  CO2 30 28  GLUCOSE 98 86  BUN 14 15  CREATININE 1.61* 1.51*  CALCIUM 9.6 9.2   LFT  Recent Labs  08/23/13 1435  PROT 6.7  ALBUMIN 3.8  AST 19  ALT 17  ALKPHOS 83  BILITOT 0.6   PT/INR  Recent Labs  08/23/13 1552 08/24/13 0500  LABPROT 24.6* 26.3*  INR 2.31* 2.52*   Studies/Results: Dg Chest 2 View  08/24/2013   CLINICAL DATA:  HIV positive, smoker, history hypertension, stroke, AVR  EXAM: CHEST  2 VIEW  COMPARISON:  08/23/2013  FINDINGS: Normal heart size post median sternotomy and AVR.  Mediastinal contours and pulmonary vascularity normal.  Lungs hyperinflated but clear.  No pleural effusion  or pneumothorax.  No acute osseous findings.  IMPRESSION: Post AVR.  Hyperinflated lungs without acute infiltrate.   Electronically Signed   By: Lavonia Dana M.D.   On: 08/24/2013 10:33   US Abdomen Complete  08/23/2013   CLINICAL DATA:  48 year old male with abdominal pain, nausea, vomiting and diarrhea. HIV positive. History of cholecystectomy.  EXAM: ULTRASOUND ABDOMEN COMPLETE  COMPARISON:  07/23/2012 CT and 02/09/2012 ultrasound  FINDINGS: Gallbladder:  Not visualized compatible with cholecystectomy.  Common bile duct:  Diameter: 3.9 mm. There is no evidence of intrahepatic or extrahepatic biliary dilatation. The visualized CBD is unremarkable.  Liver:  A 1 cm hyperechoic mass in the lateral left liver is unchanged from 2013 likely representing a hemangioma. Within normal limits in parenchymal echogenicity.  IVC:  No abnormality visualized.  Pancreas:  Visualized portion unremarkable.  Spleen:  Size and appearance within normal limits.  Right Kidney:  Length: 10.9 cm. Echogenicity within normal limits. No  suspicious mass or hydronephrosis visualized.  Left Kidney:  Length: 10.4 cm. Echogenicity within normal limits. No mass or hydronephrosis visualized.  Abdominal aorta:  No aneurysm visualized.  Other findings:  None.  IMPRESSION: No evidence of acute abnormality.  Status post cholecystectomy.   Electronically Signed   By: Hassan Rowan M.D.   On: 08/23/2013 15:40   Acute Abdominal Series  08/23/2013   CLINICAL DATA:  Nausea and vomiting ; HIV positive  EXAM: ACUTE ABDOMEN SERIES (ABDOMEN 2 VIEW & CHEST 1 VIEW)  COMPARISON:  Chest radiograph February 09, 2012; CT abdomen and pelvis Jul 23, 2012  FINDINGS: PA chest: No edema or consolidation. Lungs are mildly hyperexpanded. The heart size and pulmonary vascularity are normal. Patient is status post aortic valve replacement.  Supine and upright abdomen: The bowel gas pattern is unremarkable. No obstruction or free air. No abnormal calcifications. There are surgical clips in the gallbladder fossa region.  IMPRESSION: Bowel gas pattern unremarkable. Lungs mildly hyperexpanded but clear.   Electronically Signed   By: Lowella Grip M.D.   On: 08/23/2013 15:48    IMPRESSION:  -Recurrent intermittent nausea and vomiting, chronic for 15 years:  No improvement post cholecystectomy.  ? Related to medications (is on several narcotics at home).  Also smokes marijuana daily (? If it could be related). -Alternating constipation with diarrhea:  Had two episodes of diarrhea today.  Stool studies pending. -HIV:  Since 1995.  Multi-drug resistant. -AVR replacement in 1997 requiring coumadin anticoagulation:  INR 2.52 today.  PLAN: -Per Dr. Carlean Purl.  It seems that patient is under the impression that we are going to perform EGD and colonoscopy while he is here.  I am not quite sure that he needs repeat EGD as one just last year was normal/unremarkable.  His INR is 2.52 today so likely no procedures will be performed tomorrow. -Anti-emetics prn. -Await stool  studies.   ZEHR, JESSICA D.  08/24/2013, 1:47 PM  Pager number [phone]  [facility] Attending  I have also seen and assessed the patient and agree with the above note.  He has Grade 3-4 hemorrhoids on inspection of anal area. These causing blood w/ wiping at times.  I think he probably has IBS but interestingly says he gets sweaty and nauseous during defecation - intermittent - x 15 yrs.  Rare constipation. Says he has had pre-syncope with defecation at times.  I wonder if he does not have some sort of enhanced vagal tone issue. Will try glycopyrrolate which could help all of the  above. Do not see clear need for any endoscopic evaluation.  He is concerned about father's hx gastric cancer but with negative EGD last year and no recommendations for screening would not do an EGD.   Other possibility is cannabis hyperemesis syndrome but that is usually more discrete episodic vomiting and pain. I did tell him to stop marijuana.  Gatha Mayer, MD, [facility] [phone] (pager) 08/24/2013 6:16 PM

## 2013-08-24 NOTE — Progress Notes (Signed)
[facility] for Infectious Disease  Day # 2, imipenem .Marland Kitchen.for what???  Subjective: C/o headache   Antibiotics:  Anti-infectives   Start     Dose/Rate Route Frequency Ordered Stop   08/24/13 1200  imipenem-cilastatin (PRIMAXIN) 250 mg in sodium chloride 0.9 % 100 mL IVPB  Status:  Discontinued     250 mg 200 mL/hr over 30 Minutes Intravenous 4 times per day 08/24/13 0843 08/24/13 1354   08/23/13 2200  imipenem-cilastatin (PRIMAXIN) 250 mg in sodium chloride 0.9 % 100 mL IVPB  Status:  Discontinued     250 mg 200 mL/hr over 30 Minutes Intravenous 3 times per day 08/23/13 2017 08/24/13 0843   08/23/13 2100  vancomycin (VANCOCIN) 500 mg in sodium chloride 0.9 % 100 mL IVPB  Status:  Discontinued     500 mg 100 mL/hr over 60 Minutes Intravenous Every 12 hours 08/23/13 2017 08/24/13 1354   08/23/13 1700  darunavir (PREZISTA) tablet 600 mg     600 mg Oral 2 times daily with meals 08/23/13 1438     08/23/13 1700  ritonavir (NORVIR) tablet 100 mg     100 mg Oral 2 times daily with meals 08/23/13 1438     08/23/13 1530  dolutegravir (TIVICAY) tablet 50 mg     50 mg Oral 2 times daily 08/23/13 1438     08/23/13 1530  dapsone tablet 100 mg     100 mg Oral Daily 08/23/13 1438     08/23/13 1530  lamiVUDine-zidovudine (COMBIVIR) 150-300 MG per tablet 1 tablet     1 tablet Oral 2 times daily 08/23/13 1438     08/23/13 1530  tenofovir (VIREAD) tablet 300 mg     300 mg Oral Daily 08/23/13 1438     08/23/13 1530  valACYclovir (VALTREX) tablet 1,000 mg  Status:  Discontinued     1,000 mg Oral Daily 08/23/13 1438 08/23/13 1456      Medications: Scheduled Meds: . dapsone  100 mg Oral Daily  . darunavir  600 mg Oral BID WC  . dolutegravir  50 mg Oral BID  . escitalopram  20 mg Oral Daily  . fluticasone  2 spray Each Nare Daily  . lamiVUDine-zidovudine  1 tablet Oral BID  . ritonavir  100 mg Oral BID WC  . tenofovir  300 mg Oral Daily  . Warfarin - Pharmacist Dosing Inpatient   Does not  apply q1800   Continuous Infusions: . sodium chloride 125 mL/hr at 08/24/13 0050   PRN Meds:.chlorproMAZINE (THORAZINE) IV, diphenhydrAMINE, HYDROmorphone (DILAUDID) injection, ondansetron (ZOFRAN) IV, ondansetron    Objective: Weight change:   Intake/Output Summary (Last 24 hours) at 08/24/13 1355 Last data filed at 08/24/13 2505  Gross per 24 hour  Intake   1650 ml  Output    700 ml  Net    950 ml   Blood pressure 95/60, pulse 101, temperature 98.4 F (36.9 C), temperature source Oral, resp. rate 18, height 5' 8.9" (1.75 m), weight 110 lb 4.8 oz (50.032 kg), SpO2 92.00%. Temp:  [97.1 F (36.2 C)-98.4 F (36.9 C)] 98.4 F (36.9 C) (06/16 0623) Pulse Rate:  [64-101] 101 (06/16 0623) Resp:  [18-20] 18 (06/16 0623) BP: (95-154)/(60-94) 95/60 mmHg (06/16 0623) SpO2:  [92 %-96 %] 92 % (06/16 0623) Weight:  [110 lb 4.8 oz (50.032 kg)] 110 lb 4.8 oz (50.032 kg) (06/16 0500)  Physical Exam: General: Alert and awake, oriented x3, not in any acute distress. Co HA and back pain  latter due to bed HEENT: anicteric sclera,, EOMI CVS regular rate, normal r,  no murmur rubs or gallops Chest: clear to auscultation bilaterally, no wheezing, rales or rhonchi Abdomen: soft tender throughout, nondistended, normal bowel sounds, Extremities: no  clubbing or edema noted bilaterally Skin: no rashes Neuro: nonfocal  CBC:  Recent Labs Lab 08/23/13 1126 08/23/13 1435 08/23/13 1552 08/24/13 0500  HGB 14.1 13.1  --   --   HCT 39.7 37.0*  --   --   PLT 173 149*  --   --   INR  --   --  2.31* 2.52*     BMET  Recent Labs  08/23/13 1126 08/23/13 1435  NA 142 141  K 3.8 3.4*  CL 101 102  CO2 30 28  GLUCOSE 98 86  BUN 14 15  CREATININE 1.61* 1.51*  CALCIUM 9.6 9.2     Liver Panel   Recent Labs  08/23/13 1126 08/23/13 1435  PROT 7.2 6.7  ALBUMIN 4.1 3.8  AST 19 19  ALT 16 17  ALKPHOS 92 83  BILITOT 0.7 0.6       Sedimentation Rate No results found for this  basename: ESRSEDRATE,  in the last 72 hours C-Reactive Protein No results found for this basename: CRP,  in the last 72 hours  Micro Results: Recent Results (from the past 240 hour(s))  CULTURE, BLOOD (ROUTINE X 2)     Status: None   Collection Time    08/23/13  2:35 PM      Result Value Ref Range Status   Specimen Description BLOOD LEFT HAND   Final   Special Requests BOTTLES DRAWN AEROBIC AND ANAEROBIC 10CC   Final   Culture  Setup Time     Final   Value: 08/23/2013 19:37     Performed at [facility]   Culture     Final   Value:        BLOOD CULTURE RECEIVED NO GROWTH TO DATE CULTURE WILL BE HELD FOR 5 DAYS BEFORE ISSUING A FINAL NEGATIVE REPORT     Performed at [facility]   Report Status PENDING   Incomplete  CULTURE, BLOOD (ROUTINE X 2)     Status: None   Collection Time    08/23/13  2:50 PM      Result Value Ref Range Status   Specimen Description BLOOD LEFT ARM   Final   Special Requests BOTTLES DRAWN AEROBIC AND ANAEROBIC 10CC   Final   Culture  Setup Time     Final   Value: 08/23/2013 19:37     Performed at [facility]   Culture     Final   Value:        BLOOD CULTURE RECEIVED NO GROWTH TO DATE CULTURE WILL BE HELD FOR 5 DAYS BEFORE ISSUING A FINAL NEGATIVE REPORT     Performed at [facility]   Report Status PENDING   Incomplete    Studies/Results: Dg Chest 2 View  08/24/2013   CLINICAL DATA:  HIV positive, smoker, history hypertension, stroke, AVR  EXAM: CHEST  2 VIEW  COMPARISON:  08/23/2013  FINDINGS: Normal heart size post median sternotomy and AVR.  Mediastinal contours and pulmonary vascularity normal.  Lungs hyperinflated but clear.  No pleural effusion or pneumothorax.  No acute osseous findings.  IMPRESSION: Post AVR.  Hyperinflated lungs without acute infiltrate.   Electronically Signed   By: Lavonia Dana M.D.   On: 08/24/2013 10:33  US Abdomen Complete  08/23/2013   CLINICAL DATA:  48 year old male with abdominal pain,  nausea, vomiting and diarrhea. HIV positive. History of cholecystectomy.  EXAM: ULTRASOUND ABDOMEN COMPLETE  COMPARISON:  07/23/2012 CT and 02/09/2012 ultrasound  FINDINGS: Gallbladder:  Not visualized compatible with cholecystectomy.  Common bile duct:  Diameter: 3.9 mm. There is no evidence of intrahepatic or extrahepatic biliary dilatation. The visualized CBD is unremarkable.  Liver:  A 1 cm hyperechoic mass in the lateral left liver is unchanged from 2013 likely representing a hemangioma. Within normal limits in parenchymal echogenicity.  IVC:  No abnormality visualized.  Pancreas:  Visualized portion unremarkable.  Spleen:  Size and appearance within normal limits.  Right Kidney:  Length: 10.9 cm. Echogenicity within normal limits. No suspicious mass or hydronephrosis visualized.  Left Kidney:  Length: 10.4 cm. Echogenicity within normal limits. No mass or hydronephrosis visualized.  Abdominal aorta:  No aneurysm visualized.  Other findings:  None.  IMPRESSION: No evidence of acute abnormality.  Status post cholecystectomy.   Electronically Signed   By: Hassan Rowan M.D.   On: 08/23/2013 15:40   Acute Abdominal Series  08/23/2013   CLINICAL DATA:  Nausea and vomiting ; HIV positive  EXAM: ACUTE ABDOMEN SERIES (ABDOMEN 2 VIEW & CHEST 1 VIEW)  COMPARISON:  Chest radiograph February 09, 2012; CT abdomen and pelvis Jul 23, 2012  FINDINGS: PA chest: No edema or consolidation. Lungs are mildly hyperexpanded. The heart size and pulmonary vascularity are normal. Patient is status post aortic valve replacement.  Supine and upright abdomen: The bowel gas pattern is unremarkable. No obstruction or free air. No abnormal calcifications. There are surgical clips in the gallbladder fossa region.  IMPRESSION: Bowel gas pattern unremarkable. Lungs mildly hyperexpanded but clear.   Electronically Signed   By: Lowella Grip M.D.   On: 08/23/2013 15:48      Assessment/Plan:  Active Problems:   Nausea & vomiting    Abdominal pain   S/P AVR (aortic valve replacement)   AIDS   Chronic pancreatitis   Adjustment disorder with mixed anxiety and depressed mood   HTN (hypertension)   Intractable nausea and vomiting   Diarrhea   HIV positive    Brandon [name] is a 48 y.o. male with  HIV/AIDS, currently suppressed on salvage regimen who suffers from chronic nausea, constipation, loose stools with nausea at times evolving into intractable vomiting refractory to rectal suppository phenergan, dissolvable zofran. When this happens he goes into renal failure as we saw today and risks  Developing more R to his already SUPER RESISTANT HIV virus. He has hx of AVM by upper endoscopy last year  #1, Nausea, vomiting, loose stools: I DOUBT any OI or infection present though this is possible.  I DO think he would benefit from possible repeat upper and now lower endoscopy. He tells me at [facility] they had contemplated Celiac.  --He is improving on IV anti-emetics --Dr. Benson Norway coming to see today --consider checking TTG --if he has colon biopsy would send for CMV by PCR though his CD4 is too high for this really --could also certainly be partly from narcotics and or his ARVs, the latter are not negotiable with R nature of his virus  --I have STOPPED HIS VANCOMYCIN and IMIPENEM, THERE IS NO INDICATION FOR EITHER OF THESE ANTIBIOTICS and there is risk for NEPHROTOXICITY and CDI here, I was unaware that they had been started yesterday  #2 HIV/AIDS: CD4 down slightly but % the same. Partly could  be acute illness. I expect his virus to be <20 as it has been  #3 Depression grieiving: continue SSRI     LOS: 1 day   [name] 08/24/2013, 1:55 PM

## 2013-08-24 NOTE — Progress Notes (Signed)
[facility] PROGRESS NOTE  Brandon [name] JOI:[id number] DOB: 1965/04/12 DOA: 08/23/2013 PCP: [name], MD  Assessment/Plan: Active Problems:   Nausea & vomiting - Agree with GI evaluation as such consult placed.  - Otherwise supportive therapy at this juncture - Patient requesting food this admission    Abdominal pain - supportive therapy, GI on board    AIDS/HIV positive - ID on board and managing - continue current antiviral medication    Chronic pancreatitis - stable, last lipase level 21    Adjustment disorder with mixed anxiety and depressed mood - stable patient on lexapro  H/o AVR - Pt on Coumadin, pharmacy to dose.  Code Status: full Family Communication: none at bedside Disposition Plan: Pending further recommendations from specialist   Consultants:  GI  ID  Procedures:  none   HPI/Subjective: No new complaints. Bout of emesis have resolved. Patient was reporting to me that he feels nauseous after having bowel movements.  Objective: Filed Vitals:   08/24/13 1425  BP: 101/62  Pulse: 105  Temp: 99.3 F (37.4 C)  Resp: 18    Intake/Output Summary (Last 24 hours) at 08/24/13 1628 Last data filed at 08/24/13 4709  Gross per 24 hour  Intake   1650 ml  Output    700 ml  Net    950 ml   Filed Weights   08/23/13 1305 08/24/13 0500  Weight: 46.312 kg (102 lb 1.6 oz) 50.032 kg (110 lb 4.8 oz)    Exam:   General:  Pt in NAD, alert and awake  Cardiovascular: RRR, no MRG  Respiratory: CTA BL, no wheezes  Abdomen: soft, ND  Musculoskeletal: no cyanosis or clubbing   Data Reviewed: Basic Metabolic Panel:  Recent Labs Lab 08/23/13 1126 08/23/13 1435  NA 142 141  K 3.8 3.4*  CL 101 102  CO2 30 28  GLUCOSE 98 86  BUN 14 15  CREATININE 1.61* 1.51*  CALCIUM 9.6 9.2  MG  --  1.7   Liver Function Tests:  Recent Labs Lab 08/23/13 1126 08/23/13 1435  AST 19 19  ALT 16 17  ALKPHOS 92 83  BILITOT 0.7 0.6   PROT 7.2 6.7  ALBUMIN 4.1 3.8    Recent Labs Lab 08/23/13 1435  LIPASE 18  AMYLASE 93   No results found for this basename: AMMONIA,  in the last 168 hours CBC:  Recent Labs Lab 08/23/13 1126 08/23/13 1435  WBC 3.4* 3.9*  NEUTROABS 1.5* 1.5*  HGB 14.1 13.1  HCT 39.7 37.0*  MCV 108.8* 108.8*  PLT 173 149*   Cardiac Enzymes: No results found for this basename: CKTOTAL, CKMB, CKMBINDEX, TROPONINI,  in the last 168 hours BNP (last 3 results) No results found for this basename: PROBNP,  in the last 8760 hours CBG: No results found for this basename: GLUCAP,  in the last 168 hours  Recent Results (from the past 240 hour(s))  CULTURE, BLOOD (ROUTINE X 2)     Status: None   Collection Time    08/23/13  2:35 PM      Result Value Ref Range Status   Specimen Description BLOOD LEFT HAND   Final   Special Requests BOTTLES DRAWN AEROBIC AND ANAEROBIC 10CC   Final   Culture  Setup Time     Final   Value: 08/23/2013 19:37     Performed at [facility]   Culture     Final   Value:  BLOOD CULTURE RECEIVED NO GROWTH TO DATE CULTURE WILL BE HELD FOR 5 DAYS BEFORE ISSUING A FINAL NEGATIVE REPORT     Performed at [facility]   Report Status PENDING   Incomplete  CULTURE, BLOOD (ROUTINE X 2)     Status: None   Collection Time    08/23/13  2:50 PM      Result Value Ref Range Status   Specimen Description BLOOD LEFT ARM   Final   Special Requests BOTTLES DRAWN AEROBIC AND ANAEROBIC 10CC   Final   Culture  Setup Time     Final   Value: 08/23/2013 19:37     Performed at [facility]   Culture     Final   Value:        BLOOD CULTURE RECEIVED NO GROWTH TO DATE CULTURE WILL BE HELD FOR 5 DAYS BEFORE ISSUING A FINAL NEGATIVE REPORT     Performed at [facility]   Report Status PENDING   Incomplete     Studies: Dg Chest 2 View  08/24/2013   CLINICAL DATA:  HIV positive, smoker, history hypertension, stroke, AVR  EXAM: CHEST  2 VIEW  COMPARISON:   08/23/2013  FINDINGS: Normal heart size post median sternotomy and AVR.  Mediastinal contours and pulmonary vascularity normal.  Lungs hyperinflated but clear.  No pleural effusion or pneumothorax.  No acute osseous findings.  IMPRESSION: Post AVR.  Hyperinflated lungs without acute infiltrate.   Electronically Signed   By: Lavonia Dana M.D.   On: 08/24/2013 10:33   US Abdomen Complete  08/23/2013   CLINICAL DATA:  48 year old male with abdominal pain, nausea, vomiting and diarrhea. HIV positive. History of cholecystectomy.  EXAM: ULTRASOUND ABDOMEN COMPLETE  COMPARISON:  07/23/2012 CT and 02/09/2012 ultrasound  FINDINGS: Gallbladder:  Not visualized compatible with cholecystectomy.  Common bile duct:  Diameter: 3.9 mm. There is no evidence of intrahepatic or extrahepatic biliary dilatation. The visualized CBD is unremarkable.  Liver:  A 1 cm hyperechoic mass in the lateral left liver is unchanged from 2013 likely representing a hemangioma. Within normal limits in parenchymal echogenicity.  IVC:  No abnormality visualized.  Pancreas:  Visualized portion unremarkable.  Spleen:  Size and appearance within normal limits.  Right Kidney:  Length: 10.9 cm. Echogenicity within normal limits. No suspicious mass or hydronephrosis visualized.  Left Kidney:  Length: 10.4 cm. Echogenicity within normal limits. No mass or hydronephrosis visualized.  Abdominal aorta:  No aneurysm visualized.  Other findings:  None.  IMPRESSION: No evidence of acute abnormality.  Status post cholecystectomy.   Electronically Signed   By: Hassan Rowan M.D.   On: 08/23/2013 15:40   Acute Abdominal Series  08/23/2013   CLINICAL DATA:  Nausea and vomiting ; HIV positive  EXAM: ACUTE ABDOMEN SERIES (ABDOMEN 2 VIEW & CHEST 1 VIEW)  COMPARISON:  Chest radiograph February 09, 2012; CT abdomen and pelvis Jul 23, 2012  FINDINGS: PA chest: No edema or consolidation. Lungs are mildly hyperexpanded. The heart size and pulmonary vascularity are normal. Patient  is status post aortic valve replacement.  Supine and upright abdomen: The bowel gas pattern is unremarkable. No obstruction or free air. No abnormal calcifications. There are surgical clips in the gallbladder fossa region.  IMPRESSION: Bowel gas pattern unremarkable. Lungs mildly hyperexpanded but clear.   Electronically Signed   By: Lowella Grip M.D.   On: 08/23/2013 15:48    Scheduled Meds: . dapsone  100 mg Oral Daily  . darunavir  600 mg Oral BID WC  . dolutegravir  50 mg Oral BID  . escitalopram  20 mg Oral Daily  . feeding supplement (ENSURE COMPLETE)  237 mL Oral BID WC  . feeding supplement (RESOURCE BREEZE)  1 Container Oral BID BM  . fluticasone  2 spray Each Nare Daily  . lamiVUDine-zidovudine  1 tablet Oral BID  . ritonavir  100 mg Oral BID WC  . tenofovir  300 mg Oral Daily  . Warfarin - Pharmacist Dosing Inpatient   Does not apply q1800   Continuous Infusions: . sodium chloride 125 mL/hr at 08/24/13 0050     Time spent:> 35 minutes    Velvet Bathe  [facility] Pager [phone]  If 7PM-7AM, please contact night-coverage at [email or web address], password [facility] 08/24/2013, 4:28 PM  LOS: 1 day

## 2013-08-24 NOTE — Progress Notes (Signed)
INITIAL NUTRITION ASSESSMENT  DOCUMENTATION CODES Per approved criteria  -Severe malnutrition in the context of chronic illness  Pt meets criteria for severe MALNUTRITION in the context of chronic illness as evidenced by PO intake <75% for > one month, 8% body weight loss in 3 months.   INTERVENTION: -Recommend Resource Breeze po BD, each supplement provides 250 kcal and 9 grams of protein -Recommend Ensure Complete po BID, each supplement provides 350 kcal and 13 grams of protein -Encouraged intake of small frequent meals and  high protein/kcal foods -Will continue to monitor  NUTRITION DIAGNOSIS: Inadequate oral intake related to nausea/vomiting/diarrhea as evidenced by PO intake <75%, 8% body weight loss in three months.   Goal: Pt to meet >/= 90% of their estimated nutrition needs    Monitor:  Total protein/energy intake, labs, weights, GI profile  Reason for Assessment: MST/Underweight BMI  48 y.o. male  Admitting Dx: <principal problem not specified>  ASSESSMENT: Brandon [name] is a 48 y.o. BM PMHx S/P AVR replaced 4098 highly complicated man with HIV/AIDS and Multi-DRUG RESISTANT virus formerly followed at [facility] ID. Apparently he had been taken off ARV and placed with hospice. He comes in today with worsening nausea, and vomiting. He has noticed that is the worst in the am when he goes to clinic been able to keep his antiretrovirals down on most days with the exception of proximally one day per week.   -Pt reported consuming one meal/day with 1-2 snacks. Tries to consume one Ensure Complete/day. Encouraged intake of high protein/kcal snacks, and for pt to try to eat every 2-3 hours. Cannot tolerate [employer] as this appears to worsen his diarrhea. Wife tries to promote milkshakes/smoothies; however pt feels he may have mild lactose intolerance as these drinks tend to irritate his nausea and result in GI distress -Pt has been on Resource Breeze  during previous admit in 07/2013. Tolerated this supplement w/out nausea. Informed pt of their availability and pricing, encouraged continued supplement compliance upon d/c -Chronic nausea/vomiting and abd pain decreases intake significantly, results in patient skipping 1-2 meals/day.  -Endorsed an unintentional wt loss of 10 lbs which has occurred over 3 months per previous medical records -Pt with nausea during RD assessment, informed RN. RN noted that pt refused to be NPO, has only tolerated grapes for daily PO intake  Height: Ht Readings from Last 1 Encounters:  08/23/13 5' 8.9" (1.75 m)    Weight: Wt Readings from Last 1 Encounters:  08/24/13 110 lb 4.8 oz (50.032 kg)    Ideal Body Weight: 160 lbs  % Ideal Body Weight: 145%  Wt Readings from Last 10 Encounters:  08/24/13 110 lb 4.8 oz (50.032 kg)  08/23/13 111 lb (50.349 kg)  08/05/13 109 lb (49.442 kg)  08/05/13 109 lb (49.442 kg)  07/19/13 119 lb (53.978 kg)  04/22/13 116 lb (52.617 kg)  03/17/13 118 lb (53.524 kg)  11/30/12 119 lb (53.978 kg)  10/08/12 119 lb (53.978 kg)  08/26/12 119 lb (53.978 kg)    Usual Body Weight: 120 lbs  % Usual Body Weight: 92%  BMI:  Body mass index is 16.34 kg/(m^2). Underweight  Estimated Nutritional Needs: Kcal: 1550-1750 Protein: 75-90 gram Fluid: >/=1550 ml/daily  Skin: WDL  Diet Order: General  EDUCATION NEEDS: -Education needs addressed   Intake/Output Summary (Last 24 hours) at 08/24/13 1426 Last data filed at 08/24/13 0624  Gross per 24 hour  Intake   1650 ml  Output    700 ml  Net    950 ml    Last BM: 6/15   Labs:   Recent Labs Lab 08/23/13 1126 08/23/13 1435  NA 142 141  K 3.8 3.4*  CL 101 102  CO2 30 28  BUN 14 15  CREATININE 1.61* 1.51*  CALCIUM 9.6 9.2  MG  --  1.7  GLUCOSE 98 86    CBG (last 3)  No results found for this basename: GLUCAP,  in the last 72 hours  Scheduled Meds: . dapsone  100 mg Oral Daily  . darunavir  600 mg Oral  BID WC  . dolutegravir  50 mg Oral BID  . escitalopram  20 mg Oral Daily  . fluticasone  2 spray Each Nare Daily  . lamiVUDine-zidovudine  1 tablet Oral BID  . ritonavir  100 mg Oral BID WC  . tenofovir  300 mg Oral Daily  . Warfarin - Pharmacist Dosing Inpatient   Does not apply q1800    Continuous Infusions: . sodium chloride 125 mL/hr at 08/24/13 0050    Past Medical History  Diagnosis Date  . HIV (human immunodeficiency virus infection)   . Hypertension   . Stroke   . Pancreatitis   . Mechanical heart valve present   . Arthritis   . Anemia   . GERD (gastroesophageal reflux disease)   . Abdominal pain   . Weight loss, unintentional   . Constipation   . Nausea & vomiting   . Diarrhea     Past Surgical History  Procedure Laterality Date  . Cardiac surgery    . Knee surgery    . Cholecystectomy  02/12/2012    Procedure: LAPAROSCOPIC CHOLECYSTECTOMY;  Surgeon: Stark Klein, MD;  Location: [facility];  Service: General;  Laterality: N/A;  . Esophagogastroduodenoscopy N/A 07/24/2012    Procedure: ESOPHAGOGASTRODUODENOSCOPY (EGD);  Surgeon: Beryle Beams, MD;  Location: [facility] ENDOSCOPY;  Service: Endoscopy;  Laterality: N/A;  . Aortic valve replacement      Atlee Abide Hockley LDN Clinical Dietitian [phone]

## 2013-08-24 NOTE — Progress Notes (Signed)
Patient states he wants fruit to eat and kitchen told him he could only have clear liquids, spoke with Janett Billow PA and she verified the order.  Informed patient who remains upset he is on clear liquids

## 2013-08-24 NOTE — Care Management Note (Signed)
    Page 1 of 1   08/24/2013     4:16:57 PM CARE MANAGEMENT NOTE 08/24/2013  Patient:  Brandon [name], Brandon [name]   Account Number:  [id number]  Date Initiated:  08/24/2013  Documentation initiated by:  [facility]  Subjective/Objective Assessment:   48 year old male admitted with nausea, vomiting and abdominal pain.     Action/Plan:   From hme.   Anticipated DC Date:  08/27/2013   Anticipated DC Plan:  [place]  CM consult      Choice offered to / List presented to:             Status of service:  In process, will continue to follow Medicare Important Message given?   (If response is "NO", the following Medicare IM given date fields will be blank) Date Medicare IM given:   Date Additional Medicare IM given:    Discharge Disposition:    Per UR Regulation:  Reviewed for med. necessity/level of care/duration of stay  If discussed at [place] of Stay Meetings, dates discussed:    Comments:

## 2013-08-25 LAB — GI PATHOGEN PANEL BY PCR, STOOL
C difficile toxin A/B: POSITIVE
CAMPYLOBACTER BY PCR: NEGATIVE
CRYPTOSPORIDIUM BY PCR: NEGATIVE
E COLI 0157 BY PCR: NEGATIVE
E coli (ETEC) LT/ST: NEGATIVE
E coli (STEC): NEGATIVE
G LAMBLIA BY PCR: NEGATIVE
Norovirus GI/GII: NEGATIVE
Rotavirus A by PCR: NEGATIVE
Salmonella by PCR: NEGATIVE
Shigella by PCR: NEGATIVE

## 2013-08-25 LAB — CBC
HCT: 35.6 % — ABNORMAL LOW (ref 39.0–52.0)
HEMOGLOBIN: 12.3 g/dL — AB (ref 13.0–17.0)
MCH: 37.7 pg — ABNORMAL HIGH (ref 26.0–34.0)
MCHC: 34.6 g/dL (ref 30.0–36.0)
MCV: 109.2 fL — ABNORMAL HIGH (ref 78.0–100.0)
PLATELETS: 113 10*3/uL — AB (ref 150–400)
RBC: 3.26 MIL/uL — AB (ref 4.22–5.81)
RDW: 13.1 % (ref 11.5–15.5)
WBC: 10.2 10*3/uL (ref 4.0–10.5)

## 2013-08-25 LAB — URINE CULTURE
Colony Count: NO GROWTH
Culture: NO GROWTH

## 2013-08-25 LAB — BASIC METABOLIC PANEL
BUN: 13 mg/dL (ref 6–23)
CO2: 24 mEq/L (ref 19–32)
Calcium: 8.2 mg/dL — ABNORMAL LOW (ref 8.4–10.5)
Chloride: 102 mEq/L (ref 96–112)
Creatinine, Ser: 1.68 mg/dL — ABNORMAL HIGH (ref 0.50–1.35)
GFR, EST AFRICAN AMERICAN: 55 mL/min — AB (ref >90)
GFR, EST NON AFRICAN AMERICAN: 47 mL/min — AB (ref >90)
Glucose, Bld: 94 mg/dL (ref 70–99)
POTASSIUM: 3.3 meq/L — AB (ref 3.7–5.3)
SODIUM: 136 meq/L — AB (ref 137–147)

## 2013-08-25 LAB — PROTIME-INR
INR: 3.96 — AB (ref 0.00–1.49)
Prothrombin Time: 37.2 seconds — ABNORMAL HIGH (ref 11.6–15.2)

## 2013-08-25 LAB — FECAL LACTOFERRIN, QUANT: Fecal Lactoferrin: NEGATIVE

## 2013-08-25 MED ORDER — ZOLPIDEM TARTRATE 10 MG PO TABS
10.0000 mg | ORAL_TABLET | Freq: Once | ORAL | Status: AC
  Administered 2013-08-25: 10 mg via ORAL

## 2013-08-25 MED ORDER — GLYCOPYRROLATE 1 MG PO TABS: 2.0000 mg | ORAL_TABLET | Freq: Two times a day (BID) | ORAL | Status: DC

## 2013-08-25 NOTE — Progress Notes (Signed)
Pt stated he would set himself up on [email or web address] at home.

## 2013-08-25 NOTE — Discharge Summary (Signed)
Physician Discharge Summary  Brandon [name] LTJ:[id number] DOB: [birth year] DOA: 08/23/2013  PCP: Brandon Evener, MD  Admit date: 08/23/2013 Discharge date: 08/25/2013  Time spent: 45 minutes  Recommendations for Outpatient Follow-up:  -Will be discharged home today. -Advised to follow up with Dr. Tommy [name] in 2 weeks as scheduled.   Discharge Diagnoses:  Active Problems:   Nausea & vomiting   Abdominal pain   S/P AVR (aortic valve replacement)   AIDS   Chronic pancreatitis   Adjustment disorder with mixed anxiety and depressed mood   HTN (hypertension)   Intractable nausea and vomiting   Diarrhea   HIV positive   Protein-calorie malnutrition, severe   Discharge Condition: Stable and improved  Filed Weights   08/23/13 1305 08/24/13 0500 08/25/13 0500  Weight: 46.312 kg (102 lb 1.6 oz) 50.032 kg (110 lb 4.8 oz) 53.479 kg (117 lb 14.4 oz)    History of present illness:  Patient is a 48 y/o man with multiple drug resistant HIV, presents with chronic N/V for further evaluation.  Hospital Course:   Chronic N/V -Etiology remains unclear. -?role of cyclic vomiting syndrome from cannabis use. -Has been seen by GI and note there are no plans for EGD/colonoscopy this admission. -Has been started on glycopyrrolate. -Interestingly has had no recurrence of n/v while in the hospital. -Continue scheduled and PRN antiemetics as prescribed by Dr. Tommy [name].  HIV -Continue HAART as per ID recs.  H/o AVR -Continue coumadin. -INR therapeutic.  Procedures:  None   Consultations:  GI  ID  Discharge Instructions  Discharge Instructions   Discontinue IV    Complete by:  As directed      Increase activity slowly    Complete by:  As directed             Medication List         dapsone 100 MG tablet  Take 100 mg by mouth daily.     darunavir 600 MG tablet  Commonly known as:  PREZISTA  Take 600 mg by mouth 2 (two) times daily with a meal.     dronabinol 5  MG capsule  Commonly known as:  MARINOL  Take 1 capsule (5 mg total) by mouth 2 (two) times daily before a meal.     ENSURE  Take 237 mLs by mouth 2 (two) times daily between meals. Provide a case per month: BMI<20     EPINEPHrine 0.3 mg/0.3 mL Soaj injection  Commonly known as:  EPI-PEN  Inject 0.3 mLs (0.3 mg total) into the muscle once.     escitalopram 20 MG tablet  Commonly known as:  LEXAPRO  Take 1 tablet (20 mg total) by mouth daily.     flunisolide 25 MCG/ACT (0.025%) Soln  Commonly known as:  NASALIDE  Place 2 sprays into the nose at bedtime as needed.     glycopyrrolate 1 MG tablet  Commonly known as:  ROBINUL  Take 2 tablets (2 mg total) by mouth 2 (two) times daily.     lamiVUDine-zidovudine 150-300 MG per tablet  Commonly known as:  COMBIVIR  Take 1 tablet by mouth 2 (two) times daily.     LORazepam 1 MG tablet  Commonly known as:  ATIVAN  Take 1 mg by mouth every 8 (eight) hours as needed for anxiety.     megestrol 40 MG/ML suspension  Commonly known as:  MEGACE  Take 800 mg by mouth daily.     morphine 15  MG tablet  Commonly known as:  MSIR  Take 1 tablet (15 mg total) by mouth 3 (three) times daily as needed.     morphine 15 MG 12 hr tablet  Commonly known as:  MS CONTIN  Take 1 tablet (15 mg total) by mouth 2 (two) times daily.     ondansetron 8 MG disintegrating tablet  Commonly known as:  ZOFRAN-ODT  Take 8 mg by mouth every 8 (eight) hours as needed for nausea or vomiting.     pantoprazole 40 MG tablet  Commonly known as:  PROTONIX  Take 40 mg by mouth daily.     prochlorperazine 10 MG tablet  Commonly known as:  COMPAZINE  Take 1 tablet (10 mg total) by mouth every 6 (six) hours as needed for nausea or vomiting.     ritonavir 100 MG Tabs tablet  Commonly known as:  NORVIR  Take 1 tablet (100 mg total) by mouth 2 (two) times daily with a meal.     tenofovir 300 MG tablet  Commonly known as:  VIREAD  Take 300 mg by mouth daily.      TIVICAY 50 MG tablet  Generic drug:  dolutegravir  Take 50 mg by mouth 2 (two) times daily.     valACYclovir 1000 MG tablet  Commonly known as:  VALTREX  Take 1 tablet (1,000 mg total) by mouth daily.     warfarin 5 MG tablet  Commonly known as:  COUMADIN  Take 5-6 mg by mouth See admin instructions. Takes 6mg  on Monday, Wednesday, Friday and Takes 5mg  on Tuesday, Thursday, Saturday, and Sunday     zolpidem 10 MG tablet  Commonly known as:  AMBIEN  Take 1 tablet (10 mg total) by mouth at bedtime as needed for sleep. Take 1 tablet (10 mg total) by mouth nightly as needed for Sleep. May repeat times one as needed       Allergies  Allergen Reactions  . Bactrim [Sulfamethoxazole-Trimethoprim]   . Bee Venom Anaphylaxis  . Sulfa Antibiotics Anaphylaxis  . Truvada [Emtricitabine-Tenofovir] Anaphylaxis    Takes plain tenofovir at home  . Lidoderm [Lidocaine] Other (See Comments)    Reaction unknown  . Penicillins Other (See Comments)    Had reaction as a child, doesn't remember reaction  . Raltegravir     resistance  . Sulfamethoxazole Itching  . Ceftriaxone Rash       Follow-up Information   Follow up with Brandon Evener, MD. Schedule an appointment as soon as possible for a visit in 2 weeks.   Specialty:  Infectious Diseases   Contact information:   [address] [place] [address] [phone]        The results of significant diagnostics from this hospitalization (including imaging, microbiology, ancillary and laboratory) are listed below for reference.    Significant Diagnostic Studies: Dg Chest 2 View  08/24/2013   CLINICAL DATA:  HIV positive, smoker, history hypertension, stroke, AVR  EXAM: CHEST  2 VIEW  COMPARISON:  08/23/2013  FINDINGS: Normal heart size post median sternotomy and AVR.  Mediastinal contours and pulmonary vascularity normal.  Lungs hyperinflated but clear.  No pleural effusion or pneumothorax.  No acute osseous  findings.  IMPRESSION: Post AVR.  Hyperinflated lungs without acute infiltrate.   Electronically Signed   By: Brandon [name] M.D.   On: 08/24/2013 10:33   US Abdomen Complete  08/23/2013   CLINICAL DATA:  48 year old male with abdominal pain, nausea, vomiting  and diarrhea. HIV positive. History of cholecystectomy.  EXAM: ULTRASOUND ABDOMEN COMPLETE  COMPARISON:  07/23/2012 CT and 02/09/2012 ultrasound  FINDINGS: Gallbladder:  Not visualized compatible with cholecystectomy.  Common bile duct:  Diameter: 3.9 mm. There is no evidence of intrahepatic or extrahepatic biliary dilatation. The visualized CBD is unremarkable.  Liver:  A 1 cm hyperechoic mass in the lateral left liver is unchanged from 2013 likely representing a hemangioma. Within normal limits in parenchymal echogenicity.  IVC:  No abnormality visualized.  Pancreas:  Visualized portion unremarkable.  Spleen:  Size and appearance within normal limits.  Right Kidney:  Length: 10.9 cm. Echogenicity within normal limits. No suspicious mass or hydronephrosis visualized.  Left Kidney:  Length: 10.4 cm. Echogenicity within normal limits. No mass or hydronephrosis visualized.  Abdominal aorta:  No aneurysm visualized.  Other findings:  None.  IMPRESSION: No evidence of acute abnormality.  Status post cholecystectomy.   Electronically Signed   By: Hassan Rowan M.D.   On: 08/23/2013 15:40   Acute Abdominal Series  08/23/2013   CLINICAL DATA:  Nausea and vomiting ; HIV positive  EXAM: ACUTE ABDOMEN SERIES (ABDOMEN 2 VIEW & CHEST 1 VIEW)  COMPARISON:  Chest radiograph February 09, 2012; CT abdomen and pelvis Jul 23, 2012  FINDINGS: PA chest: No edema or consolidation. Lungs are mildly hyperexpanded. The heart size and pulmonary vascularity are normal. Patient is status post aortic valve replacement.  Supine and upright abdomen: The bowel gas pattern is unremarkable. No obstruction or free air. No abnormal calcifications. There are surgical clips in the gallbladder fossa  region.  IMPRESSION: Bowel gas pattern unremarkable. Lungs mildly hyperexpanded but clear.   Electronically Signed   By: Lowella Grip M.D.   On: 08/23/2013 15:48    Microbiology: Recent Results (from the past 240 hour(s))  CULTURE, BLOOD (ROUTINE X 2)     Status: None   Collection Time    08/23/13  2:35 PM      Result Value Ref Range Status   Specimen Description BLOOD LEFT HAND   Final   Special Requests BOTTLES DRAWN AEROBIC AND ANAEROBIC 10CC   Final   Culture  Setup Time     Final   Value: 08/23/2013 19:37     Performed at [facility]   Culture     Final   Value:        BLOOD CULTURE RECEIVED NO GROWTH TO DATE CULTURE WILL BE HELD FOR 5 DAYS BEFORE ISSUING A FINAL NEGATIVE REPORT     Performed at [facility]   Report Status PENDING   Incomplete  CULTURE, BLOOD (ROUTINE X 2)     Status: None   Collection Time    08/23/13  2:50 PM      Result Value Ref Range Status   Specimen Description BLOOD LEFT ARM   Final   Special Requests BOTTLES DRAWN AEROBIC AND ANAEROBIC 10CC   Final   Culture  Setup Time     Final   Value: 08/23/2013 19:37     Performed at [facility]   Culture     Final   Value:        BLOOD CULTURE RECEIVED NO GROWTH TO DATE CULTURE WILL BE HELD FOR 5 DAYS BEFORE ISSUING A FINAL NEGATIVE REPORT     Performed at [facility]   Report Status PENDING   Incomplete  URINE CULTURE     Status: None   Collection Time    08/23/13  7:07 PM      Result Value Ref Range Status   Specimen Description URINE, CLEAN CATCH   Final   Special Requests NONE   Final   Culture  Setup Time     Final   Value: 08/24/2013 00:22     Performed at [facility]     Final   Value: NO GROWTH     Performed at [facility]   Culture     Final   Value: NO GROWTH     Performed at [facility]   Report Status 08/25/2013 FINAL   Final     Labs: Basic Metabolic Panel:  Recent Labs Lab 08/23/13 1126  08/23/13 1435 08/25/13 0458  NA 142 141 136*  K 3.8 3.4* 3.3*  CL 101 102 102  CO2 30 28 24   GLUCOSE 98 86 94  BUN 14 15 13   CREATININE 1.61* 1.51* 1.68*  CALCIUM 9.6 9.2 8.2*  MG  --  1.7  --    Liver Function Tests:  Recent Labs Lab 08/23/13 1126 08/23/13 1435  AST 19 19  ALT 16 17  ALKPHOS 92 83  BILITOT 0.7 0.6  PROT 7.2 6.7  ALBUMIN 4.1 3.8    Recent Labs Lab 08/23/13 1435  LIPASE 18  AMYLASE 93   No results found for this basename: AMMONIA,  in the last 168 hours CBC:  Recent Labs Lab 08/23/13 1126 08/23/13 1435 08/25/13 0458  WBC 3.4* 3.9* 10.2  NEUTROABS 1.5* 1.5*  --   HGB 14.1 13.1 12.3*  HCT 39.7 37.0* 35.6*  MCV 108.8* 108.8* 109.2*  PLT 173 149* 113*   Cardiac Enzymes: No results found for this basename: CKTOTAL, CKMB, CKMBINDEX, TROPONINI,  in the last 168 hours BNP: BNP (last 3 results) No results found for this basename: PROBNP,  in the last 8760 hours CBG: No results found for this basename: GLUCAP,  in the last 168 hours     Signed:  Lelon Frohlich  [facility] Pager: [phone] 08/25/2013, 1:54 PM

## 2013-08-25 NOTE — Clinical Documentation Improvement (Signed)
  Eval 6/16 by Registered Dietician states: meets criteria for Severe protein calorie malnutrition in the context of chronic illness as evidenced by PO intake <75% for > one month, 8% body weight loss in 3 months. Current BMI 16.34.  Possible Clinical Conditions? - Severe Protein Calorie Malnutrition - Cachexia   - Other Condition  Thank You, Ezekiel Ina ,RN Clinical Documentation Specialist:  [phone]  [facility]

## 2013-08-25 NOTE — Progress Notes (Signed)
[facility] Progress Note  Subjective:  Won't talk much today.  Had two episodes of diarrhea.    Objective:  Vital signs in last 24 hours: Temp:  [98.7 F (37.1 C)-100.7 F (38.2 C)] 98.7 F (37.1 C) (06/17 0800) Pulse Rate:  [64-105] 64 (06/17 0800) Resp:  [16-18] 16 (06/17 0800) BP: (98-102)/(61-62) 102/62 mmHg (06/17 0800) SpO2:  [92 %-95 %] 95 % (06/17 0800) Weight:  [117 lb 14.4 oz (53.479 kg)] 117 lb 14.4 oz (53.479 kg) (06/17 0500) Last BM Date: 08/25/13 General:  Alert, thin/cachectic, in NAD Heart:  Regular rate and rhythm; no murmurs Pulm:  CTAB.  No W/R/R. Abdomen:  Soft, non-distended. Normal bowel sounds.  Mild upper abdominal TTP without R/R/G.   Extremities:  Without edema. Neurologic:  Alert and oriented x4;  grossly normal neurologically.  Intake/Output from previous day: 06/16 0701 - 06/17 0700 In: 2829.2 [I.V.:2629.2; IV Piggyback:200] Out: -  Intake/Output this shift: Total I/O In: -  Out: 300 [Urine:300]  Lab Results:  Recent Labs  08/23/13 1126 08/23/13 1435 08/25/13 0458  WBC 3.4* 3.9* 10.2  HGB 14.1 13.1 12.3*  HCT 39.7 37.0* 35.6*  PLT 173 149* 113*   BMET  Recent Labs  08/23/13 1126 08/23/13 1435 08/25/13 0458  NA 142 141 136*  K 3.8 3.4* 3.3*  CL 101 102 102  CO2 30 28 24   GLUCOSE 98 86 94  BUN 14 15 13   CREATININE 1.61* 1.51* 1.68*  CALCIUM 9.6 9.2 8.2*   LFT  Recent Labs  08/23/13 1435  PROT 6.7  ALBUMIN 3.8  AST 19  ALT 17  ALKPHOS 83  BILITOT 0.6   PT/INR  Recent Labs  08/24/13 1811 08/25/13 0458  LABPROT 32.3* 37.2*  INR 3.29* 3.96*   Dg Chest 2 View  08/24/2013   CLINICAL DATA:  HIV positive, smoker, history hypertension, stroke, AVR  EXAM: CHEST  2 VIEW  COMPARISON:  08/23/2013  FINDINGS: Normal heart size post median sternotomy and AVR.  Mediastinal contours and pulmonary vascularity normal.  Lungs hyperinflated but clear.  No pleural effusion or pneumothorax.  No acute osseous  findings.  IMPRESSION: Post AVR.  Hyperinflated lungs without acute infiltrate.   Electronically Signed   By: Lavonia Dana M.D.   On: 08/24/2013 10:33   US Abdomen Complete  08/23/2013   CLINICAL DATA:  48 year old male with abdominal pain, nausea, vomiting and diarrhea. HIV positive. History of cholecystectomy.  EXAM: ULTRASOUND ABDOMEN COMPLETE  COMPARISON:  07/23/2012 CT and 02/09/2012 ultrasound  FINDINGS: Gallbladder:  Not visualized compatible with cholecystectomy.  Common bile duct:  Diameter: 3.9 mm. There is no evidence of intrahepatic or extrahepatic biliary dilatation. The visualized CBD is unremarkable.  Liver:  A 1 cm hyperechoic mass in the lateral left liver is unchanged from 2013 likely representing a hemangioma. Within normal limits in parenchymal echogenicity.  IVC:  No abnormality visualized.  Pancreas:  Visualized portion unremarkable.  Spleen:  Size and appearance within normal limits.  Right Kidney:  Length: 10.9 cm. Echogenicity within normal limits. No suspicious mass or hydronephrosis visualized.  Left Kidney:  Length: 10.4 cm. Echogenicity within normal limits. No mass or hydronephrosis visualized.  Abdominal aorta:  No aneurysm visualized.  Other findings:  None.  IMPRESSION: No evidence of acute abnormality.  Status post cholecystectomy.   Electronically Signed   By: Hassan Rowan M.D.   On: 08/23/2013 15:40   Acute Abdominal Series  08/23/2013   CLINICAL DATA:  Nausea and  vomiting ; HIV positive  EXAM: ACUTE ABDOMEN SERIES (ABDOMEN 2 VIEW & CHEST 1 VIEW)  COMPARISON:  Chest radiograph February 09, 2012; CT abdomen and pelvis Jul 23, 2012  FINDINGS: PA chest: No edema or consolidation. Lungs are mildly hyperexpanded. The heart size and pulmonary vascularity are normal. Patient is status post aortic valve replacement.  Supine and upright abdomen: The bowel gas pattern is unremarkable. No obstruction or free air. No abnormal calcifications. There are surgical clips in the gallbladder fossa  region.  IMPRESSION: Bowel gas pattern unremarkable. Lungs mildly hyperexpanded but clear.   Electronically Signed   By: Lowella Grip M.D.   On: 08/23/2013 15:48    Assessment / Plan: -Recurrent intermittent nausea and vomiting, chronic for 15 years: No improvement post cholecystectomy. ? Related to medications (is on several narcotics at home). Also smokes marijuana daily (? If it could be related). ? Enhanced vagal tone.  Likely has some IBS as well. -Alternating constipation with diarrhea: Stool for occult blood and lactoferrin negative.  GI pathogen panel is pending. -HIV: Since 1995. Multi-drug resistant.  -AVR replacement in 1997 requiring coumadin anticoagulation  -No plans for endoscopic evaluation. -Anti-emetics prn.  -Await stool pathogen panel. -Continue Robinul 2 mg BID and follow-up as outpatient with Dr. Carlean Purl as needed.    LOS: 2 days   ZEHR, JESSICA D.  08/25/2013, 11:13 AM  Pager number [phone]   Agree with plans as above. Gatha Mayer, MD, [facility]

## 2013-08-27 ENCOUNTER — Other Ambulatory Visit: Payer: Self-pay | Admitting: Licensed Clinical Social Worker

## 2013-08-27 MED ORDER — METRONIDAZOLE 500 MG PO TABS: 500.0000 mg | ORAL_TABLET | Freq: Three times a day (TID) | ORAL | Status: DC

## 2013-08-27 NOTE — Progress Notes (Signed)
Patient aware and medication called in.

## 2013-08-29 LAB — CULTURE, BLOOD (ROUTINE X 2)
Culture: NO GROWTH
Culture: NO GROWTH

## 2013-08-30 ENCOUNTER — Other Ambulatory Visit: Payer: Self-pay | Admitting: Infectious Disease

## 2013-09-13 ENCOUNTER — Other Ambulatory Visit: Payer: Self-pay | Admitting: *Deleted

## 2013-09-13 DIAGNOSIS — B20 Human immunodeficiency virus [HIV] disease: Secondary | ICD-10-CM

## 2013-09-13 MED ORDER — MORPHINE SULFATE 15 MG PO TABS: 15.0000 mg | ORAL_TABLET | Freq: Three times a day (TID) | ORAL | Status: DC | PRN

## 2013-09-13 MED ORDER — MORPHINE SULFATE ER 15 MG PO TBCR: 15.0000 mg | EXTENDED_RELEASE_TABLET | Freq: Two times a day (BID) | ORAL | Status: DC

## 2013-09-24 ENCOUNTER — Emergency Department (HOSPITAL_COMMUNITY)
Admission: EM | Admit: 2013-09-24 | Discharge: 2013-09-24 | Disposition: A | Discharge status: Home / Self Care | Payer: Medicaid Other | Attending: Emergency Medicine | Admitting: Emergency Medicine

## 2013-09-24 ENCOUNTER — Encounter (HOSPITAL_COMMUNITY): Payer: Self-pay | Admitting: Emergency Medicine

## 2013-09-24 DIAGNOSIS — Z862 Personal history of diseases of the blood and blood-forming organs and certain disorders involving the immune mechanism: Secondary | ICD-10-CM | POA: Insufficient documentation

## 2013-09-24 DIAGNOSIS — Z880 Allergy status to penicillin: Secondary | ICD-10-CM | POA: Insufficient documentation

## 2013-09-24 DIAGNOSIS — Z21 Asymptomatic human immunodeficiency virus [HIV] infection status: Secondary | ICD-10-CM | POA: Insufficient documentation

## 2013-09-24 DIAGNOSIS — R1110 Vomiting, unspecified: Secondary | ICD-10-CM | POA: Insufficient documentation

## 2013-09-24 DIAGNOSIS — R1084 Generalized abdominal pain: Secondary | ICD-10-CM | POA: Diagnosis not present

## 2013-09-24 DIAGNOSIS — Z8739 Personal history of other diseases of the musculoskeletal system and connective tissue: Secondary | ICD-10-CM | POA: Diagnosis not present

## 2013-09-24 DIAGNOSIS — Z79899 Other long term (current) drug therapy: Secondary | ICD-10-CM | POA: Diagnosis not present

## 2013-09-24 DIAGNOSIS — Z8673 Personal history of transient ischemic attack (TIA), and cerebral infarction without residual deficits: Secondary | ICD-10-CM | POA: Insufficient documentation

## 2013-09-24 DIAGNOSIS — IMO0002 Reserved for concepts with insufficient information to code with codable children: Secondary | ICD-10-CM | POA: Insufficient documentation

## 2013-09-24 DIAGNOSIS — I10 Essential (primary) hypertension: Secondary | ICD-10-CM | POA: Diagnosis not present

## 2013-09-24 DIAGNOSIS — K219 Gastro-esophageal reflux disease without esophagitis: Secondary | ICD-10-CM | POA: Diagnosis not present

## 2013-09-24 DIAGNOSIS — F17200 Nicotine dependence, unspecified, uncomplicated: Secondary | ICD-10-CM | POA: Diagnosis not present

## 2013-09-24 DIAGNOSIS — R109 Unspecified abdominal pain: Secondary | ICD-10-CM

## 2013-09-24 DIAGNOSIS — Z9089 Acquired absence of other organs: Secondary | ICD-10-CM | POA: Insufficient documentation

## 2013-09-24 LAB — COMPREHENSIVE METABOLIC PANEL
ALT: 19 U/L (ref 0–53)
AST: 24 U/L (ref 0–37)
Albumin: 5 g/dL (ref 3.5–5.2)
Alkaline Phosphatase: 96 U/L (ref 39–117)
Anion gap: 17 — ABNORMAL HIGH (ref 5–15)
BUN: 15 mg/dL (ref 6–23)
CO2: 26 mEq/L (ref 19–32)
Calcium: 10.8 mg/dL — ABNORMAL HIGH (ref 8.4–10.5)
Chloride: 95 mEq/L — ABNORMAL LOW (ref 96–112)
Creatinine, Ser: 1.17 mg/dL (ref 0.50–1.35)
GFR calc Af Amer: 85 mL/min — ABNORMAL LOW (ref >90)
GFR calc non Af Amer: 73 mL/min — ABNORMAL LOW (ref >90)
Glucose, Bld: 121 mg/dL — ABNORMAL HIGH (ref 70–99)
Potassium: 4.8 mEq/L (ref 3.7–5.3)
Sodium: 138 mEq/L (ref 137–147)
Total Bilirubin: 1 mg/dL (ref 0.3–1.2)
Total Protein: 9.5 g/dL — ABNORMAL HIGH (ref 6.0–8.3)

## 2013-09-24 LAB — CBC WITH DIFFERENTIAL/PLATELET
Basophils Absolute: 0 10*3/uL (ref 0.0–0.1)
Basophils Relative: 0 % (ref 0–1)
Eosinophils Absolute: 0 10*3/uL (ref 0.0–0.7)
Eosinophils Relative: 0 % (ref 0–5)
HCT: 44.2 % (ref 39.0–52.0)
Hemoglobin: 15.8 g/dL (ref 13.0–17.0)
Lymphocytes Relative: 17 % (ref 12–46)
Lymphs Abs: 1.3 10*3/uL (ref 0.7–4.0)
MCH: 37.9 pg — ABNORMAL HIGH (ref 26.0–34.0)
MCHC: 35.7 g/dL (ref 30.0–36.0)
MCV: 106 fL — ABNORMAL HIGH (ref 78.0–100.0)
Monocytes Absolute: 0.4 10*3/uL (ref 0.1–1.0)
Monocytes Relative: 6 % (ref 3–12)
Neutro Abs: 5.9 10*3/uL (ref 1.7–7.7)
Neutrophils Relative %: 77 % (ref 43–77)
Platelets: 188 10*3/uL (ref 150–400)
RBC: 4.17 MIL/uL — ABNORMAL LOW (ref 4.22–5.81)
RDW: 13.3 % (ref 11.5–15.5)
WBC: 7.7 10*3/uL (ref 4.0–10.5)

## 2013-09-24 LAB — LIPASE, BLOOD: Lipase: 19 U/L (ref 11–59)

## 2013-09-24 MED ORDER — ONDANSETRON HCL 4 MG/2ML IJ SOLN
4.0000 mg | Freq: Once | INTRAMUSCULAR | Status: AC
  Administered 2013-09-24: 4 mg via INTRAVENOUS
  Filled 2013-09-24: qty 2

## 2013-09-24 MED ORDER — HYDROMORPHONE HCL PF 1 MG/ML IJ SOLN
1.0000 mg | Freq: Once | INTRAMUSCULAR | Status: AC
  Administered 2013-09-24: 1 mg via INTRAVENOUS
  Filled 2013-09-24: qty 1

## 2013-09-24 MED ORDER — HYDROMORPHONE HCL PF 1 MG/ML IJ SOLN
0.5000 mg | Freq: Once | INTRAMUSCULAR | Status: AC
  Administered 2013-09-24: 0.5 mg via INTRAVENOUS
  Filled 2013-09-24: qty 1

## 2013-09-24 MED ORDER — SODIUM CHLORIDE 0.9 % IV BOLUS (SEPSIS)
1000.0000 mL | Freq: Once | INTRAVENOUS | Status: AC
  Administered 2013-09-24: 1000 mL via INTRAVENOUS

## 2013-09-24 NOTE — ED Notes (Signed)
Pt states "I have a pancreatitis flare up." Pt c/o n/v/d since Wed and has gotten worse. Pt also c/o diffuse abdominal pain.

## 2013-09-24 NOTE — Discharge Instructions (Signed)

## 2013-09-30 NOTE — ED Provider Notes (Signed)
CSN: [id number]     Arrival date & time 09/24/13  1541 History   First MD Initiated Contact with Patient 09/24/13 1611     Chief Complaint  Patient presents with  . Abdominal Pain  . Emesis     (Consider location/radiation/quality/duration/timing/severity/associated sxs/prior Treatment) HPI  48 old male with abdominal pain. Patient has a past history of what he calls pancreatitis. He states that his current symptoms are pretty similar to prior exacerbations. Most recently, symptoms have been worsening over the past week. Pain is diffuse, but worse in the upper abdomen. Does not lateralize. Constant. Progressively worsening. No appreciable exacerbating or relieving factors. Patient does drink occasional alcohol in moderation. He is status post cholecystectomy about a year and a half ago. No urinary complaints. No diarrhea. No fevers or chills. Has been taking Phenergan at home which does provide some relief. Reports compliance with his medications.  Past Medical History  Diagnosis Date  . HIV (human immunodeficiency virus infection)   . Hypertension   . Stroke   . Pancreatitis   . Mechanical heart valve present   . Arthritis   . Anemia   . GERD (gastroesophageal reflux disease)   . Abdominal pain   . Weight loss, unintentional   . Constipation   . Nausea & vomiting   . Diarrhea    Past Surgical History  Procedure Laterality Date  . Cardiac surgery    . Knee surgery    . Cholecystectomy  02/12/2012    Procedure: LAPAROSCOPIC CHOLECYSTECTOMY;  Surgeon: Stark Klein, MD;  Location: [place];  Service: General;  Laterality: N/A;  . Esophagogastroduodenoscopy N/A 07/24/2012    Procedure: ESOPHAGOGASTRODUODENOSCOPY (EGD);  Surgeon: Beryle Beams, MD;  Location[facility] ENDOSCOPY;  Service: Endoscopy;  Laterality: N/A;  . Aortic valve replacement     Family History  Problem Relation Age of Onset  . Hypertension Father   . Cancer - Prostate Father   . Cancer Father     stomach  .  Hypertension Sister   . Diabetes Maternal Aunt   . Cancer - Other Cousin   . Parkinson's disease Paternal Aunt    History  Substance Use Topics  . Smoking status: Current Every Day Smoker -- 0.50 packs/day for 20 years  . Smokeless tobacco: Never Used  . Alcohol Use: Yes     Comment: 12oz beer/ per week     Review of Systems  All systems reviewed and negative, other than as noted in HPI.   Allergies  Bactrim; Bee venom; Sulfa antibiotics; Truvada; Lidoderm; Penicillins; Raltegravir; Sulfamethoxazole; and Ceftriaxone  Home Medications   Prior to Admission medications   Medication Sig Start Date End Date Taking? Authorizing Provider  dapsone 100 MG tablet Take 100 mg by mouth daily.   Yes Historical Provider, MD  darunavir (PREZISTA) 600 MG tablet Take 600 mg by mouth 2 (two) times daily with a meal.   Yes Historical Provider, MD  dolutegravir (TIVICAY) 50 MG tablet Take 50 mg by mouth 2 (two) times daily.   Yes Historical Provider, MD  dronabinol (MARINOL) 5 MG capsule Take 1 capsule (5 mg total) by mouth 2 (two) times daily before a meal. 07/19/13  Yes Truman Hayward, MD  ENSURE (ENSURE) Take 237 mLs by mouth 2 (two) times daily between meals. Provide a case per month: BMI<20 07/19/13  Yes Truman Hayward, MD  EPINEPHrine (EPI-PEN) 0.3 mg/0.3 mL DEVI Inject 0.3 mLs (0.3 mg total) into the muscle once. 08/20/12  Yes Truman Hayward, MD  escitalopram (LEXAPRO) 20 MG tablet Take 1 tablet (20 mg total) by mouth daily. 07/19/13  Yes Truman Hayward, MD  flunisolide (NASALIDE) 25 MCG/ACT (0.025%) SOLN Place 2 sprays into the nose at bedtime as needed. 03/17/13  Yes Truman Hayward, MD  glycopyrrolate (ROBINUL) 1 MG tablet Take 2 tablets (2 mg total) by mouth 2 (two) times daily. 08/25/13  Yes Estela Leonie Green, MD  lamiVUDine-zidovudine (COMBIVIR) 150-300 MG per tablet Take 1 tablet by mouth 2 (two) times daily.   Yes Historical Provider, MD  LORazepam (ATIVAN) 1 MG  tablet Take 1 mg by mouth every 8 (eight) hours as needed for anxiety.   Yes Historical Provider, MD  megestrol (MEGACE) 40 MG/ML suspension Take 800 mg by mouth daily.   Yes Historical Provider, MD  morphine (MS CONTIN) 15 MG 12 hr tablet Take 1 tablet (15 mg total) by mouth 2 (two) times daily. 09/13/13  Yes Thayer Headings, MD  morphine (MSIR) 15 MG tablet Take 1 tablet (15 mg total) by mouth 3 (three) times daily as needed. 09/13/13  Yes Thayer Headings, MD  ondansetron (ZOFRAN-ODT) 8 MG disintegrating tablet Take 8 mg by mouth every 8 (eight) hours as needed for nausea or vomiting.   Yes Historical Provider, MD  pantoprazole (PROTONIX) 40 MG tablet Take 40 mg by mouth daily.   Yes Historical Provider, MD  prochlorperazine (COMPAZINE) 10 MG tablet Take 1 tablet (10 mg total) by mouth every 6 (six) hours as needed for nausea or vomiting. 07/19/13  Yes Truman Hayward, MD  ritonavir (NORVIR) 100 MG TABS tablet Take 1 tablet (100 mg total) by mouth 2 (two) times daily with a meal. 07/14/13  Yes Truman Hayward, MD  tenofovir (VIREAD) 300 MG tablet Take 300 mg by mouth daily.   Yes Historical Provider, MD  valACYclovir (VALTREX) 1000 MG tablet Take 1 tablet (1,000 mg total) by mouth daily. 06/02/12  Yes Truman Hayward, MD  warfarin (COUMADIN) 5 MG tablet Take 5-6 mg by mouth See admin instructions. Takes 6mg  on Monday, Wednesday, Friday and Takes 5mg  on Tuesday, Thursday, Saturday, and Sunday   Yes Historical Provider, MD  zolpidem (AMBIEN) 10 MG tablet Take 1 tablet (10 mg total) by mouth at bedtime as needed for sleep. Take 1 tablet (10 mg total) by mouth nightly as needed for Sleep. May repeat times one as needed 05/24/13  Yes Truman Hayward, MD   BP 143/86  Pulse 70  Temp(Src) 98.7 F (37.1 C) (Oral)  Resp 14  SpO2 100% Physical Exam  Nursing note and vitals reviewed. Constitutional: He appears well-developed and well-nourished. No distress.  Laying in bed. Appears uncomfortable.   HENT:  Head: Normocephalic and atraumatic.  Eyes: Conjunctivae are normal. Right eye exhibits no discharge. Left eye exhibits no discharge.  Neck: Neck supple.  Cardiovascular: Normal rate, regular rhythm and normal heart sounds.  Exam reveals no gallop and no friction rub.   No murmur heard. Pulmonary/Chest: Effort normal and breath sounds normal. No respiratory distress.  Abdominal: Soft. He exhibits no distension. There is tenderness.   Patient is very hesitant to let me palpate anywhere on his abdomen. Once I was able to get to relax a little bit Exam. He is diffusely tender. Does not seem to particularly localize. He does have voluntary guarding. There is no rebound. Is not distended.  Musculoskeletal: He exhibits no edema and  no tenderness.  No CVA tenderness  Neurological: He is alert.  Skin: Skin is warm and dry.  Psychiatric: He has a normal mood and affect. His behavior is normal. Thought content normal.    ED Course  Procedures (including critical care time) Labs Review Labs Reviewed  COMPREHENSIVE METABOLIC PANEL - Abnormal; Notable for the following:    Chloride 95 (*)    Glucose, Bld 121 (*)    Calcium 10.8 (*)    Total Protein 9.5 (*)    GFR calc non Af Amer 73 (*)    GFR calc Af Amer 85 (*)    Anion gap 17 (*)    All other components within normal limits  CBC WITH DIFFERENTIAL - Abnormal; Notable for the following:    RBC 4.17 (*)    MCV 106.0 (*)    MCH 37.9 (*)    All other components within normal limits  LIPASE, BLOOD    Imaging Review No results found.   EKG Interpretation None      MDM   Final diagnoses:  Abdominal pain, unspecified abdominal location    48 year old male with abdominal pain. Patient has a past history of pancreatitis and says current symptoms to this. Lipase is normal. Repeat abdominal exam after medications is benign. He states that he feels significantly better and feels comfortable being discharged. He is hemodynamically  stable. Afebrile. Some mild electrolyte abnormalities, nothing overly concerning. Renal function is okay. I have a very low suspicion for an acute surgical process. In medically stable for discharge at this time. Return precautions were discussed.   Virgel Manifold, MD 09/30/13 1124

## 2013-10-07 ENCOUNTER — Encounter: Payer: Self-pay | Admitting: Infectious Disease

## 2013-10-07 ENCOUNTER — Ambulatory Visit (INDEPENDENT_AMBULATORY_CARE_PROVIDER_SITE_OTHER): Payer: Medicaid Other | Admitting: Infectious Disease

## 2013-10-07 VITALS — BP 108/73 | HR 83 | Temp 99.2°F | Wt 114.0 lb

## 2013-10-07 DIAGNOSIS — R197 Diarrhea, unspecified: Secondary | ICD-10-CM

## 2013-10-07 DIAGNOSIS — G894 Chronic pain syndrome: Secondary | ICD-10-CM

## 2013-10-07 DIAGNOSIS — G8929 Other chronic pain: Secondary | ICD-10-CM

## 2013-10-07 DIAGNOSIS — Q2730 Arteriovenous malformation, site unspecified: Secondary | ICD-10-CM

## 2013-10-07 DIAGNOSIS — R1013 Epigastric pain: Secondary | ICD-10-CM

## 2013-10-07 DIAGNOSIS — F3289 Other specified depressive episodes: Secondary | ICD-10-CM

## 2013-10-07 DIAGNOSIS — M2550 Pain in unspecified joint: Secondary | ICD-10-CM

## 2013-10-07 DIAGNOSIS — Z954 Presence of other heart-valve replacement: Secondary | ICD-10-CM

## 2013-10-07 DIAGNOSIS — R1115 Cyclical vomiting syndrome unrelated to migraine: Secondary | ICD-10-CM

## 2013-10-07 DIAGNOSIS — Q279 Congenital malformation of peripheral vascular system, unspecified: Secondary | ICD-10-CM

## 2013-10-07 DIAGNOSIS — F329 Major depressive disorder, single episode, unspecified: Secondary | ICD-10-CM

## 2013-10-07 DIAGNOSIS — B20 Human immunodeficiency virus [HIV] disease: Secondary | ICD-10-CM

## 2013-10-07 DIAGNOSIS — F32A Depression, unspecified: Secondary | ICD-10-CM

## 2013-10-07 DIAGNOSIS — Z952 Presence of prosthetic heart valve: Secondary | ICD-10-CM

## 2013-10-07 NOTE — Progress Notes (Signed)
Subjective:    Patient ID: Brandon [name], male    DOB: 1965/05/23, 48 y.o.   MRN: [id number]  HPI   Brandon [name] is a highly complicated man with HIV/AIDS and Multi-DRUG RESISTANT virus formerly followed at [facility]. Apparently he had been taken off ARV and placed with hospice.  He has been on various complicated antiretroviral regimens in the past, with unfortunate GENOTYPIC resistance to all non-nucleoside reverse transcriptase inhibitors and all NRTIs, Resistance to all protease inhibitors with the exception of Prezista which had some activity genotypically,, Resistance to Isentress, and Elvitegravir and 100X reduced S to dolutegravir having both a 148H and 140S  and with Dual tropic virus.  When he had at [facility]  on a regimen of Twice daily Prezista, Norvir, twice daily AZT, once daily Viread, with these meds stopped n 02/2012. Prior to this had been on similar regimen but with isentress and maraviroc (despite IN R and dual mixed virus. At that time Dolutegravir was not available)  02/12/2010 phenotype at [facility] showed:  RT: NRTI: ABC, DDI, D4T, AZT, TDF: resistant; 3TC, FTC: susceptible; NNRTI: EFV susceptible; RPV, NVP, ETR, DLV: Resistant; PI: pan-resistant  He had decided now to go back onto ARVS and is referred to [facility].  We  Have seen him and placed him on a  salvage regimen of Prezista 600mg   Twice daily boosted with Norvir 100mg  twice daily, Tivicay twice daily, Combivir twice daily and once daily Viread.  And since then he has now an UNDETECTABLE VIRAL LOAD <20.  And CD4 >200  when las checked in April. He has maintained perfect virological suppresion for past 12 months.  He continues to suffer from chronic nausea and vomiting with diarrhea.  We had tried to see if addition of compazine might help and it did stablize things for a bit but he then had worsening and my partner Brandon [name] arranged for hospital admission but he left within 24 hours because of anger when he was  given wrong dose of Prezista  He comes in today with worsening nausea, and vomiting. He has noticed that is the worst in the am when he goes to make BM int he bathroom. He has been able to keep his antiretrovirals down on most days with the exception of proximally one day per week.  One year ago he had an upper endoscopy performed by Brandon [name] which showed no esophageal abnormalities and a nonbleeding avascular malformation.  During last visit I had him admitted to the hospital for evaluation of his nausea and vomiting. He had one episode of loose stools that was worked up and C Difficile PCR was + and he was put on flagyl. He was seen by GI (Brandon [name]) and put on glycopyrrolate. He is also being evluated by GI at [facility]. He is on MSIR that we have continued from when he was on hospice at [facility] along with prescription for MS contin which Medicaid is no refusing to fill. These meds were to treat diffuse osteoarthritic pains and to ease his suffering, abdominal pain as he was on hospice and it is likely now I suspect that we have cotninued him on these meds that medicaid is questioning validity and need for them. I will investigate further on organic causes of pain that truly merit the narcotics we continued from hospice when he was literally on "death's door" now that he is more than 14 months out with VL <20 and reasonably healthy CD4 count.  Lab  Results  Component Value Date   HIV1RNAQUANT <20 08/23/2013     Lab Results  Component Value Date   CD4TABS 140* 08/23/2013   CD4TABS 210* 06/21/2013   CD4TABS 170* 02/19/2013    He is still having loose stools 3-4 times per day more when he eats food.  Review of Systems  Constitutional: Positive for fatigue. Negative for fever, chills, diaphoresis, activity change, appetite change and unexpected weight change.  HENT: Negative for congestion, rhinorrhea, sinus pressure, sneezing, sore throat and trouble swallowing.   Eyes: Negative for  photophobia and visual disturbance.  Respiratory: Negative for cough, chest tightness, shortness of breath, wheezing and stridor.   Cardiovascular: Negative for chest pain, palpitations and leg swelling.  Gastrointestinal: Positive for nausea, vomiting and diarrhea. Negative for constipation, abdominal distention and anal bleeding.  Genitourinary: Negative for dysuria, hematuria, flank pain and difficulty urinating.  Musculoskeletal: Negative for arthralgias, back pain, gait problem, joint swelling and myalgias.  Skin: Negative for color change, rash and wound.  Neurological: Negative for dizziness, tremors, weakness and light-headedness.  Hematological: Negative for adenopathy. Does not bruise/bleed easily.  Psychiatric/Behavioral: Negative for behavioral problems, confusion, sleep disturbance, decreased concentration and agitation.       Objective:   Physical Exam  Constitutional: He is oriented to person, place, and time. No distress.  HENT:  Head: Normocephalic and atraumatic.  Mouth/Throat: Posterior oropharyngeal edema present. No oropharyngeal exudate.  Eyes: Conjunctivae and EOM are normal. Pupils are equal, round, and reactive to light. Right eye exhibits no discharge and no exudate. Left eye exhibits no discharge and no exudate. Right conjunctiva is not injected. Right conjunctiva has no hemorrhage. Left conjunctiva is not injected. Left conjunctiva has no hemorrhage.  Neck: Normal range of motion. Neck supple.  Cardiovascular: Regular rhythm and normal heart sounds.  Exam reveals no gallop and no friction rub.   No murmur heard. Pulmonary/Chest: Effort normal and breath sounds normal. No respiratory distress. He has no wheezes. He has no rales.  Abdominal: Soft. Bowel sounds are normal. He exhibits no mass. There is tenderness. There is no rebound and no guarding.  Musculoskeletal: He exhibits no edema and no tenderness.  Lymphadenopathy:    He has no cervical adenopathy.   Neurological: He is alert and oriented to person, place, and time. He exhibits normal muscle tone. Coordination normal.  Skin: Skin is warm and dry. No erythema. No pallor.  Psychiatric: His behavior is normal. Judgment and thought content normal. His mood appears not anxious. He does not exhibit a depressed mood.          Assessment & Plan:   #1 HIV: Highly Resistant Virus.  Continue the  --Tivicay 50mg  BID --Prezista 600mg  BID with  --Norvir 100mg  BID --Combivir 1 tablet BID --Viread q daily  HE SHOULD NOT TAKE ENSURE AT THE SAME TIME AS HIS TIVICAY!!!!  OI prophylaxis: continue dapsone for now but if CD4 still above 200,  will discontinue it  Nausea and vomiting: hx of AVM on anticoaguation. GI saw him and saw no need for repeat endoscopic eval. They started glycopyrrolate and advised against using marijuana in case he was having cyclic vomiting related to [facility]  Depression: Continue SSRI. Note he has multiple stressors related to both his own health his wife. His wife had non-Hodgkin lymphoma that was successfully treated and she was here today in clinic with him  Pain:  I have been continuing meds started at [facility] bu as discussed Medicaid does not find Ms Contin rx  to have legitimate reason for rx. Would be preferable if we can have more clear organic  reasons why he needs these meds or wean him off of them   AVR: mechanical on coumadin followed at [facility].

## 2013-10-18 ENCOUNTER — Other Ambulatory Visit: Payer: Medicaid Other

## 2013-11-02 ENCOUNTER — Telehealth: Payer: Self-pay | Admitting: *Deleted

## 2013-11-02 NOTE — Telephone Encounter (Signed)
Patient called c/o nausea and vomiting x 2 days. Asked if he could come into the clinic to get fluids and explained he would need to be evaluated by an MD. Offered appointment with Dr. Megan Salon for tomorrow at 2:30 and he said he needed to check with his wife first who just recently had surgery.  Myrtis Hopping

## 2013-11-04 ENCOUNTER — Ambulatory Visit (INDEPENDENT_AMBULATORY_CARE_PROVIDER_SITE_OTHER): Payer: Medicaid Other | Admitting: Internal Medicine

## 2013-11-04 ENCOUNTER — Encounter: Payer: Self-pay | Admitting: Internal Medicine

## 2013-11-04 VITALS — BP 113/86 | HR 124 | Temp 99.1°F | Wt 109.0 lb

## 2013-11-04 DIAGNOSIS — R1110 Vomiting, unspecified: Secondary | ICD-10-CM

## 2013-11-04 DIAGNOSIS — R112 Nausea with vomiting, unspecified: Secondary | ICD-10-CM

## 2013-11-04 DIAGNOSIS — Z21 Asymptomatic human immunodeficiency virus [HIV] infection status: Secondary | ICD-10-CM

## 2013-11-04 DIAGNOSIS — R1115 Cyclical vomiting syndrome unrelated to migraine: Secondary | ICD-10-CM

## 2013-11-04 DIAGNOSIS — B20 Human immunodeficiency virus [HIV] disease: Secondary | ICD-10-CM

## 2013-11-04 LAB — BASIC METABOLIC PANEL WITH GFR
BUN: 29 mg/dL — ABNORMAL HIGH (ref 6–23)
CHLORIDE: 99 meq/L (ref 96–112)
CO2: 28 mEq/L (ref 19–32)
Calcium: 9.7 mg/dL (ref 8.4–10.5)
Creat: 1.63 mg/dL — ABNORMAL HIGH (ref 0.50–1.35)
GFR, EST NON AFRICAN AMERICAN: 50 mL/min — AB
GFR, Est African American: 58 mL/min — ABNORMAL LOW
Glucose, Bld: 120 mg/dL — ABNORMAL HIGH (ref 70–99)
POTASSIUM: 3.3 meq/L — AB (ref 3.5–5.3)
Sodium: 139 mEq/L (ref 135–145)

## 2013-11-04 NOTE — Progress Notes (Signed)
   Subjective:    Patient ID: Brandon [name], male    DOB: Mar 08, 1966, 48 y.o.   MRN: [id number]  HPI Here for a work in visit.  Chronic nausea with some diarrhea.  Has tried many medications and has seen GI.  Has been worsening this week.  Still able to take his ARVs and is on a highly salvage regimen with Tivicay, bid Drv/r, Combivir and tenofovir.  He has been suppressed.     Review of Systems  Constitutional: Positive for fatigue. Negative for fever.  Gastrointestinal: Positive for nausea and diarrhea. Negative for abdominal pain.  Skin: Negative for pallor.  Neurological: Negative for light-headedness.       Objective:   Physical Exam  Constitutional:  Cachectic, chronically ill appearing  HENT:  Mouth/Throat: No oropharyngeal exudate.  Eyes: No scleral icterus.  Neck:  Bilateral, 1/2 cm each side  Cardiovascular: Normal rate, regular rhythm and normal heart sounds.   No murmur heard. Pulmonary/Chest: Effort normal and breath sounds normal. No respiratory distress.  Lymphadenopathy:    He has cervical adenopathy.  Skin: No rash noted.          Assessment & Plan:

## 2013-11-04 NOTE — Assessment & Plan Note (Signed)
bolused 2 liters NS.  Feels better, no dizziness. Tolerating fluids.  Will call if any further difficulty

## 2013-11-04 NOTE — Assessment & Plan Note (Signed)
Able to take his meds.

## 2013-11-17 ENCOUNTER — Ambulatory Visit (INDEPENDENT_AMBULATORY_CARE_PROVIDER_SITE_OTHER): Payer: Medicaid Other | Admitting: Infectious Disease

## 2013-11-17 ENCOUNTER — Other Ambulatory Visit (HOSPITAL_COMMUNITY)
Admission: RE | Admit: 2013-11-17 | Discharge: 2013-11-17 | Disposition: A | Discharge status: Home / Self Care | Payer: Medicaid Other | Source: Ambulatory Visit | Attending: Infectious Disease | Admitting: Infectious Disease

## 2013-11-17 ENCOUNTER — Encounter: Payer: Self-pay | Admitting: Infectious Disease

## 2013-11-17 VITALS — BP 120/84 | HR 91 | Temp 99.0°F | Wt 119.0 lb

## 2013-11-17 DIAGNOSIS — M8700 Idiopathic aseptic necrosis of unspecified bone: Secondary | ICD-10-CM

## 2013-11-17 DIAGNOSIS — Z113 Encounter for screening for infections with a predominantly sexual mode of transmission: Secondary | ICD-10-CM | POA: Insufficient documentation

## 2013-11-17 DIAGNOSIS — Z23 Encounter for immunization: Secondary | ICD-10-CM

## 2013-11-17 DIAGNOSIS — R1115 Cyclical vomiting syndrome unrelated to migraine: Secondary | ICD-10-CM

## 2013-11-17 DIAGNOSIS — A0472 Enterocolitis due to Clostridium difficile, not specified as recurrent: Secondary | ICD-10-CM

## 2013-11-17 DIAGNOSIS — Z79899 Other long term (current) drug therapy: Secondary | ICD-10-CM

## 2013-11-17 DIAGNOSIS — Z954 Presence of other heart-valve replacement: Secondary | ICD-10-CM

## 2013-11-17 DIAGNOSIS — B20 Human immunodeficiency virus [HIV] disease: Secondary | ICD-10-CM

## 2013-11-17 DIAGNOSIS — Z952 Presence of prosthetic heart valve: Secondary | ICD-10-CM

## 2013-11-17 LAB — CBC WITH DIFFERENTIAL/PLATELET
BASOS ABS: 0 10*3/uL (ref 0.0–0.1)
Basophils Relative: 0 % (ref 0–1)
Eosinophils Absolute: 0.1 10*3/uL (ref 0.0–0.7)
Eosinophils Relative: 2 % (ref 0–5)
HCT: 34.7 % — ABNORMAL LOW (ref 39.0–52.0)
Hemoglobin: 12.4 g/dL — ABNORMAL LOW (ref 13.0–17.0)
LYMPHS PCT: 45 % (ref 12–46)
Lymphs Abs: 2.2 10*3/uL (ref 0.7–4.0)
MCH: 38.3 pg — ABNORMAL HIGH (ref 26.0–34.0)
MCHC: 35.7 g/dL (ref 30.0–36.0)
MCV: 107.1 fL — AB (ref 78.0–100.0)
Monocytes Absolute: 0.3 10*3/uL (ref 0.1–1.0)
Monocytes Relative: 7 % (ref 3–12)
Neutro Abs: 2.3 10*3/uL (ref 1.7–7.7)
Neutrophils Relative %: 46 % (ref 43–77)
Platelets: 207 10*3/uL (ref 150–400)
RBC: 3.24 MIL/uL — ABNORMAL LOW (ref 4.22–5.81)
RDW: 14.2 % (ref 11.5–15.5)
WBC: 4.9 10*3/uL (ref 4.0–10.5)

## 2013-11-17 LAB — LIPID PANEL
Cholesterol: 106 mg/dL (ref 0–200)
HDL: 37 mg/dL — ABNORMAL LOW (ref >39)
LDL Cholesterol: 49 mg/dL (ref 0–99)
Total CHOL/HDL Ratio: 2.9 Ratio
Triglycerides: 102 mg/dL (ref <150)
VLDL: 20 mg/dL (ref 0–40)

## 2013-11-17 LAB — COMPLETE METABOLIC PANEL WITH GFR
ALK PHOS: 68 U/L (ref 39–117)
ALT: 11 U/L (ref 0–53)
AST: 14 U/L (ref 0–37)
Albumin: 3.8 g/dL (ref 3.5–5.2)
BUN: 10 mg/dL (ref 6–23)
CO2: 29 mEq/L (ref 19–32)
Calcium: 8.9 mg/dL (ref 8.4–10.5)
Chloride: 107 mEq/L (ref 96–112)
Creat: 1.19 mg/dL (ref 0.50–1.35)
GFR, Est African American: 84 mL/min
GFR, Est Non African American: 73 mL/min
Glucose, Bld: 79 mg/dL (ref 70–99)
POTASSIUM: 3.3 meq/L — AB (ref 3.5–5.3)
SODIUM: 140 meq/L (ref 135–145)
TOTAL PROTEIN: 6.4 g/dL (ref 6.0–8.3)
Total Bilirubin: 0.4 mg/dL (ref 0.2–1.2)

## 2013-11-17 MED ORDER — VALACYCLOVIR HCL 1 G PO TABS: 1000.0000 mg | ORAL_TABLET | Freq: Every day | ORAL | Status: DC

## 2013-11-17 MED ORDER — OXYCODONE HCL 20 MG/ML PO CONC: 10.0000 mg | Freq: Three times a day (TID) | ORAL | Status: DC | PRN

## 2013-11-17 MED ORDER — METRONIDAZOLE 500 MG PO TABS: 500.0000 mg | ORAL_TABLET | Freq: Three times a day (TID) | ORAL | Status: DC

## 2013-11-17 NOTE — Progress Notes (Signed)
Subjective:    Patient ID: Brandon [name], male    DOB: [birth year], 48 y.o.   MRN: [id number]  HPI   Mr Adrian is a highly complicated man with HIV/AIDS and Multi-DRUG RESISTANT virus formerly followed at [facility]. Apparently he had been taken off ARV and placed with hospice.  He has been on various complicated antiretroviral regimens in the past, with unfortunate GENOTYPIC resistance to all non-nucleoside reverse transcriptase inhibitors and all NRTIs, Resistance to all protease inhibitors with the exception of Prezista which had some activity genotypically,, Resistance to Isentress, and Elvitegravir and 100X reduced S to dolutegravir having both a 148H and 140S  and with Dual tropic virus.  When he had at [facility]  on a regimen of Twice daily Prezista, Norvir, twice daily AZT, once daily Viread, with these meds stopped n 02/2012. Prior to this had been on similar regimen but with isentress and maraviroc (despite IN R and dual mixed virus. At that time Dolutegravir was not available)  02/12/2010 phenotype at [facility] showed:  RT: NRTI: ABC, DDI, D4T, AZT, TDF: resistant; 3TC, FTC: susceptible; NNRTI: EFV susceptible; RPV, NVP, ETR, DLV: Resistant; PI: pan-resistant  He had decided now to go back onto ARVS and is referred to [facility].  We  Have seen him and placed him on a  salvage regimen of Prezista 600mg   Twice daily boosted with Norvir 100mg  twice daily, Tivicay twice daily, Combivir twice daily and once daily Viread.  And since then he has now an UNDETECTABLE VIRAL LOAD <20.  And CD4 >200  when las checked in April. He has maintained perfect virological suppresion for past 12 months.  He continues to suffer from chronic nausea and vomiting with diarrhea.  We had tried to see if addition of compazine might help and it did stablize things for a bit but he then had worsening and my partner Dr. Baxter Flattery arranged for hospital admission but he left within 24 hours because of anger when he was  given wrong dose of Prezista  He comes in today with worsening nausea, and vomiting. He has noticed that is the worst in the am when he goes to make BM int he bathroom. He has been able to keep his antiretrovirals down on most days with the exception of proximally one day per week.  One year ago he had an upper endoscopy performed by Carol Ada which showed no esophageal abnormalities and a nonbleeding avascular malformation.  During last visit I had him admitted to the hospital for evaluation of his nausea and vomiting. He had one episode of loose stools that was worked up and C Difficile PCR was + and he was put on flagyl. He was seen by GI (Dr. Carlean Purl) and put on glycopyrrolate. He is also being evluated by GI at [facility]. He is on MSIR that we have continued from when he was on hospice at [facility] along with prescription for MS contin which Medicaid is no refusing to fill. These meds were to treat diffuse osteoarthritic pains and to ease his suffering, abdominal pain as he was on hospice and it is likely now I suspect that we have cotninued him on these meds that medicaid is questioning validity and need for them. I will investigate further on organic causes of pain that truly merit the narcotics we continued from hospice when he was literally on "death's door" now that he is more than 14 months out with VL <20 and reasonably healthy CD4 count.  Lab  Results  Component Value Date   HIV1RNAQUANT <20 08/23/2013     Lab Results  Component Value Date   CD4TABS 140* 08/23/2013   CD4TABS 210* 06/21/2013   CD4TABS 170* 02/19/2013    He was seen by my partner Dr. Linus Salmons a few weeks ago when he had still some intractable nausea and vomiting and was dehydrated and given several liters of fluid. He felt better afterwards and has had less vomiting but still some nausea he states he has still loose bowel movements up to 7-8 times per day. He has not taking any narcotics at present as they have not been  represcribed he asked if he could take liquid oxycodone so the morphine sulfate immediate release as it is more easy for him to take this without vomiting.  He also relates a history of being better able to ingest boost particular flavor rather than the conventional and sure or boost.  Review of Systems  Constitutional: Positive for fatigue. Negative for fever, chills, diaphoresis, activity change, appetite change and unexpected weight change.  HENT: Negative for congestion, rhinorrhea, sinus pressure, sneezing, sore throat and trouble swallowing.   Eyes: Negative for photophobia and visual disturbance.  Respiratory: Negative for cough, chest tightness, shortness of breath, wheezing and stridor.   Cardiovascular: Negative for chest pain, palpitations and leg swelling.  Gastrointestinal: Positive for nausea, vomiting and diarrhea. Negative for constipation, abdominal distention and anal bleeding.  Genitourinary: Negative for dysuria, hematuria, flank pain and difficulty urinating.  Musculoskeletal: Negative for arthralgias, back pain, gait problem, joint swelling and myalgias.  Skin: Negative for color change, rash and wound.  Neurological: Negative for dizziness, tremors, weakness and light-headedness.  Hematological: Negative for adenopathy. Does not bruise/bleed easily.  Psychiatric/Behavioral: Negative for behavioral problems, confusion, sleep disturbance, decreased concentration and agitation.       Objective:   Physical Exam  Constitutional: He is oriented to person, place, and time. No distress.  HENT:  Head: Normocephalic and atraumatic.  Mouth/Throat: Posterior oropharyngeal edema present. No oropharyngeal exudate.  Eyes: Conjunctivae and EOM are normal. Pupils are equal, round, and reactive to light. Right eye exhibits no discharge and no exudate. Left eye exhibits no discharge and no exudate. Right conjunctiva is not injected. Right conjunctiva has no hemorrhage. Left conjunctiva  is not injected. Left conjunctiva has no hemorrhage.  Neck: Normal range of motion. Neck supple.  Cardiovascular: Regular rhythm and normal heart sounds.  Exam reveals no gallop and no friction rub.   No murmur heard. Pulmonary/Chest: Effort normal and breath sounds normal. No respiratory distress. He has no wheezes. He has no rales.  Abdominal: Soft. Bowel sounds are normal. He exhibits no mass. There is tenderness. There is no rebound and no guarding.  Musculoskeletal: He exhibits no edema and no tenderness.  Lymphadenopathy:    He has no cervical adenopathy.  Neurological: He is alert and oriented to person, place, and time. He exhibits normal muscle tone. Coordination normal.  Skin: Skin is warm and dry. No erythema. No pallor.  Psychiatric: His behavior is normal. Judgment and thought content normal. His mood appears not anxious. He does not exhibit a depressed mood.          Assessment & Plan:   #1 HIV: Highly Resistant Virus.  Continue the  --Tivicay 50mg  BID --Prezista 600mg  BID with  --Norvir 100mg  BID --Combivir 1 tablet BID --Viread q daily  HE SHOULD NOT TAKE ENSURE AT THE SAME TIME AS HIS TIVICAY!!!!  OI prophylaxis: continue dapsone for  now but if CD4 still above 200,  will discontinue it  Nausea and vomiting: hx of AVM on anticoaguation. GI saw him and saw no need for repeat endoscopic eval. They started glycopyrrolate and advised against using marijuana in case he was having cyclic vomiting related to [facility]  Depression: Continue SSRI. Note he has multiple stressors related to both his own health his wife. His wife had non-Hodgkin lymphoma that was successfully treated and she was here today in clinic with him  Pain: I will give him oxycodone liquid for now   AVR: mechanical on coumadin followed at [facility].   Diarrhea: Could certainly be a recurrence of his Clostridium difficile colitis we'll give him a 14 day course of Flagyl.  Underweight: Will give him boost  prescription

## 2013-11-18 LAB — T-HELPER CELL (CD4) - (RCID CLINIC ONLY)
CD4 % Helper T Cell: 9 % — ABNORMAL LOW (ref 33–55)
CD4 T Cell Abs: 200 /uL — ABNORMAL LOW (ref 400–2700)

## 2013-11-18 LAB — RPR

## 2013-11-19 LAB — HIV-1 RNA QUANT-NO REFLEX-BLD: HIV-1 RNA Quant, Log: <1.3 {Log} (ref <1.30)

## 2013-12-03 ENCOUNTER — Other Ambulatory Visit: Payer: Self-pay | Admitting: *Deleted

## 2013-12-03 ENCOUNTER — Other Ambulatory Visit: Payer: Self-pay | Admitting: Infectious Disease

## 2013-12-03 MED ORDER — PROCHLORPERAZINE MALEATE 10 MG PO TABS: 10.0000 mg | ORAL_TABLET | Freq: Four times a day (QID) | ORAL | Status: DC | PRN

## 2013-12-03 MED ORDER — LORAZEPAM 1 MG PO TABS: 1.0000 mg | ORAL_TABLET | Freq: Three times a day (TID) | ORAL | Status: DC | PRN

## 2013-12-03 MED ORDER — DRONABINOL 5 MG PO CAPS: 5.0000 mg | ORAL_CAPSULE | Freq: Two times a day (BID) | ORAL | Status: DC

## 2013-12-06 ENCOUNTER — Telehealth: Payer: Self-pay | Admitting: *Deleted

## 2013-12-06 DIAGNOSIS — B20 Human immunodeficiency virus [HIV] disease: Secondary | ICD-10-CM

## 2013-12-06 MED ORDER — ZOLPIDEM TARTRATE 10 MG PO TABS: 10.0000 mg | ORAL_TABLET | Freq: Every evening | ORAL | Status: DC | PRN

## 2013-12-06 NOTE — Telephone Encounter (Signed)
Controlled substance refill - Zolpidem tartrate 10 mg tablets.  MD please give specifics re:  Refill.

## 2013-12-06 NOTE — Telephone Encounter (Signed)
He can have 10mg  po qhs prn, #30 with 3 refills

## 2013-12-06 NOTE — Addendum Note (Signed)
Addended by: Lorne Skeens D on: 12/06/2013 04:22 PM   Modules accepted: Orders

## 2013-12-07 ENCOUNTER — Telehealth: Payer: Self-pay | Admitting: *Deleted

## 2013-12-07 ENCOUNTER — Other Ambulatory Visit: Payer: Self-pay | Admitting: Infectious Disease

## 2013-12-07 NOTE — Telephone Encounter (Signed)
Prior Authorization for Ambien rx for #30 tablets.  Obtained hard copy of PA for MD to complete.  Placed in MD box for completion.

## 2013-12-22 ENCOUNTER — Telehealth: Payer: Self-pay

## 2013-12-22 ENCOUNTER — Ambulatory Visit (INDEPENDENT_AMBULATORY_CARE_PROVIDER_SITE_OTHER): Payer: Medicaid Other | Admitting: Infectious Disease

## 2013-12-22 VITALS — BP 123/87 | HR 82 | Temp 98.7°F | Wt 120.0 lb

## 2013-12-22 DIAGNOSIS — G43A1 Cyclical vomiting, intractable: Secondary | ICD-10-CM

## 2013-12-22 DIAGNOSIS — R1115 Cyclical vomiting syndrome unrelated to migraine: Secondary | ICD-10-CM

## 2013-12-22 DIAGNOSIS — A0472 Enterocolitis due to Clostridium difficile, not specified as recurrent: Secondary | ICD-10-CM

## 2013-12-22 DIAGNOSIS — R1310 Dysphagia, unspecified: Secondary | ICD-10-CM

## 2013-12-22 DIAGNOSIS — M879 Osteonecrosis, unspecified: Secondary | ICD-10-CM

## 2013-12-22 DIAGNOSIS — I10 Essential (primary) hypertension: Secondary | ICD-10-CM

## 2013-12-22 DIAGNOSIS — R0981 Nasal congestion: Secondary | ICD-10-CM

## 2013-12-22 DIAGNOSIS — Z954 Presence of other heart-valve replacement: Secondary | ICD-10-CM

## 2013-12-22 DIAGNOSIS — R1110 Vomiting, unspecified: Secondary | ICD-10-CM

## 2013-12-22 DIAGNOSIS — B20 Human immunodeficiency virus [HIV] disease: Secondary | ICD-10-CM

## 2013-12-22 DIAGNOSIS — Z23 Encounter for immunization: Secondary | ICD-10-CM

## 2013-12-22 DIAGNOSIS — R1013 Epigastric pain: Secondary | ICD-10-CM

## 2013-12-22 DIAGNOSIS — M8700 Idiopathic aseptic necrosis of unspecified bone: Secondary | ICD-10-CM

## 2013-12-22 DIAGNOSIS — R197 Diarrhea, unspecified: Secondary | ICD-10-CM

## 2013-12-22 DIAGNOSIS — Z952 Presence of prosthetic heart valve: Secondary | ICD-10-CM

## 2013-12-22 DIAGNOSIS — A047 Enterocolitis due to Clostridium difficile: Secondary | ICD-10-CM

## 2013-12-22 LAB — COMPLETE METABOLIC PANEL WITH GFR
ALK PHOS: 73 U/L (ref 39–117)
ALT: 11 U/L (ref 0–53)
AST: 27 U/L (ref 0–37)
Albumin: 3.8 g/dL (ref 3.5–5.2)
BUN: 13 mg/dL (ref 6–23)
CO2: 25 mEq/L (ref 19–32)
Calcium: 9.2 mg/dL (ref 8.4–10.5)
Chloride: 103 mEq/L (ref 96–112)
Creat: 1.31 mg/dL (ref 0.50–1.35)
GFR, EST NON AFRICAN AMERICAN: 65 mL/min
GFR, Est African American: 75 mL/min
Glucose, Bld: 90 mg/dL (ref 70–99)
Potassium: 4.1 mEq/L (ref 3.5–5.3)
SODIUM: 142 meq/L (ref 135–145)
TOTAL PROTEIN: 6.9 g/dL (ref 6.0–8.3)
Total Bilirubin: 0.3 mg/dL (ref 0.3–1.2)

## 2013-12-22 LAB — CBC WITH DIFFERENTIAL/PLATELET
Basophils Absolute: 0 10*3/uL (ref 0.0–0.1)
Basophils Relative: 1 % (ref 0–1)
Eosinophils Absolute: 0.2 10*3/uL (ref 0.0–0.7)
Eosinophils Relative: 4 % (ref 0–5)
HEMATOCRIT: 38.9 % — AB (ref 39.0–52.0)
Hemoglobin: 13.8 g/dL (ref 13.0–17.0)
Lymphocytes Relative: 34 % (ref 12–46)
Lymphs Abs: 1.5 10*3/uL (ref 0.7–4.0)
MCH: 38.9 pg — ABNORMAL HIGH (ref 26.0–34.0)
MCHC: 35.5 g/dL (ref 30.0–36.0)
MCV: 109.6 fL — AB (ref 78.0–100.0)
Monocytes Absolute: 0.3 10*3/uL (ref 0.1–1.0)
Monocytes Relative: 7 % (ref 3–12)
NEUTROS ABS: 2.4 10*3/uL (ref 1.7–7.7)
Neutrophils Relative %: 54 % (ref 43–77)
Platelets: 189 10*3/uL (ref 150–400)
RBC: 3.55 MIL/uL — ABNORMAL LOW (ref 4.22–5.81)
RDW: 13.2 % (ref 11.5–15.5)
WBC: 4.5 10*3/uL (ref 4.0–10.5)

## 2013-12-22 LAB — PROTIME-INR
INR: 2.71 — AB (ref <1.50)
Prothrombin Time: 29 seconds — ABNORMAL HIGH (ref 11.6–15.2)

## 2013-12-22 LAB — LACTIC ACID, PLASMA: LACTIC ACID: 1.1 mmol/L (ref 0.5–2.2)

## 2013-12-22 MED ORDER — OXYCODONE HCL 20 MG/ML PO CONC: 10.0000 mg | Freq: Three times a day (TID) | ORAL | Status: DC | PRN

## 2013-12-22 MED ORDER — SODIUM CHLORIDE 0.9 % IV SOLN: INTRAVENOUS | Status: DC

## 2013-12-22 NOTE — Progress Notes (Signed)
Reported vital signs to Dr. Tommy Medal.  Verbal order to infuse another liter of 0.9% Normal Saline IV fluids.  Keep Dr. Baxter Flattery informed of pt's condition.  Call Dr. Tommy Medal at [facility] for additional orders after IV fluids have infused.

## 2013-12-22 NOTE — Progress Notes (Signed)
Subjective:    Patient ID: Brandon [name], male    DOB: Feb 10, 1966, 48 y.o.   MRN: [id number]  HPI   Brandon [name] is a highly complicated man with HIV/AIDS and Multi-DRUG RESISTANT virus formerly followed at [facility]. Apparently he had been taken off ARV and placed with hospice.  He has been on various complicated antiretroviral regimens in the past, with unfortunate GENOTYPIC resistance to all non-nucleoside reverse transcriptase inhibitors and all NRTIs, Resistance to all protease inhibitors with the exception of Prezista which had some activity genotypically,, Resistance to Isentress, and Elvitegravir and 100X reduced S to dolutegravir having both a 148H and 140S  and with Dual tropic virus.  02/12/2010 phenotype at [facility] showed:  RT: NRTI: ABC, DDI, D4T, AZT, TDF: resistant; 3TC, FTC: susceptible; NNRTI: EFV susceptible; RPV, NVP, ETR, DLV: Resistant; PI: pan-resistant  He had decided now to go back onto ARVS and is referred to [facility].  We  Have seen him and placed him on a  salvage regimen of Prezista 600mg   Twice daily boosted with Norvir 100mg  twice daily, Tivicay twice daily, Combivir twice daily and once daily Viread.  And since then he has now an UNDETECTABLE VIRAL LOAD <20.  And CD4 >200  when las checked in [place] this month. He has maintained perfect virological suppresion for past 18+ months.  Lab Results  Component Value Date   HIV1RNAQUANT <20 11/17/2013   Lab Results  Component Value Date   CD4TABS 200* 11/17/2013   CD4TABS 140* 08/23/2013   CD4TABS 210* 06/21/2013     He continues to suffer from chronic nausea and vomiting with diarrhea.  One year ago he had an upper endoscopy performed by Carol Ada which showed no esophageal abnormalities and a nonbleeding avascular malformation.  Recently  I had him admitted to the hospital for evaluation of his nausea and vomiting. He had one episode of loose stools that was worked up and C Difficile PCR was + and he was put  on flagyl. He was seen by GI (Dr. Carlean Purl) and put on glycopyrrolate. Dr. Carlean Purl had some concern for cannabis induced cyclic vomiting as well.   He is also being evluated by GI at [facility]   Today he comes to the clinic acutely due to severe feelings of weakness and seeing spots. This morning after eating some breakdown around breakfast he developed severe nausea with non-bloody vomiting of food and clear vomitus. He then had one loose bowel movement. He became profoundly weak and as mentioned became dizzy with standing seeing spots. He did not want emergency room was 1, for clinic. In the clinic he is lying on his back with his eyes covered by a hat. He appears dehydrated.   Review of Systems  Constitutional: Positive for fatigue. Negative for fever, chills, diaphoresis, activity change, appetite change and unexpected weight change.  HENT: Negative for congestion, rhinorrhea, sinus pressure, sneezing, sore throat and trouble swallowing.   Eyes: Negative for photophobia and visual disturbance.  Respiratory: Negative for cough, chest tightness, shortness of breath, wheezing and stridor.   Cardiovascular: Negative for chest pain, palpitations and leg swelling.  Gastrointestinal: Positive for nausea, vomiting, abdominal pain and diarrhea. Negative for constipation, abdominal distention and anal bleeding.  Genitourinary: Negative for dysuria, hematuria, flank pain and difficulty urinating.  Musculoskeletal: Negative for arthralgias, back pain, gait problem, joint swelling and myalgias.  Skin: Negative for color change, rash and wound.  Neurological: Positive for dizziness and light-headedness. Negative for tremors and weakness.  Hematological: Negative for adenopathy. Does not bruise/bleed easily.  Psychiatric/Behavioral: Negative for behavioral problems, confusion, sleep disturbance, decreased concentration and agitation.       Objective:   Physical Exam  Constitutional: He is oriented to  person, place, and time. No distress.  HENT:  Head: Normocephalic and atraumatic.  Mouth/Throat: Mucous membranes are not dry. No oropharyngeal exudate or posterior oropharyngeal edema.  Eyes: Conjunctivae and EOM are normal. Pupils are equal, round, and reactive to light. Right eye exhibits no discharge and no exudate. Left eye exhibits no discharge and no exudate. Right conjunctiva is not injected. Right conjunctiva has no hemorrhage. Left conjunctiva is not injected. Left conjunctiva has no hemorrhage.  Neck: Normal range of motion. Neck supple.  Cardiovascular: Regular rhythm and normal heart sounds.  Bradycardia present.  Exam reveals no gallop and no friction rub.   No murmur heard. Metallic click of prosthetic valve at RUSB  Pulmonary/Chest: Effort normal and breath sounds normal. No respiratory distress. He has no wheezes. He has no rales.  Abdominal: Soft. Bowel sounds are normal. He exhibits no mass. There is tenderness in the right upper quadrant and epigastric area. There is guarding. There is no rigidity and no rebound.  Musculoskeletal: He exhibits no edema and no tenderness.  Lymphadenopathy:    He has no cervical adenopathy.  Neurological: He is alert and oriented to person, place, and time. He exhibits normal muscle tone. Coordination normal.  Skin: Skin is warm and dry. No erythema. No pallor.  Psychiatric: His behavior is normal. Judgment and thought content normal. His mood appears not anxious. He does not exhibit a depressed mood.          Assessment & Plan:    #1 Nausea and vomiting with loose stools, abdominal pain:  Will check stat CMP CBC with differential INR lactic acid.  Will bolus him with normal saline. We'll recheck his vital signs including orthostatics post infusion of his normal saline. At present he does not want to come into the hospital was 2 his labs look like and how he looks after he was given fluids   #2 HIV: Highly Resistant Virus.  Continue  the  --Tivicay 50mg  BID --Prezista 600mg  BID with  --Norvir 100mg  BID --Combivir 1 tablet BID --Viread q daily  HE SHOULD NOT TAKE ENSURE AT THE SAME TIME AS HIS TIVICAY!!!!  Consider DC dapsone at next visit  Nausea and vomiting: hx of AV replacement on anticoaguation.   Depression: Continue SSRI. Note he has multiple stressors related to both his own health his wife. His wife had non-Hodgkin lymphoma that was successfully treated and she was here today in clinic with him  Pain: I will give him oxycodone liquid for now  AVR: mechanical on coumadin followed at [facility].   Dental caries: Dental work done next week with bridging with Lovenox

## 2013-12-22 NOTE — Telephone Encounter (Signed)
Patient's wife called c/o he is having chills, nausea and vomiting that started this morning.  He woke up fine and had breakfast.  The symptoms started later in the morning.   Per Dr Tommy Medal patient was advised to come to office now for evaluation.   Laverle Patter, RN

## 2014-01-04 ENCOUNTER — Other Ambulatory Visit: Payer: Self-pay | Admitting: *Deleted

## 2014-01-04 ENCOUNTER — Other Ambulatory Visit: Payer: Self-pay | Admitting: Infectious Disease

## 2014-01-04 DIAGNOSIS — B20 Human immunodeficiency virus [HIV] disease: Secondary | ICD-10-CM

## 2014-01-04 MED ORDER — DOLUTEGRAVIR SODIUM 50 MG PO TABS: 50.0000 mg | ORAL_TABLET | Freq: Two times a day (BID) | ORAL | Status: DC

## 2014-01-26 ENCOUNTER — Encounter: Payer: Self-pay | Admitting: Infectious Disease

## 2014-01-26 ENCOUNTER — Ambulatory Visit (INDEPENDENT_AMBULATORY_CARE_PROVIDER_SITE_OTHER): Payer: Medicaid Other | Admitting: Infectious Disease

## 2014-01-26 ENCOUNTER — Other Ambulatory Visit (HOSPITAL_COMMUNITY)
Admission: RE | Admit: 2014-01-26 | Discharge: 2014-01-26 | Disposition: A | Discharge status: Home / Self Care | Payer: Medicaid Other | Source: Ambulatory Visit | Attending: Infectious Disease | Admitting: Infectious Disease

## 2014-01-26 VITALS — BP 149/94 | HR 76 | Temp 99.4°F | Wt 128.0 lb

## 2014-01-26 DIAGNOSIS — Z21 Asymptomatic human immunodeficiency virus [HIV] infection status: Secondary | ICD-10-CM

## 2014-01-26 DIAGNOSIS — F32A Depression, unspecified: Secondary | ICD-10-CM

## 2014-01-26 DIAGNOSIS — F1120 Opioid dependence, uncomplicated: Secondary | ICD-10-CM

## 2014-01-26 DIAGNOSIS — R1115 Cyclical vomiting syndrome unrelated to migraine: Secondary | ICD-10-CM

## 2014-01-26 DIAGNOSIS — A047 Enterocolitis due to Clostridium difficile: Secondary | ICD-10-CM

## 2014-01-26 DIAGNOSIS — Z23 Encounter for immunization: Secondary | ICD-10-CM

## 2014-01-26 DIAGNOSIS — F329 Major depressive disorder, single episode, unspecified: Secondary | ICD-10-CM

## 2014-01-26 DIAGNOSIS — B20 Human immunodeficiency virus [HIV] disease: Secondary | ICD-10-CM

## 2014-01-26 DIAGNOSIS — Z113 Encounter for screening for infections with a predominantly sexual mode of transmission: Secondary | ICD-10-CM | POA: Insufficient documentation

## 2014-01-26 DIAGNOSIS — G43A1 Cyclical vomiting, intractable: Secondary | ICD-10-CM

## 2014-01-26 DIAGNOSIS — M8700 Idiopathic aseptic necrosis of unspecified bone: Secondary | ICD-10-CM

## 2014-01-26 DIAGNOSIS — A0472 Enterocolitis due to Clostridium difficile, not specified as recurrent: Secondary | ICD-10-CM

## 2014-01-26 DIAGNOSIS — M879 Osteonecrosis, unspecified: Secondary | ICD-10-CM

## 2014-01-26 DIAGNOSIS — R39198 Other difficulties with micturition: Secondary | ICD-10-CM

## 2014-01-26 DIAGNOSIS — R3989 Other symptoms and signs involving the genitourinary system: Secondary | ICD-10-CM

## 2014-01-26 DIAGNOSIS — F1920 Other psychoactive substance dependence, uncomplicated: Secondary | ICD-10-CM

## 2014-01-26 DIAGNOSIS — Z952 Presence of prosthetic heart valve: Secondary | ICD-10-CM

## 2014-01-26 DIAGNOSIS — Z954 Presence of other heart-valve replacement: Secondary | ICD-10-CM

## 2014-01-26 LAB — COMPLETE METABOLIC PANEL WITH GFR
ALT: 9 U/L (ref 0–53)
AST: 15 U/L (ref 0–37)
Albumin: 4 g/dL (ref 3.5–5.2)
Alkaline Phosphatase: 77 U/L (ref 39–117)
BUN: 13 mg/dL (ref 6–23)
CALCIUM: 9.3 mg/dL (ref 8.4–10.5)
CHLORIDE: 107 meq/L (ref 96–112)
CO2: 28 meq/L (ref 19–32)
CREATININE: 1.21 mg/dL (ref 0.50–1.35)
GFR, EST AFRICAN AMERICAN: 82 mL/min
GFR, Est Non African American: 71 mL/min
Glucose, Bld: 92 mg/dL (ref 70–99)
POTASSIUM: 4.1 meq/L (ref 3.5–5.3)
Sodium: 140 mEq/L (ref 135–145)
Total Bilirubin: 0.4 mg/dL (ref 0.2–1.2)
Total Protein: 6.7 g/dL (ref 6.0–8.3)

## 2014-01-26 LAB — PHOSPHORUS: Phosphorus: 2.5 mg/dL (ref 2.3–4.6)

## 2014-01-26 MED ORDER — OXYCODONE HCL 20 MG/ML PO CONC: 10.0000 mg | Freq: Three times a day (TID) | ORAL | Status: DC | PRN

## 2014-01-26 NOTE — Progress Notes (Signed)
Subjective:    Patient ID: Brandon [name], male    DOB: 1966/02/01, 48 y.o.   MRN: [id number]  HPI   Mr Carmicheal is a highly complicated man with HIV/AIDS and Multi-DRUG RESISTANT virus formerly followed at [facility]. Apparently he had been taken off ARV and placed with hospice.  He has been on various complicated antiretroviral regimens in the past, with unfortunate GENOTYPIC resistance to all non-nucleoside reverse transcriptase inhibitors and all NRTIs, Resistance to all protease inhibitors with the exception of Prezista which had some activity genotypically,, Resistance to Isentress, and Elvitegravir and 100X reduced S to dolutegravir having both a 148H and 140S  and with Dual tropic virus.  02/12/2010 phenotype at [facility] showed:  RT: NRTI: ABC, DDI, D4T, AZT, TDF: resistant; 3TC, FTC: susceptible; NNRTI: EFV susceptible; RPV, NVP, ETR, DLV: Resistant; PI: pan-resistant  He had decided now to go back onto ARVS and is referred to [facility].  We  Have seen him and placed him on a  salvage regimen of Prezista 600mg   Twice daily boosted with Norvir 100mg  twice daily, Tivicay twice daily, Combivir twice daily and once daily Viread.  And since then he has now an UNDETECTABLE VIRAL LOAD <20.  And CD4 >200  when las checked in [place] this month. He has maintained perfect virological suppresion for past 18+ months.  Lab Results  Component Value Date   HIV1RNAQUANT <20 11/17/2013   Lab Results  Component Value Date   CD4TABS 200* 11/17/2013   CD4TABS 140* 08/23/2013   CD4TABS 210* 06/21/2013     He continues to suffer from chronic nausea  But no longer vomiting since the MS contin was stopped being covered by Medicaid.  Today he has c/o difficulty emptying his bladder x 2 weeks.  He is on amoxicillin for tooth extraction x 2 weeks as well  He asked for his pain meds but I noticed on med reconciliation that he received TWO narcotics rx from other providers on 01/06/14 and 01/11/14 for  oxy/acet 10/325 #30 and hydro/acet #30. He says these were from his dentist and he did not know that this was a condition of the pain contract he signed--which I showed him.  He and his wife are also suffering from stress of learning that one daughter in [place] has breast cancer in both breasts and there is heavy hx in pts wife.  Other daughter is in [place] and they are anxious for her due to recent events in [place].  Review of Systems  Constitutional: Positive for fatigue. Negative for fever, chills, diaphoresis, activity change, appetite change and unexpected weight change.  HENT: Negative for congestion, rhinorrhea, sinus pressure, sneezing, sore throat and trouble swallowing.   Eyes: Negative for photophobia and visual disturbance.  Respiratory: Negative for cough, chest tightness, shortness of breath, wheezing and stridor.   Cardiovascular: Negative for chest pain, palpitations and leg swelling.  Gastrointestinal: Positive for nausea and abdominal pain. Negative for vomiting, constipation, abdominal distention and anal bleeding.  Endocrine: Positive for polyuria.  Genitourinary: Positive for difficulty urinating. Negative for dysuria, hematuria and flank pain.  Musculoskeletal: Negative for myalgias, back pain, joint swelling, arthralgias and gait problem.  Skin: Negative for color change, rash and wound.  Neurological: Negative for tremors and weakness.  Hematological: Negative for adenopathy. Does not bruise/bleed easily.  Psychiatric/Behavioral: Negative for behavioral problems, confusion, sleep disturbance, decreased concentration and agitation.       Objective:   Physical Exam  Constitutional: He is oriented to person,  place, and time. No distress.  HENT:  Head: Normocephalic and atraumatic.  Mouth/Throat: Oropharynx is clear and moist. No oropharyngeal exudate.  Eyes: Conjunctivae and EOM are normal. No scleral icterus.  Neck: Normal range of motion. Neck supple. No JVD present.   Cardiovascular: Normal rate, regular rhythm and normal heart sounds.   Pulmonary/Chest: Effort normal. No respiratory distress. He has no wheezes.  Abdominal: He exhibits no distension. There is no tenderness.  Musculoskeletal: He exhibits no edema or tenderness.  Lymphadenopathy:    He has no cervical adenopathy.  Neurological: He is alert and oriented to person, place, and time. He exhibits normal muscle tone. Coordination normal.  Skin: Skin is warm and dry. He is not diaphoretic. No erythema. No pallor.  Psychiatric: He has a normal mood and affect. His behavior is normal. Judgment and thought content normal.          Assessment & Plan:    #1 Difficulty urinating: could be prostatitis: check UA and reflex culture, consider empiric abx with caution given hx of CDI  #2 Dental problems has seen dentist and finishing round of amox   #3HIV: Highly Resistant Virus.  Continue the  --Tivicay 50mg  BID --Prezista 600mg  BID with  --Norvir 100mg  BID --Combivir 1 tablet BID --Viread q daily  HE SHOULD NOT TAKE ENSURE AT THE SAME TIME AS HIS TIVICAY!!!!  Consider DC dapsone if cd4 is 190 or greater today  Nausea and vomiting: hx of AV replacement on anticoaguation.   Depression: Continue SSRI. Note he has multiple stressors related to both his own health his wife. His wife had non-Hodgkin lymphoma that was successfully treated and she was here today in clinic with him as well as his two daughters. I spent greater than 40 minutes with the patient including greater than 50% of time in face to face counsel of the patient and in coordination of their care.   Pain: he has broken his pain contract. I gave him script telling him I was willing to make this Deale HIM. I will however investigate further where he has received the two scripts that were pulled in and if he has been doing this in the past. Will also tox screen him and opiate screen him. Will likely be in his best  interest to get him off the narcotics  AVR: mechanical on coumadin followed at [facility].

## 2014-01-27 LAB — URINE CYTOLOGY ANCILLARY ONLY
CHLAMYDIA, DNA PROBE: NEGATIVE
Neisseria Gonorrhea: NEGATIVE

## 2014-01-27 LAB — URINALYSIS, MICROSCOPIC ONLY
BACTERIA UA: NONE SEEN
CASTS: NONE SEEN
CRYSTALS: NONE SEEN
SQUAMOUS EPITHELIAL / LPF: NONE SEEN

## 2014-01-27 LAB — DRUG SCREEN, URINE
AMPHETAMINE SCRN UR: NEGATIVE
Barbiturate Quant, Ur: NEGATIVE
Benzodiazepines.: NEGATIVE
COCAINE METABOLITES: NEGATIVE
Creatinine,U: 226.08 mg/dL
MARIJUANA METABOLITE: POSITIVE — AB
Methadone: NEGATIVE
OPIATES: NEGATIVE
PHENCYCLIDINE (PCP): NEGATIVE
Propoxyphene: NEGATIVE

## 2014-01-27 LAB — URINALYSIS, ROUTINE W REFLEX MICROSCOPIC
Bilirubin Urine: NEGATIVE
Glucose, UA: NEGATIVE mg/dL
KETONES UR: NEGATIVE mg/dL
Leukocytes, UA: NEGATIVE
Nitrite: NEGATIVE
PH: 5.5 (ref 5.0–8.0)
Protein, ur: NEGATIVE mg/dL
Specific Gravity, Urine: 1.02 (ref 1.005–1.030)
UROBILINOGEN UA: 0.2 mg/dL (ref 0.0–1.0)

## 2014-01-27 LAB — T-HELPER CELL (CD4) - (RCID CLINIC ONLY)
CD4 T CELL ABS: 250 /uL — AB (ref 400–2700)
CD4 T CELL HELPER: 11 % — AB (ref 33–55)

## 2014-01-27 LAB — HIV-1 RNA ULTRAQUANT REFLEX TO GENTYP+
HIV 1 RNA Quant: <20 copies/mL (ref <20)
HIV-1 RNA Quant, Log: <1.3 {Log} (ref <1.30)

## 2014-01-29 LAB — OPIATES/OPIOIDS (LC/MS-MS)
Codeine Urine: NEGATIVE ng/mL (ref <50)
HYDROMORPHONE: NEGATIVE ng/mL (ref <50)
Hydrocodone: NEGATIVE ng/mL (ref <50)
MORPHINE: NEGATIVE ng/mL (ref <50)
NOROXYCODONE, UR: NEGATIVE ng/mL (ref <50)
Norhydrocodone, Ur: NEGATIVE ng/mL (ref <50)
OXYMORPHONE, URINE: NEGATIVE ng/mL (ref <50)
Oxycodone, ur: NEGATIVE ng/mL (ref <50)

## 2014-01-31 ENCOUNTER — Encounter: Payer: Self-pay | Admitting: Infectious Disease

## 2014-01-31 ENCOUNTER — Telehealth: Payer: Self-pay | Admitting: Infectious Disease

## 2014-01-31 DIAGNOSIS — Z653 Problems related to other legal circumstances: Secondary | ICD-10-CM | POA: Insufficient documentation

## 2014-01-31 NOTE — Telephone Encounter (Signed)
As per my most recent note, I had noticed on med reconciliation that Mr Teixeira had filled TWO different narcotic scripts from another provider in violation of his pain contract.  I told him that because he had violated his pain contract I would ordinarily have decided to end narcotic rx for him but I told him I would make an exception this one time based on his claimed ignorance of item # 1 in his pain contract.  I have now upon testing his urine for opiates I have found that there are ZERO opiates in his system based on drug screen, NOR DOES IT SHOW ANY BENZODIAZEPENES   which does show THC derivative which could be either THE MARINOL HE IS RX OR THE MARIJUANA THAT HE DOES smoke recreationally.  Below is the drug database I also pulled on Mr Sitter  He filled #90 oxycodones TABLETS based on THREE separate narcotic scripts from a Dentist in addition to the thirty day supply of liquid oxycodone (HE CLAIMED HE COULD NOT SWALLOW THE PILLS WELL) he received from our group in October and from me last week.       I can NO LONGER write narcotic scripts for Mr Shedden long term and am even reticent to rx him a tapering off rx of pain meds, and anxiolytics.    HE NEEDS TO BE SEEN BY MYSELF AFTER THE THANKSGIVING HOLIDAYS ALONG WITH JODIE FROM [facility]  I CERTAINLY HOPE THAT THIS DOES NOT HURT OUR THERAPEUTIC RELATIONSHIP AND THAT MR [facility]ston WILL CONTINUE TO VALUE THE TREMENDOUS PROGRESS HE HAS MADE WITH RE TO HIS HIV HAVING HAD A COMPLETLEY UNDETECTABLE VIRAL LOAD FOR MORE THAN A 1.5 YEARS.

## 2014-03-01 ENCOUNTER — Telehealth: Payer: Self-pay | Admitting: *Deleted

## 2014-03-01 ENCOUNTER — Other Ambulatory Visit: Payer: Self-pay | Admitting: Infectious Disease

## 2014-03-01 NOTE — Telephone Encounter (Signed)
Patient requesting refills of ativan and marinol (in addition to compazine and valtryx).  Please advise about these refills.  If you have stopped prescribing some medications, please update the patient's medication list so we do not accidentally refill them.  Landis Gandy, RN

## 2014-03-07 ENCOUNTER — Other Ambulatory Visit: Payer: Self-pay | Admitting: Infectious Disease

## 2014-03-07 NOTE — Telephone Encounter (Signed)
I have tried to update the chart. I am no longer going to rx controlled substances for Brandon [name] including the marinol He can have compazine, anti-emetics, his complicated ARV regimen which he has done very well with.

## 2014-03-07 NOTE — Telephone Encounter (Signed)
Thanks

## 2014-03-31 ENCOUNTER — Ambulatory Visit (INDEPENDENT_AMBULATORY_CARE_PROVIDER_SITE_OTHER): Payer: Medicaid Other | Admitting: Infectious Disease

## 2014-03-31 ENCOUNTER — Other Ambulatory Visit (HOSPITAL_COMMUNITY)
Admission: RE | Admit: 2014-03-31 | Discharge: 2014-03-31 | Disposition: A | Discharge status: Home / Self Care | Payer: Medicaid Other | Source: Ambulatory Visit | Attending: Infectious Disease | Admitting: Infectious Disease

## 2014-03-31 ENCOUNTER — Encounter: Payer: Self-pay | Admitting: Infectious Disease

## 2014-03-31 VITALS — BP 118/78 | HR 80 | Temp 99.0°F | Ht 69.0 in | Wt 157.0 lb

## 2014-03-31 DIAGNOSIS — F339 Major depressive disorder, recurrent, unspecified: Secondary | ICD-10-CM | POA: Insufficient documentation

## 2014-03-31 DIAGNOSIS — F1120 Opioid dependence, uncomplicated: Secondary | ICD-10-CM | POA: Insufficient documentation

## 2014-03-31 DIAGNOSIS — Z113 Encounter for screening for infections with a predominantly sexual mode of transmission: Secondary | ICD-10-CM

## 2014-03-31 DIAGNOSIS — R1115 Cyclical vomiting syndrome unrelated to migraine: Secondary | ICD-10-CM | POA: Insufficient documentation

## 2014-03-31 DIAGNOSIS — F1129 Opioid dependence with unspecified opioid-induced disorder: Secondary | ICD-10-CM

## 2014-03-31 DIAGNOSIS — Z79899 Other long term (current) drug therapy: Secondary | ICD-10-CM

## 2014-03-31 DIAGNOSIS — G43A0 Cyclical vomiting, not intractable: Secondary | ICD-10-CM

## 2014-03-31 DIAGNOSIS — B20 Human immunodeficiency virus [HIV] disease: Secondary | ICD-10-CM

## 2014-03-31 DIAGNOSIS — R29898 Other symptoms and signs involving the musculoskeletal system: Secondary | ICD-10-CM | POA: Insufficient documentation

## 2014-03-31 DIAGNOSIS — Z952 Presence of prosthetic heart valve: Secondary | ICD-10-CM

## 2014-03-31 DIAGNOSIS — M6289 Other specified disorders of muscle: Secondary | ICD-10-CM

## 2014-03-31 DIAGNOSIS — Z954 Presence of other heart-valve replacement: Secondary | ICD-10-CM

## 2014-03-31 DIAGNOSIS — Z21 Asymptomatic human immunodeficiency virus [HIV] infection status: Secondary | ICD-10-CM

## 2014-03-31 DIAGNOSIS — M79646 Pain in unspecified finger(s): Secondary | ICD-10-CM | POA: Insufficient documentation

## 2014-03-31 DIAGNOSIS — M79644 Pain in right finger(s): Secondary | ICD-10-CM

## 2014-03-31 LAB — COMPLETE METABOLIC PANEL WITH GFR
ALBUMIN: 4 g/dL (ref 3.5–5.2)
ALT: 10 U/L (ref 0–53)
AST: 15 U/L (ref 0–37)
Alkaline Phosphatase: 73 U/L (ref 39–117)
BUN: 10 mg/dL (ref 6–23)
CHLORIDE: 104 meq/L (ref 96–112)
CO2: 29 mEq/L (ref 19–32)
CREATININE: 1.33 mg/dL (ref 0.50–1.35)
Calcium: 9.4 mg/dL (ref 8.4–10.5)
GFR, Est African American: 74 mL/min
GFR, Est Non African American: 64 mL/min
Glucose, Bld: 71 mg/dL (ref 70–99)
Potassium: 3.8 mEq/L (ref 3.5–5.3)
SODIUM: 138 meq/L (ref 135–145)
Total Bilirubin: 0.9 mg/dL (ref 0.2–1.2)
Total Protein: 6.8 g/dL (ref 6.0–8.3)

## 2014-03-31 NOTE — Progress Notes (Signed)
Subjective:    Patient ID: Brandon [name], male    DOB: [birth year], 49 y.o.   MRN: [id number]  HPI   Brandon [name] is a highly complicated man with history of  HIV/AIDS and Multi-DRUG RESISTANT virus formerly followed at [facility]. His HIV nadir was in the teens was 68 when we first met him    Apparently he had at last time at [facility] been taken  off ARV and placed with hospice.  He has been on various complicated antiretroviral regimens in the past, with unfortunate GENOTYPIC resistance to all non-nucleoside reverse transcriptase inhibitors and all NRTIs, Resistance to all protease inhibitors with the exception of Prezista which had some activity genotypically,, Resistance to Isentress, and Elvitegravir and 100X reduced S to dolutegravir having both a 148H and 140S  and with Dual tropic virus.  02/12/2010 phenotype at [facility] showed:  RT: NRTI: ABC, DDI, D4T, AZT, TDF: resistant; 3TC, FTC: susceptible; NNRTI: EFV susceptible; RPV, NVP, ETR, DLV: Resistant; PI: pan-resistant  He had decided now to go back onto ARVS and is referred to [facility].  We  Have seen him and placed him on a  salvage regimen of Prezista 636m  Twice daily boosted with Norvir 1027mtwice daily, Tivicay twice daily, Combivir twice daily and once daily Viread.  And since then he has now an UNDETECTABLE VIRAL LOAD <20.  And CD[place]200  [place]. He has maintained perfect virological suppresion since April of 2014 without EVEN a single blip.  Lab Results  Component Value Date   HIV1RNAQUANT <20 01/26/2014   Lab Results  Component Value Date   CD4TABS 250* 01/26/2014   CD4TABS 200* 11/17/2013   CD4TABS 140* 08/23/2013    At last visit I noticed he was getting narcotics from another provider (for tooth problems) in addition to h is chronic narcotics from our clinic, I gave him a 2nd chance but his tox screen failed to show ANY of the drugs we were rx him other than marinol and I have therefore  stopped rx any controlled substances to him.  Understandably pain is worse since then and he has developed new problem of dropping objects. It appears to be related to pain in his thumb left hand and along his tendon. He says he has habit of bumping doors open with this thumb.  Review of Systems  Constitutional: Positive for appetite change and fatigue. Negative for fever, chills, diaphoresis, activity change and unexpected weight change.  HENT: Negative for congestion, rhinorrhea, sinus pressure, sneezing, sore throat and trouble swallowing.   Eyes: Negative for photophobia and visual disturbance.  Respiratory: Negative for cough, chest tightness, shortness of breath, wheezing and stridor.   Cardiovascular: Negative for chest pain, palpitations and leg swelling.  Gastrointestinal: Negative for vomiting, constipation, abdominal distention and anal bleeding.  Genitourinary: Negative for dysuria, hematuria and flank pain.  Musculoskeletal: Positive for myalgias and arthralgias. Negative for back pain, joint swelling and gait problem.  Skin: Negative for color change, rash and wound.  Neurological: Positive for weakness. Negative for tremors.  Hematological: Negative for adenopathy. Does not bruise/bleed easily.  Psychiatric/Behavioral: Negative for behavioral problems, confusion, sleep disturbance, decreased concentration and agitation.       Objective:   Physical Exam  Constitutional: He is oriented to person, place, and time. No distress.  HENT:  Head: Normocephalic and atraumatic.  Mouth/Throat: Oropharynx is clear and moist. No oropharyngeal exudate.  Eyes: Conjunctivae and EOM are normal. No scleral  icterus.  Neck: Normal range of motion. Neck supple. No JVD present.  Cardiovascular: Normal rate, regular rhythm and normal heart sounds.   Pulmonary/Chest: Effort normal. No respiratory distress. He has no wheezes.  Abdominal: He exhibits no distension. There is no tenderness.    Musculoskeletal: He exhibits no edema or tenderness.       Hands: Lymphadenopathy:    He has no cervical adenopathy.  Neurological: He is alert and oriented to person, place, and time. He displays no atrophy. He exhibits normal muscle tone. Coordination normal.  Skin: Skin is warm and dry. He is not diaphoretic. No erythema. No pallor.  Psychiatric: He has a normal mood and affect. His behavior is normal. Judgment and thought content normal.          Assessment & Plan:    # 1HIV Disease, hx of AIDS,  Highly Resistant Virus.  Continue the  --Tivicay 23m BID --Prezista 6066mBID with  --Norvir 10052mID --Combivir 1 tablet BID --Viread q daily  HE SHOULD NOT TAKE ENSURE AT THE SAME TIME AS HIS TIVICAY!!!!  DC Dapsone  I spent greater than 25 minutes with the patient including greater than 50% of time in face to face counsel of the patient and in coordination of their care.  #2 Pain in thumb: likely a tendonitis: will refer to Sports medicine  #3 Chronic pain with opiate dependence: see above and phone notes have stopped rx all controlled substances   Nausea and vomiting: should get better off narcotics, Dr GesCarlean Purls also concerned re potential cannabis related cyclcic vomiting as well   hx of AV replacement on anticoaguation, will check INR for PCP  Major Depression, chronic and Recurrent Depression: Continue SSRI. Note he has multiple stressors related to both his own health his wife. His wife had non-Hodgkin lymphoma that was successfully treated and she was here today in clinic with him as well as his two daughters. He seems to be doing relatively well at present

## 2014-04-01 LAB — RPR

## 2014-04-01 LAB — CBC WITH DIFFERENTIAL/PLATELET
BASOS ABS: 0 10*3/uL (ref 0.0–0.1)
Basophils Relative: 0 % (ref 0–1)
Eosinophils Absolute: 0.1 10*3/uL (ref 0.0–0.7)
Eosinophils Relative: 2 % (ref 0–5)
HCT: 42.3 % (ref 39.0–52.0)
Hemoglobin: 14.5 g/dL (ref 13.0–17.0)
Lymphocytes Relative: 38 % (ref 12–46)
Lymphs Abs: 2.4 10*3/uL (ref 0.7–4.0)
MCH: 38.4 pg — ABNORMAL HIGH (ref 26.0–34.0)
MCHC: 34.3 g/dL (ref 30.0–36.0)
MCV: 111.9 fL — AB (ref 78.0–100.0)
MONOS PCT: 8 % (ref 3–12)
MPV: 11.1 fL (ref 8.6–12.4)
Monocytes Absolute: 0.5 10*3/uL (ref 0.1–1.0)
NEUTROS ABS: 3.3 10*3/uL (ref 1.7–7.7)
Neutrophils Relative %: 52 % (ref 43–77)
Platelets: 192 10*3/uL (ref 150–400)
RBC: 3.78 MIL/uL — ABNORMAL LOW (ref 4.22–5.81)
RDW: 13.5 % (ref 11.5–15.5)
WBC: 6.4 10*3/uL (ref 4.0–10.5)

## 2014-04-01 LAB — URINE CYTOLOGY ANCILLARY ONLY
Chlamydia: NEGATIVE
Neisseria Gonorrhea: NEGATIVE

## 2014-04-01 LAB — PREALBUMIN: Prealbumin: 29.8 mg/dL (ref 17.0–34.0)

## 2014-04-01 LAB — T-HELPER CELL (CD4) - (RCID CLINIC ONLY)
CD4 % Helper T Cell: 12 % — ABNORMAL LOW (ref 33–55)
CD4 T CELL ABS: 300 /uL — AB (ref 400–2700)

## 2014-04-01 LAB — PROTIME-INR
INR: 2.08 — ABNORMAL HIGH (ref <1.50)
Prothrombin Time: 23.4 seconds — ABNORMAL HIGH (ref 11.6–15.2)

## 2014-04-05 LAB — HIV-1 RNA QUANT-NO REFLEX-BLD
HIV 1 RNA Quant: <20 {copies}/mL
HIV-1 RNA Quant, Log: <1.3 {Log}

## 2014-04-14 ENCOUNTER — Ambulatory Visit: Payer: Medicaid Other | Admitting: Sports Medicine

## 2014-04-21 ENCOUNTER — Encounter: Payer: Self-pay | Admitting: Sports Medicine

## 2014-04-21 ENCOUNTER — Ambulatory Visit (INDEPENDENT_AMBULATORY_CARE_PROVIDER_SITE_OTHER): Payer: Medicaid Other | Admitting: Sports Medicine

## 2014-04-21 VITALS — BP 125/81 | Ht 69.0 in | Wt 127.0 lb

## 2014-04-21 DIAGNOSIS — M79645 Pain in left finger(s): Secondary | ICD-10-CM

## 2014-04-21 MED ORDER — TRIAMCINOLONE ACETONIDE 10 MG/ML IJ SUSP
5.0000 mg | Freq: Once | INTRAMUSCULAR | Status: AC
  Administered 2014-04-21: 5 mg via INTRA_ARTICULAR

## 2014-04-21 NOTE — Progress Notes (Signed)
DEZMON CONOVER - 49 y.o. male MRN [id number]  Date of birth: 06/12/1965  Referral request: Dr. Tommy Medal  Thank you for your referral of Mr. Marlett  SUBJECTIVE:  Including CC & ROS.  Patient's a 49 year old gentleman presented today with left thumb pain. Pain is present for approximately 2 months. He localizes pain to the first PIP joint of the left hand. Describes the pain as a constant dull throbbing with occasional shooting down his thumb. Reports concern because of occasional loss of grip secondary to pain. Denies any constant numbness or tingling. Treating his pain with pain medication and anti-inflammatories with no relief. He also tried some bracing however braces for the right hand do not fit properly. Denies any swelling bruising or erythema to the joint.   ROS: Review of systems otherwise negative except for information present in HPI  HISTORY: Past Medical, Surgical, Social, and Family History Reviewed & Updated per EMR. Pertinent Historical Findings include: PMH: Hypertension, cerebral artery occlusion, septic arthritis of the knee, depression anxiety, AIDS, chronic opioid dependence, aortic valve replacement Tobacco abuse  DATA REVIEWED: No x-rays available for review  PHYSICAL EXAM:  VS: BP:125/81 mmHg  HR: bpm  TEMP: ( )  RESP:   HT:5\' 9"  (175.3 cm)   WT:127 lb (57.607 kg)  BMI:18.8 HAND EXAM: General: well nourished Skin of UE: warm; dry, no rashes, lesions, ecchymosis or erythema. Vascular: radial pulses 2+ bilaterally Neurologically: Sensation to light touch upper extremities equal and intact bilaterally.        Observation: no palmar or dorsal hand edema, no swelling, no erythema, no bruising  Palpation: Moderate tenderness this to palpation over the left thumb PIP joint. No instability to the collateral ligaments of this joint. No tenderness over the first extensor compartment. No tenderness over the radial styloid process. No tenderness over the anatomical snuff  box.    ROM: Range of motion of the thumb is normal bilaterally. No signs of rigidity     Strength: Normal 5/5 strength with extension/ flexion, abduction and adduction    Special test: stable medial and lateral collateral ligaments of each finger  MSK [place]: Left thumb 1st PIP joint bone spur and calcific changes.  ASSESSMENT & PLAN: See problem based charting & AVS for pt instructions. 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  Recommendations: Discussed with patient that based on his clinical history and exam today I do not suspect he has tendinitis of his extensor compartments of the thumb. Majority of his pain is localized the PIP joint. Ultrasound evidence of bone spurring calcification. Suspect this is secondary to degenerative joint disease. Offer treatment including bracing, topical anti-inflammatories, versus a intra-articular injection. Patient was interested in intra-articular injection, none inflammation swelling. Also placed patient in a thumb spica brace for the next few days to rest this joint. Patient will follow-up when necessary based on his clinical response. Would consider getting hand x-rays for additional confirmation of the degree of arthritis in his hand joint.  Injection procedure: Consent obtained and verified. Sterile betadine prep. Furthur cleansed with alcohol and betadine. Topical analgesic spray: Ethyl chloride. Injection Indication: DJD of the PIP joint of the left thumb Approached in typical fashion with: Using ultrasound guidance patient's PIP joint was localized followed by injection out of plane into the joint space with no resistance. Patient joint was also distracted to provide more space. Meds: Half cc of 10 mg Kenalog and half cc of lidocaine Needle: 25-gauge 5/8 inch needle Completed  without difficulty Aftercare instructions and Red flags advised. Advised to call if fevers/chills, erythema, induration, drainage, or  persistent bleeding.

## 2014-04-27 ENCOUNTER — Other Ambulatory Visit: Payer: Self-pay | Admitting: *Deleted

## 2014-04-27 ENCOUNTER — Other Ambulatory Visit: Payer: Self-pay | Admitting: Infectious Disease

## 2014-04-27 DIAGNOSIS — K861 Other chronic pancreatitis: Secondary | ICD-10-CM

## 2014-04-27 DIAGNOSIS — R1115 Cyclical vomiting syndrome unrelated to migraine: Secondary | ICD-10-CM

## 2014-04-27 DIAGNOSIS — A6000 Herpesviral infection of urogenital system, unspecified: Secondary | ICD-10-CM

## 2014-04-27 MED ORDER — PROCHLORPERAZINE MALEATE 10 MG PO TABS: ORAL_TABLET | ORAL | Status: DC

## 2014-04-27 MED ORDER — VALACYCLOVIR HCL 1 G PO TABS: ORAL_TABLET | ORAL | Status: DC

## 2014-06-01 ENCOUNTER — Telehealth: Payer: Self-pay | Admitting: *Deleted

## 2014-06-01 NOTE — Telephone Encounter (Signed)
Pt has vomited x 4 this morning.  Wondering about whether he should receive "fluids."  RN spoke with Dr. Linus Salmons.  Dr. Linus Salmons referred pt to the ED for evaluation and treatment.  Shared this information with the pt's wife.

## 2014-06-22 ENCOUNTER — Other Ambulatory Visit: Payer: Self-pay | Admitting: Infectious Disease

## 2014-07-18 ENCOUNTER — Other Ambulatory Visit: Payer: Medicaid Other

## 2014-07-18 ENCOUNTER — Telehealth: Payer: Self-pay

## 2014-07-18 DIAGNOSIS — T45515A Adverse effect of anticoagulants, initial encounter: Secondary | ICD-10-CM

## 2014-07-18 DIAGNOSIS — Z79899 Other long term (current) drug therapy: Secondary | ICD-10-CM

## 2014-07-18 DIAGNOSIS — Z113 Encounter for screening for infections with a predominantly sexual mode of transmission: Secondary | ICD-10-CM

## 2014-07-18 DIAGNOSIS — B20 Human immunodeficiency virus [HIV] disease: Secondary | ICD-10-CM

## 2014-07-18 DIAGNOSIS — Z21 Asymptomatic human immunodeficiency virus [HIV] infection status: Secondary | ICD-10-CM

## 2014-07-18 DIAGNOSIS — D6832 Hemorrhagic disorder due to extrinsic circulating anticoagulants: Secondary | ICD-10-CM

## 2014-07-18 LAB — CBC WITH DIFFERENTIAL/PLATELET
BASOS ABS: 0 10*3/uL (ref 0.0–0.1)
BASOS PCT: 0 % (ref 0–1)
Eosinophils Absolute: 0.1 10*3/uL (ref 0.0–0.7)
Eosinophils Relative: 2 % (ref 0–5)
HEMATOCRIT: 39.7 % (ref 39.0–52.0)
Hemoglobin: 13.9 g/dL (ref 13.0–17.0)
Lymphocytes Relative: 52 % — ABNORMAL HIGH (ref 12–46)
Lymphs Abs: 2.6 10*3/uL (ref 0.7–4.0)
MCH: 38.1 pg — ABNORMAL HIGH (ref 26.0–34.0)
MCHC: 35 g/dL (ref 30.0–36.0)
MCV: 108.8 fL — ABNORMAL HIGH (ref 78.0–100.0)
MPV: 10.7 fL (ref 8.6–12.4)
Monocytes Absolute: 0.4 10*3/uL (ref 0.1–1.0)
Monocytes Relative: 7 % (ref 3–12)
NEUTROS ABS: 2 10*3/uL (ref 1.7–7.7)
NEUTROS PCT: 39 % — AB (ref 43–77)
PLATELETS: 196 10*3/uL (ref 150–400)
RBC: 3.65 MIL/uL — ABNORMAL LOW (ref 4.22–5.81)
RDW: 13.4 % (ref 11.5–15.5)
WBC: 5 10*3/uL (ref 4.0–10.5)

## 2014-07-18 NOTE — Telephone Encounter (Signed)
I am very sorry about that, he has been diagnosed with this several times. When is his next appt with [place]?  It may help for [place] to look over his treatment. Flagyl is not supposed to be used after 2 episodes of c diff. From then on it is oral vancomycin. I would be happy to have him start 125mg  orally QID for next 2 weeks and see [place] in clinic

## 2014-07-18 NOTE — Telephone Encounter (Signed)
Patient wanted Dr Tommy Medal to know he had been diagnosed with C-diff and was treated with flagyl by his GI doctor but continues to have diarrhea.  Patient was advised to call his GI doctor an report his continued symptoms.  He also asked for PT/ INR for Dr Wandra Arthurs, RN .

## 2014-07-19 LAB — COMPLETE METABOLIC PANEL WITH GFR
ALK PHOS: 74 U/L (ref 39–117)
ALT: <8 U/L (ref 0–53)
AST: 13 U/L (ref 0–37)
Albumin: 4 g/dL (ref 3.5–5.2)
BUN: 20 mg/dL (ref 6–23)
CO2: 27 mEq/L (ref 19–32)
Calcium: 8.8 mg/dL (ref 8.4–10.5)
Chloride: 101 mEq/L (ref 96–112)
Creat: 1.4 mg/dL — ABNORMAL HIGH (ref 0.50–1.35)
GFR, Est African American: 69 mL/min
GFR, Est Non African American: 60 mL/min
Glucose, Bld: 82 mg/dL (ref 70–99)
POTASSIUM: 3.7 meq/L (ref 3.5–5.3)
SODIUM: 137 meq/L (ref 135–145)
TOTAL PROTEIN: 6.5 g/dL (ref 6.0–8.3)
Total Bilirubin: 0.7 mg/dL (ref 0.2–1.2)

## 2014-07-19 LAB — RPR

## 2014-07-19 LAB — PROTIME-INR
INR: 1.62 — ABNORMAL HIGH (ref <1.50)
Prothrombin Time: 19.2 seconds — ABNORMAL HIGH (ref 11.6–15.2)

## 2014-07-19 LAB — LIPID PANEL
CHOL/HDL RATIO: 3.7 ratio
CHOLESTEROL: 99 mg/dL (ref 0–200)
HDL: 27 mg/dL — AB (ref >=40)
LDL Cholesterol: 55 mg/dL (ref 0–99)
TRIGLYCERIDES: 87 mg/dL (ref <150)
VLDL: 17 mg/dL (ref 0–40)

## 2014-07-19 NOTE — Addendum Note (Signed)
Addended by: Dolan Amen D on: 07/19/2014 10:31 AM   Modules accepted: Orders

## 2014-07-19 NOTE — Addendum Note (Signed)
Addended by: Dolan Amen D on: 07/19/2014 10:29 AM   Modules accepted: Orders

## 2014-07-20 LAB — T-HELPER CELL (CD4) - (RCID CLINIC ONLY)
CD4 % Helper T Cell: 13 % — ABNORMAL LOW (ref 33–55)
CD4 T Cell Abs: 380 /uL — ABNORMAL LOW (ref 400–2700)

## 2014-07-21 LAB — HIV-1 RNA QUANT-NO REFLEX-BLD

## 2014-07-31 LAB — HIV-1 INTEGRASE GENOTYPE

## 2014-08-01 ENCOUNTER — Encounter: Payer: Self-pay | Admitting: Infectious Disease

## 2014-08-01 ENCOUNTER — Ambulatory Visit (INDEPENDENT_AMBULATORY_CARE_PROVIDER_SITE_OTHER): Payer: Medicaid Other | Admitting: Infectious Disease

## 2014-08-01 VITALS — BP 131/85 | HR 74 | Temp 98.8°F | Wt 124.0 lb

## 2014-08-01 DIAGNOSIS — A047 Enterocolitis due to Clostridium difficile: Secondary | ICD-10-CM | POA: Diagnosis not present

## 2014-08-01 DIAGNOSIS — Z954 Presence of other heart-valve replacement: Secondary | ICD-10-CM

## 2014-08-01 DIAGNOSIS — R197 Diarrhea, unspecified: Secondary | ICD-10-CM

## 2014-08-01 DIAGNOSIS — F339 Major depressive disorder, recurrent, unspecified: Secondary | ICD-10-CM

## 2014-08-01 DIAGNOSIS — A0471 Enterocolitis due to Clostridium difficile, recurrent: Secondary | ICD-10-CM

## 2014-08-01 DIAGNOSIS — G43A1 Cyclical vomiting, intractable: Secondary | ICD-10-CM

## 2014-08-01 DIAGNOSIS — Z952 Presence of prosthetic heart valve: Secondary | ICD-10-CM

## 2014-08-01 DIAGNOSIS — R1115 Cyclical vomiting syndrome unrelated to migraine: Secondary | ICD-10-CM

## 2014-08-01 DIAGNOSIS — B20 Human immunodeficiency virus [HIV] disease: Secondary | ICD-10-CM | POA: Diagnosis not present

## 2014-08-01 HISTORY — DX: Enterocolitis due to Clostridium difficile, recurrent: A04.71

## 2014-08-01 MED ORDER — VANCOMYCIN HCL 125 MG PO CAPS: 125.0000 mg | ORAL_CAPSULE | ORAL | Status: DC

## 2014-08-01 NOTE — Progress Notes (Signed)
Subjective:    Patient ID: Brandon [name], male    DOB: [birth year], 49 y.o.   MRN: [id number]  Diarrhea  Associated symptoms include abdominal pain, arthralgias and myalgias. Pertinent negatives include no chills, coughing, fever or vomiting. The symptoms are aggravated by stress. Risk factors include recent antibiotic use. The treatment provided mild relief.  Abdominal Pain Associated symptoms include arthralgias, diarrhea and myalgias. Pertinent negatives include no constipation, dysuria, fever, hematuria or vomiting.    Brandon [name] is a highly complicated man with history of  HIV/AIDS and Multi-DRUG RESISTANT virus formerly followed at [facility]. His HIV nadir was  22 when we first met him   Apparently he had at last time at [facility] been taken  off ARV and placed with hospice.  He had been on various complicated antiretroviral regimens in the past, with unfortunate GENOTYPIC resistance to all non-nucleoside reverse transcriptase inhibitors and all NRTIs, Resistance to all protease inhibitors with the exception of Prezista which had some activity genotypically,, Resistance to Isentress, and Elvitegravir and 100X reduced S to dolutegravir having both a 148H and 140S  and with Dual tropic virus.  02/12/2010 phenotype at [facility] showed:  RT: NRTI: ABC, DDI, D4T, AZT, TDF: resistant; 3TC, FTC: susceptible; NNRTI: EFV susceptible; RPV, NVP, ETR, DLV: Resistant; PI: pan-resistant  He had decided now to go back onto ARVS and is referred to [facility].  We  Have seen him and placed him on a  salvage regimen of Prezista 645m  Twice daily boosted with Norvir 1060mtwice daily, Tivicay twice daily, Combivir twice daily and once daily Viread.  And since then he has now an UNDETECTABLE VIRAL LOAD <20 FOR MORE THAN TWO YEARS  And CD4 now at 380   Lab Results  Component Value Date   HIV1RNAQUANT <20 07/18/2014   Lab Results  Component Value Date   CD4TABS 380* 07/18/2014   CD4TABS 300* 03/31/2014   CD4TABS 250* 01/26/2014    He has suffered from another bout of CDI and was given flagyl orally BID but still having loose stools 8x per day. He also had recurrence of nausea with vomiting but able to keep ARV down for at least 30-40 minutes  Review of Systems  Constitutional: Positive for appetite change and fatigue. Negative for fever, chills, diaphoresis, activity change and unexpected weight change.  HENT: Negative for congestion, rhinorrhea, sinus pressure, sneezing, sore throat and trouble swallowing.   Eyes: Negative for photophobia and visual disturbance.  Respiratory: Negative for cough, chest tightness, shortness of breath, wheezing and stridor.   Cardiovascular: Negative for chest pain, palpitations and leg swelling.  Gastrointestinal: Positive for abdominal pain and diarrhea. Negative for vomiting, constipation, abdominal distention and anal bleeding.  Genitourinary: Negative for dysuria, hematuria and flank pain.  Musculoskeletal: Positive for myalgias and arthralgias. Negative for back pain, joint swelling and gait problem.  Skin: Negative for color change, rash and wound.  Neurological: Positive for weakness. Negative for tremors.  Hematological: Negative for adenopathy. Does not bruise/bleed easily.  Psychiatric/Behavioral: Negative for behavioral problems, confusion, sleep disturbance, decreased concentration and agitation.       Objective:   Physical Exam  Constitutional: He is oriented to person, place, and time. No distress.  HENT:  Head: Normocephalic and atraumatic.  Mouth/Throat: Oropharynx is clear and moist. No oropharyngeal exudate.  Eyes: Conjunctivae and EOM are normal. No scleral icterus.  Neck: Normal range of motion. Neck supple. No JVD present.  Cardiovascular: Regular rhythm and normal heart sounds.  Pulmonary/Chest: Effort normal. No respiratory distress. He has no wheezes.  Abdominal: Soft. Bowel sounds are normal. He exhibits no distension. There is  tenderness in the right upper quadrant. There is no rigidity, no rebound and no guarding.  Musculoskeletal: He exhibits no edema or tenderness.  Lymphadenopathy:    He has no cervical adenopathy.  Neurological: He is alert and oriented to person, place, and time. He displays no atrophy. He exhibits normal muscle tone. Coordination normal.  Skin: Skin is warm and dry. He is not diaphoretic. No erythema. No pallor.  Psychiatric: He has a normal mood and affect. His behavior is normal. Judgment and thought content normal.          Assessment & Plan:    # 1HIV Disease, hx of AIDS,  Highly Resistant Virus. Perfect control and commended him again  Continue:  --Tivicay 34m BID --Prezista 6020mBID with  --Norvir 10041mID --Combivir 1 tablet BID --Viread q daily  HE SHOULD NOT TAKE ENSURE AT THE SAME TIME AS HIS TIVICAY!!!!  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 DC dapsone and PPI (which he is not taking presently)  Vancomycin pulse and taper and refer to Brandon [name] consideraiton of stool transplant   hx of AV replacement on anticoaguation: followed by PCP  Major Depression, chronic and Recurrent Depression: Continue SSRI and counseling here when needed   Nausea: anti emetics are only working with suppostory

## 2014-08-01 NOTE — Patient Instructions (Addendum)
Your vancomycin rx is for :  125 mg orally four times daily 14 days 125 mg orally twice daily for 7 days 125 mg orally once daily for 7 days 125 mg orally every other day for 7 days 125 mg orally every 3 days for 14 days  You need an APPT WITH DR Baxter Flattery FOR CONSIDERATON of stool transplant

## 2014-08-11 ENCOUNTER — Ambulatory Visit: Payer: Medicaid Other | Admitting: Internal Medicine

## 2014-08-18 ENCOUNTER — Ambulatory Visit (INDEPENDENT_AMBULATORY_CARE_PROVIDER_SITE_OTHER): Payer: Medicaid Other | Admitting: Internal Medicine

## 2014-08-18 ENCOUNTER — Encounter: Payer: Self-pay | Admitting: Internal Medicine

## 2014-08-18 VITALS — Temp 98.4°F | Wt 122.0 lb

## 2014-08-18 DIAGNOSIS — Z952 Presence of prosthetic heart valve: Secondary | ICD-10-CM

## 2014-08-18 DIAGNOSIS — A047 Enterocolitis due to Clostridium difficile: Secondary | ICD-10-CM | POA: Diagnosis not present

## 2014-08-18 DIAGNOSIS — B20 Human immunodeficiency virus [HIV] disease: Secondary | ICD-10-CM

## 2014-08-18 DIAGNOSIS — Z954 Presence of other heart-valve replacement: Secondary | ICD-10-CM | POA: Diagnosis not present

## 2014-08-18 DIAGNOSIS — A0472 Enterocolitis due to Clostridium difficile, not specified as recurrent: Secondary | ICD-10-CM

## 2014-08-18 NOTE — Progress Notes (Signed)
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   MRN: [id number]  HPI 49yo M with CAD s/p AVR on coumadin, chronic pancreatitis, with possible pancreatic insufficiency, function bowel disorder (IBS, cyclic vomiting), as well as currently well controlled HIV disease, CD 4 count of 380/VL<20, DLG-DRVr-TDF-combivir, salvage regimen. His care is split between [facility] and [place]. He states that he has suffered from recurrent bouts of c.difficile for hte past year. Documented in June 2015 in [place].  He was last seen by his main HIV doc, Hollie Salk, in late May, who started treating him for his latest relapse that was actually diagnosed at [facility].   He has been hospitalized for chronic diarrhea in the past that thought was due to chronic pancreatitis vs. recurrentc.difficile. He is now just finished 2 wk treatment course of vanco 125mg  QID x 14 days, and starting his taper. He states that he has 3-4 loose bowel movements per day, which is close to his baseline. He is to get re-established with GI at [facility] but has not had appointment established and he is interested in transferring his care to [facility], with his HIV care. He has tried numerous regimens in the past with metronidazole, vancomycin but has not been on taper before.  Current Outpatient Prescriptions on File Prior to Visit  Medication Sig Dispense Refill  . dolutegravir (TIVICAY) 50 MG tablet Take 1 tablet (50 mg total) by mouth 2 (two) times daily. 30 tablet 10  . ENSURE (ENSURE) Take 237 mLs by mouth 2 (two) times daily between meals. Provide a case per month: BMI<20 237 mL 6  . EPINEPHrine (EPI-PEN) 0.3 mg/0.3 mL DEVI Inject 0.3 mLs (0.3 mg total) into the muscle once. 1 Device 1  . escitalopram (LEXAPRO) 20 MG tablet TAKE 1 TABLET BY MOUTH   DAILY 30 tablet 3  . flunisolide (NASALIDE) 25 MCG/ACT (0.025%) SOLN Place 2 sprays into the nose at bedtime as needed. 1 Bottle 0  .  glycopyrrolate (ROBINUL) 1 MG tablet Take 2 tablets (2 mg total) by mouth 2 (two) times daily. 60 tablet 2  . lamiVUDine-zidovudine (COMBIVIR) 150-300 MG per tablet TAKE 1 TABLET BY MOUTH TWO TIMES DAILY. 60 tablet 11  . megestrol (MEGACE) 40 MG/ML suspension Take 800 mg by mouth daily.    . NORVIR 100 MG TABS tablet TAKE 1 TABLET BY MOUTH TWO   TIMES ADAY 60 tablet 3  . ondansetron (ZOFRAN-ODT) 8 MG disintegrating tablet Take 8 mg by mouth every 8 (eight) hours as needed for nausea or vomiting.    Marland Kitchen oxyCODONE (ROXICODONE INTENSOL) 20 MG/ML concentrated solution   0  . PREZISTA 600 MG tablet TAKE 1 TABLET (600 MG TOTAL) BY MOUTH TWO TIMES DAILY WITH A MEAL. 60 tablet 11  . prochlorperazine (COMPAZINE) 10 MG tablet TAKE 1 TABLET BY MOUTH EVERY 6 HOURS AS NEEDED FOR NAUSEA 120 tablet 3  . valACYclovir (VALTREX) 1000 MG tablet TAKE 1 TABLET (1,000 MG TOTAL) BY MOUTH DAILY. 30 tablet 5  . vancomycin (VANCOCIN) 125 MG capsule Take 1 capsule (125 mg total) by mouth as directed. 88 capsule 1  . VIREAD 300 MG tablet TAKE 1 TABLET (300 MG TOTAL) BY MOUTH DAILY. 30 tablet 11  . warfarin (COUMADIN) 1 MG tablet Take as directed with 5mg  tablet per [facility]    . warfarin (COUMADIN) 5 MG tablet Take 5-6 mg by mouth See admin instructions. Takes 6mg  on  Monday, Wednesday, Friday and Takes 5mg  on Tuesday, Thursday, Saturday, and Sunday    . warfarin (COUMADIN) 5 MG tablet Take 1 tablet daily and adjust as directed by Coumadin Clinic    . zolpidem (AMBIEN) 10 MG tablet Take 10 mg by mouth.     Current Facility-Administered Medications on File Prior to Visit  Medication Dose Route Frequency Provider Last Rate Last Dose  . 0.9 %  sodium chloride infusion   Intravenous Once Campbell Riches, MD       Active Ambulatory Problems    Diagnosis Date Noted  . Acute pancreatitis 02/09/2012  . Nausea & vomiting 02/09/2012  . Abdominal pain 02/09/2012  . Anemia 02/09/2012  . S/P AVR (aortic valve replacement)  02/09/2012  . Septic arthritis of knee 02/09/2012  . Warfarin-induced coagulopathy 02/09/2012  . AIDS 02/09/2012  . Chronic pancreatitis 02/20/2012  . Hypokalemia 02/20/2012  . Adjustment disorder with mixed anxiety and depressed mood 05/14/2012  . HTN (hypertension) 05/14/2012  . Genital herpes 05/14/2012  . Long term (current) use of anticoagulants 05/14/2012  . cerebral artey occ 05/14/2012  . Unspecified cerebral artery occlusion with cerebral infarction 05/14/2012  . Candida esophagitis 07/23/2012  . Intractable nausea and vomiting 07/23/2012  . Odynophagia 07/24/2012  . Persistent vomiting 07/24/2012  . Depression 03/17/2013  . Sinus congestion 03/17/2013  . Gastroenteritis 08/05/2013  . Diarrhea 08/23/2013  . HIV positive 08/23/2013  . Protein-calorie malnutrition, severe 08/24/2013  . Alleged drug diversion 01/31/2014  . Thumb pain 03/31/2014  . Left hand weakness 03/31/2014  . Opiate dependence 03/31/2014  . Major depression, recurrent, chronic 03/31/2014  . Cyclic vomiting syndrome 03/31/2014  . 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   Resolved Ambulatory Problems    Diagnosis Date Noted  . No Resolved Ambulatory Problems   Past Medical History  Diagnosis Date  . HIV (human immunodeficiency virus infection)   . Hypertension   . Stroke   . Pancreatitis   . Mechanical heart valve present   . Arthritis   . GERD (gastroesophageal reflux disease)   . Weight loss, unintentional   . Constipation       Review of Systems + 6-10 lb weight loss in the last 5 months. He has abd cramping with 3-4 loose stools per day. No blood in stool    Objective:   Physical Exam Temp(Src) 98.4 F (36.9 C) (Oral)  Wt 122 lb (55.339 kg) Physical Exam  Constitutional: He is oriented to person, place, and time. He appears well-developed and well-nourished. No distress.  HENT:  Mouth/Throat: Oropharynx is clear and moist. No oropharyngeal exudate.  Cardiovascular:  Normal rate, regular rhythm and normal heart sounds. Exam reveals no gallop and no friction rub.  No murmur heard.  Pulmonary/Chest: Effort normal and breath sounds normal. No respiratory distress. He has no wheezes.  Abdominal: Soft. Bowel sounds are normal. He exhibits no distension. There is no tenderness.  Lymphadenopathy:  He has no cervical adenopathy.  Neurological: He is alert and oriented to person, place, and time.  Skin: Skin is warm and dry. No rash noted. No erythema.  Psychiatric: He has a normal mood and affect. His behavior is normal.   Lab Results  Component Value Date   CD4TCELL 13* 07/18/2014   CD4TABS 380* 07/18/2014   Lab Results  Component Value Date   HIV1RNAQUANT <20 07/18/2014      Assessment & Plan:  Recurrent c.difficile = based upon number of relapses, he would be a candidate to try to  do fecal transplant. Since he does have underlying chronic pancreatitis with possible pancreatic insufficiency and IBS per records at [facility], there is likely not going to have much change in his overall bowel function. We will refer to Dr. Carlean Purl to evaluate.   We will continue on taper. He is to do : vanco 125mg  TID x 7 days vanco 125mg  BID x 7 days vanco 125mg  daily x 14 days. Until FMT  Will need to look at clinic funds to see if can subsidize cost of procurement of stool from [facility]

## 2014-08-29 ENCOUNTER — Telehealth: Payer: Self-pay | Admitting: *Deleted

## 2014-08-29 NOTE — Telephone Encounter (Signed)
Pt notified that [facility] sent refill.  Pt to pick up from his wife's home.

## 2014-08-31 ENCOUNTER — Ambulatory Visit (INDEPENDENT_AMBULATORY_CARE_PROVIDER_SITE_OTHER): Payer: Medicaid Other | Admitting: Internal Medicine

## 2014-08-31 ENCOUNTER — Encounter: Payer: Self-pay | Admitting: Internal Medicine

## 2014-08-31 VITALS — BP 103/74 | HR 103 | Temp 98.6°F | Ht 69.0 in | Wt 115.0 lb

## 2014-08-31 DIAGNOSIS — A047 Enterocolitis due to Clostridium difficile: Secondary | ICD-10-CM

## 2014-08-31 DIAGNOSIS — A0471 Enterocolitis due to Clostridium difficile, recurrent: Secondary | ICD-10-CM

## 2014-08-31 NOTE — Progress Notes (Signed)
Subjective:    Patient ID: Brandon [name], male    DOB: [birth year], 49 y.o.   MRN: [id number]  HPI  49yo M with CAD s/p AVR on coumadin, chronic pancreatitis, with possible pancreatic insufficiency, function bowel disorder (IBS, cyclic vomiting), as well as currently well controlled HIV disease, CD 4 count of 380/VL<20, DLG-DRVr-TDF-combivir, salvage regimen. His care is split between [facility] and [place]. He has had recurrent bouts of c.difficile for the past 2 syear. Documented in June 2015 in [place]. He was last seen by his main HIV doc, Hollie Salk, in late May, who started treating him for his latest relapse that was actually diagnosed at [facility] in April. He has been on vanco taper, finished 2 wk of 125mg  QID, then 2 wk of TID, now started on BID. Started to have worsening diarrhea since being tapered to tid- today 1st day of bid. Had 9 bm in 12hr. Noted yesterday. He does have occassional cramping, no blood instool. Significant weight loss of 5# in the last 2 weeks  Current Outpatient Prescriptions on File Prior to Visit  Medication Sig Dispense Refill  . dolutegravir (TIVICAY) 50 MG tablet Take 1 tablet (50 mg total) by mouth 2 (two) times daily. 30 tablet 10  . ENSURE (ENSURE) Take 237 mLs by mouth 2 (two) times daily between meals. Provide a case per month: BMI<20 237 mL 6  . EPINEPHrine (EPI-PEN) 0.3 mg/0.3 mL DEVI Inject 0.3 mLs (0.3 mg total) into the muscle once. 1 Device 1  . escitalopram (LEXAPRO) 20 MG tablet TAKE 1 TABLET BY MOUTH   DAILY 30 tablet 3  . flunisolide (NASALIDE) 25 MCG/ACT (0.025%) SOLN Place 2 sprays into the nose at bedtime as needed. 1 Bottle 0  . glycopyrrolate (ROBINUL) 1 MG tablet Take 2 tablets (2 mg total) by mouth 2 (two) times daily. 60 tablet 2  . lamiVUDine-zidovudine (COMBIVIR) 150-300 MG per tablet TAKE 1 TABLET BY MOUTH TWO TIMES DAILY. 60 tablet 11  . megestrol (MEGACE) 40 MG/ML suspension Take 800 mg by mouth daily.    . NORVIR 100 MG TABS tablet  TAKE 1 TABLET BY MOUTH TWO   TIMES ADAY 60 tablet 3  . ondansetron (ZOFRAN-ODT) 8 MG disintegrating tablet Take 8 mg by mouth every 8 (eight) hours as needed for nausea or vomiting.    Marland Kitchen oxyCODONE (ROXICODONE INTENSOL) 20 MG/ML concentrated solution   0  . PREZISTA 600 MG tablet TAKE 1 TABLET (600 MG TOTAL) BY MOUTH TWO TIMES DAILY WITH A MEAL. 60 tablet 11  . prochlorperazine (COMPAZINE) 10 MG tablet TAKE 1 TABLET BY MOUTH EVERY 6 HOURS AS NEEDED FOR NAUSEA 120 tablet 3  . valACYclovir (VALTREX) 1000 MG tablet TAKE 1 TABLET (1,000 MG TOTAL) BY MOUTH DAILY. 30 tablet 5  . vancomycin (VANCOCIN) 125 MG capsule Take 1 capsule (125 mg total) by mouth as directed. 88 capsule 1  . VIREAD 300 MG tablet TAKE 1 TABLET (300 MG TOTAL) BY MOUTH DAILY. 30 tablet 11  . warfarin (COUMADIN) 1 MG tablet Take as directed with 5mg  tablet per [facility]    . warfarin (COUMADIN) 5 MG tablet Take 5-6 mg by mouth See admin instructions. Takes 6mg  on Monday, Wednesday, Friday and Takes 5mg  on Tuesday, Thursday, Saturday, and Sunday    . warfarin (COUMADIN) 5 MG tablet Take 1 tablet daily and adjust as directed by [facility]    . zolpidem (AMBIEN) 10 MG tablet Take 10 mg by mouth.  Current Facility-Administered Medications on File Prior to Visit  Medication Dose Route Frequency Provider Last Rate Last Dose  . 0.9 %  sodium chloride infusion   Intravenous Once Campbell Riches, MD       Active Ambulatory Problems    Diagnosis Date Noted  . Acute pancreatitis 02/09/2012  . Nausea & vomiting 02/09/2012  . Abdominal pain 02/09/2012  . Anemia 02/09/2012  . S/P AVR (aortic valve replacement) 02/09/2012  . Septic arthritis of knee 02/09/2012  . Warfarin-induced coagulopathy 02/09/2012  . AIDS 02/09/2012  . Chronic pancreatitis 02/20/2012  . Hypokalemia 02/20/2012  . Adjustment disorder with mixed anxiety and depressed mood 05/14/2012  . HTN (hypertension) 05/14/2012  . Genital herpes 05/14/2012  . Long  term (current) use of anticoagulants 05/14/2012  . cerebral artey occ 05/14/2012  . Unspecified cerebral artery occlusion with cerebral infarction 05/14/2012  . Candida esophagitis 07/23/2012  . Intractable nausea and vomiting 07/23/2012  . Odynophagia 07/24/2012  . Persistent vomiting 07/24/2012  . Depression 03/17/2013  . Sinus congestion 03/17/2013  . Gastroenteritis 08/05/2013  . Diarrhea 08/23/2013  . HIV positive 08/23/2013  . Protein-calorie malnutrition, severe 08/24/2013  . Alleged drug diversion 01/31/2014  . Thumb pain 03/31/2014  . Left hand weakness 03/31/2014  . Opiate dependence 03/31/2014  . Major depression, recurrent, chronic 03/31/2014  . Cyclic vomiting syndrome 03/31/2014  . 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   Resolved Ambulatory Problems    Diagnosis Date Noted  . No Resolved Ambulatory Problems   Past Medical History  Diagnosis Date  . HIV (human immunodeficiency virus infection)   . Hypertension   . Stroke   . Pancreatitis   . Mechanical heart valve present   . Arthritis   . GERD (gastroesophageal reflux disease)   . Weight loss, unintentional   . Constipation       Review of Systems + diarrhea, weight loss, abdominal cramping. Mild nausea with kefir. 10 point ros is otherwise negative    Objective:   Physical Exam  BP 103/74 mmHg  Pulse 103  Temp(Src) 98.6 F (37 C) (Oral)  Ht 5\' 9"  (1.753 m)  Wt 115 lb (52.164 kg)  BMI 16.97 kg/m2 Physical Exam  Constitutional: He is oriented to person, place, and time. He appears gaunt. No distress.  HENT:  Mouth/Throat: Oropharynx is clear and moist. No oropharyngeal exudate.  Abdominal: Soft. Bowel sounds are normal. scaphoid He exhibits no distension. There is no tenderness.  Lymphadenopathy:  He has no cervical adenopathy.       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 Post infectious inflammation = Will retest cdifficile pcr to see if this is still  ongoing episode of c.difficile. For now, i have asked him to go back up to 125mg  qid of oral vanco x 2 wks. He may warrant colonoscopy for evaluation in general. If c.difficile is negative, then will do celiac work up, but will look at past records if that has been done for him in the past.  We spent 25 min discussing fecal transplant, experimental-not fda approved, informed consent,  ideal donor which he does not have anyone he knows that fits the criteria. I then mentioned the possibility of getting FMT from [facility] but the out of pocket costs are too cost prohibitive since he does not have $500 for cost of acquisition. Did discuss that there possibility of patient assistance application thru [facility].  Refer to gi with [facility]/pyrtle.  Spent 25 min with patient with greater than  50% in face to face time in discussing management of c.difficile and chronic diarrhea.

## 2014-09-03 LAB — CLOSTRIDIUM DIFFICILE BY PCR

## 2014-09-07 ENCOUNTER — Telehealth: Payer: Self-pay | Admitting: Licensed Clinical Social Worker

## 2014-09-07 NOTE — Telephone Encounter (Signed)
Patient called stating that he is still having diarrhea 5 to 6 daily, he switched to drinking propel instead of gatorade.

## 2014-09-14 ENCOUNTER — Encounter: Payer: Self-pay | Admitting: Internal Medicine

## 2014-09-14 ENCOUNTER — Ambulatory Visit (INDEPENDENT_AMBULATORY_CARE_PROVIDER_SITE_OTHER): Payer: Medicaid Other | Admitting: Internal Medicine

## 2014-09-14 VITALS — BP 116/82 | HR 83 | Temp 98.4°F | Wt 119.0 lb

## 2014-09-14 DIAGNOSIS — R1115 Cyclical vomiting syndrome unrelated to migraine: Secondary | ICD-10-CM

## 2014-09-14 DIAGNOSIS — G43A0 Cyclical vomiting, not intractable: Secondary | ICD-10-CM

## 2014-09-14 DIAGNOSIS — A0471 Enterocolitis due to Clostridium difficile, recurrent: Secondary | ICD-10-CM

## 2014-09-14 DIAGNOSIS — K589 Irritable bowel syndrome without diarrhea: Secondary | ICD-10-CM | POA: Diagnosis not present

## 2014-09-14 DIAGNOSIS — A047 Enterocolitis due to Clostridium difficile: Secondary | ICD-10-CM | POA: Diagnosis not present

## 2014-09-15 ENCOUNTER — Encounter (HOSPITAL_COMMUNITY): Payer: Self-pay | Admitting: *Deleted

## 2014-09-15 ENCOUNTER — Emergency Department (HOSPITAL_COMMUNITY)
Admission: EM | Admit: 2014-09-15 | Discharge: 2014-09-16 | Disposition: A | Discharge status: Home / Self Care | Payer: Medicaid Other | Attending: Emergency Medicine | Admitting: Emergency Medicine

## 2014-09-15 ENCOUNTER — Telehealth: Payer: Self-pay | Admitting: *Deleted

## 2014-09-15 DIAGNOSIS — R1115 Cyclical vomiting syndrome unrelated to migraine: Secondary | ICD-10-CM

## 2014-09-15 DIAGNOSIS — Z7901 Long term (current) use of anticoagulants: Secondary | ICD-10-CM | POA: Insufficient documentation

## 2014-09-15 DIAGNOSIS — Z720 Tobacco use: Secondary | ICD-10-CM | POA: Diagnosis not present

## 2014-09-15 DIAGNOSIS — Z862 Personal history of diseases of the blood and blood-forming organs and certain disorders involving the immune mechanism: Secondary | ICD-10-CM | POA: Diagnosis not present

## 2014-09-15 DIAGNOSIS — M1990 Unspecified osteoarthritis, unspecified site: Secondary | ICD-10-CM | POA: Insufficient documentation

## 2014-09-15 DIAGNOSIS — Z954 Presence of other heart-valve replacement: Secondary | ICD-10-CM | POA: Insufficient documentation

## 2014-09-15 DIAGNOSIS — K219 Gastro-esophageal reflux disease without esophagitis: Secondary | ICD-10-CM | POA: Insufficient documentation

## 2014-09-15 DIAGNOSIS — Z79899 Other long term (current) drug therapy: Secondary | ICD-10-CM | POA: Insufficient documentation

## 2014-09-15 DIAGNOSIS — Z8619 Personal history of other infectious and parasitic diseases: Secondary | ICD-10-CM | POA: Insufficient documentation

## 2014-09-15 DIAGNOSIS — I10 Essential (primary) hypertension: Secondary | ICD-10-CM | POA: Insufficient documentation

## 2014-09-15 DIAGNOSIS — Z21 Asymptomatic human immunodeficiency virus [HIV] infection status: Secondary | ICD-10-CM | POA: Insufficient documentation

## 2014-09-15 DIAGNOSIS — R109 Unspecified abdominal pain: Secondary | ICD-10-CM | POA: Diagnosis present

## 2014-09-15 DIAGNOSIS — Z793 Long term (current) use of hormonal contraceptives: Secondary | ICD-10-CM | POA: Diagnosis not present

## 2014-09-15 DIAGNOSIS — R112 Nausea with vomiting, unspecified: Secondary | ICD-10-CM | POA: Diagnosis not present

## 2014-09-15 DIAGNOSIS — Z8673 Personal history of transient ischemic attack (TIA), and cerebral infarction without residual deficits: Secondary | ICD-10-CM | POA: Diagnosis not present

## 2014-09-15 DIAGNOSIS — R1084 Generalized abdominal pain: Secondary | ICD-10-CM

## 2014-09-15 DIAGNOSIS — Z7951 Long term (current) use of inhaled steroids: Secondary | ICD-10-CM | POA: Insufficient documentation

## 2014-09-15 DIAGNOSIS — K5900 Constipation, unspecified: Secondary | ICD-10-CM | POA: Insufficient documentation

## 2014-09-15 DIAGNOSIS — K589 Irritable bowel syndrome without diarrhea: Secondary | ICD-10-CM

## 2014-09-15 LAB — COMPREHENSIVE METABOLIC PANEL
ALT: 21 U/L (ref 17–63)
AST: 26 U/L (ref 15–41)
Albumin: 4.8 g/dL (ref 3.5–5.0)
Alkaline Phosphatase: 86 U/L (ref 38–126)
Anion gap: 11 (ref 5–15)
BUN: 19 mg/dL (ref 6–20)
CO2: 23 mmol/L (ref 22–32)
Calcium: 10 mg/dL (ref 8.9–10.3)
Chloride: 105 mmol/L (ref 101–111)
Creatinine, Ser: 1.4 mg/dL — ABNORMAL HIGH (ref 0.61–1.24)
GFR calc Af Amer: >60 mL/min (ref >60)
GFR calc non Af Amer: 58 mL/min — ABNORMAL LOW (ref >60)
Glucose, Bld: 125 mg/dL — ABNORMAL HIGH (ref 65–99)
Potassium: 4.5 mmol/L (ref 3.5–5.1)
SODIUM: 139 mmol/L (ref 135–145)
TOTAL PROTEIN: 8.6 g/dL — AB (ref 6.5–8.1)
Total Bilirubin: 0.8 mg/dL (ref 0.3–1.2)

## 2014-09-15 LAB — CBC WITH DIFFERENTIAL/PLATELET
Basophils Absolute: 0 10*3/uL (ref 0.0–0.1)
Basophils Relative: 0 % (ref 0–1)
EOS ABS: 0 10*3/uL (ref 0.0–0.7)
Eosinophils Relative: 0 % (ref 0–5)
HEMATOCRIT: 45.1 % (ref 39.0–52.0)
Hemoglobin: 16 g/dL (ref 13.0–17.0)
LYMPHS ABS: 1.4 10*3/uL (ref 0.7–4.0)
Lymphocytes Relative: 14 % (ref 12–46)
MCH: 38 pg — AB (ref 26.0–34.0)
MCHC: 35.5 g/dL (ref 30.0–36.0)
MCV: 107.1 fL — ABNORMAL HIGH (ref 78.0–100.0)
MONOS PCT: 5 % (ref 3–12)
Monocytes Absolute: 0.5 10*3/uL (ref 0.1–1.0)
NEUTROS ABS: 8.1 10*3/uL — AB (ref 1.7–7.7)
Neutrophils Relative %: 81 % — ABNORMAL HIGH (ref 43–77)
Platelets: 240 10*3/uL (ref 150–400)
RBC: 4.21 MIL/uL — ABNORMAL LOW (ref 4.22–5.81)
RDW: 13.2 % (ref 11.5–15.5)
WBC: 10 10*3/uL (ref 4.0–10.5)

## 2014-09-15 LAB — I-STAT CG4 LACTIC ACID, ED: LACTIC ACID, VENOUS: 3.92 mmol/L — AB (ref 0.5–2.0)

## 2014-09-15 LAB — LIPASE, BLOOD: LIPASE: 26 U/L (ref 22–51)

## 2014-09-15 LAB — PROTIME-INR
INR: 3.47 — AB (ref 0.00–1.49)
Prothrombin Time: 34.2 seconds — ABNORMAL HIGH (ref 11.6–15.2)

## 2014-09-15 MED ORDER — SODIUM CHLORIDE 0.9 % IV BOLUS (SEPSIS)
2000.0000 mL | Freq: Once | INTRAVENOUS | Status: AC
Start: 2014-09-15 — End: 2014-09-15
  Administered 2014-09-15: 2000 mL via INTRAVENOUS

## 2014-09-15 MED ORDER — ONDANSETRON 8 MG PO TBDP: 8.0000 mg | ORAL_TABLET | Freq: Three times a day (TID) | ORAL | Status: DC | PRN

## 2014-09-15 MED ORDER — MORPHINE SULFATE 4 MG/ML IJ SOLN
4.0000 mg | Freq: Once | INTRAMUSCULAR | Status: AC
  Administered 2014-09-15: 4 mg via INTRAVENOUS
  Filled 2014-09-15: qty 1

## 2014-09-15 MED ORDER — LORAZEPAM 2 MG/ML IJ SOLN
0.5000 mg | INTRAMUSCULAR | Status: DC | PRN
  Administered 2014-09-15: 0.5 mg via INTRAVENOUS
  Filled 2014-09-15: qty 1

## 2014-09-15 MED ORDER — DICYCLOMINE HCL 10 MG/ML IM SOLN
20.0000 mg | Freq: Once | INTRAMUSCULAR | Status: AC
  Administered 2014-09-15: 20 mg via INTRAMUSCULAR
  Filled 2014-09-15: qty 2

## 2014-09-15 MED ORDER — KETOROLAC TROMETHAMINE 30 MG/ML IJ SOLN
30.0000 mg | Freq: Once | INTRAMUSCULAR | Status: AC
  Administered 2014-09-15: 30 mg via INTRAVENOUS
  Filled 2014-09-15: qty 1

## 2014-09-15 MED ORDER — DICYCLOMINE HCL 20 MG PO TABS: 20.0000 mg | ORAL_TABLET | Freq: Three times a day (TID) | ORAL | Status: DC

## 2014-09-15 MED ORDER — ONDANSETRON HCL 4 MG/2ML IJ SOLN
4.0000 mg | Freq: Once | INTRAMUSCULAR | Status: AC
  Administered 2014-09-15: 4 mg via INTRAVENOUS
  Filled 2014-09-15: qty 2

## 2014-09-15 MED ORDER — SODIUM CHLORIDE 0.9 % IV BOLUS (SEPSIS)
1000.0000 mL | Freq: Once | INTRAVENOUS | Status: AC
Start: 2014-09-15 — End: 2014-09-16
  Administered 2014-09-15: 1000 mL via INTRAVENOUS

## 2014-09-15 NOTE — Telephone Encounter (Addendum)
Spoke with Dr. Baxter Flattery.  She called in 2 prescriptions for the patient (Bentyl and Zofran).  RN relayed the information to the patient.  He states he needs fluids. He does not want to go to the ED, he wants to come to clinic. RN advised that we are closed from 12:30 - 1:30.  RN asked patient to try the medication, he states he doesn't call for delivery, that [facility] will bring it by "in a day or 2."  RN advised him to contact the pharmacy and ask for it to be delivered today or to have someone pick it up.  Pt stated he would try, but really wants to come here for fluids.

## 2014-09-15 NOTE — Telephone Encounter (Signed)
Per Dr. Tommy Medal, RN advised patient to go to urgent care for fluids.  Patient stated he wasn't able to get fluids there previously, had to go to the ED.  He states he doesn't feel like waiting 2 hours to be checked in at the ED.  RN apologized, stated that while our office can try to accommodate his needs at times, we are unable to do so today.  RN contacted [facility], they did not receive a request for delivery of medications today.  RN asked if they could be sent, pharmacist said she would try and if not today, they would be there tomorrow morning.  RN relayed the information to the patient.

## 2014-09-15 NOTE — ED Notes (Signed)
At 5 am today pt experiencing diffuse abdominal pian, emesis and diarrhea. Pt is still taking vanc for c-diff. Pt states just told by Dr today he is clear of C-diff.  Pt took phenergan suppositories and SL zofran with no relief.

## 2014-09-15 NOTE — ED Provider Notes (Signed)
CSN: [id number]     Arrival date & time 09/15/14  1623 History   First MD Initiated Contact with Patient 09/15/14 2018     Chief Complaint  Patient presents with  . Abdominal Pain  . Emesis    (Consider location/radiation/quality/duration/timing/severity/associated sxs/prior Treatment) HPI Comments: 49 year old male with a history of HIV, last CD4 count 380, CAD s/p AVR on Coumadin, chronic pancreatitis with questionable pancreatic insufficiency, and bowel disorder (IBS vs cyclic vomiting) presents to the emergency department for further evaluation of abdominal pain. Patient states that he was having a bowel movement at 5 AM today when he developed cramping, stabbing, gripping pain diffusely over his abdomen. Pain has been constant since this time with mild worsening. Patient reports that he has had similar symptoms intermittently over the last 16 years. Patient reports associated emesis of a green color which has been free of blood. Patient has had TNTC episodes of emesis. He reports that movement worsens his abdominal pain and nothing improves his pain. He is currently on a 125mg  vancomycin regimen for chronic C. difficile. Patient was supposed to taper from QID vancomycin to a TID regimen today. He has not been able to take any of his vancomycin since his symptoms began. Abdominal surgical history significant for cholecystectomy. Patient denies any associated chest pain, shortness of breath, dysuria, hematuria, melanoma or hematochezia, or hematemesis.  Patient is a 49 y.o. male presenting with abdominal pain and vomiting. The history is provided by the patient. No language interpreter was used.  Abdominal Pain Associated symptoms: vomiting   Associated symptoms: no chest pain, no diarrhea, no dysuria, no fever, no hematuria and no shortness of breath   Emesis Associated symptoms: abdominal pain   Associated symptoms: no diarrhea     Past Medical History  Diagnosis Date  . HIV (human  immunodeficiency virus infection)   . Hypertension   . Stroke   . Pancreatitis   . Mechanical heart valve present   . Arthritis   . Anemia   . GERD (gastroesophageal reflux disease)   . Abdominal pain   . Weight loss, unintentional   . Constipation   . Nausea & vomiting   . Diarrhea   . 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   Past Surgical History  Procedure Laterality Date  . Cardiac surgery    . Knee surgery    . Cholecystectomy  02/12/2012    Procedure: LAPAROSCOPIC CHOLECYSTECTOMY;  Surgeon: Stark Klein, MD;  Location: [facility];  Service: General;  Laterality: N/A;  . Esophagogastroduodenoscopy N/A 07/24/2012    Procedure: ESOPHAGOGASTRODUODENOSCOPY (EGD);  Surgeon: Beryle Beams, MD;  Location[facility] ENDOSCOPY;  Service: Endoscopy;  Laterality: N/A;  . Aortic valve replacement     Family History  Problem Relation Age of Onset  . Hypertension Father   . Cancer - Prostate Father   . Cancer Father     stomach  . Hypertension Sister   . Diabetes Maternal Aunt   . Cancer - Other Cousin   . Parkinson's disease Paternal Aunt    History  Substance Use Topics  . Smoking status: Current Every Day Smoker -- 0.50 packs/day for 20 years    Types: Cigars, Cigarettes  . Smokeless tobacco: Never Used     Comment: cutting back  . Alcohol Use: Yes     Comment: 12oz beer/ per week     Review of Systems  Constitutional: Negative for fever.  Respiratory: Negative for shortness of breath.   Cardiovascular: Negative for chest pain.  Gastrointestinal: Positive for vomiting and abdominal pain. Negative for diarrhea and blood in stool.  Genitourinary: Negative for dysuria and hematuria.  Neurological: Negative for syncope.  All other systems reviewed and are negative.   Allergies  Bactrim; Bee venom; Sulfa antibiotics; Truvada; Lidoderm; Raltegravir; Ceftriaxone; and Sulfamethoxazole  Home Medications   Prior to Admission medications   Medication Sig Start Date End  Date Taking? Authorizing Provider  dolutegravir (TIVICAY) 50 MG tablet Take 1 tablet (50 mg total) by mouth 2 (two) times daily. 01/04/14  Yes Truman Hayward, MD  escitalopram (LEXAPRO) 20 MG tablet TAKE 1 TABLET BY MOUTH   DAILY 06/23/14  Yes Truman Hayward, MD  glycopyrrolate (ROBINUL) 1 MG tablet Take 2 tablets (2 mg total) by mouth 2 (two) times daily. 08/25/13  Yes Estela Leonie Green, MD  lamiVUDine-zidovudine (COMBIVIR) 150-300 MG per tablet TAKE 1 TABLET BY MOUTH TWO TIMES DAILY. 01/04/14  Yes Truman Hayward, MD  NORVIR 100 MG TABS tablet TAKE 1 TABLET BY MOUTH TWO   TIMES ADAY 06/23/14  Yes Truman Hayward, MD  zolpidem (AMBIEN) 10 MG tablet Take 10 mg by mouth at bedtime.  03/05/12  Yes Historical Provider, MD  dicyclomine (BENTYL) 20 MG tablet Take 1 tablet (20 mg total) by mouth 4 (four) times daily -  before meals and at bedtime. 09/16/14   Antonietta Breach, PA-C  ENSURE (ENSURE) Take 237 mLs by mouth 2 (two) times daily between meals. Provide a case per month: BMI<20 07/19/13   Truman Hayward, MD  EPINEPHrine (EPI-PEN) 0.3 mg/0.3 mL DEVI Inject 0.3 mLs (0.3 mg total) into the muscle once. 08/20/12   Truman Hayward, MD  flunisolide (NASALIDE) 25 MCG/ACT (0.025%) SOLN Place 2 sprays into the nose at bedtime as needed. 03/17/13   Truman Hayward, MD  megestrol (MEGACE) 40 MG/ML suspension Take 800 mg by mouth daily.    Historical Provider, MD  ondansetron (ZOFRAN ODT) 8 MG disintegrating tablet Take 1 tablet (8 mg total) by mouth every 8 (eight) hours as needed for nausea or vomiting. 09/16/14   Antonietta Breach, PA-C  oxyCODONE (ROXICODONE INTENSOL) 20 MG/ML concentrated solution  02/10/14   Historical Provider, MD  PREZISTA 600 MG tablet TAKE 1 TABLET (600 MG TOTAL) BY MOUTH TWO TIMES DAILY WITH A MEAL. 01/04/14   Truman Hayward, MD  prochlorperazine (COMPAZINE) 10 MG tablet TAKE 1 TABLET BY MOUTH EVERY 6 HOURS AS NEEDED FOR NAUSEA 06/23/14   Truman Hayward, MD   valACYclovir (VALTREX) 1000 MG tablet TAKE 1 TABLET (1,000 MG TOTAL) BY MOUTH DAILY. 04/27/14   Truman Hayward, MD  vancomycin (VANCOCIN) 125 MG capsule Take 1 capsule (125 mg total) by mouth as directed. 08/01/14   Truman Hayward, MD  VIREAD 300 MG tablet TAKE 1 TABLET (300 MG TOTAL) BY MOUTH DAILY. 01/04/14   Truman Hayward, MD  warfarin (COUMADIN) 1 MG tablet Take as directed with 5mg  tablet per [facility] 03/24/14   Historical Provider, MD  warfarin (COUMADIN) 5 MG tablet Take 5-6 mg by mouth See admin instructions. Takes 6mg  on Monday, Wednesday, Friday and Takes 5mg  on Tuesday, Thursday, Saturday, and Sunday    Historical Provider, MD  warfarin (COUMADIN) 5 MG tablet Take 1 tablet daily and adjust as directed by [facility] 03/24/14 03/24/15  Historical Provider, MD   BP 132/78 mmHg  Pulse 73  Temp(Src) 98.4 F (36.9  C) (Oral)  Resp 18  SpO2 100%   Physical Exam  Constitutional: He is oriented to person, place, and time. He appears well-developed and well-nourished. No distress.  Patients rolling around in the bed, he appears uncomfortable.  HENT:  Head: Normocephalic and atraumatic.  Eyes: Conjunctivae and EOM are normal. No scleral icterus.  Neck: Normal range of motion.  Cardiovascular: Normal rate, regular rhythm and intact distal pulses.   Pulmonary/Chest: Effort normal. No respiratory distress.  Respirations even and unlabored  Abdominal: Soft. He exhibits no distension. There is tenderness. There is no rebound and no guarding.  Patient very hasn't and to allow me to palpate his abdomen. He is very rigid in the bed during the exam. It is hard to have the patient relax. He appears to be diffusely tender. No areas of focal tenderness appreciated. No masses or involuntary guarding.  Musculoskeletal: Normal range of motion.  Neurological: He is alert and oriented to person, place, and time. He exhibits normal muscle tone. Coordination normal.  GCS 15. Patient  moving all extremities  Skin: Skin is warm and dry. No rash noted. He is not diaphoretic. No erythema. No pallor.  Psychiatric: He has a normal mood and affect. His behavior is normal.  Nursing note and vitals reviewed.   ED Course  Procedures (including critical care time) Labs Review Labs Reviewed  CBC WITH DIFFERENTIAL/PLATELET - Abnormal; Notable for the following:    RBC 4.21 (*)    MCV 107.1 (*)    MCH 38.0 (*)    Neutrophils Relative % 81 (*)    Neutro Abs 8.1 (*)    All other components within normal limits  COMPREHENSIVE METABOLIC PANEL - Abnormal; Notable for the following:    Glucose, Bld 125 (*)    Creatinine, Ser 1.40 (*)    Total Protein 8.6 (*)    GFR calc non Af Amer 58 (*)    All other components within normal limits  PROTIME-INR - Abnormal; Notable for the following:    Prothrombin Time 34.2 (*)    INR 3.47 (*)    All other components within normal limits  I-STAT CG4 LACTIC ACID, ED - Abnormal; Notable for the following:    Lactic Acid, Venous 3.92 (*)    All other components within normal limits  I-STAT CG4 LACTIC ACID, ED - Abnormal; Notable for the following:    Lactic Acid, Venous 2.15 (*)    All other components within normal limits  LIPASE, BLOOD    Imaging Review No results found.   EKG Interpretation None      2200 - Patient resting comfortably. States pain down to 6/10 from 10/10.  2350 - Patient still resting. Abdominal reexamination stable. He has drank 1 cup of Sprite without emesis. States his abdomen still aches, but has not worsened since tx in ED initiated.  MDM   Final diagnoses:  Generalized abdominal pain  Non-intractable vomiting with nausea, vomiting of unspecified type    49 year old male presents to the emergency Department with complaints of abdominal pain and vomiting. Patient reports that he has had similar symptoms over the past 16 years and pain today feels consistent with prior exacerbations of his known abdominal  pain and vomiting. Patient thought to have IBS vs cyclic vomiting syndrome. His labs today show no leukocytosis or electrolyte imbalance. Kidney function stable compared to 2 months ago. PT/INR therapeutic given hx of AVR.  Lactic acid level today is elevated, but improved with IV fluids. Patient has also  had improvement in his abdominal pain and vomiting with Bentyl, Toradol, and Zofran. He has been able to tolerate 2 glasses of Sprite without further emesis. Given history of similar symptoms and stable abdominal reexamination, do not believe further emergent workup or imaging is indicated. Patient stable for outpatient management and he is comfortable with discharge. Will discharge with Bentyl and Zofran as well as instruction of follow-up with his primary care provider regarding his visit to the ED today. Return precautions given and patient discharged in good condition; VSS.   Filed Vitals:   09/15/14 2200 09/15/14 2300 09/15/14 2308 09/16/14 0039  BP: 151/75 132/78 132/78   Pulse: 77 92 86 73  Temp:      TempSrc:      Resp:   18   SpO2: 100% 99% 100% 100%     Antonietta Breach, PA-C 09/16/14 2440  Ernestina Patches, MD 09/17/14 1027

## 2014-09-15 NOTE — Telephone Encounter (Signed)
Patient is taking vancomycin, has developed concerning symptoms this morning at 5am.  Shivers, abdominal cramping and unable to eat or drink, is "throwing up green stuff." Patient started the oral vancomycin last month.  Please advise.

## 2014-09-16 ENCOUNTER — Other Ambulatory Visit: Payer: Self-pay | Admitting: Infectious Disease

## 2014-09-16 LAB — I-STAT CG4 LACTIC ACID, ED: LACTIC ACID, VENOUS: 2.15 mmol/L — AB (ref 0.5–2.0)

## 2014-09-16 MED ORDER — ONDANSETRON 8 MG PO TBDP: 8.0000 mg | ORAL_TABLET | Freq: Three times a day (TID) | ORAL | Status: DC | PRN

## 2014-09-16 MED ORDER — DICYCLOMINE HCL 20 MG PO TABS: 20.0000 mg | ORAL_TABLET | Freq: Three times a day (TID) | ORAL | Status: DC

## 2014-09-16 NOTE — Progress Notes (Signed)
Patient ID: Brandon [name], male   DOB: 02/03/1966, 49 y.o.   MRN: [id number]       Patient ID: Brandon [name], male   DOB: 01-16-1966, 49 y.o.   MRN: [id number]  HPI 49yo M with CAD s/p AVR on coumadin, chronic pancreatitis, with possible pancreatic insufficiency, function bowel disorder (IBS, cyclic vomiting), as well as currently well controlled HIV disease, CD 4 count of 380/VL<20, DLG-DRVr-TDF-combivir, salvage regimen. His care is split between [facility] and [facility]. He has had recurrent bouts of c.difficile for the past 2 syear. Documented in June 2015 in [place]. He was last seen by his main HIV doc, Hollie Salk, in late May, who started treating him for his latest relapse that was actually diagnosed at [facility] in April. He has been on vanco taper x 2.    He is on vancomycin 125mg  TID presently as part of his taper. We did ask him to bring a stool sample at the start of his latest presumed relapse. His cdifficile PCR test was negative (though was on treatment). He reports having 7 BM loose stools yesterday. He has some associated cramping. Often has urgency but does not necessarily have a BM everytime he feels like he is about to have one. No blood in stool. Good appetite.  Outpatient Encounter Prescriptions as of 09/14/2014  Medication Sig  . dolutegravir (TIVICAY) 50 MG tablet Take 1 tablet (50 mg total) by mouth 2 (two) times daily.  Marland Kitchen ENSURE (ENSURE) Take 237 mLs by mouth 2 (two) times daily between meals. Provide a case per month: BMI<20  . EPINEPHrine (EPI-PEN) 0.3 mg/0.3 mL DEVI Inject 0.3 mLs (0.3 mg total) into the muscle once.  . escitalopram (LEXAPRO) 20 MG tablet TAKE 1 TABLET BY MOUTH   DAILY  . flunisolide (NASALIDE) 25 MCG/ACT (0.025%) SOLN Place 2 sprays into the nose at bedtime as needed.  Marland Kitchen glycopyrrolate (ROBINUL) 1 MG tablet Take 2 tablets (2 mg total) by mouth 2 (two) times daily.  Marland Kitchen lamiVUDine-zidovudine (COMBIVIR) 150-300 MG per tablet TAKE 1 TABLET BY MOUTH TWO TIMES  DAILY.  . megestrol (MEGACE) 40 MG/ML suspension Take 800 mg by mouth daily.  . NORVIR 100 MG TABS tablet TAKE 1 TABLET BY MOUTH TWO   TIMES ADAY  . oxyCODONE (ROXICODONE INTENSOL) 20 MG/ML concentrated solution   . PREZISTA 600 MG tablet TAKE 1 TABLET (600 MG TOTAL) BY MOUTH TWO TIMES DAILY WITH A MEAL.  Marland Kitchen prochlorperazine (COMPAZINE) 10 MG tablet TAKE 1 TABLET BY MOUTH EVERY 6 HOURS AS NEEDED FOR NAUSEA  . valACYclovir (VALTREX) 1000 MG tablet TAKE 1 TABLET (1,000 MG TOTAL) BY MOUTH DAILY.  . vancomycin (VANCOCIN) 125 MG capsule Take 1 capsule (125 mg total) by mouth as directed.  Marland Kitchen VIREAD 300 MG tablet TAKE 1 TABLET (300 MG TOTAL) BY MOUTH DAILY.  Marland Kitchen warfarin (COUMADIN) 1 MG tablet Take as directed with 5mg  tablet per [facility]  . warfarin (COUMADIN) 5 MG tablet Take 5-6 mg by mouth See admin instructions. Takes 6mg  on Monday, Wednesday, Friday and Takes 5mg  on Tuesday, Thursday, Saturday, and Sunday  . warfarin (COUMADIN) 5 MG tablet Take 1 tablet daily and adjust as directed by [facility]  . zolpidem (AMBIEN) 10 MG tablet Take 10 mg by mouth at bedtime.   . [DISCONTINUED] ondansetron (ZOFRAN-ODT) 8 MG disintegrating tablet Take 8 mg by mouth every 8 (eight) hours as needed for nausea or vomiting.  . [DISCONTINUED] dicyclomine (BENTYL) 20 MG tablet Take 1  tablet (20 mg total) by mouth 4 (four) times daily -  before meals and at bedtime.  . [DISCONTINUED] ondansetron (ZOFRAN ODT) 8 MG disintegrating tablet Take 1 tablet (8 mg total) by mouth every 8 (eight) hours as needed for nausea or vomiting.   Facility-Administered Encounter Medications as of 09/14/2014  Medication  . 0.9 %  sodium chloride infusion     Patient Active Problem List   Diagnosis Date Noted  . 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 Thumb pain 03/31/2014  . Left hand weakness 03/31/2014  . Opiate dependence 03/31/2014  . Major depression, recurrent, chronic 03/31/2014  . Cyclic vomiting  syndrome 03/31/2014  . Alleged drug diversion 01/31/2014  . Protein-calorie malnutrition, severe 08/24/2013  . Diarrhea 08/23/2013  . HIV positive 08/23/2013  . Gastroenteritis 08/05/2013  . Depression 03/17/2013  . Sinus congestion 03/17/2013  . Odynophagia 07/24/2012  . Persistent vomiting 07/24/2012  . Candida esophagitis 07/23/2012  . Intractable nausea and vomiting 07/23/2012  . Adjustment disorder with mixed anxiety and depressed mood 05/14/2012  . HTN (hypertension) 05/14/2012  . Genital herpes 05/14/2012  . Long term (current) use of anticoagulants 05/14/2012  . cerebral artey occ 05/14/2012  . Unspecified cerebral artery occlusion with cerebral infarction 05/14/2012  . Chronic pancreatitis 02/20/2012  . Hypokalemia 02/20/2012  . Acute pancreatitis 02/09/2012  . Nausea & vomiting 02/09/2012  . Abdominal pain 02/09/2012  . Anemia 02/09/2012  . S/P AVR (aortic valve replacement) 02/09/2012  . Septic arthritis of knee 02/09/2012  . Warfarin-induced coagulopathy 02/09/2012  . AIDS 02/09/2012     There are no preventive care reminders to display for this patient.   Review of Systems  Physical Exam   BP 116/82 mmHg  Pulse 83  Temp(Src) 98.4 F (36.9 C) (Oral)  Wt 119 lb (53.978 kg) Physical Exam  Constitutional: He is oriented to person, place, and time. He appears well-developed and well-nourished. No distress.  HENT: facial wasting. Mouth/Throat: Oropharynx is clear and moist. No oropharyngeal exudate.  Abdominal: Soft. Bowel sounds are hyperactive. He exhibits no distension. There is no tenderness.  Psychiatric: He has a normal mood and affect. His behavior is normal.    Lab Results  Component Value Date   CD4TCELL 13* 07/18/2014   Lab Results  Component Value Date   CD4TABS 380* 07/18/2014   CD4TABS 300* 03/31/2014   CD4TABS 250* 01/26/2014   Lab Results  Component Value Date   HIV1RNAQUANT <20 07/18/2014   Lab Results  Component Value Date    HEPBSAB NONREACTIVE 05/14/2012   No results found for: RPR  CBC Lab Results  Component Value Date   WBC 10.0 09/15/2014   RBC 4.21* 09/15/2014   HGB 16.0 09/15/2014   HCT 45.1 09/15/2014   PLT 240 09/15/2014   MCV 107.1* 09/15/2014   MCH 38.0* 09/15/2014   MCHC 35.5 09/15/2014   RDW 13.2 09/15/2014   LYMPHSABS 1.4 09/15/2014   MONOABS 0.5 09/15/2014   EOSABS 0.0 09/15/2014   BASOSABS 0.0 09/15/2014   BMET Lab Results  Component Value Date   NA 139 09/15/2014   K 4.5 09/15/2014   CL 105 09/15/2014   CO2 23 09/15/2014   GLUCOSE 125* 09/15/2014   BUN 19 09/15/2014   CREATININE 1.40* 09/15/2014   CALCIUM 10.0 09/15/2014   GFRNONAA 58* 09/15/2014   GFRAA >60 09/15/2014   cdiff July 2016: NEGATIVE  Assessment and Plan  49yo M with HIV disease,  with CAD s/p AVR on coumadin, chronic pancreatitis, with  possible pancreatic insufficiency, function bowel disorder (IBS, cyclic vomiting), and history of recurrent pancreatitis, last testing positive for C.difficile in April at [facility]. On prolonged vancomycin taper. Recent c.difficile is negative.  - will give him bentyl and zofran to help with symptoms - will ask him to stop his oral vancomycin since I think this appears to be more consistent with either post infectious IBS vs. Functional bowel disorder  - would like to have him establish care with local GI to help with management of IBS and chronic pancreatitis, whether he needs to be established on creon and other medications to help his symptoms.  - if he does have relapse in c.difficile, we will do fecal transplant, and apply for donation through [facility] [employer]. At this time, his symptoms do not appear to be due to active c.difficile infection.

## 2014-09-16 NOTE — Discharge Instructions (Signed)
Recommend that you take Bentyl and Zofran as prescribed for symptoms. Continue taking vancomycin as prescribed by your primary doctor. Drink fluids to prevent dehydration. Follow-up with your primary doctor for recheck of symptoms. Also recommend follow-up with a gastroenterologist, as previously recommended by your primary doctor.  Abdominal Pain Many things can cause abdominal pain. Usually, abdominal pain is not caused by a disease and will improve without treatment. It can often be observed and treated at home. Your health care provider will do a physical exam and possibly order blood tests and X-rays to help determine the seriousness of your pain. However, in many cases, more time must pass before a clear cause of the pain can be found. Before that point, your health care provider may not know if you need more testing or further treatment. HOME CARE INSTRUCTIONS  Monitor your abdominal pain for any changes. The following actions may help to alleviate any discomfort you are experiencing:  Only take over-the-counter or prescription medicines as directed by your health care provider.  Do not take laxatives unless directed to do so by your health care provider.  Try a clear liquid diet (broth, tea, or water) as directed by your health care provider. Slowly move to a bland diet as tolerated. SEEK MEDICAL CARE IF:  You have unexplained abdominal pain.  You have abdominal pain associated with nausea or diarrhea.  You have pain when you urinate or have a bowel movement.  You experience abdominal pain that wakes you in the night.  You have abdominal pain that is worsened or improved by eating food.  You have abdominal pain that is worsened with eating fatty foods.  You have a fever. SEEK IMMEDIATE MEDICAL CARE IF:   Your pain does not go away within 2 hours.  You keep throwing up (vomiting).  Your pain is felt only in portions of the abdomen, such as the right side or the left lower  portion of the abdomen.  You pass bloody or black tarry stools. MAKE SURE YOU:  Understand these instructions.   Will watch your condition.   Will get help right away if you are not doing well or get worse.  Document Released: 12/05/2004 Document Revised: 03/02/2013 Document Reviewed: 11/04/2012 [facility] Patient Information 2015 [place], [place]. This information is not intended to replace advice given to you by your health care provider. Make sure you discuss any questions you have with your health care provider.  Diet and Irritable Bowel Syndrome  No cure has been found for irritable bowel syndrome (IBS). Many options are available to treat the symptoms. Your caregiver will give you the best treatments available for your symptoms. He or she will also encourage you to manage stress and to make changes to your diet. You need to work with your caregiver and Registered Dietician to find the best combination of medicine, diet, counseling, and support to control your symptoms. The following are some diet suggestions. FOODS THAT MAKE IBS WORSE  Fatty foods, such as [place] fries.  Milk products, such as cheese or ice cream.  Chocolate.  Alcohol.  Caffeine (found in coffee and some sodas).  Carbonated drinks, such as soda. If certain foods cause symptoms, you should eat less of them or stop eating them. FOOD JOURNAL   Keep a journal of the foods that seem to cause distress. Write down:  What you are eating during the day and when.  What problems you are having after eating.  When the symptoms occur in relation to  your meals.  What foods always make you feel badly.  Take your notes with you to your caregiver to see if you should stop eating certain foods. FOODS THAT MAKE IBS BETTER Fiber reduces IBS symptoms, especially constipation, because it makes stools soft, bulky, and easier to pass. Fiber is found in bran, bread, cereal, beans, fruit, and vegetables. Examples of foods with  fiber include:  Apples.  Peaches.  Pears.  Berries.  Figs.  Broccoli, raw.  Cabbage.  Carrots.  Raw peas.  Kidney beans.  Lima beans.  Whole-grain bread.  Whole-grain cereal. Add foods with fiber to your diet a little at a time. This will let your body get used to them. Too much fiber at once might cause gas and swelling of your abdomen. This can trigger symptoms in a person with IBS. Caregivers usually recommend a diet with enough fiber to produce soft, painless bowel movements. High fiber diets may cause gas and bloating. However, these symptoms often go away within a few weeks, as your body adjusts. In many cases, dietary fiber may lessen IBS symptoms, particularly constipation. However, it may not help pain or diarrhea. High fiber diets keep the colon mildly enlarged (distended) with the added fiber. This may help prevent spasms in the colon. Some forms of fiber also keep water in the stool, thereby preventing hard stools that are difficult to pass.  Besides telling you to eat more foods with fiber, your caregiver may also tell you to get more fiber by taking a fiber pill or drinking water mixed with a special high fiber powder. An example of this is a natural fiber laxative containing psyllium seed.  TIPS  Large meals can cause cramping and diarrhea in people with IBS. If this happens to you, try eating 4 or 5 small meals a day, or try eating less at each of your usual 3 meals. It may also help if your meals are low in fat and high in carbohydrates. Examples of carbohydrates are pasta, rice, whole-grain breads and cereals, fruits, and vegetables.  If dairy products cause your symptoms to flare up, you can try eating less of those foods. You might be able to handle yogurt better than other dairy products, because it contains bacteria that helps with digestion. Dairy products are an important source of calcium and other nutrients. If you need to avoid dairy products, be sure to  talk with a Registered Dietitian about getting these nutrients through other food sources.  Drink enough water and fluids to keep your urine clear or pale yellow. This is important, especially if you have diarrhea. FOR MORE INFORMATION  International Foundation for Functional Gastrointestinal Disorders: www.iffgd.org  National Digestive Diseases Information Clearinghouse: digestive.AmenCredit.is Document Released: 05/18/2003 Document Revised: 05/20/2011 Document Reviewed: 05/28/2013 [facility] Patient Information 2015 [place], [place]. This information is not intended to replace advice given to you by your health care provider. Make sure you discuss any questions you have with your health care provider.

## 2014-09-16 NOTE — ED Notes (Signed)
Pt verbalizes understanding of d/c instructions and denies any further needs at this time. 

## 2014-09-16 NOTE — Telephone Encounter (Signed)
I will call the patient but i sent him some rx for anti nausea and bentyl for post infectious ibs since recent cdiff testing is negative

## 2014-09-27 ENCOUNTER — Encounter: Payer: Self-pay | Admitting: Internal Medicine

## 2014-09-27 ENCOUNTER — Telehealth: Payer: Self-pay | Admitting: *Deleted

## 2014-09-27 NOTE — Telephone Encounter (Signed)
Patient's wife called reporting patient's bright red blood from rectum.  She states she thinks this has been going on about 1 week, but he didn't tell her until last night when the toilet bowl was "full of red blood."  Per wife, patient went to [facility] coumadin [facility], states his hemoglobin was fine. He has been referred to [facility], but they have been unable to contact him.  RN advised patient's wife to call [facility] to follow up on the referral.   Patient called back. He has [employer] and all referrals must come from his PCP.  Patient states his PCP is a Dr. Linward Headland at [facility].  He will call his PCP and ask for the referral to GI.  He states he has been seen by GI at [facility] in the past; RN advised him that [facility] would want those records to review.  Patient is to contact [facility] to request the records be sent to the GI group his PCP refers him to. Landis Gandy, RN

## 2014-09-27 NOTE — Telephone Encounter (Signed)
This is way too complicated

## 2014-09-29 ENCOUNTER — Encounter: Payer: Self-pay | Admitting: Physician Assistant

## 2014-09-29 ENCOUNTER — Telehealth: Payer: Self-pay

## 2014-09-29 NOTE — Telephone Encounter (Signed)
-----   Message from Gatha Mayer, MD sent at 09/28/2014  5:02 PM EDT ----- Regarding: needs appt Dr. Baxter Flattery asked if Dr. Hilarie Fredrickson or I would see this patient =- can we get him an appointment I do not think it is urgent ----- Message -----    From: Carlyle Basques, MD    Sent: 09/01/2014  10:44 AM      To: Gatha Mayer, MD, Jerene Bears, MD  Can you guys see him anytime soon in next 2-4 wk. Somewhat complicated past med hx in addition to GI issues. My partner, kees, referred him to me for his recurrent cdi, has been on prolonged vanco taper. He has had recurrences intermittently for the past 2 years. Last positive test in April (at [facility]). 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   I think he would be a good candidate for FMT but also needs fine tuning.  I am applying to [employer] patient assistance to get him some free fecal donation.

## 2014-10-14 ENCOUNTER — Other Ambulatory Visit (INDEPENDENT_AMBULATORY_CARE_PROVIDER_SITE_OTHER): Payer: Medicaid Other

## 2014-10-14 ENCOUNTER — Encounter: Payer: Self-pay | Admitting: Physician Assistant

## 2014-10-14 ENCOUNTER — Ambulatory Visit (INDEPENDENT_AMBULATORY_CARE_PROVIDER_SITE_OTHER): Payer: Medicaid Other | Admitting: Physician Assistant

## 2014-10-14 ENCOUNTER — Other Ambulatory Visit: Payer: Self-pay | Admitting: Infectious Disease

## 2014-10-14 VITALS — BP 108/78 | HR 82 | Ht 68.0 in | Wt 121.0 lb

## 2014-10-14 DIAGNOSIS — R1310 Dysphagia, unspecified: Secondary | ICD-10-CM

## 2014-10-14 DIAGNOSIS — K644 Residual hemorrhoidal skin tags: Secondary | ICD-10-CM

## 2014-10-14 DIAGNOSIS — K219 Gastro-esophageal reflux disease without esophagitis: Secondary | ICD-10-CM

## 2014-10-14 DIAGNOSIS — K648 Other hemorrhoids: Secondary | ICD-10-CM

## 2014-10-14 DIAGNOSIS — R110 Nausea: Secondary | ICD-10-CM

## 2014-10-14 DIAGNOSIS — R197 Diarrhea, unspecified: Secondary | ICD-10-CM

## 2014-10-14 LAB — COMPREHENSIVE METABOLIC PANEL
ALT: 9 U/L (ref 0–53)
AST: 13 U/L (ref 0–37)
Albumin: 4.1 g/dL (ref 3.5–5.2)
Alkaline Phosphatase: 65 U/L (ref 39–117)
BILIRUBIN TOTAL: 0.3 mg/dL (ref 0.2–1.2)
BUN: 17 mg/dL (ref 6–23)
CHLORIDE: 108 meq/L (ref 96–112)
CO2: 28 mEq/L (ref 19–32)
Calcium: 9.1 mg/dL (ref 8.4–10.5)
Creatinine, Ser: 1.28 mg/dL (ref 0.40–1.50)
GFR: 77.41 mL/min (ref >60.00)
Glucose, Bld: 80 mg/dL (ref 70–99)
POTASSIUM: 4.4 meq/L (ref 3.5–5.1)
SODIUM: 139 meq/L (ref 135–145)
TOTAL PROTEIN: 7 g/dL (ref 6.0–8.3)

## 2014-10-14 LAB — CBC WITH DIFFERENTIAL/PLATELET
Basophils Absolute: 0 10*3/uL (ref 0.0–0.1)
Basophils Relative: 0.7 % (ref 0.0–3.0)
Eosinophils Absolute: 0.1 10*3/uL (ref 0.0–0.7)
Eosinophils Relative: 2.3 % (ref 0.0–5.0)
HCT: 40.2 % (ref 39.0–52.0)
Hemoglobin: 13.9 g/dL (ref 13.0–17.0)
Lymphocytes Relative: 44 % (ref 12.0–46.0)
Lymphs Abs: 2.9 10*3/uL (ref 0.7–4.0)
MCHC: 34.5 g/dL (ref 30.0–36.0)
MCV: 111.8 fl — ABNORMAL HIGH (ref 78.0–100.0)
MONO ABS: 0.5 10*3/uL (ref 0.1–1.0)
MONOS PCT: 7 % (ref 3.0–12.0)
NEUTROS ABS: 3 10*3/uL (ref 1.4–7.7)
Neutrophils Relative %: 46 % (ref 43.0–77.0)
PLATELETS: 205 10*3/uL (ref 150.0–400.0)
RBC: 3.6 Mil/uL — ABNORMAL LOW (ref 4.22–5.81)
RDW: 14.6 % (ref 11.5–15.5)
WBC: 6.5 10*3/uL (ref 4.0–10.5)

## 2014-10-14 LAB — LIPASE: Lipase: 55 U/L (ref 11.0–59.0)

## 2014-10-14 LAB — IGA: IgA: 230 mg/dL (ref 68–378)

## 2014-10-14 MED ORDER — RANITIDINE HCL 300 MG PO TABS: 300.0000 mg | ORAL_TABLET | Freq: Every day | ORAL | Status: DC

## 2014-10-14 NOTE — Patient Instructions (Addendum)
You have been scheduled for a Barium Esophogram at [facility] (1st floor of the hospital) on  10/21/2014 at 9:30am. Please arrive 15 minutes prior to your appointment for registration. Make certain not to have anything to eat or drink 6 hours prior to your test. If you need to reschedule for any reason, please contact radiology at [phone] to do so. __________________________________________________________________ A barium swallow is an examination that concentrates on views of the esophagus. This tends to be a double contrast exam (barium and two liquids which, when combined, create a gas to distend the wall of the oesophagus) or single contrast (non-ionic iodine based). The study is usually tailored to your symptoms so a good history is essential. Attention is paid during the study to the form, structure and configuration of the esophagus, looking for functional disorders (such as aspiration, dysphagia, achalasia, motility and reflux) EXAMINATION You may be asked to change into a gown, depending on the type of swallow being performed. A radiologist and radiographer will perform the procedure. The radiologist will advise you of the type of contrast selected for your procedure and direct you during the exam. You will be asked to stand, sit or lie in several different positions and to hold a small amount of fluid in your mouth before being asked to swallow while the imaging is performed .In some instances you may be asked to swallow barium coated marshmallows to assess the motility of a solid food bolus. The exam can be recorded as a digital or video fluoroscopy procedure. POST PROCEDURE It will take 1-2 days for the barium to pass through your system. To facilitate this, it is important, unless otherwise directed, to increase your fluids for the next 24-48hrs and to resume your normal diet.  This test typically takes about 30 minutes to  perform. __________________________________________________________________________________ Go to the basement for labs  Hemorrhoids Hemorrhoids are swollen veins around the rectum or anus. There are two types of hemorrhoids:   Internal hemorrhoids. These occur in the veins just inside the rectum. They may poke through to the outside and become irritated and painful.  External hemorrhoids. These occur in the veins outside the anus and can be felt as a painful swelling or hard lump near the anus. CAUSES  Pregnancy.   Obesity.   Constipation or diarrhea.   Straining to have a bowel movement.   Sitting for long periods on the toilet.  Heavy lifting or other activity that caused you to strain.  Anal intercourse. SYMPTOMS   Pain.   Anal itching or irritation.   Rectal bleeding.   Fecal leakage.   Anal swelling.   One or more lumps around the anus.  DIAGNOSIS  Your caregiver may be able to diagnose hemorrhoids by visual examination. Other examinations or tests that may be performed include:   Examination of the rectal area with a gloved hand (digital rectal exam).   Examination of anal canal using a small tube (scope).   A blood test if you have lost a significant amount of blood.  A test to look inside the colon (sigmoidoscopy or colonoscopy). TREATMENT Most hemorrhoids can be treated at home. However, if symptoms do not seem to be getting better or if you have a lot of rectal bleeding, your caregiver may perform a procedure to help make the hemorrhoids get smaller or remove them completely. Possible treatments include:   Placing a rubber band at the base of the hemorrhoid to cut off the circulation (rubber band ligation).  Injecting a chemical to shrink the hemorrhoid (sclerotherapy).   Using a tool to burn the hemorrhoid (infrared light therapy).   Surgically removing the hemorrhoid (hemorrhoidectomy).   Stapling the hemorrhoid to block blood  flow to the tissue (hemorrhoid stapling).  HOME CARE INSTRUCTIONS   Eat foods with fiber, such as whole grains, beans, nuts, fruits, and vegetables. Ask your doctor about taking products with added fiber in them (fibersupplements).  Increase fluid intake. Drink enough water and fluids to keep your urine clear or pale yellow.   Exercise regularly.   Go to the bathroom when you have the urge to have a bowel movement. Do not wait.   Avoid straining to have bowel movements.   Keep the anal area dry and clean. Use wet toilet paper or moist towelettes after a bowel movement.   Medicated creams and suppositories may be used or applied as directed.   Only take over-the-counter or prescription medicines as directed by your caregiver.   Take warm sitz baths for 15-20 minutes, 3-4 times a day to ease pain and discomfort.   Place ice packs on the hemorrhoids if they are tender and swollen. Using ice packs between sitz baths may be helpful.   Put ice in a plastic bag.   Place a towel between your skin and the bag.   Leave the ice on for 15-20 minutes, 3-4 times a day.   Do not use a donut-shaped pillow or sit on the toilet for long periods. This increases blood pooling and pain.  SEEK MEDICAL CARE IF:  You have increasing pain and swelling that is not controlled by treatment or medicine.  You have uncontrolled bleeding.  You have difficulty or you are unable to have a bowel movement.  You have pain or inflammation outside the area of the hemorrhoids. MAKE SURE YOU:  Understand these instructions.  Will watch your condition.  Will get help right away if you are not doing well or get worse. Document Released: 02/23/2000 Document Revised: 02/12/2012 Document Reviewed: 12/31/2011 [facility] Patient Information 2015 [place], [place]. This information is not intended to replace advice given to you by your health care provider. Make sure you discuss any questions you have with  your health care provider.   Food Choices for Gastroesophageal Reflux Disease When you have gastroesophageal reflux disease (GERD), the foods you eat and your eating habits are very important. Choosing the right foods can help ease the discomfort of GERD. WHAT GENERAL GUIDELINES DO I NEED TO FOLLOW?  Choose fruits, vegetables, whole grains, low-fat dairy products, and low-fat meat, fish, and poultry.  Limit fats such as oils, salad dressings, butter, nuts, and avocado.  Keep a food diary to identify foods that cause symptoms.  Avoid foods that cause reflux. These may be different for different people.  Eat frequent small meals instead of three large meals each day.  Eat your meals slowly, in a relaxed setting.  Limit fried foods.  Cook foods using methods other than frying.  Avoid drinking alcohol.  Avoid drinking large amounts of liquids with your meals.  Avoid bending over or lying down until 2-3 hours after eating. WHAT FOODS ARE NOT RECOMMENDED? The following are some foods and drinks that may worsen your symptoms: Vegetables Tomatoes. Tomato juice. Tomato and spaghetti sauce. Chili peppers. Onion and garlic. Horseradish. Fruits Oranges, grapefruit, and lemon (fruit and juice). Meats High-fat meats, fish, and poultry. This includes hot dogs, ribs, ham, sausage, salami, and bacon. Dairy Whole milk and chocolate milk.  Sour cream. Cream. Butter. Ice cream. Cream cheese.  Beverages Coffee and tea, with or without caffeine. Carbonated beverages or energy drinks. Condiments Hot sauce. Barbecue sauce.  Sweets/Desserts Chocolate and cocoa. Donuts. Peppermint and spearmint. Fats and Oils High-fat foods, including [place] fries and potato chips. Other Vinegar. Strong spices, such as black pepper, white pepper, red pepper, cayenne, curry powder, cloves, ginger, and chili powder. The items listed above may not be a complete list of foods and beverages to avoid. Contact your  dietitian for more information. Document Released: 02/25/2005 Document Revised: 03/02/2013 Document Reviewed: 12/30/2012 [facility] Patient Information 2015 [place], [place]. This information is not intended to replace advice given to you by your health care provider. Make sure you discuss any questions you have with your health care provider.  Continue Bentyl Our follow up appointment is scheduled on 11/18/2014 at 2:45pm

## 2014-10-16 ENCOUNTER — Encounter: Payer: Self-pay | Admitting: Physician Assistant

## 2014-10-16 NOTE — Progress Notes (Signed)
Agree with initial assessment and plans 

## 2014-10-16 NOTE — Progress Notes (Signed)
Patient ID: Brandon [name], male   DOB: 06/03/1965, 49 y.o.   MRN: [id number]     HPI:  Brandon [name] is a 49 y.o.   male  referred by Tommy Medal, Lavell Islam, MD for evaluation of diarrhea and dysphagia. [name] has a rather complicated medical history including chronic pancreatitis with possible pancreatic insufficiency, functional bowel disorder (IBS and cyclic vomiting) multidrug-resistant HIV disease currently well-controlled, and CAD status post aVR for which she is on Coumadin. His care is split between [facility]. He reports that he has had diarrhea for 10 or 11 years duration. He is currently having 6-8 bowel movements daily with nocturnal stooling. His diarrhea is associated with nausea and vomiting. 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 He had about in June 2015. He was diagnosed with C. difficile again at [facility] in April 2016 and had a vancomycin taper in May by infectious disease it [facility]. He was evaluated by infectious disease on 09/16/2014 at which time he was currently on vancomycin 125 mg 3 times a day as part of his taper. He states he finished his taper about a week and a half ago and his diarrhea has never changed. He reports scant amounts of blood on the toilet tissue intermittently and complains of intermittent rectal discomfort, itching, and burning. He has tried pancreatic enzymes in the past but states they have not helped his diarrhea.  He also reports that he has been having difficulty swallowing liquids and solids for almost a year. He reports a history of recurrent H. pylori which has been treated several times but he is not sure if he is ever had a stool antigen or urea breath test to check for eradication. He had an EGD in May 2014 while an inpatient at [facility] by Dr. Benson Norway. There were no gross esophageal abnormalities noted. 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 Esophageal biopsies showed  benign squamous mucosa with no evidence of dysplasia or malignancy. Fortunately the patient's never followed up after that visit.   Past Medical History  Diagnosis Date  . HIV (human immunodeficiency virus infection)   . Hypertension   . Stroke   . Pancreatitis   . Mechanical heart valve present   . Arthritis   . Anemia   . GERD (gastroesophageal reflux disease)   . Abdominal pain   . Weight loss, unintentional   . Constipation   . Nausea & vomiting   . Diarrhea   . 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 GI bleed   . IBS (irritable bowel syndrome)   . Interstitial cystitis   . Infectious colitis   . Gallstones     Past Surgical History  Procedure Laterality Date  . Cardiac surgery    . Knee surgery    . Cholecystectomy  02/12/2012    Procedure: LAPAROSCOPIC CHOLECYSTECTOMY;  Surgeon: Stark Klein, MD;  Location: [place];  Service: General;  Laterality: N/A;  . Esophagogastroduodenoscopy N/A 07/24/2012    Procedure: ESOPHAGOGASTRODUODENOSCOPY (EGD);  Surgeon: Beryle Beams, MD;  Location: [facility] ENDOSCOPY;  Service: Endoscopy;  Laterality: N/A;  . Aortic valve replacement     Family History  Problem Relation Age of Onset  . Hypertension Father   . Cancer - Prostate Father   . Cancer Father     stomach  . Hypertension Sister   . Diabetes Maternal Aunt   . Cancer - Other Cousin   .  Parkinson's disease Paternal Aunt    History  Substance Use Topics  . Smoking status: Current Every Day Smoker -- 0.50 packs/day for 20 years    Types: Cigars, Cigarettes  . Smokeless tobacco: Never Used     Comment: cutting back  . Alcohol Use: 0.0 oz/week    0 Standard drinks or equivalent per week     Comment: 12oz beer/ per week    Current Outpatient Prescriptions  Medication Sig Dispense Refill  . dicyclomine (BENTYL) 20 MG tablet Take 1 tablet (20 mg total) by mouth 4 (four) times daily -  before meals and at bedtime. 30 tablet 0  . dolutegravir (TIVICAY) 50 MG tablet  Take 1 tablet (50 mg total) by mouth 2 (two) times daily. 30 tablet 10  . ENSURE (ENSURE) Take 237 mLs by mouth 2 (two) times daily between meals. Provide a case per month: BMI<20 237 mL 6  . EPINEPHrine (EPI-PEN) 0.3 mg/0.3 mL DEVI Inject 0.3 mLs (0.3 mg total) into the muscle once. 1 Device 1  . escitalopram (LEXAPRO) 20 MG tablet TAKE 1 TABLET BY MOUTH   DAILY 30 tablet 3  . flunisolide (NASALIDE) 25 MCG/ACT (0.025%) SOLN Place 2 sprays into the nose at bedtime as needed. 1 Bottle 0  . lamiVUDine-zidovudine (COMBIVIR) 150-300 MG per tablet TAKE 1 TABLET BY MOUTH TWO TIMES DAILY. 60 tablet 11  . megestrol (MEGACE) 40 MG/ML suspension Take 800 mg by mouth daily.    . NORVIR 100 MG TABS tablet TAKE 1 TABLET BY MOUTH TWO   TIMES ADAY 60 tablet 3  . ondansetron (ZOFRAN ODT) 8 MG disintegrating tablet Take 1 tablet (8 mg total) by mouth every 8 (eight) hours as needed for nausea or vomiting. 30 tablet 0  . PREZISTA 600 MG tablet TAKE 1 TABLET (600 MG TOTAL) BY MOUTH TWO TIMES DAILY WITH A MEAL. 60 tablet 11  . prochlorperazine (COMPAZINE) 10 MG tablet TAKE 1 TABLET BY MOUTH EVERY 6 HOURS AS NEEDED FOR NAUSEA 120 tablet 3  . VIREAD 300 MG tablet TAKE 1 TABLET (300 MG TOTAL) BY MOUTH DAILY. 30 tablet 11  . warfarin (COUMADIN) 1 MG tablet Take as directed with 5mg  tablet per [facility]    . warfarin (COUMADIN) 5 MG tablet Take 5-6 mg by mouth See admin instructions. Takes 6mg  on Monday, Wednesday, Friday and Takes 5mg  on Tuesday, Thursday, Saturday, and Sunday    . warfarin (COUMADIN) 5 MG tablet Take 1 tablet daily and adjust as directed by [facility]    . zolpidem (AMBIEN) 10 MG tablet Take 10 mg by mouth at bedtime.     . ranitidine (ZANTAC) 300 MG tablet Take 1 tablet (300 mg total) by mouth at bedtime. 30 tablet 6  . valACYclovir (VALTREX) 1000 MG tablet TAKE 1 TABLET (1,000 MG TOTAL) BY MOUTH DAILY. 30 tablet 5   No current facility-administered medications for this visit.    Facility-Administered Medications Ordered in Other Visits  Medication Dose Route Frequency Provider Last Rate Last Dose  . 0.9 %  sodium chloride infusion   Intravenous Once Campbell Riches, MD       Allergies  Allergen Reactions  . Bactrim [Sulfamethoxazole-Trimethoprim]   . Bee Venom Anaphylaxis  . Sulfa Antibiotics Anaphylaxis  . Truvada [Emtricitabine-Tenofovir Df] Anaphylaxis and Rash    Takes plain tenofovir at home  . Lidoderm [Lidocaine] Other (See Comments)    Reaction unknown  . Raltegravir     resistance  . Ceftriaxone Rash  .  Sulfamethoxazole Itching, Other (See Comments) and Rash    Other reaction(s): Hypotension (ALLERGY/intolerance)     Review of Systems: Gen: Denies any fever, chills, sweats, anorexia, fatigue, weakness, malaise, weight loss, and sleep disorder CV: Denies chest pain, angina, palpitations, syncope, orthopnea, PND, peripheral edema, and claudication. Resp: Denies dyspnea at rest, dyspnea with exercise, cough, sputum, wheezing, coughing up blood, and pleurisy. GI: Denies vomiting blood, jaundice, and fecal incontinence. Has dysphagia to solids and liquids. Has frequent diarrhea. GU : Denies urinary burning, blood in urine, urinary frequency, urinary hesitancy, nocturnal urination, and urinary incontinence. MS: Denies joint pain, limitation of movement, and swelling, stiffness, low back pain, extremity pain. Denies muscle weakness, cramps, atrophy.  Derm: Denies rash, itching, dry skin, hives, moles, warts, or unhealing ulcers.  Psych: Denies depression, anxiety, memory loss, suicidal ideation, hallucinations, paranoia, and confusion. Neuro:  Denies any headaches, dizziness, paresthesias. Endo:  Denies any problems with DM, thyroid, adrenal function    Prior Endoscopies:   See history of present illness.  Physical Exam: BP 108/78 mmHg  Pulse 82  Ht 5\' 8"  (1.727 m)  Wt 121 lb (54.885 kg)  BMI 18.40 kg/m2 Constitutional:  Pleasant,well-developed, African-American male in no acute distress. HEENT: Normocephalic and atraumatic. Conjunctivae are normal. No scleral icterus. Oropharynx clear and moist. No oropharyngeal exudate noted Neck supple.  Cardiovascular: Normal rate, regular rhythm.  Pulmonary/chest: Effort normal and breath sounds normal. No wheezing, rales or rhonchi. Abdominal: Soft, nondistended, nontender. Bowel sounds active throughout. There are no masses palpable. No hepatomegaly Rectal: External hemorrhoids. Small, nonthrombosed easily reducible internal hemorrhoid. Extremities: no edema Lymphadenopathy: No cervical adenopathy noted. Neurological: Alert and oriented to person place and time. Skin: Skin is warm and dry. No rashes noted. Psychiatric: Normal mood and affect. Behavior is normal.  ASSESSMENT AND PLAN: 49 year old male with HIV disease, chronic pancreatitis with possible pancreatic insufficiency, CAD status post AVR on Coumadin status post several bouts of recurrent C. difficile, with ongoing diarrhea.? If he may still have C. difficile or if this may represent a postinfectious IBS. Will obtain a repeat stool for C. difficile PCR as well as a stool lactoferrin, TTG, IgA, lipase, and comprehensive metabolic panel and CBC. If the above are nonrevealing patient may be a candidate for colonoscopy to evaluate for possible microscopic colitis. If he does have recurrent C. Difficile? If he may be a candidate for a stool transplant area will review with attending. In the meantime, for his hemorrhoids he will be given a trial of AnaMantle rectal cream to apply rectally twice daily for 10 days. He will continue to use Bentyl as needed.  With regards to his dysphagia, this may be due to some poorly controlled reflux. He will be given a trial of ranitidine 300 mg one by mouth daily at bedtime. An antireflux regimen has been reviewed. He will be scheduled for a barium swallow with tablet to evaluate for  possible stricture, esophageal wall thickening, dysmotility etc.  He will return for reevaluation in 3 weeks, at which time we will review all his results and the possible need for endoscopy or colonoscopy.    Bobbiejo Ishikawa, Deloris Ping 10/16/2014, 6:22 PM  CC: Tommy Medal, Lavell Islam, MD

## 2014-10-17 LAB — TISSUE TRANSGLUTAMINASE, IGA: TISSUE TRANSGLUTAMINASE AB, IGA: 1 U/mL (ref <4)

## 2014-10-18 ENCOUNTER — Other Ambulatory Visit: Payer: Medicaid Other

## 2014-10-18 DIAGNOSIS — K219 Gastro-esophageal reflux disease without esophagitis: Secondary | ICD-10-CM

## 2014-10-18 DIAGNOSIS — R197 Diarrhea, unspecified: Secondary | ICD-10-CM

## 2014-10-18 DIAGNOSIS — R110 Nausea: Secondary | ICD-10-CM

## 2014-10-18 DIAGNOSIS — K648 Other hemorrhoids: Secondary | ICD-10-CM

## 2014-10-18 DIAGNOSIS — R1310 Dysphagia, unspecified: Secondary | ICD-10-CM

## 2014-10-19 LAB — FECAL LACTOFERRIN, QUANT: Lactoferrin: NEGATIVE

## 2014-10-19 LAB — CLOSTRIDIUM DIFFICILE BY PCR: Toxigenic C. Difficile by PCR: NOT DETECTED

## 2014-10-20 ENCOUNTER — Other Ambulatory Visit: Payer: Self-pay | Admitting: *Deleted

## 2014-10-20 MED ORDER — SACCHAROMYCES BOULARDII 250 MG PO CAPS: ORAL_CAPSULE | ORAL | Status: DC

## 2014-10-20 MED ORDER — PANCRELIPASE (LIP-PROT-AMYL) 36000-114000 UNITS PO CPEP: ORAL_CAPSULE | ORAL | Status: DC

## 2014-10-21 ENCOUNTER — Ambulatory Visit (HOSPITAL_COMMUNITY): Admission: RE | Admit: 2014-10-21 | Payer: Medicaid Other | Source: Ambulatory Visit

## 2014-11-16 ENCOUNTER — Other Ambulatory Visit: Payer: Self-pay | Admitting: Infectious Disease

## 2014-11-18 ENCOUNTER — Ambulatory Visit: Payer: Medicaid Other | Admitting: Physician Assistant

## 2014-11-21 ENCOUNTER — Ambulatory Visit: Payer: Medicaid Other | Admitting: Physician Assistant

## 2014-11-23 ENCOUNTER — Encounter: Payer: Self-pay | Admitting: Physician Assistant

## 2014-11-23 ENCOUNTER — Ambulatory Visit (INDEPENDENT_AMBULATORY_CARE_PROVIDER_SITE_OTHER): Payer: Medicaid Other | Admitting: Physician Assistant

## 2014-11-23 ENCOUNTER — Telehealth: Payer: Self-pay | Admitting: *Deleted

## 2014-11-23 VITALS — BP 110/68 | HR 84 | Ht 68.0 in | Wt 121.0 lb

## 2014-11-23 DIAGNOSIS — R197 Diarrhea, unspecified: Secondary | ICD-10-CM

## 2014-11-23 DIAGNOSIS — K219 Gastro-esophageal reflux disease without esophagitis: Secondary | ICD-10-CM

## 2014-11-23 DIAGNOSIS — R1013 Epigastric pain: Secondary | ICD-10-CM

## 2014-11-23 DIAGNOSIS — Z952 Presence of prosthetic heart valve: Secondary | ICD-10-CM

## 2014-11-23 DIAGNOSIS — Z7901 Long term (current) use of anticoagulants: Secondary | ICD-10-CM

## 2014-11-23 DIAGNOSIS — R110 Nausea: Secondary | ICD-10-CM | POA: Diagnosis not present

## 2014-11-23 DIAGNOSIS — R1310 Dysphagia, unspecified: Secondary | ICD-10-CM

## 2014-11-23 DIAGNOSIS — Z954 Presence of other heart-valve replacement: Secondary | ICD-10-CM

## 2014-11-23 MED ORDER — SACCHAROMYCES BOULARDII 250 MG PO CAPS: 250.0000 mg | ORAL_CAPSULE | Freq: Two times a day (BID) | ORAL | Status: DC

## 2014-11-23 MED ORDER — NA SULFATE-K SULFATE-MG SULF 17.5-3.13-1.6 GM/177ML PO SOLN: ORAL | Status: DC

## 2014-11-23 NOTE — Progress Notes (Signed)
Patient ID: Brandon [name], male   DOB: Jun 28, 1965, 49 y.o.   MRN: [id number]     History of Present Illness: Brandon [name] is a 49 year old male who was initially evaluated here on 10/16/2014 for evaluation of diarrhea and dysphagia. Vick has a rather complicated medical history including chronic pancreatitis with possible pancreatic insufficiency, functional bowel disorder (IBS and cyclic vomiting), multidrug-resistant HIV disease currently well controlled, and CAD status post aVR for which he is on warfarin. At that visit he reported that he has had diarrhea for 10 or 11 years. He was having 6-8 bowel movements daily with nocturnal stooling. His diarrhea was associated with nausea and vomiting. He reported that he had had recurrent bouts of C. difficile over the past 2 years. He was evaluated by infectious disease on 09/16/2014 at which time he was currently on vancomycin as part of the taper. Reported that his diarrhea never changed on the vancomycin. He was sent for C. difficile at his last visit which fortunately was negative. He was advised to restart his Creon and he was given trial of Bentyl. He was also scheduled for a barium swallow with tablet which she unfortunately did not have done. He was given a trial of ranitidine 300 mg at bedtime as well. He returns today he has a significant decrease in his heartburn with the ranitidine. He continues to complain of dysphagia that occurs at least 5 days per week. His dysphagia is to solids and occasionally liquids. He has had multiple episodes where he has had to spit his food up because it will not go down. He is not having any pain with swallowing. He continues to have 5 or 6 bowel movements a day that are varying now between soft formed and loose. He did have dark stools 2 days ago but has not had bright red blood per rectum. He continues to be quite distressed with the frequency of his bowel movements. He does think that the Creon has provided  some slight improvement. He has trouble swallowing the Creon and has been advised that he may than the capsule and sprinkle it on applesauce providing he takes it right away. He also reports epigastric pain several days per week that seems to be worse on an empty stomach, temporarily alleviated with ingestion of food, and then it comes back. He has nausea on an empty stomach as well.   Past Medical History  Diagnosis Date  . HIV (human immunodeficiency virus infection)   . Hypertension   . Stroke   . Pancreatitis   . Mechanical heart valve present   . Arthritis   . Anemia   . GERD (gastroesophageal reflux disease)   . Abdominal pain   . Weight loss, unintentional   . Constipation   . Nausea & vomiting   . Diarrhea   . 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 GI bleed   . IBS (irritable bowel syndrome)   . Interstitial cystitis   . Infectious colitis   . Gallstones     Past Surgical History  Procedure Laterality Date  . Cardiac surgery    . Knee surgery    . Cholecystectomy  02/12/2012    Procedure: LAPAROSCOPIC CHOLECYSTECTOMY;  Surgeon: Stark Klein, MD;  Location: [facility];  Service: General;  Laterality: N/A;  . Esophagogastroduodenoscopy N/A 07/24/2012    Procedure: ESOPHAGOGASTRODUODENOSCOPY (EGD);  Surgeon: Beryle Beams, MD;  Location: [facility];  Service: Endoscopy;  Laterality: N/A;  . Aortic valve replacement  Family History  Problem Relation Age of Onset  . Hypertension Father   . Cancer - Prostate Father   . Cancer Father     stomach  . Hypertension Sister   . Diabetes Maternal Aunt   . Cancer - Other Cousin   . Parkinson's disease Paternal Aunt    Social History  Substance Use Topics  . Smoking status: Current Every Day Smoker -- 0.50 packs/day for 20 years    Types: Cigars, Cigarettes  . Smokeless tobacco: Never Used     Comment: cutting back  . Alcohol Use: 0.0 oz/week    0 Standard drinks or equivalent per week     Comment: 12oz  beer/ per week    Current Outpatient Prescriptions  Medication Sig Dispense Refill  . dicyclomine (BENTYL) 20 MG tablet Take 1 tablet (20 mg total) by mouth 4 (four) times daily -  before meals and at bedtime. 30 tablet 0  . dolutegravir (TIVICAY) 50 MG tablet Take 1 tablet (50 mg total) by mouth 2 (two) times daily. 30 tablet 10  . ENSURE (ENSURE) Take 237 mLs by mouth 2 (two) times daily between meals. Provide a case per month: BMI<20 237 mL 6  . EPINEPHrine (EPI-PEN) 0.3 mg/0.3 mL DEVI Inject 0.3 mLs (0.3 mg total) into the muscle once. 1 Device 1  . escitalopram (LEXAPRO) 20 MG tablet TAKE 1 TABLET BY MOUTH   DAILY 30 tablet 3  . flunisolide (NASALIDE) 25 MCG/ACT (0.025%) SOLN Place 2 sprays into the nose at bedtime as needed. 1 Bottle 0  . lamiVUDine-zidovudine (COMBIVIR) 150-300 MG per tablet TAKE 1 TABLET BY MOUTH TWO TIMES DAILY. 60 tablet 11  . lipase/protease/amylase (CREON) 36000 UNITS CPEP capsule Take one po with each meal and snack. 180 capsule 3  . megestrol (MEGACE) 40 MG/ML suspension Take 800 mg by mouth daily.    . NORVIR 100 MG TABS tablet TAKE 1 TABLET BY MOUTH TWO   TIMES ADAY 60 tablet 3  . ondansetron (ZOFRAN ODT) 8 MG disintegrating tablet Take 1 tablet (8 mg total) by mouth every 8 (eight) hours as needed for nausea or vomiting. 30 tablet 0  . PREZISTA 600 MG tablet TAKE 1 TABLET (600 MG TOTAL) BY MOUTH TWO TIMES DAILY WITH A MEAL. 60 tablet 11  . prochlorperazine (COMPAZINE) 10 MG tablet TAKE 1 TABLET BY MOUTH EVERY 6 HOURS AS NEEDED FOR NAUSEA 120 tablet 0  . ranitidine (ZANTAC) 300 MG tablet Take 1 tablet (300 mg total) by mouth at bedtime. 30 tablet 6  . valACYclovir (VALTREX) 1000 MG tablet TAKE 1 TABLET (1,000 MG TOTAL) BY MOUTH DAILY. 30 tablet 5  . VIREAD 300 MG tablet TAKE 1 TABLET (300 MG TOTAL) BY MOUTH DAILY. 30 tablet 11  . warfarin (COUMADIN) 1 MG tablet Take as directed with 5mg  tablet per [facility]    . warfarin (COUMADIN) 5 MG tablet Take 1 tablet  daily and adjust as directed by [facility]    . zolpidem (AMBIEN) 10 MG tablet Take 10 mg by mouth at bedtime.     . Na Sulfate-K Sulfate-Mg Sulf SOLN Please take as directed per colonoscopy instructions 354 mL 0  . saccharomyces boulardii (FLORASTOR) 250 MG capsule Take 1 capsule (250 mg total) by mouth 2 (two) times daily. For 30 days 60 capsule 1   No current facility-administered medications for this visit.   Facility-Administered Medications Ordered in Other Visits  Medication Dose Route Frequency Provider Last Rate Last Dose  .  0.9 %  sodium chloride infusion   Intravenous Once Campbell Riches, MD       Allergies  Allergen Reactions  . Bactrim [Sulfamethoxazole-Trimethoprim]   . Bee Venom Anaphylaxis  . Sulfa Antibiotics Anaphylaxis  . Truvada [Emtricitabine-Tenofovir Df] Anaphylaxis and Rash    Takes plain tenofovir at home  . Lidoderm [Lidocaine] Other (See Comments)    Reaction unknown  . Raltegravir     resistance  . Ceftriaxone Rash  . Sulfamethoxazole Itching, Other (See Comments) and Rash    Other reaction(s): Hypotension (ALLERGY/intolerance)     Review of Systems: Per history of present illness otherwise negative.  LAB RESULTS: Stool for C. difficile on 10/18/2014 was negative. Fecal lactoferrin on 10/17/2013 was negative. CBC on 10/14/2014 white count 6.5, hemoglobin 13.9, hematocrit 40.2, platelets 205,000. Comprehensive metabolic panel 11/91/4782 total bili 0.3, alkaline phosphatase 65, AST 13, ALT 9. IgA 230, TTG 1   Physical Exam: BP 110/68 mmHg  Pulse 84  Ht 5\' 8"  (1.727 m)  Wt 121 lb (54.885 kg)  BMI 18.40 kg/m2 General: Pleasant, well developed ,male in no acute distress Head: Normocephalic and atraumatic Eyes:  sclerae anicteric, conjunctiva pink  Ears: Normal auditory acuity Lungs: Clear throughout to auscultation Heart: Regular rate and rhythm. Mechanical heart sounds. Abdomen: Soft, non distended, non-tender. No masses, no  hepatomegaly. Normal bowel sounds Rectal: Small amount dark stool, Hemoccult-negative Musculoskeletal: Symmetrical with no gross deformities  Extremities: No edema  Neurological: Alert oriented x 4, grossly nonfocal Psychological:  Alert and cooperative. Normal mood and affect  Assessment and Recommendations: 49 year old male with history of HIV disease, chronic pancreatitis with possible pancreatic insufficiency, CAD status post AVR on chronic warfarin, status post several bouts of recurrent C. difficile with ongoing diarrhea. Repeat C. difficile testing was recently negative. Stool lactoferrin was negative. 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 He will also be scheduled for colonoscopy to evaluate for possible microscopic colitis.The risks, benefits, and alternatives to colonoscopy with possible biopsy and possible polypectomy were discussed with the patient and they consent to proceed.   With regards to his dysphagia this may be due to some poorly controlled reflux or esophagitis. He has had relief of his heartburn with ranitidine 300 mg at bedtime. He will be scheduled for a barium swallow with tablet to evaluate for possible stricture, esophageal wall thickening, dysmotility etc. he will then be scheduled for an EGD to evaluate for esophagitis, gastritis, ulcer, etc. and perform dilation if necessary. The risks, benefits, and alternatives to endoscopy with possible biopsy and possible dilation were discussed with the patient and they consent to proceed.  Will hold warfarin  5 days prior to endoscopic procedures - will instruct when and how to resume after procedure. Benefits and risks of procedure explained including risks of bleeding, perforation, infection, missed lesions, reactions to medications and possible need for hospitalization and surgery for complications. Additional rare but real risk of stroke or other  vascular clotting events off warfarin also explained and need to seek urgent help if any signs of these problems occur. Will communicate by phone or EMR with patient's  prescribing provider to confirm that holding warfarin is reasonable in this case.         Jamon Hayhurst, Deloris Ping 11/23/2014,

## 2014-11-23 NOTE — Telephone Encounter (Signed)
Coag letter fax over to Dr. Eileen Stanford office at [facility] fax number [phone]. Patient also provider a number to for Dr. Quentin Cornwall nurse Sharion Dove [phone].

## 2014-11-23 NOTE — Telephone Encounter (Signed)
  11/23/2014   RE: Brandon [name] DOB: March 11, 1966 MRN: [id number]   Dear Dr. Quentin Cornwall,    We have scheduled the above patient for an endoscopic procedure. Our records show that he is on anticoagulation therapy.   Please advise as to how long the patient may come off his therapy of Warfarin prior to the procedure, which is scheduled for 01-24-15.  Please fax back/ or route the completed form to [facility] at [phone].   Sincerely,    Gerlean Ren

## 2014-11-23 NOTE — Patient Instructions (Addendum)
You have been scheduled for a Barium Esophogram at [facility] (1st floor of the hospital) on 12-01-14 at 10:30am. Please arrive 15 minutes prior to your appointment for registration. Make certain not to have anything to eat or drink 6 hours prior to your test. If you need to reschedule for any reason, please contact radiology at [phone] to do so. __________________________________________________________________ A barium swallow is an examination that concentrates on views of the esophagus. This tends to be a double contrast exam (barium and two liquids which, when combined, create a gas to distend the wall of the oesophagus) or single contrast (non-ionic iodine based). The study is usually tailored to your symptoms so a good history is essential. Attention is paid during the study to the form, structure and configuration of the esophagus, looking for functional disorders (such as aspiration, dysphagia, achalasia, motility and reflux) EXAMINATION You may be asked to change into a gown, depending on the type of swallow being performed. A radiologist and radiographer will perform the procedure. The radiologist will advise you of the type of contrast selected for your procedure and direct you during the exam. You will be asked to stand, sit or lie in several different positions and to hold a small amount of fluid in your mouth before being asked to swallow while the imaging is performed .In some instances you may be asked to swallow barium coated marshmallows to assess the motility of a solid food bolus. The exam can be recorded as a digital or video fluoroscopy procedure. POST PROCEDURE It will take 1-2 days for the barium to pass through your system. To facilitate this, it is important, unless otherwise directed, to increase your fluids for the next 24-48hrs and to resume your normal diet.  This test typically takes about 30 minutes to  perform. __________________________________________________________________________________   Brandon [name] have been scheduled for an endoscopy and colonoscopy. Please follow the written instructions given to you at your visit today. Please pick up your prep supplies at the pharmacy within the next 1-3 days. If you use inhalers (even only as needed), please bring them with you on the day of your procedure.  Please continue taking Ranitidine  Continue taking Creon(open capsules and sprinkle on applesauce)  We have sent the following medications to your pharmacy for you to pick up at your convenience: [facility] has requested that you go to the basement for the following lab work on 01-20-15 to check your PT/INR level before you procedure on 01-24-15

## 2014-11-29 NOTE — Progress Notes (Signed)
Agree with initial assessment and plans. Need to confirm with the managing physician the best way to handle his anticoagulation for the procedures

## 2014-12-01 ENCOUNTER — Other Ambulatory Visit: Payer: Self-pay

## 2014-12-01 ENCOUNTER — Ambulatory Visit (HOSPITAL_COMMUNITY)
Admission: RE | Admit: 2014-12-01 | Discharge: 2014-12-01 | Disposition: A | Discharge status: Home / Self Care | Payer: Medicaid Other | Source: Ambulatory Visit | Attending: Physician Assistant | Admitting: Physician Assistant

## 2014-12-01 ENCOUNTER — Telehealth: Payer: Self-pay

## 2014-12-01 DIAGNOSIS — R1310 Dysphagia, unspecified: Secondary | ICD-10-CM | POA: Diagnosis not present

## 2014-12-01 DIAGNOSIS — R197 Diarrhea, unspecified: Secondary | ICD-10-CM

## 2014-12-01 NOTE — Telephone Encounter (Signed)
Procedures cancelled in the [place]. Barium swallow being done today. Order in epic for fecal elastase. Pt scheduled for OV with Dr. Henrene Pastor 01/12/15@2 :15pm. Left message for pt to call back.

## 2014-12-01 NOTE — Telephone Encounter (Signed)
-----   Message from Irene Shipper, MD sent at 11/30/2014  6:58 PM EDT ----- Regarding: Cancel [facility] ; plan other tests and follow up Vaughan Basta,  I have reviewed Lori's notes, test results, and tentative plans for procedures. Also, I spoke with his cardiologist, Dr Eileen Stanford at [facility], today regarding anticoagulation. The patient is high risk to come off coumadin and would need heparin window at hospital if procedures are necessary -- I'm not sure they are. Had prior negative EGD 2 years ago for same symptoms. Diarrhea is chronic without weight loss and HIV in remission on medication. Anyway, I recommend the following:  1. Cancel [facility] procedures 01-24-15 (let patient know) 2. Have him get Barium Swallow for dysphagia. Looks like he no showed for one recently scheduled 3. Get stool for fecal elastase  When ALL of this is complete, make routine OV with me (no overbook). Please convert this message to phone note for future reference. Thanks   Dr. Henrene Pastor

## 2014-12-07 NOTE — Telephone Encounter (Signed)
Please see phone note 12-01-14. Per Dr. Henrene Pastor cancel Endo/colon. Vaughan Basta notified patient of new recommendations.

## 2014-12-08 ENCOUNTER — Other Ambulatory Visit: Payer: Self-pay | Admitting: Infectious Disease

## 2014-12-19 ENCOUNTER — Encounter: Payer: Self-pay | Admitting: Infectious Disease

## 2014-12-19 ENCOUNTER — Ambulatory Visit (INDEPENDENT_AMBULATORY_CARE_PROVIDER_SITE_OTHER): Payer: Medicaid Other | Admitting: Infectious Disease

## 2014-12-19 VITALS — BP 121/84 | HR 80 | Temp 98.5°F | Wt 124.0 lb

## 2014-12-19 DIAGNOSIS — B20 Human immunodeficiency virus [HIV] disease: Secondary | ICD-10-CM

## 2014-12-19 DIAGNOSIS — K861 Other chronic pancreatitis: Secondary | ICD-10-CM

## 2014-12-19 DIAGNOSIS — G43A0 Cyclical vomiting, not intractable: Secondary | ICD-10-CM | POA: Diagnosis not present

## 2014-12-19 DIAGNOSIS — R1115 Cyclical vomiting syndrome unrelated to migraine: Secondary | ICD-10-CM

## 2014-12-19 DIAGNOSIS — A0471 Enterocolitis due to Clostridium difficile, recurrent: Secondary | ICD-10-CM

## 2014-12-19 DIAGNOSIS — A09 Infectious gastroenteritis and colitis, unspecified: Secondary | ICD-10-CM

## 2014-12-19 DIAGNOSIS — Z952 Presence of prosthetic heart valve: Secondary | ICD-10-CM

## 2014-12-19 DIAGNOSIS — F339 Major depressive disorder, recurrent, unspecified: Secondary | ICD-10-CM | POA: Diagnosis not present

## 2014-12-19 DIAGNOSIS — R112 Nausea with vomiting, unspecified: Secondary | ICD-10-CM | POA: Diagnosis not present

## 2014-12-19 DIAGNOSIS — Z23 Encounter for immunization: Secondary | ICD-10-CM

## 2014-12-19 DIAGNOSIS — L989 Disorder of the skin and subcutaneous tissue, unspecified: Secondary | ICD-10-CM | POA: Diagnosis not present

## 2014-12-19 DIAGNOSIS — R197 Diarrhea, unspecified: Secondary | ICD-10-CM

## 2014-12-19 DIAGNOSIS — A047 Enterocolitis due to Clostridium difficile: Secondary | ICD-10-CM | POA: Diagnosis not present

## 2014-12-19 DIAGNOSIS — Z954 Presence of other heart-valve replacement: Secondary | ICD-10-CM

## 2014-12-19 HISTORY — DX: Disorder of the skin and subcutaneous tissue, unspecified: L98.9

## 2014-12-19 LAB — COMPLETE METABOLIC PANEL WITH GFR
ALK PHOS: 71 U/L (ref 40–115)
ALT: 12 U/L (ref 9–46)
AST: 15 U/L (ref 10–40)
Albumin: 3.9 g/dL (ref 3.6–5.1)
BUN: 11 mg/dL (ref 7–25)
CHLORIDE: 104 mmol/L (ref 98–110)
CO2: 27 mmol/L (ref 20–31)
Calcium: 9.2 mg/dL (ref 8.6–10.3)
Creat: 1.34 mg/dL (ref 0.60–1.35)
GFR, EST NON AFRICAN AMERICAN: 63 mL/min (ref >=60)
GFR, Est African American: 72 mL/min (ref >=60)
GLUCOSE: 155 mg/dL — AB (ref 65–99)
POTASSIUM: 4.3 mmol/L (ref 3.5–5.3)
SODIUM: 136 mmol/L (ref 135–146)
Total Bilirubin: 0.4 mg/dL (ref 0.2–1.2)
Total Protein: 6.3 g/dL (ref 6.1–8.1)

## 2014-12-19 LAB — CBC WITH DIFFERENTIAL/PLATELET
BASOS PCT: 1 % (ref 0–1)
Basophils Absolute: 0.1 10*3/uL (ref 0.0–0.1)
Eosinophils Absolute: 0.2 10*3/uL (ref 0.0–0.7)
Eosinophils Relative: 4 % (ref 0–5)
HCT: 43.2 % (ref 39.0–52.0)
HEMOGLOBIN: 14.7 g/dL (ref 13.0–17.0)
LYMPHS ABS: 2.1 10*3/uL (ref 0.7–4.0)
Lymphocytes Relative: 37 % (ref 12–46)
MCH: 37.8 pg — AB (ref 26.0–34.0)
MCHC: 34 g/dL (ref 30.0–36.0)
MCV: 111.1 fL — ABNORMAL HIGH (ref 78.0–100.0)
MONOS PCT: 5 % (ref 3–12)
MPV: 10.8 fL (ref 8.6–12.4)
Monocytes Absolute: 0.3 10*3/uL (ref 0.1–1.0)
NEUTROS ABS: 3 10*3/uL (ref 1.7–7.7)
NEUTROS PCT: 53 % (ref 43–77)
Platelets: 214 10*3/uL (ref 150–400)
RBC: 3.89 MIL/uL — ABNORMAL LOW (ref 4.22–5.81)
RDW: 13.4 % (ref 11.5–15.5)
WBC: 5.7 10*3/uL (ref 4.0–10.5)

## 2014-12-19 NOTE — Progress Notes (Signed)
Chief complaint: follouwp for HIV but also with lesion on right foot  Subjective:    Patient ID: Brandon [name], male    DOB: 01-08-1966, 49 y.o.   MRN: [id number]  Diarrhea  Associated symptoms include abdominal pain and arthralgias. Pertinent negatives include no chills, coughing, fever or vomiting. The symptoms are aggravated by stress. Risk factors include recent antibiotic use. The treatment provided mild relief.  Abdominal Pain Associated symptoms include arthralgias and diarrhea. Pertinent negatives include no constipation, dysuria, fever, hematuria or vomiting.    [name] is a highly complicated man with history of  HIV/AIDS and Multi-DRUG RESISTANT virus formerly followed at [facility]. His HIV nadir was  20 when we first met him   Apparently he had at last time at [facility] been taken  off ARV and placed with hospice.  He had been on various complicated antiretroviral regimens in the past, with unfortunate GENOTYPIC resistance to all non-nucleoside reverse transcriptase inhibitors and all NRTIs, Resistance to all protease inhibitors with the exception of Prezista which had some activity genotypically,, Resistance to Isentress, and Elvitegravir and 100X reduced S to dolutegravir having both a 148H and 140S  and with Dual tropic virus.  02/12/2010 phenotype at [facility] showed:  RT: NRTI: ABC, DDI, D4T, AZT, TDF: resistant; 3TC, FTC: susceptible; NNRTI: EFV susceptible; RPV, NVP, ETR, DLV: Resistant; PI: pan-resistant  He had decided now to go back onto ARVS and is referred to [facility].  We  Have seen him and placed him on a  salvage regimen of Prezista 653m  Twice daily boosted with Norvir 1076mtwice daily, Tivicay twice daily, Combivir twice daily and once daily Viread.  And since then he has now an UNDETECTABLE VIRAL LOAD <20 FOR MORE THAN TWO YEARS  And CD4 now at 380   Lab Results  Component Value Date   HIV1RNAQUANT <20 07/18/2014   Lab Results  Component Value Date   CD4TABS 380* 07/18/2014   CD4TABS 300* 03/31/2014   CD4TABS 250* 01/26/2014    [name] returns to clinic for routine follow-up today. He has had his nausea and diarrhea worked up by [facility] has been started on Creon for pancreatic enzyme replacement. He was to have an upper and lower endoscopy but is felt to be too high risk to be taken off of his Coumadin due to his aortic valve replacement.  His frequency of loose bowel movement seems to have lessened significantly and his most recent test for C. difficile was negative. He is to have other stool tests checked and asked [facility] let him take a specimen that he could bring to GI.  Nausea is also under better control at present. He has a new complaint of a painful hyperpigmented area under his right foot which is making it difficult for him to walk no purulence has been noted from that site today.   Past Medical History  Diagnosis Date  . HIV [place]human immunodeficiency virus infection) ([place]  . Hy[place]ension   . [place]  . Pancreatitis   . Mechanical heart valve present   . Arthritis   . Anemia   . GERD (gastroesophageal reflux disease)   . Abdominal pain   . Weight loss, unintentional   . Constipation   . Nausea & vomiting   . Diarrhea   . 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 GI bleed   . IBS (irritable bowel syndrome)   . Interstitial cystitis   . Infectious  colitis   . Gallstones     Past Surgical History  Procedure Laterality Date  . Cardiac surgery    . Knee surgery    . Cholecystectomy  02/12/2012    Procedure: LAPAROSCOPIC CHOLECYSTECTOMY;  Surgeon: Stark Klein, MD;  Location: [place];  Service: General;  Laterality: N/A;  . Esophagogastroduodenoscopy N/A 07/24/2012    Procedure: ESOPHAGOGASTRODUODENOSCOPY (EGD);  Surgeon: Beryle Beams, MD;  Location: [facility] ENDOSCOPY;  Service: Endoscopy;  Laterality: N/A;  . Aortic valve replacement      Family History  Problem Relation Age  of Onset  . Hypertension Father   . Cancer - Prostate Father   . Cancer Father     stomach  . Hypertension Sister   . Diabetes Maternal Aunt   . Cancer - Other Cousin   . Parkinson's disease Paternal Aunt       Social History   Social History  . Marital Status: Married    Spouse Name: N/A  . Number of Children: 6  . Years of Education: N/A   Occupational History  . disability rep    Social History Main Topics  . Smoking status: Current Every Day Smoker -- 0.10 packs/day for 20 years    Types: Cigars, Cigarettes  . Smokeless tobacco: Never Used     Comment: cutting back  . Alcohol Use: 0.0 oz/week    0 Standard drinks or equivalent per week     Comment: 12oz beer/ per week   . Drug Use: 7.00 per week    Special: Marijuana     Comment: daily 2 joints   . Sexual Activity:    Partners: Female    Patent examiner Protection: Condom   Other Topics Concern  . None   Social History Narrative    Allergies  Allergen Reactions  . Bactrim [Sulfamethoxazole-Trimethoprim]   . Bee Venom Anaphylaxis  . Sulfa Antibiotics Anaphylaxis  . Truvada [Emtricitabine-Tenofovir Df] Anaphylaxis and Rash    Takes plain tenofovir at home  . Lidoderm [Lidocaine] Other (See Comments)    Reaction unknown  . Raltegravir     resistance  . Ceftriaxone Rash  . Sulfamethoxazole Itching, Other (See Comments) and Rash    Other reaction(s): Hypotension (ALLERGY/intolerance)     Current outpatient prescriptions:  .  dicyclomine (BENTYL) 20 MG tablet, Take 1 tablet (20 mg total) by mouth 4 (four) times daily -  before meals and at bedtime., Disp: 30 tablet, Rfl: 0 .  EPINEPHrine (EPI-PEN) 0.3 mg/0.3 mL DEVI, Inject 0.3 mLs (0.3 mg total) into the muscle once., Disp: 1 Device, Rfl: 1 .  escitalopram (LEXAPRO) 20 MG tablet, TAKE 1 TABLET BY MOUTH   DAILY, Disp: 30 tablet, Rfl: 3 .  flunisolide (NASALIDE) 25 MCG/ACT (0.025%) SOLN, Place 2 sprays into the nose at bedtime as needed., Disp: 1 Bottle,  Rfl: 0 .  lamiVUDine-zidovudine (COMBIVIR) 150-300 MG tablet, TAKE 1 TABLET BY MOUTH TWO TIMES DAILY., Disp: 60 tablet, Rfl: 3 .  lipase/protease/amylase (CREON) 36000 UNITS CPEP capsule, Take one po with each meal and snack., Disp: 180 capsule, Rfl: 3 .  megestrol (MEGACE) 40 MG/ML suspension, Take 800 mg by mouth daily., Disp: , Rfl:  .  NORVIR 100 MG TABS tablet, TAKE 1 TABLET BY MOUTH TWO   TIMES ADAY, Disp: 60 tablet, Rfl: 3 .  ondansetron (ZOFRAN ODT) 8 MG disintegrating tablet, Take 1 tablet (8 mg total) by mouth every 8 (eight) hours as needed for nausea or vomiting., Disp: 30 tablet, Rfl: 0 .  PREZISTA 600 MG tablet, TAKE 1 TABLET (600 MG TOTAL) BY MOUTH TWO TIMES DAILY WITH A MEAL., Disp: 60 tablet, Rfl: 3 .  prochlorperazine (COMPAZINE) 10 MG tablet, TAKE 1 TABLET BY MOUTH EVERY 6 HOURS AS NEEDED FOR NAUSEA, Disp: 120 tablet, Rfl: 0 .  ranitidine (ZANTAC) 300 MG tablet, Take 1 tablet (300 mg total) by mouth at bedtime., Disp: 30 tablet, Rfl: 6 .  saccharomyces boulardii (FLORASTOR) 250 MG capsule, Take 1 capsule (250 mg total) by mouth 2 (two) times daily. For 30 days, Disp: 60 capsule, Rfl: 1 .  TIVICAY 50 MG tablet, TAKE 1 TABLET (50 MG TOTAL) BY MOUTH TWO   (TWO) TIMES DAILY., Disp: 30 tablet, Rfl: 3 .  VIREAD 300 MG tablet, TAKE 1 TABLET (300 MG TOTAL) BY MOUTH DAILY., Disp: 30 tablet, Rfl: 3 .  warfarin (COUMADIN) 1 MG tablet, Take as directed with 19m tablet per [facility], Disp: , Rfl:  .  warfarin (COUMADIN) 5 MG tablet, Take 1 tablet daily and adjust as directed by Coumadin Clinic, Disp: , Rfl:  .  zolpidem (AMBIEN) 10 MG tablet, Take 10 mg by mouth at bedtime. , Disp: , Rfl:  .  ENSURE (ENSURE), Take 237 mLs by mouth 2 (two) times daily between meals. Provide a case per month: BMI<20, Disp: 237 mL, Rfl: 6 .  valACYclovir (VALTREX) 1000 MG tablet, TAKE 1 TABLET (1,000 MG TOTAL) BY MOUTH DAILY., Disp: 30 tablet, Rfl: 5 No current facility-administered medications for this  visit.  Facility-Administered Medications Ordered in Other Visits:  .  0.9 %  sodium chloride infusion, , Intravenous, Once, JCampbell Riches MD   Review of Systems  Constitutional: Negative for fever, chills, diaphoresis, activity change and unexpected weight change.  HENT: Negative for congestion, rhinorrhea, sinus pressure, sneezing, sore throat and trouble swallowing.   Eyes: Negative for photophobia and visual disturbance.  Respiratory: Negative for cough, chest tightness, shortness of breath, wheezing and stridor.   Cardiovascular: Negative for chest pain, palpitations and leg swelling.  Gastrointestinal: Positive for abdominal pain and diarrhea. Negative for vomiting, constipation, abdominal distention and anal bleeding.  Genitourinary: Negative for dysuria, hematuria and flank pain.  Musculoskeletal: Positive for arthralgias. Negative for back pain, joint swelling and gait problem.  Skin: Positive for color change. Negative for rash and wound.  Neurological: Positive for weakness. Negative for tremors.  Hematological: Negative for adenopathy. Does not bruise/bleed easily.  Psychiatric/Behavioral: Negative for behavioral problems, confusion, sleep disturbance, decreased concentration and agitation.       Objective:   Physical Exam  Constitutional: He is oriented to person, place, and time. No distress.  HENT:  Head: Normocephalic and atraumatic.  Mouth/Throat: Oropharynx is clear and moist. No oropharyngeal exudate.  Eyes: Conjunctivae and EOM are normal. No scleral icterus.  Neck: Normal range of motion. Neck supple. No JVD present.  Cardiovascular: Regular rhythm and normal heart sounds.   Pulmonary/Chest: Effort normal. No respiratory distress. He has no wheezes.  Abdominal: Soft. Bowel sounds are normal. He exhibits no distension. There is tenderness in the right upper quadrant. There is no rigidity, no rebound and no guarding.  Musculoskeletal: He exhibits no edema or  tenderness.  Lymphadenopathy:    He has no cervical adenopathy.  Neurological: He is alert and oriented to person, place, and time. He displays no atrophy. He exhibits normal muscle tone. Coordination normal.  Skin: Skin is warm and dry. Rash noted. He is not diaphoretic. No erythema. No pallor.  Psychiatric: He has a  normal mood and affect. His behavior is normal. Judgment and thought content normal.   Right foot lesion 12/19/14:           Assessment & Plan:    # 1HIV Disease, hx of AIDS,  Highly Resistant Virus. Perfect control and commended him again  Continue:  --Tivicay 65m BID --Prezista 6075mBID with  --Norvir 10066mID --Combivir 1 tablet BID --Viread q daily  HE SHOULD NOT TAKE ENSURE AT THE SAME TIME AS HIS TIVICAY!!!!  We discussed switch to DESCOVY  And BID AZT instead of Combivir and Viread but he told me again that he was allergic to part of Truvada which caused a rash--hard to believe he truly had allergy to Emtricitabine.   Will consider in future   #2 Chronic diarrhea cyclic vomiting: Greatly appreciate GI help here  #3 hx of AV replacement on anticoaguation: followed by PCP  #4 Major Depression, chronic and Recurrent Depression: Continue SSRI and counseling here when needed   #5 Foot lesion: Could be hematoma could be tented potentially an infection he does not want gland bionics will counsel him to apply warm compresses and he is going to see a podiatrist  I spent greater than 40 minutes with the patient including greater than 50% of time in face to face counsel of the patient re his HIV, his diarrhea cyclic vomiting AV replacement depression and foot lesion and in coordination of their care.

## 2014-12-20 LAB — T-HELPER CELL (CD4) - (RCID CLINIC ONLY)
CD4 T CELL ABS: 290 /uL — AB (ref 400–2700)
CD4 T CELL HELPER: 15 % — AB (ref 33–55)

## 2014-12-20 LAB — RPR

## 2014-12-21 LAB — HIV-1 RNA QUANT-NO REFLEX-BLD

## 2015-01-12 ENCOUNTER — Ambulatory Visit (INDEPENDENT_AMBULATORY_CARE_PROVIDER_SITE_OTHER): Payer: Medicaid Other | Admitting: Internal Medicine

## 2015-01-12 ENCOUNTER — Encounter: Payer: Self-pay | Admitting: Internal Medicine

## 2015-01-12 VITALS — BP 100/56 | HR 84 | Ht 68.0 in | Wt 122.5 lb

## 2015-01-12 DIAGNOSIS — K589 Irritable bowel syndrome without diarrhea: Secondary | ICD-10-CM | POA: Diagnosis not present

## 2015-01-12 DIAGNOSIS — R1310 Dysphagia, unspecified: Secondary | ICD-10-CM | POA: Diagnosis not present

## 2015-01-12 DIAGNOSIS — K224 Dyskinesia of esophagus: Secondary | ICD-10-CM

## 2015-01-12 DIAGNOSIS — R112 Nausea with vomiting, unspecified: Secondary | ICD-10-CM

## 2015-01-12 MED ORDER — DICYCLOMINE HCL 20 MG PO TABS: 20.0000 mg | ORAL_TABLET | Freq: Three times a day (TID) | ORAL | Status: DC

## 2015-01-12 NOTE — Progress Notes (Signed)
HISTORY OF PRESENT ILLNESS:  Brandon [name] is a 49 y.o. male with extremely complicated past medical history including but not limited to HIV disease with highly resistant virus, hypertension, aortic valve replacement on chronic anticoagulation, prior stroke, history of pancreatitis, chronic dysphagia, chronic diarrhea, prior history of Clostridium difficile diarrhea, and IBS. He has been evaluated in this office recently for complaints of dysphagia, abdominal pain, and diarrhea. He is accompanied today by his wife. In terms of dysphagia, upper endoscopy for the same complaint in May 2014 was normal. We recently set him up for a barium esophagram with tablet. Examination was negative with no evidence for stricture. Patient reports intermittent problems when drinking liquids only of severe discomfort lasting approximately 10 seconds. He states it feels like the liquid does not go down. No vomiting. He denies reflux symptoms and takes ranitidine at night. Next, he describes abdominal cramping discomfort which is followed by urgency and loose stools. There can be associated diaphoresis with nausea and vomiting when the cramping discomfort as severe. Problems tend to occur most days. Symptoms exacerbated by meals. No bleeding or other issues. He has used dicyclomine infrequently. This seems to have helped. Actually, did result in some constipation. In terms of potential GI evaluations endoscopically, I did speak with his cardiologist at [facility]. He states that the patient would be at extremely high risk for thromboembolic events with manipulation of his anticoagulation. He is accompanied today by his wife   REVIEW OF SYSTEMS:  All non-GI ROS negative except for anxiety, muscle cramps, depression, fatigue  Past Medical History  Diagnosis Date  . HIV (human immunodeficiency virus infection) ([place])   . Hypertension   . Stroke ([place])   . Pancreatitis   . Mechanical heart valve present   . Arthritis    . Anemia   . GERD (gastroesophageal reflux disease)   . Abdominal pain   . Weight loss, unintentional   . Constipation   . Nausea & vomiting   . Diarrhea   . 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 GI bleed   . IBS (irritable bowel syndrome)   . Interstitial cystitis   . Infectious colitis   . Gallstones   . Foot lesion 12/19/2014  . Gastric AVM     Past Surgical History  Procedure Laterality Date  . Cardiac surgery    . Knee surgery    . Cholecystectomy  02/12/2012    Procedure: LAPAROSCOPIC CHOLECYSTECTOMY;  Surgeon: Stark Klein, MD;  Location: [place];  Service: General;  Laterality: N/A;  . Esophagogastroduodenoscopy N/A 07/24/2012    Procedure: ESOPHAGOGASTRODUODENOSCOPY (EGD);  Surgeon: Beryle Beams, MD;  Location: [facility] ENDOSCOPY;  Service: Endoscopy;  Laterality: N/A;  . Aortic valve replacement      Social History Brandon [name]  reports that he has been smoking Cigars and Cigarettes.  He has a 2 pack-year smoking history. He has never used smokeless tobacco. He reports that he drinks alcohol. He reports that he uses illicit drugs (Marijuana) about 7 times per week.  family history includes Cancer in his father; Cancer - Other in his cousin; Cancer - Prostate in his father; Diabetes in his maternal aunt; Hypertension in his father and sister; Parkinson's disease in his paternal aunt.  Allergies  Allergen Reactions  . Bactrim [Sulfamethoxazole-Trimethoprim]   . Bee Venom Anaphylaxis  . Sulfa Antibiotics Anaphylaxis  . Truvada [Emtricitabine-Tenofovir Df] Anaphylaxis and Rash    Takes plain tenofovir at home  . Lidoderm [Lidocaine] Other (See Comments)  Reaction unknown  . Raltegravir     resistance  . Ceftriaxone Rash  . Sulfamethoxazole Itching, Other (See Comments) and Rash    Other reaction(s): Hypotension (ALLERGY/intolerance)       PHYSICAL EXAMINATION: Vital signs: BP 100/56 mmHg  Pulse 84  Ht 5\' 8"  (1.727 m)  Wt 122 lb 8 oz  (55.566 kg)  BMI 18.63 kg/m2  Constitutional: Thin, generally well-appearing, no acute distress Psychiatric: alert and oriented x3, cooperative Eyes: extraocular movements intact, anicteric, conjunctiva pink Mouth: oral pharynx moist, no lesions. No thrush Neck: supple without thyromegaly; no lymphadenopathy Cardiovascular: heart regular rate and rhythm with mechanical heart sounds Lungs: clear to auscultation bilaterally Abdomen: soft, nontender, nondistended, no obvious ascites, no peritoneal signs, normal bowel sounds, no organomegaly Rectal: Omitted Extremities: no clubbing cyanosis or lower extremity edema bilaterally Skin: no lesions on visible extremities Neuro: No focal deficits. No asterixis.   ASSESSMENT:  #1. Chronic dysphagia. Almost certainly esophageal spasm by history and negative evaluations #2. Irritable bowel syndrome. This to explain postprandial abdominal discomfort with urgency and loose stools #3. Intermittent nausea with vomiting with severe abdominal pain. Consistent with vagal reaction #4. Multiple medical problems. Significant   PLAN:  #1. Reassurance regarding dysphagia #2. Long discussion on irritable bowel syndrome. #3. Recommend more liberal use of dicyclomine for IBS diarrhea symptoms #4. Dicyclomine may reduce risk of vagal reactions with associated vomiting #5. Return to the care of his primary providers. GI follow-up as needed  25 minutes was spent face-to-face with the patient. The majority of the time was spent counseling him regarding his multiple GI complaints, there basis, and treatment recommendations. Also answered multiple questions from the patient and his wife

## 2015-01-12 NOTE — Patient Instructions (Signed)
We have sent the following medications to your pharmacy for you to pick up at your convenience:  Bentyl  

## 2015-01-13 NOTE — Progress Notes (Signed)
Thanks for taking care of [facility] for me!

## 2015-01-24 ENCOUNTER — Encounter: Payer: Medicaid Other | Admitting: Internal Medicine

## 2015-01-24 ENCOUNTER — Telehealth: Payer: Self-pay

## 2015-01-26 ENCOUNTER — Encounter: Payer: Self-pay | Admitting: Internal Medicine

## 2015-01-26 NOTE — Telephone Encounter (Signed)
Had not had any luck faxing patient's Bentyl rx.  Called it into pharmacy instead

## 2015-02-06 ENCOUNTER — Other Ambulatory Visit: Payer: Self-pay | Admitting: Infectious Disease

## 2015-02-06 DIAGNOSIS — F339 Major depressive disorder, recurrent, unspecified: Secondary | ICD-10-CM

## 2015-02-06 DIAGNOSIS — B20 Human immunodeficiency virus [HIV] disease: Secondary | ICD-10-CM

## 2015-02-07 MED ORDER — LAMIVUDINE-ZIDOVUDINE 150-300 MG PO TABS: ORAL_TABLET | ORAL | Status: DC

## 2015-02-07 MED ORDER — DOLUTEGRAVIR SODIUM 50 MG PO TABS: ORAL_TABLET | ORAL | Status: DC

## 2015-02-07 MED ORDER — TENOFOVIR DISOPROXIL FUMARATE 300 MG PO TABS: ORAL_TABLET | ORAL | Status: DC

## 2015-02-07 MED ORDER — DARUNAVIR ETHANOLATE 600 MG PO TABS: ORAL_TABLET | ORAL | Status: DC

## 2015-03-21 ENCOUNTER — Encounter: Payer: Self-pay | Admitting: Infectious Disease

## 2015-03-21 ENCOUNTER — Ambulatory Visit: Payer: Medicaid Other | Admitting: *Deleted

## 2015-03-21 ENCOUNTER — Ambulatory Visit (INDEPENDENT_AMBULATORY_CARE_PROVIDER_SITE_OTHER): Payer: Medicaid Other | Admitting: Infectious Disease

## 2015-03-21 VITALS — BP 152/92 | HR 70 | Temp 98.2°F | Ht 68.0 in | Wt 126.0 lb

## 2015-03-21 DIAGNOSIS — R0981 Nasal congestion: Secondary | ICD-10-CM

## 2015-03-21 DIAGNOSIS — B20 Human immunodeficiency virus [HIV] disease: Secondary | ICD-10-CM

## 2015-03-21 DIAGNOSIS — F411 Generalized anxiety disorder: Secondary | ICD-10-CM

## 2015-03-21 DIAGNOSIS — Z954 Presence of other heart-valve replacement: Secondary | ICD-10-CM

## 2015-03-21 DIAGNOSIS — F339 Major depressive disorder, recurrent, unspecified: Secondary | ICD-10-CM

## 2015-03-21 DIAGNOSIS — F4323 Adjustment disorder with mixed anxiety and depressed mood: Secondary | ICD-10-CM | POA: Diagnosis not present

## 2015-03-21 DIAGNOSIS — A0471 Enterocolitis due to Clostridium difficile, recurrent: Secondary | ICD-10-CM

## 2015-03-21 DIAGNOSIS — A047 Enterocolitis due to Clostridium difficile: Secondary | ICD-10-CM

## 2015-03-21 DIAGNOSIS — Z952 Presence of prosthetic heart valve: Secondary | ICD-10-CM

## 2015-03-21 DIAGNOSIS — I10 Essential (primary) hypertension: Secondary | ICD-10-CM | POA: Diagnosis not present

## 2015-03-21 LAB — CBC WITH DIFFERENTIAL/PLATELET
Basophils Absolute: 0.1 10*3/uL (ref 0.0–0.1)
Basophils Relative: 1 % (ref 0–1)
EOS PCT: 3 % (ref 0–5)
Eosinophils Absolute: 0.2 10*3/uL (ref 0.0–0.7)
HEMATOCRIT: 43 % (ref 39.0–52.0)
HEMOGLOBIN: 15 g/dL (ref 13.0–17.0)
LYMPHS ABS: 2.5 10*3/uL (ref 0.7–4.0)
LYMPHS PCT: 48 % — AB (ref 12–46)
MCH: 37.4 pg — AB (ref 26.0–34.0)
MCHC: 34.9 g/dL (ref 30.0–36.0)
MCV: 107.2 fL — AB (ref 78.0–100.0)
MONO ABS: 0.5 10*3/uL (ref 0.1–1.0)
MONOS PCT: 9 % (ref 3–12)
MPV: 10.8 fL (ref 8.6–12.4)
NEUTROS ABS: 2.1 10*3/uL (ref 1.7–7.7)
Neutrophils Relative %: 39 % — ABNORMAL LOW (ref 43–77)
Platelets: 192 10*3/uL (ref 150–400)
RBC: 4.01 MIL/uL — AB (ref 4.22–5.81)
RDW: 14.1 % (ref 11.5–15.5)
WBC: 5.3 10*3/uL (ref 4.0–10.5)

## 2015-03-21 LAB — COMPLETE METABOLIC PANEL WITH GFR
ALBUMIN: 4.2 g/dL (ref 3.6–5.1)
ALT: 10 U/L (ref 9–46)
AST: 15 U/L (ref 10–40)
Alkaline Phosphatase: 80 U/L (ref 40–115)
BUN: 11 mg/dL (ref 7–25)
CALCIUM: 9.2 mg/dL (ref 8.6–10.3)
CO2: 28 mmol/L (ref 20–31)
CREATININE: 1.3 mg/dL (ref 0.60–1.35)
Chloride: 107 mmol/L (ref 98–110)
GFR, EST NON AFRICAN AMERICAN: 65 mL/min (ref >=60)
GFR, Est African American: 75 mL/min (ref >=60)
GLUCOSE: 92 mg/dL (ref 65–99)
Potassium: 4.1 mmol/L (ref 3.5–5.3)
SODIUM: 139 mmol/L (ref 135–146)
TOTAL PROTEIN: 6.8 g/dL (ref 6.1–8.1)
Total Bilirubin: 0.7 mg/dL (ref 0.2–1.2)

## 2015-03-21 MED ORDER — TRAZODONE HCL 50 MG PO TABS: 50.0000 mg | ORAL_TABLET | Freq: Every day | ORAL | Status: DC

## 2015-03-21 MED ORDER — FLUNISOLIDE 25 MCG/ACT (0.025%) NA SOLN: 2.0000 | Freq: Every evening | NASAL | Status: DC | PRN

## 2015-03-21 NOTE — Progress Notes (Signed)
Chief complaint: significant depression and stress related to loss of family members and difficulties at work  Subjective:    Patient ID: Brandon [name], male    DOB: 1965-10-08, 50 y.o.   MRN: [id number]  HPI  Mr Pasha is a highly complicated man with history of  HIV/AIDS and Multi-DRUG RESISTANT virus formerly followed at [facility]. His HIV nadir was  20 when we first met him   He had been on various complicated antiretroviral regimens in the past, with unfortunate GENOTYPIC resistance to all non-nucleoside reverse transcriptase inhibitors and all NRTIs, Resistance to all protease inhibitors with the exception of Prezista which had some activity genotypically,, Resistance to Isentress, and Elvitegravir and 100X reduced S to dolutegravir having both a 148H and 140S  and with Dual tropic virus.  02/12/2010 phenotype at [facility] showed:  RT: NRTI: ABC, DDI, D4T, AZT, TDF: resistant; 3TC, FTC: susceptible; NNRTI: EFV susceptible; RPV, NVP, ETR, DLV: Resistant; PI: pan-resistant  He had decided nto go back onto [facility] and is [facility].  We  Had  seen him and placed him on a  salvage regimen of Prezista 620m  Twice daily boosted with Norvir 1071mtwice daily, Tivicay twice daily, Combivir twice daily and once daily Viread.  And since then he has now an UNDETECTABLE VIRAL LOAD <20 FOR MORE THAN THREE  YEARS  And CD4 count.   Lab Results  Component Value Date   HIV1RNAQUANT <20 12/19/2014   Lab Results  Component Value Date   CD4TABS 290* 12/19/2014   CD4TABS 380* 07/18/2014   CD4TABS 300* 03/31/2014    He came to clinic today thinking that it was only for labs and for appt later this month.  He is under significant stress. He has lost his Father in Law last summer, a younger brother in his 2073scousin in 2070sall unexpected deaths, wifes maternal aunt (expected).  He is under stress at work where he believes that his boss [place]om [employer]oh[place]ri[employer] training. 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 HIV (human immunodeficiency virus infection) ([place]  . Hypertension   . Stroke ([facility]  . Pancreatitis   . Mechanical heart valve present   . Arthritis   . Anemia   . GERD (gastroesophageal reflux disease)   . Abdominal pain   . Weight loss, unintentional   . Constipation   . Nausea & vomiting   . Diarrhea   . 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 GI bleed   . IBS (irritable bowel syndrome)   . Interstitial cystitis   . Infectious colitis   . Gallstones   . Foot lesion 12/19/2014  . Gastric AVM     Past Surgical History  Procedure Laterality Date  . Cardiac surgery    . Knee surgery    . Cholecystectomy  02/12/2012    Procedure: LAPAROSCOPIC CHOLECYSTECTOMY;  Surgeon: FaStark KleinMD;  [place] [facility] Service: General;  Laterality: N/A;  . Esophagogastroduodenoscopy N/A 07/24/2012    Procedure: ESOPHAGOGASTRODUODENOSCOPY (EGD);  Surgeon: PaBeryle BeamsMD;  [facility];  Service: Endoscopy;  Laterality: N/A;  . Aortic valve replacement      Family History  Problem Relation Age of Onset  . Hypertension Father   . Cancer - Prostate Father   .  Cancer Father     stomach  . Hypertension Sister   . Diabetes Maternal Aunt   . Cancer - Other Cousin   . Parkinson's disease Paternal Aunt       Social History   Social History  . Marital Status: Married    Spouse Name: N/A  . Number of Children: 6  . Years of Education: N/A   Occupational History  . disability rep    Social History Main Topics  . Smoking status: Current Every Day Smoker -- 0.10 packs/day for 20 years    Types: Cigars, Cigarettes  . Smokeless tobacco: Never Used     Comment: cutting back  . Alcohol Use: 0.0 oz/week    0 Standard drinks or equivalent per week     Comment: 12oz beer/ per week   . Drug Use:  7.00 per week    Special: Marijuana     Comment: daily 2 joints   . Sexual Activity:    Partners: Female    Patent examiner Protection: Condom   Other Topics Concern  . None   Social History Narrative    Allergies  Allergen Reactions  . Bactrim [Sulfamethoxazole-Trimethoprim]   . Bee Venom Anaphylaxis  . Sulfa Antibiotics Anaphylaxis  . Truvada [Emtricitabine-Tenofovir Df] Anaphylaxis and Rash    Takes plain tenofovir at home  . Lidoderm [Lidocaine] Other (See Comments)    Reaction unknown  . Raltegravir     resistance  . Ceftriaxone Rash  . Sulfamethoxazole Itching, Other (See Comments) and Rash    Other reaction(s): Hypotension (ALLERGY/intolerance)     Current outpatient prescriptions:  .  darunavir (PREZISTA) 600 MG tablet, TAKE 1 TABLET (600 MG TOTAL) BY MOUTH TWO TIMES DAILY WITH A MEAL., Disp: 60 tablet, Rfl: 5 .  dicyclomine (BENTYL) 20 MG tablet, Take 1 tablet (20 mg total) by mouth 4 (four) times daily -  before meals and at bedtime., Disp: 30 tablet, Rfl: 2 .  dolutegravir (TIVICAY) 50 MG tablet, TAKE 1 TABLET (50 MG TOTAL) BY MOUTH 2 (TWO) TIMES DAILY., Disp: 60 tablet, Rfl: 5 .  ENSURE (ENSURE), Take 237 mLs by mouth 2 (two) times daily between meals. Provide a case per month: BMI<20, Disp: 237 mL, Rfl: 6 .  EPINEPHrine (EPI-PEN) 0.3 mg/0.3 mL DEVI, Inject 0.3 mLs (0.3 mg total) into the muscle once., Disp: 1 Device, Rfl: 1 .  escitalopram (LEXAPRO) 20 MG tablet, TAKE 1 TABLET BY MOUTH   DAILY, Disp: 30 tablet, Rfl: 5 .  flunisolide (NASALIDE) 25 MCG/ACT (0.025%) SOLN, Place 2 sprays into the nose at bedtime as needed., Disp: 1 Bottle, Rfl: 0 .  lamiVUDine-zidovudine (COMBIVIR) 150-300 MG tablet, TAKE 1 TABLET BY MOUTH TWO TIMES DAILY., Disp: 60 tablet, Rfl: 5 .  lipase/protease/amylase (CREON) 36000 UNITS CPEP capsule, Take one po with each meal and snack., Disp: 180 capsule, Rfl: 3 .  megestrol (MEGACE) 40 MG/ML suspension, Take 800 mg by mouth daily., Disp: ,  Rfl:  .  NORVIR 100 MG TABS tablet, TAKE 1 TABLET BY MOUTH TWICE A DAY, Disp: 60 tablet, Rfl: 5 .  ondansetron (ZOFRAN ODT) 8 MG disintegrating tablet, Take 1 tablet (8 mg total) by mouth every 8 (eight) hours as needed for nausea or vomiting., Disp: 30 tablet, Rfl: 0 .  prochlorperazine (COMPAZINE) 10 MG tablet, TAKE 1 TABLET BY MOUTH EVERY 6 HOURS AS NEEDED FOR NAUSEA, Disp: 120 tablet, Rfl: 0 .  ranitidine (ZANTAC) 300 MG tablet, Take 1 tablet (300 mg total) by mouth  at bedtime., Disp: 30 tablet, Rfl: 6 .  saccharomyces boulardii (FLORASTOR) 250 MG capsule, Take 1 capsule (250 mg total) by mouth 2 (two) times daily. For 30 days, Disp: 60 capsule, Rfl: 1 .  tenofovir (VIREAD) 300 MG tablet, TAKE 1 TABLET (300 MG TOTAL) BY MOUTH DAILY., Disp: 30 tablet, Rfl: 5 .  valACYclovir (VALTREX) 1000 MG tablet, TAKE 1 TABLET (1,000 MG TOTAL) BY MOUTH DAILY., Disp: 30 tablet, Rfl: 5 .  warfarin (COUMADIN) 1 MG tablet, Take as directed with 52m tablet per [facility], Disp: , Rfl:  .  warfarin (COUMADIN) 5 MG tablet, Take 1 tablet daily and adjust as directed by [facility], Disp: , Rfl:  .  zolpidem (AMBIEN) 10 MG tablet, Take 10 mg by mouth at bedtime. , Disp: , Rfl:  .  traZODone (DESYREL) 50 MG tablet, Take 1 tablet (50 mg total) by mouth at bedtime., Disp: 30 tablet, Rfl: 11 No current facility-administered medications for this visit.  Facility-Administered Medications Ordered in Other Visits:  .  0.9 %  sodium chloride infusion, , Intravenous, Once, JCampbell Riches MD   Review of Systems  Constitutional: Negative for diaphoresis, activity change and unexpected weight change.  HENT: Negative for congestion, rhinorrhea, sinus pressure, sneezing, sore throat and trouble swallowing.   Eyes: Negative for photophobia and visual disturbance.  Respiratory: Negative for chest tightness, shortness of breath, wheezing and stridor.   Cardiovascular: Negative for chest pain, palpitations and leg  swelling.  Gastrointestinal: Negative for abdominal distention and anal bleeding.  Genitourinary: Negative for flank pain.  Musculoskeletal: Negative for back pain, joint swelling and gait problem.  Skin: Negative for rash and wound.  Neurological: Positive for weakness. Negative for tremors.  Hematological: Negative for adenopathy. Does not bruise/bleed easily.  Psychiatric/Behavioral: Positive for behavioral problems, sleep disturbance and dysphoric mood. Negative for suicidal ideas, hallucinations, confusion, self-injury, decreased concentration and agitation. The patient is nervous/anxious. The patient is not hyperactive.        Objective:   Physical Exam  Constitutional: He is oriented to person, place, and time. No distress.  HENT:  Head: Normocephalic and atraumatic.  Mouth/Throat: Oropharynx is clear and moist. No oropharyngeal exudate.  Eyes: Conjunctivae and EOM are normal. No scleral icterus.  Neck: Normal range of motion. Neck supple. No JVD present.  Cardiovascular: Regular rhythm and normal heart sounds.   Pulmonary/Chest: Effort normal. No respiratory distress. He has no wheezes.  Abdominal: Soft. Bowel sounds are normal. He exhibits no distension. There is tenderness in the right upper quadrant. There is no rigidity, no rebound and no guarding.  Musculoskeletal: He exhibits no edema or tenderness.  Lymphadenopathy:    He has no cervical adenopathy.  Neurological: He is alert and oriented to person, place, and time. He displays no atrophy. He exhibits normal muscle tone. Coordination normal.  Skin: Skin is warm and dry. Rash noted. He is not diaphoretic. No erythema. No pallor.  Psychiatric: He has a normal mood and affect. His behavior is normal. Judgment and thought content normal.          Assessment & Plan:    # 1HIV Disease, hx of AIDS,  Highly Resistant Virus. Perfect control and commended him again  Continue:  --Tivicay 548mBID --Prezista 60032mID with   --Norvir 100m64mD --Combivir 1 tablet BID --Viread q daily  HE SHOULD NOT TAKE ENSURE AT THE SAME TIME AS HIS TIVICAY!!!!  We had discussed switch to DESCOVY  And BID AZT instead of Combivir  and Viread but he had told me again that he was allergic to part of Truvada which caused a rash--hard to believe he truly had allergy to Emtricitabine.   Will reconsider in the future  Check labs today   #2 Chronic diarrhea cyclic vomiting: Greatly appreciate GI help here  #3 hx of AV replacement on anticoaguation: followed by PCP  #4 Major Depression, chronic and Recurrent Depression: Continue SSRI and counseling here. He met with Leveda Anna prior to my visit  I am adding trazadone for sleep. I think he needs to see a Psychiatrist for consideration of other meds since he is maxed out on his SSRI already  #5 Insomnia: trial of trazadone again. He has been also on gabanergic drugs for this  I spent greater than 40 minutes with the patient including greater than 50% of time in face to face counsel of the patient re his HIV,  depression grieiviing and in coordination of his care.

## 2015-03-22 LAB — RPR

## 2015-03-22 LAB — MICROALBUMIN / CREATININE URINE RATIO
Creatinine, Urine: 332 mg/dL (ref 20–370)
MICROALB/CREAT RATIO: 7 ug/mg{creat} (ref <30)
Microalb, Ur: 2.3 mg/dL

## 2015-03-22 NOTE — BH Specialist Note (Signed)
Counselor met with patient in exam room today per the request of a nurse that indicated that patient was a little down and not himself. Client was oriented times four with calm affect  and dress.  Patient was alert and somewhat talkative.  Counselor  Inquired with patient how he was doing.  Patient indicated that he was ok but just dreading the next few days as he was anticipating some major changes at his job that he has worked at for 16 years according to the patient.   Counselor communicated to patient about the counseling services available and encouraged patient to allow himself to come and process all that was going on in his life with an objective listener.  Patient agreed that he might benefit from counseling services and made an appointment for next week.  Counselor provided support accordingly.  Lavan Imes, MA, LP[facility]

## 2015-03-23 ENCOUNTER — Other Ambulatory Visit: Payer: Self-pay | Admitting: *Deleted

## 2015-03-23 DIAGNOSIS — R0981 Nasal congestion: Secondary | ICD-10-CM

## 2015-03-23 LAB — T-HELPER CELL (CD4) - (RCID CLINIC ONLY)
CD4 % Helper T Cell: 15 % — ABNORMAL LOW (ref 33–55)
CD4 T CELL ABS: 390 /uL — AB (ref 400–2700)

## 2015-03-25 LAB — HIV RNA, RTPCR W/R GT (RTI, PI,INT)

## 2015-03-29 ENCOUNTER — Telehealth: Payer: Self-pay | Admitting: *Deleted

## 2015-03-29 NOTE — Telephone Encounter (Signed)
Prior authorization for Flunisolide faxed to [facility] tracks and confirmation received. Waiting on decision of approval or denial. Myrtis Hopping

## 2015-04-04 ENCOUNTER — Encounter: Payer: Self-pay | Admitting: Infectious Disease

## 2015-04-04 ENCOUNTER — Ambulatory Visit (INDEPENDENT_AMBULATORY_CARE_PROVIDER_SITE_OTHER): Payer: Medicaid Other | Admitting: Infectious Disease

## 2015-04-04 ENCOUNTER — Ambulatory Visit: Payer: Medicaid Other | Admitting: *Deleted

## 2015-04-04 VITALS — BP 149/75 | HR 85 | Temp 99.0°F | Wt 126.0 lb

## 2015-04-04 DIAGNOSIS — I10 Essential (primary) hypertension: Secondary | ICD-10-CM

## 2015-04-04 DIAGNOSIS — F339 Major depressive disorder, recurrent, unspecified: Secondary | ICD-10-CM

## 2015-04-04 DIAGNOSIS — K861 Other chronic pancreatitis: Secondary | ICD-10-CM

## 2015-04-04 DIAGNOSIS — B20 Human immunodeficiency virus [HIV] disease: Secondary | ICD-10-CM

## 2015-04-04 DIAGNOSIS — Z954 Presence of other heart-valve replacement: Secondary | ICD-10-CM | POA: Diagnosis present

## 2015-04-04 DIAGNOSIS — Z952 Presence of prosthetic heart valve: Secondary | ICD-10-CM

## 2015-04-04 DIAGNOSIS — F411 Generalized anxiety disorder: Secondary | ICD-10-CM

## 2015-04-04 DIAGNOSIS — F32A Depression, unspecified: Secondary | ICD-10-CM

## 2015-04-04 DIAGNOSIS — F329 Major depressive disorder, single episode, unspecified: Secondary | ICD-10-CM | POA: Diagnosis not present

## 2015-04-04 NOTE — BH Specialist Note (Signed)
Brandon [name] requested to meet with counselor today in the exam room.  Patient was oriented times four with good affect and dress.  Patient shared that he was still having a difficult time at his job and with finances in general. Counselor discussed with patient different options for addressing his financial problems.  Patient indicated that he may need to

## 2015-04-04 NOTE — Patient Instructions (Signed)
Peer Support Group  10 am on 2nd and 4th Fridays of every month  1st and 3rd Tuesdays at 6pm starting in February  Peer Support Contact is Wes @ [phone]

## 2015-04-04 NOTE — Progress Notes (Signed)
Chief complaint: difficulty with anger at work  Subjective:    Patient ID: Brandon [name], male    DOB: 1966/02/26, 50 y.o.   MRN: [id number]  HPI   Brandon [name] is a highly complicated man with history of  HIV/AIDS and Multi-DRUG RESISTANT virus formerly followed at [facility]. His HIV nadir was  20 when we first met him   He had been on various complicated antiretroviral regimens in the past, with unfortunate GENOTYPIC resistance to all non-nucleoside reverse transcriptase inhibitors and all NRTIs, Resistance to all protease inhibitors with the exception of Prezista which had some activity genotypically,, Resistance to Isentress, and Elvitegravir and 100X reduced S to dolutegravir having both a 148H and 140S  and with Dual tropic virus.  02/12/2010 phenotype at [facility] showed:  RT: NRTI: ABC, DDI, D4T, AZT, TDF: resistant; 3TC, FTC: susceptible; NNRTI: EFV susceptible; RPV, NVP, ETR, DLV: Resistant; PI: pan-resistant  He had decided nto go back onto [facility] and is referred to [facility].  We  Had  seen him and placed him on a  salvage regimen of Prezista 625m  Twice daily boosted with Norvir 1065mtwice daily, Tivicay twice daily, Combivir twice daily and once daily Viread.  And since then he has now an UNDETECTABLE VIRAL LOAD <20 FOR MORE THAN THREE  YEARS  And CD4 count.   Lab Results  Component Value Date   HIV1RNAQUANT <20 03/21/2015   Lab Results  Component Value Date   CD4TABS 390* 03/21/2015   CD4TABS 290* 12/19/2014   CD4TABS 380* 07/18/2014    He came to clinic today roughly two weeks ago under significant stress related to his work as outlined in my last note and in Jodi's notes. He told me he is on verge of having outburst at work but has been able to keep himself under control   Past Medical History  Diagnosis Date  . HIV (human immunodeficiency virus infection) ([place]  . Hypertension   . Stroke ([place]  . Pancreatitis   . Mechanical heart valve present   .  Arthritis   . Anemia   . GERD (gastroesophageal reflux disease)   . Abdominal pain   . Weight loss, unintentional   . Constipation   . Nausea & vomiting   . Diarrhea   . 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 GI bleed   . IBS (irritable bowel syndrome)   . Interstitial cystitis   . Infectious colitis   . Gallstones   . Foot lesion 12/19/2014  . Gastric AVM     Past Surgical History  Procedure Laterality Date  . Cardiac surgery    . Knee surgery    . Cholecystectomy  02/12/2012    Procedure: LAPAROSCOPIC CHOLECYSTECTOMY;  Surgeon: FaStark KleinMD;  [facility] Service: General;  Laterality: N/A;  . Esophagogastroduodenoscopy N/A 07/24/2012    Procedure: ESOPHAGOGASTRODUODENOSCOPY (EGD);  Surgeon: PaBeryle BeamsMD;  [facility];  Service: Endoscopy;  Laterality: N/A;  . Aortic valve replacement      Family History  Problem Relation Age of Onset  . Hypertension Father   . Cancer - Prostate Father   . Cancer Father     stomach  . Hypertension Sister   . Diabetes Maternal Aunt   . Cancer - Other Cousin   . Parkinson's disease Paternal Aunt       Social History   Social History  . Marital Status: Married    Spouse Name: N/A  .  Number of Children: 6  . Years of Education: N/A   Occupational History  . disability rep    Social History Main Topics  . Smoking status: Current Every Day Smoker -- 0.10 packs/day for 20 years    Types: Cigars, Cigarettes  . Smokeless tobacco: Never Used     Comment: cutting back  . Alcohol Use: 0.0 oz/week    0 Standard drinks or equivalent per week     Comment: 12oz beer/ per week   . Drug Use: 7.00 per week    Special: Marijuana     Comment: daily 2 joints   . Sexual Activity:    Partners: Female    Patent examiner Protection: Condom   Other Topics Concern  . None   Social History Narrative    Allergies  Allergen Reactions  . Bactrim [Sulfamethoxazole-Trimethoprim]   . Bee Venom  Anaphylaxis  . Sulfa Antibiotics Anaphylaxis  . Truvada [Emtricitabine-Tenofovir Df] Anaphylaxis and Rash    Takes plain tenofovir at home  . Lidoderm [Lidocaine] Other (See Comments)    Reaction unknown  . Raltegravir     resistance  . Ceftriaxone Rash  . Sulfamethoxazole Itching, Other (See Comments) and Rash    Other reaction(s): Hypotension (ALLERGY/intolerance)     Current outpatient prescriptions:  .  darunavir (PREZISTA) 600 MG tablet, TAKE 1 TABLET (600 MG TOTAL) BY MOUTH TWO TIMES DAILY WITH A MEAL., Disp: 60 tablet, Rfl: 5 .  dicyclomine (BENTYL) 20 MG tablet, Take 1 tablet (20 mg total) by mouth 4 (four) times daily -  before meals and at bedtime., Disp: 30 tablet, Rfl: 2 .  dolutegravir (TIVICAY) 50 MG tablet, TAKE 1 TABLET (50 MG TOTAL) BY MOUTH 2 (TWO) TIMES DAILY., Disp: 60 tablet, Rfl: 5 .  ENSURE (ENSURE), Take 237 mLs by mouth 2 (two) times daily between meals. Provide a case per month: BMI<20, Disp: 237 mL, Rfl: 6 .  EPINEPHrine (EPI-PEN) 0.3 mg/0.3 mL DEVI, Inject 0.3 mLs (0.3 mg total) into the muscle once., Disp: 1 Device, Rfl: 1 .  escitalopram (LEXAPRO) 20 MG tablet, TAKE 1 TABLET BY MOUTH   DAILY, Disp: 30 tablet, Rfl: 5 .  flunisolide (NASALIDE) 25 MCG/ACT (0.025%) SOLN, Place 2 sprays into the nose at bedtime as needed., Disp: 1 Bottle, Rfl: 0 .  lamiVUDine-zidovudine (COMBIVIR) 150-300 MG tablet, TAKE 1 TABLET BY MOUTH TWO TIMES DAILY., Disp: 60 tablet, Rfl: 5 .  lipase/protease/amylase (CREON) 36000 UNITS CPEP capsule, Take one po with each meal and snack., Disp: 180 capsule, Rfl: 3 .  megestrol (MEGACE) 40 MG/ML suspension, Take 800 mg by mouth daily., Disp: , Rfl:  .  NORVIR 100 MG TABS tablet, TAKE 1 TABLET BY MOUTH TWICE A DAY, Disp: 60 tablet, Rfl: 5 .  ondansetron (ZOFRAN ODT) 8 MG disintegrating tablet, Take 1 tablet (8 mg total) by mouth every 8 (eight) hours as needed for nausea or vomiting., Disp: 30 tablet, Rfl: 0 .  prochlorperazine (COMPAZINE) 10  MG tablet, TAKE 1 TABLET BY MOUTH EVERY 6 HOURS AS NEEDED FOR NAUSEA, Disp: 120 tablet, Rfl: 0 .  ranitidine (ZANTAC) 300 MG tablet, Take 1 tablet (300 mg total) by mouth at bedtime., Disp: 30 tablet, Rfl: 6 .  saccharomyces boulardii (FLORASTOR) 250 MG capsule, Take 1 capsule (250 mg total) by mouth 2 (two) times daily. For 30 days, Disp: 60 capsule, Rfl: 1 .  tenofovir (VIREAD) 300 MG tablet, TAKE 1 TABLET (300 MG TOTAL) BY MOUTH DAILY., Disp: 30 tablet, Rfl: 5 .  traZODone (DESYREL) 50 MG tablet, Take 1 tablet (50 mg total) by mouth at bedtime., Disp: 30 tablet, Rfl: 11 .  valACYclovir (VALTREX) 1000 MG tablet, TAKE 1 TABLET (1,000 MG TOTAL) BY MOUTH DAILY., Disp: 30 tablet, Rfl: 5 .  warfarin (COUMADIN) 1 MG tablet, Take as directed with 33m tablet per [facility], Disp: , Rfl:  .  zolpidem (AMBIEN) 10 MG tablet, Take 10 mg by mouth at bedtime. , Disp: , Rfl:  No current facility-administered medications for this visit.  Facility-Administered Medications Ordered in Other Visits:  .  0.9 %  sodium chloride infusion, , Intravenous, Once, JCampbell Riches MD   Review of Systems  Constitutional: Negative for diaphoresis, activity change and unexpected weight change.  HENT: Negative for congestion, rhinorrhea, sinus pressure, sneezing, sore throat and trouble swallowing.   Eyes: Negative for photophobia and visual disturbance.  Respiratory: Negative for chest tightness, shortness of breath, wheezing and stridor.   Cardiovascular: Negative for chest pain, palpitations and leg swelling.  Gastrointestinal: Negative for abdominal distention and anal bleeding.  Genitourinary: Negative for flank pain.  Musculoskeletal: Negative for back pain, joint swelling and gait problem.  Skin: Negative for rash and wound.  Neurological: Negative for tremors.  Hematological: Negative for adenopathy. Does not bruise/bleed easily.  Psychiatric/Behavioral: Positive for behavioral problems, sleep disturbance  and dysphoric mood. Negative for suicidal ideas, hallucinations, confusion, self-injury, decreased concentration and agitation. The patient is nervous/anxious. The patient is not hyperactive.        Objective:   Physical Exam  Constitutional: He is oriented to person, place, and time. No distress.  HENT:  Head: Normocephalic and atraumatic.  Mouth/Throat: Oropharynx is clear and moist. No oropharyngeal exudate.  Eyes: Conjunctivae and EOM are normal. No scleral icterus.  Neck: Normal range of motion. Neck supple. No JVD present.  Cardiovascular: Regular rhythm and normal heart sounds.   Pulmonary/Chest: Effort normal. No respiratory distress. He has no wheezes.  Abdominal: Soft. Bowel sounds are normal. He exhibits no distension. There is tenderness in the right upper quadrant. There is no rigidity, no rebound and no guarding.  Musculoskeletal: He exhibits no edema or tenderness.  Lymphadenopathy:    He has no cervical adenopathy.  Neurological: He is alert and oriented to person, place, and time. He displays no atrophy. He exhibits normal muscle tone. Coordination normal.  Skin: Skin is warm and dry. Rash noted. He is not diaphoretic. No erythema. No pallor.  Psychiatric: His behavior is normal. Judgment and thought content normal. He exhibits a depressed mood.          Assessment & Plan:    # 1HIV Disease, hx of AIDS,  Highly Resistant Virus. Perfect control yet again  Continue:  --Tivicay 536mBID --Prezista 60057mID with  --Norvir 100m30mD --Combivir 1 tablet BID --Viread q daily  HE SHOULD NOT TAKE ENSURE AT THE SAME TIME AS HIS TIVICAY!!!!  We had discussed switch to DESCOVY  And BID AZT instead of Combivir and Viread but he had told me again that he was allergic to part of Truvada which caused a rash--hard to believe he truly had allergy to Emtricitabine.  We discussed substituting new version of TAF by itself to swap in for Viread though I do not know if it is FDA  approved for HIV rx in this form yet--it should be    #2 Chronic diarrhea cyclic vomiting: Greatly appreciate GI help here  #3 hx of AV replacement on anticoaguation: followed by PCP  #  4 Major Depression, chronic and Recurrent Depression: Continue SSRI and counseling here. He met with Leveda Anna again today.  I am adding trazadone for sleep. I think he needs to see a Psychiatrist for consideration of other meds since he is maxed out on his SSRI already  #5 Insomnia: trial of trazadone again. He has been also on gabanergic drugs for this  I spent greater than 25 minutes with the patient including greater than 50% of time in face to face counsel of the patient re his HIV,  depression grieiviing and in coordination of his care.

## 2015-04-06 ENCOUNTER — Other Ambulatory Visit: Payer: Self-pay | Admitting: Infectious Disease

## 2015-04-06 DIAGNOSIS — B20 Human immunodeficiency virus [HIV] disease: Secondary | ICD-10-CM

## 2015-05-11 ENCOUNTER — Telehealth: Payer: Self-pay | Admitting: Internal Medicine

## 2015-05-12 MED ORDER — RANITIDINE HCL 300 MG PO TABS: 300.0000 mg | ORAL_TABLET | Freq: Every day | ORAL | Status: DC

## 2015-05-12 NOTE — Telephone Encounter (Signed)
refilled Zantac

## 2015-05-23 ENCOUNTER — Telehealth: Payer: Self-pay | Admitting: *Deleted

## 2015-05-23 NOTE — Telephone Encounter (Signed)
PA completed and faxed to Medicaid for Flunisolide rx.

## 2015-05-23 NOTE — Telephone Encounter (Signed)
Thanks so much. 

## 2015-07-03 ENCOUNTER — Emergency Department (HOSPITAL_COMMUNITY): Payer: Medicaid Other

## 2015-07-03 ENCOUNTER — Other Ambulatory Visit: Payer: Medicaid Other

## 2015-07-03 ENCOUNTER — Encounter (HOSPITAL_COMMUNITY): Payer: Self-pay | Admitting: Emergency Medicine

## 2015-07-03 ENCOUNTER — Emergency Department (HOSPITAL_COMMUNITY)
Admission: EM | Admit: 2015-07-03 | Discharge: 2015-07-03 | Disposition: A | Discharge status: Home / Self Care | Payer: Medicaid Other | Attending: Emergency Medicine | Admitting: Emergency Medicine

## 2015-07-03 DIAGNOSIS — Z862 Personal history of diseases of the blood and blood-forming organs and certain disorders involving the immune mechanism: Secondary | ICD-10-CM | POA: Diagnosis not present

## 2015-07-03 DIAGNOSIS — F17210 Nicotine dependence, cigarettes, uncomplicated: Secondary | ICD-10-CM | POA: Diagnosis not present

## 2015-07-03 DIAGNOSIS — K219 Gastro-esophageal reflux disease without esophagitis: Secondary | ICD-10-CM | POA: Diagnosis not present

## 2015-07-03 DIAGNOSIS — Z954 Presence of other heart-valve replacement: Secondary | ICD-10-CM | POA: Insufficient documentation

## 2015-07-03 DIAGNOSIS — Z7901 Long term (current) use of anticoagulants: Secondary | ICD-10-CM | POA: Diagnosis not present

## 2015-07-03 DIAGNOSIS — M1990 Unspecified osteoarthritis, unspecified site: Secondary | ICD-10-CM | POA: Diagnosis not present

## 2015-07-03 DIAGNOSIS — Q2733 Arteriovenous malformation of digestive system vessel: Secondary | ICD-10-CM | POA: Insufficient documentation

## 2015-07-03 DIAGNOSIS — I10 Essential (primary) hypertension: Secondary | ICD-10-CM | POA: Diagnosis not present

## 2015-07-03 DIAGNOSIS — R197 Diarrhea, unspecified: Secondary | ICD-10-CM

## 2015-07-03 DIAGNOSIS — G8929 Other chronic pain: Secondary | ICD-10-CM | POA: Diagnosis not present

## 2015-07-03 DIAGNOSIS — Z872 Personal history of diseases of the skin and subcutaneous tissue: Secondary | ICD-10-CM | POA: Diagnosis not present

## 2015-07-03 DIAGNOSIS — R112 Nausea with vomiting, unspecified: Secondary | ICD-10-CM

## 2015-07-03 DIAGNOSIS — Z8619 Personal history of other infectious and parasitic diseases: Secondary | ICD-10-CM | POA: Diagnosis not present

## 2015-07-03 DIAGNOSIS — K580 Irritable bowel syndrome with diarrhea: Secondary | ICD-10-CM | POA: Insufficient documentation

## 2015-07-03 DIAGNOSIS — B20 Human immunodeficiency virus [HIV] disease: Secondary | ICD-10-CM | POA: Diagnosis not present

## 2015-07-03 DIAGNOSIS — R109 Unspecified abdominal pain: Secondary | ICD-10-CM

## 2015-07-03 DIAGNOSIS — Z8673 Personal history of transient ischemic attack (TIA), and cerebral infarction without residual deficits: Secondary | ICD-10-CM | POA: Insufficient documentation

## 2015-07-03 DIAGNOSIS — R1013 Epigastric pain: Secondary | ICD-10-CM | POA: Insufficient documentation

## 2015-07-03 DIAGNOSIS — Z79899 Other long term (current) drug therapy: Secondary | ICD-10-CM | POA: Diagnosis not present

## 2015-07-03 DIAGNOSIS — Z87448 Personal history of other diseases of urinary system: Secondary | ICD-10-CM | POA: Diagnosis not present

## 2015-07-03 LAB — URINALYSIS, ROUTINE W REFLEX MICROSCOPIC
Bilirubin Urine: NEGATIVE
Glucose, UA: >1000 mg/dL — AB
Ketones, ur: NEGATIVE mg/dL
LEUKOCYTES UA: NEGATIVE
NITRITE: NEGATIVE
PROTEIN: 100 mg/dL — AB
Specific Gravity, Urine: 1.024 (ref 1.005–1.030)
pH: 5.5 (ref 5.0–8.0)

## 2015-07-03 LAB — COMPREHENSIVE METABOLIC PANEL
ALK PHOS: 85 U/L (ref 38–126)
ALT: 17 U/L (ref 17–63)
ANION GAP: 9 (ref 5–15)
AST: 24 U/L (ref 15–41)
Albumin: 4.9 g/dL (ref 3.5–5.0)
BUN: 13 mg/dL (ref 6–20)
CALCIUM: 9.5 mg/dL (ref 8.9–10.3)
CO2: 24 mmol/L (ref 22–32)
CREATININE: 1.31 mg/dL — AB (ref 0.61–1.24)
Chloride: 105 mmol/L (ref 101–111)
Glucose, Bld: 199 mg/dL — ABNORMAL HIGH (ref 65–99)
Potassium: 4.2 mmol/L (ref 3.5–5.1)
Sodium: 138 mmol/L (ref 135–145)
Total Bilirubin: 0.6 mg/dL (ref 0.3–1.2)
Total Protein: 8 g/dL (ref 6.5–8.1)

## 2015-07-03 LAB — CBC
HCT: 41.9 % (ref 39.0–52.0)
Hemoglobin: 15.1 g/dL (ref 13.0–17.0)
MCH: 37.2 pg — ABNORMAL HIGH (ref 26.0–34.0)
MCHC: 36 g/dL (ref 30.0–36.0)
MCV: 103.2 fL — ABNORMAL HIGH (ref 78.0–100.0)
PLATELETS: 162 10*3/uL (ref 150–400)
RBC: 4.06 MIL/uL — AB (ref 4.22–5.81)
RDW: 14 % (ref 11.5–15.5)
WBC: 6 10*3/uL (ref 4.0–10.5)

## 2015-07-03 LAB — PROTIME-INR
INR: 3.51 — AB (ref 0.00–1.49)
PROTHROMBIN TIME: 34.4 s — AB (ref 11.6–15.2)

## 2015-07-03 LAB — URINE MICROSCOPIC-ADD ON

## 2015-07-03 LAB — LIPASE, BLOOD: LIPASE: 20 U/L (ref 11–51)

## 2015-07-03 MED ORDER — DICYCLOMINE HCL 10 MG/ML IM SOLN
20.0000 mg | Freq: Once | INTRAMUSCULAR | Status: AC
  Administered 2015-07-03: 20 mg via INTRAMUSCULAR
  Filled 2015-07-03: qty 2

## 2015-07-03 MED ORDER — ONDANSETRON HCL 4 MG/2ML IJ SOLN
4.0000 mg | INTRAMUSCULAR | Status: AC | PRN
Start: 2015-07-03 — End: 2015-07-03
  Administered 2015-07-03 (×2): 4 mg via INTRAVENOUS
  Filled 2015-07-03 (×2): qty 2

## 2015-07-03 MED ORDER — ONDANSETRON 4 MG PO TBDP
4.0000 mg | ORAL_TABLET | Freq: Once | ORAL | Status: AC | PRN
  Administered 2015-07-03: 4 mg via ORAL
  Filled 2015-07-03: qty 1

## 2015-07-03 MED ORDER — DICYCLOMINE HCL 20 MG PO TABS: 20.0000 mg | ORAL_TABLET | Freq: Four times a day (QID) | ORAL | Status: DC | PRN

## 2015-07-03 MED ORDER — FENTANYL CITRATE (PF) 100 MCG/2ML IJ SOLN: 50.0000 ug | INTRAMUSCULAR | Status: DC | PRN

## 2015-07-03 MED ORDER — ONDANSETRON HCL 4 MG PO TABS: 4.0000 mg | ORAL_TABLET | Freq: Three times a day (TID) | ORAL | Status: DC | PRN

## 2015-07-03 MED ORDER — FAMOTIDINE IN NACL 20-0.9 MG/50ML-% IV SOLN
20.0000 mg | Freq: Once | INTRAVENOUS | Status: AC
  Administered 2015-07-03: 20 mg via INTRAVENOUS
  Filled 2015-07-03: qty 50

## 2015-07-03 MED ORDER — SODIUM CHLORIDE 0.9 % IV BOLUS (SEPSIS)
2000.0000 mL | Freq: Once | INTRAVENOUS | Status: AC
  Administered 2015-07-03: 2000 mL via INTRAVENOUS

## 2015-07-03 NOTE — ED Notes (Signed)
Pt c/o generalized abdominal pain, nausea, and vomiting since yesterday. Believes it's his "pancreatitis or gastritis" acting up. Actively vomiting in triage.

## 2015-07-03 NOTE — Discharge Instructions (Signed)
Eat a bland diet, avoiding greasy, fatty, fried foods, as well as spicy and acidic foods or beverages.  Avoid eating within the hour or 2 before going to bed or laying down.  Also avoid teas, colas, coffee, chocolate, pepermint and spearment.  Take over the counter maalox/mylanta, as directed on packaging, as needed for discomfort.  Take the prescriptions as directed.  Call your regular GI doctor tomorrow morning to schedule a follow up appointment in the next 2 days. Return to the Emergency Department immediately if worsening.

## 2015-07-03 NOTE — ED Provider Notes (Signed)
CSN: [id number]     Arrival date & time 07/03/15  1453 History   First MD Initiated Contact with Patient 07/03/15 1920     Chief Complaint  Patient presents with  . Abdominal Pain  . Emesis  . Nausea     HPI Pt was seen at [place].  Per pt, c/o gradual onset and persistence of constant acute flair of his chronic upper abd "pain" since yesterday.  Has been associated with multiple intermittent episodes of N/V/D. States he "needs some IV fluids, some IV zofran and IV pain medicine."  Denies fevers, no back pain, no rash, no CP/SOB, no black or blood in stools or emesis. The symptoms have been associated with no other complaints. The patient has a significant history of similar symptoms previously, recently being evaluated for this complaint and multiple prior evals for same.      Past Medical History  Diagnosis Date  . HIV (human immunodeficiency virus infection) ([place])   . Hypertension   . Stroke ([place])   . Pancreatitis   . Mechanical heart valve present   . Arthritis   . Anemia   . GERD (gastroesophageal reflux disease)   . Abdominal pain   . Weight loss, unintentional   . Constipation   . Nausea & vomiting   . Diarrhea   . 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 GI bleed   . IBS (irritable bowel syndrome)   . Interstitial cystitis   . Infectious colitis   . Gallstones   . Foot lesion 12/19/2014  . Gastric AVM    Past Surgical History  Procedure Laterality Date  . Cardiac surgery    . Knee surgery    . Cholecystectomy  02/12/2012    Procedure: LAPAROSCOPIC CHOLECYSTECTOMY;  Surgeon: Stark Klein, MD;  Location: [place];  Service: General;  Laterality: N/A;  . Esophagogastroduodenoscopy N/A 07/24/2012    Procedure: ESOPHAGOGASTRODUODENOSCOPY (EGD);  Surgeon: Beryle Beams, MD;  Location[facility] ENDOSCOPY;  Service: Endoscopy;  Laterality: N/A;  . Aortic valve replacement     Family History  Problem Relation Age of Onset  . Hypertension Father   . Cancer - Prostate  Father   . Cancer Father     stomach  . Hypertension Sister   . Diabetes Maternal Aunt   . Cancer - Other Cousin   . Parkinson's disease Paternal Aunt    Social History  Substance Use Topics  . Smoking status: Current Every Day Smoker -- 0.10 packs/day for 20 years    Types: Cigars, Cigarettes  . Smokeless tobacco: Never Used     Comment: cutting back  . Alcohol Use: 0.0 oz/week    0 Standard drinks or equivalent per week     Comment: 12oz beer/ per week     Review of Systems ROS: Statement: All systems negative except as marked or noted in the HPI; Constitutional: Negative for fever and chills. ; ; Eyes: Negative for eye pain, redness and discharge. ; ; ENMT: Negative for ear pain, hoarseness, nasal congestion, sinus pressure and sore throat. ; ; Cardiovascular: Negative for chest pain, palpitations, diaphoresis, dyspnea and peripheral edema. ; ; Respiratory: Negative for cough, wheezing and stridor. ; ; Gastrointestinal: +N/V/D, abd pain. Negative for blood in stool, hematemesis, jaundice and rectal bleeding. . ; ; Genitourinary: Negative for dysuria, flank pain and hematuria. ; ; Musculoskeletal: Negative for back pain and neck pain. Negative for swelling and trauma.; ; Skin: Negative for pruritus, rash, abrasions, blisters, bruising and skin lesion.; ;  Neuro: Negative for headache, lightheadedness and neck stiffness. Negative for weakness, altered level of consciousness , altered mental status, extremity weakness, paresthesias, involuntary movement, seizure and syncope.      Allergies  Bactrim; Bee venom; Sulfa antibiotics; Truvada; Lidoderm; Raltegravir; Ceftriaxone; and Sulfamethoxazole  Home Medications   Prior to Admission medications   Medication Sig Start Date End Date Taking? Authorizing Provider  dicyclomine (BENTYL) 20 MG tablet Take 1 tablet (20 mg total) by mouth 4 (four) times daily -  before meals and at bedtime. 01/12/15   Irene Shipper, MD  ENSURE (ENSURE) Take 237  mLs by mouth 2 (two) times daily between meals. Provide a case per month: BMI<20 07/19/13   Truman Hayward, MD  EPINEPHrine (EPI-PEN) 0.3 mg/0.3 mL DEVI Inject 0.3 mLs (0.3 mg total) into the muscle once. 08/20/12   Truman Hayward, MD  escitalopram (LEXAPRO) 20 MG tablet TAKE 1 TABLET BY MOUTH   DAILY 02/07/15   Truman Hayward, MD  flunisolide (NASALIDE) 25 MCG/ACT (0.025%) SOLN Place 2 sprays into the nose at bedtime as needed. 03/21/15   Truman Hayward, MD  lamiVUDine-zidovudine (COMBIVIR) 150-300 MG tablet TAKE 1 TABLET BY MOUTH TWO TIMES DAILY. 04/06/15   Truman Hayward, MD  lipase/protease/amylase (CREON) 36000 UNITS CPEP capsule Take one po with each meal and snack. 10/20/14   Lori P Hvozdovic, PA-C  megestrol (MEGACE) 40 MG/ML suspension Take 800 mg by mouth daily.    Historical Provider, MD  NORVIR 100 MG TABS tablet TAKE 1 TABLET BY MOUTH TWICE A DAY 02/07/15   Truman Hayward, MD  ondansetron (ZOFRAN ODT) 8 MG disintegrating tablet Take 1 tablet (8 mg total) by mouth every 8 (eight) hours as needed for nausea or vomiting. 09/16/14   Kelly Humes, PA-C  PREZISTA 600 MG tablet TAKE 1 TABLET (600 MG TOTAL) BY MOUTH TWO TIMES DAILY WITH A MEAL. 04/06/15   Truman Hayward, MD  prochlorperazine (COMPAZINE) 10 MG tablet TAKE 1 TABLET BY MOUTH EVERY 6 HOURS AS NEEDED FOR NAUSEA 10/17/14   Truman Hayward, MD  ranitidine (ZANTAC) 300 MG tablet Take 1 tablet (300 mg total) by mouth at bedtime. 05/12/15   Irene Shipper, MD  saccharomyces boulardii (FLORASTOR) 250 MG capsule Take 1 capsule (250 mg total) by mouth 2 (two) times daily. For 30 days 11/23/14   Lori P Hvozdovic, PA-C  TIVICAY 50 MG tablet TAKE 1 TABLET (50 MG TOTAL) BY MOUTH TWO TIMES DAILY. 04/06/15   Truman Hayward, MD  traZODone (DESYREL) 50 MG tablet Take 1 tablet (50 mg total) by mouth at bedtime. 03/21/15   Truman Hayward, MD  valACYclovir (VALTREX) 1000 MG tablet TAKE 1 TABLET (1,000 MG TOTAL) BY MOUTH  DAILY. 04/06/15   Truman Hayward, MD  VIREAD 300 MG tablet TAKE 1 TABLET (300 MG TOTAL) BY MOUTH DAILY. 04/06/15   Truman Hayward, MD  warfarin (COUMADIN) 1 MG tablet Take as directed with 5mg  tablet per [facility] 03/24/14   Historical Provider, MD  zolpidem (AMBIEN) 10 MG tablet Take 10 mg by mouth at bedtime.  03/05/12   Historical Provider, MD   BP 127/77 mmHg  Pulse 87  Temp(Src) 97.6 F (36.4 C) (Oral)  Resp 16  SpO2 100% Physical Exam  1925: Physical examination:  Nursing notes reviewed; Vital signs and O2 SAT reviewed;  Constitutional: Well developed, Well nourished, Well hydrated, In  no acute distress; Head:  Normocephalic, atraumatic; Eyes: EOMI, PERRL, No scleral icterus; ENMT: Mouth and pharynx normal, Mucous membranes moist; Neck: Supple, Full range of motion, No lymphadenopathy; Cardiovascular: Regular rate and rhythm, No gallop; Respiratory: Breath sounds clear & equal bilaterally, No wheezes.  Speaking full sentences with ease, Normal respiratory effort/excursion; Chest: Nontender, Movement normal; Abdomen: Soft, +mid-epigastric tenderness to palp. No rebound or guarding. Nondistended, Normal bowel sounds; Genitourinary: No CVA tenderness; Extremities: Pulses normal, No tenderness, No edema, No calf edema or asymmetry.; Neuro: AA&Ox3, Major CN grossly intact.  Speech clear. No gross focal motor or sensory deficits in extremities.; Skin: Color normal, Warm, Dry.   ED Course  Procedures (including critical care time) Labs Review  Imaging Review  I have personally reviewed and evaluated these images and lab results as part of my medical decision-making.   EKG Interpretation None      MDM  MDM Reviewed: previous chart, nursing note and vitals Reviewed previous: labs Interpretation: labs and x-ray     Results for orders placed or performed during the hospital encounter of 07/03/15  Lipase, blood  Result Value Ref Range   Lipase 20 11 - 51 U/L   Comprehensive metabolic panel  Result Value Ref Range   Sodium 138 135 - 145 mmol/L   Potassium 4.2 3.5 - 5.1 mmol/L   Chloride 105 101 - 111 mmol/L   CO2 24 22 - 32 mmol/L   Glucose, Bld 199 (H) 65 - 99 mg/dL   BUN 13 6 - 20 mg/dL   Creatinine, Ser 1.31 (H) 0.61 - 1.24 mg/dL   Calcium 9.5 8.9 - 10.3 mg/dL   Total Protein 8.0 6.5 - 8.1 g/dL   Albumin 4.9 3.5 - 5.0 g/dL   AST 24 15 - 41 U/L   ALT 17 17 - 63 U/L   Alkaline Phosphatase 85 38 - 126 U/L   Total Bilirubin 0.6 0.3 - 1.2 mg/dL   GFR calc non Af Amer >60 >60 mL/min   GFR calc Af Amer >60 >60 mL/min   Anion gap 9 5 - 15  CBC  Result Value Ref Range   WBC 6.0 4.0 - 10.5 K/uL   RBC 4.06 (L) 4.22 - 5.81 MIL/uL   Hemoglobin 15.1 13.0 - 17.0 g/dL   HCT 41.9 39.0 - 52.0 %   MCV 103.2 (H) 78.0 - 100.0 fL   MCH 37.2 (H) 26.0 - 34.0 pg   MCHC 36.0 30.0 - 36.0 g/dL   RDW 14.0 11.5 - 15.5 %   Platelets 162 150 - 400 K/uL  Urinalysis, Routine w reflex microscopic (not at [facility])  Result Value Ref Range   Color, Urine YELLOW YELLOW   APPearance CLEAR CLEAR   Specific Gravity, Urine 1.024 1.005 - 1.030   pH 5.5 5.0 - 8.0   Glucose, UA >1000 (A) NEGATIVE mg/dL   Hgb urine dipstick MODERATE (A) NEGATIVE   Bilirubin Urine NEGATIVE NEGATIVE   Ketones, ur NEGATIVE NEGATIVE mg/dL   Protein, ur 100 (A) NEGATIVE mg/dL   Nitrite NEGATIVE NEGATIVE   Leukocytes, UA NEGATIVE NEGATIVE  Protime-INR  Result Value Ref Range   Prothrombin Time 34.4 (H) 11.6 - 15.2 seconds   INR 3.51 (H) 0.00 - 1.49  Urine microscopic-add on  Result Value Ref Range   Squamous Epithelial / LPF 0-5 (A) NONE SEEN   WBC, UA 0-5 0 - 5 WBC/hpf   RBC / HPF 6-30 0 - 5 RBC/hpf   Bacteria, UA FEW (A) NONE  SEEN   Urine-Other MUCOUS PRESENT    Dg Abd Acute W/chest 07/03/2015  CLINICAL DATA:  Diffuse abdominal pain, nausea and vomiting since yesterday. EXAM: DG ABDOMEN ACUTE W/ 1V CHEST COMPARISON:  08/24/2013. FINDINGS: Normal sized heart. Clear lungs. The lungs  remain hyperexpanded. Median sternotomy wires and prosthetic aortic valve. Normal bowel gas pattern without free peritoneal air. Cholecystectomy clips. Unremarkable bones. IMPRESSION: No acute abnormality.  Stable changes of COPD. Electronically Signed   By: Claudie Revering M.D.   On: 07/03/2015 20:31   Results for TAYLON, LAFFITTE (MRN [id number]) as of 07/03/2015 23:13  Ref. Range 10/14/2014 11:26 12/19/2014 10:56 03/21/2015 17:19 07/03/2015 16:06  BUN Latest Ref Range: 6-20 mg/dL 17 11 11 13   Creatinine Latest Ref Range: 0.61-1.24 mg/dL 1.28 1.34 1.30 1.31 (H)   2310:  BUN/Cr per baseline. INR therapeutic.  Pt has tol PO well while in the ED without N/V.  No stooling while in the ED.  Abd benign, VSS. Feels better and wants to go home now. Long hx of chronic pain with multiple ED visits for same.  Pt endorses acute flair of his usual long standing chronic pain today, no change from his usual chronic pain and symptom pattern.  Pt encouraged to f/u with his PMD, and GI MD for good continuity of care and control of his chronic symptoms.  Pt verb understanding.     Francine Graven, DO 07/05/15 1555

## 2015-07-06 ENCOUNTER — Other Ambulatory Visit: Payer: Self-pay | Admitting: *Deleted

## 2015-08-09 ENCOUNTER — Other Ambulatory Visit: Payer: Self-pay | Admitting: Infectious Disease

## 2015-08-14 ENCOUNTER — Other Ambulatory Visit: Payer: Medicaid Other

## 2015-08-14 ENCOUNTER — Other Ambulatory Visit: Payer: Self-pay | Admitting: Infectious Disease

## 2015-08-14 DIAGNOSIS — B20 Human immunodeficiency virus [HIV] disease: Secondary | ICD-10-CM

## 2015-08-14 LAB — CBC WITH DIFFERENTIAL/PLATELET
Basophils Absolute: 44 {cells}/uL (ref 0–200)
Basophils Relative: 1 %
Eosinophils Absolute: 176 {cells}/uL (ref 15–500)
Eosinophils Relative: 4 %
HCT: 40.8 % (ref 38.5–50.0)
Hemoglobin: 13.8 g/dL (ref 13.2–17.1)
Lymphocytes Relative: 53 %
Lymphs Abs: 2332 {cells}/uL (ref 850–3900)
MCH: 36.1 pg — ABNORMAL HIGH (ref 27.0–33.0)
MCHC: 33.8 g/dL (ref 32.0–36.0)
MCV: 106.8 fL — ABNORMAL HIGH (ref 80.0–100.0)
MPV: 11.9 fL (ref 7.5–12.5)
Monocytes Absolute: 264 {cells}/uL (ref 200–950)
Monocytes Relative: 6 %
Neutro Abs: 1584 {cells}/uL (ref 1500–7800)
Neutrophils Relative %: 36 %
Platelets: 120 10*3/uL — ABNORMAL LOW (ref 140–400)
RBC: 3.82 MIL/uL — ABNORMAL LOW (ref 4.20–5.80)
RDW: 14.7 % (ref 11.0–15.0)
WBC: 4.4 10*3/uL (ref 3.8–10.8)

## 2015-08-14 LAB — COMPLETE METABOLIC PANEL WITH GFR
ALBUMIN: 3.8 g/dL (ref 3.6–5.1)
ALK PHOS: 72 U/L (ref 40–115)
ALT: 8 U/L — ABNORMAL LOW (ref 9–46)
AST: 15 U/L (ref 10–40)
BUN: 25 mg/dL (ref 7–25)
CALCIUM: 8.3 mg/dL — AB (ref 8.6–10.3)
CHLORIDE: 109 mmol/L (ref 98–110)
CO2: 24 mmol/L (ref 20–31)
Creat: 1.29 mg/dL (ref 0.60–1.35)
GFR, EST NON AFRICAN AMERICAN: 65 mL/min (ref >=60)
GFR, Est African American: 75 mL/min (ref >=60)
Glucose, Bld: 80 mg/dL (ref 65–99)
POTASSIUM: 4.2 mmol/L (ref 3.5–5.3)
SODIUM: 140 mmol/L (ref 135–146)
Total Bilirubin: 0.3 mg/dL (ref 0.2–1.2)
Total Protein: 6.3 g/dL (ref 6.1–8.1)

## 2015-08-15 ENCOUNTER — Other Ambulatory Visit: Payer: Medicaid Other

## 2015-08-15 LAB — HIV-1 RNA QUANT-NO REFLEX-BLD
HIV 1 RNA QUANT: 1124 {copies}/mL — AB (ref <20)
HIV-1 RNA Quant, Log: 3.05 Log copies/mL — ABNORMAL HIGH (ref <1.30)

## 2015-08-15 LAB — T-HELPER CELL (CD4) - (RCID CLINIC ONLY)
CD4 T CELL HELPER: 16 % — AB (ref 33–55)
CD4 T Cell Abs: 390 /uL — ABNORMAL LOW (ref 400–2700)

## 2015-08-16 ENCOUNTER — Telehealth: Payer: Self-pay | Admitting: Infectious Disease

## 2015-08-16 DIAGNOSIS — B20 Human immunodeficiency virus [HIV] disease: Secondary | ICD-10-CM

## 2015-08-16 NOTE — Telephone Encounter (Signed)
Called patient, left message asking him to call triage or pharmacy.   Landis Gandy, RN

## 2015-08-16 NOTE — Telephone Encounter (Signed)
Thanks Michelle

## 2015-08-16 NOTE — Telephone Encounter (Signed)
Patient VL up  Has he stopped taking meds  We need genotype added including INSTI testing

## 2015-08-22 NOTE — Telephone Encounter (Signed)
Patient called back and stated he has not stopped taking his Hiv meds. Advised him that Dr. Tommy Medal is waiting on his genotype results. Myrtis Hopping

## 2015-08-23 NOTE — Addendum Note (Signed)
Addended by: Lorne Skeens D on: 08/23/2015 10:37 AM   Modules accepted: Orders

## 2015-09-01 LAB — HIV-1 GENOTYPING (RTI,PI,IN INHBTR): HIV-1 Genotype: DETECTED — AB

## 2015-09-06 NOTE — Telephone Encounter (Signed)
Received letter dated June 23 that the flunisolide was denied. Per the letter, the patient needs to try/fail 2 preferred agents from the same class, notwithstanding clinical justification. Will send to [facility] for appeal assistance. Landis Gandy, RN

## 2015-09-07 ENCOUNTER — Other Ambulatory Visit: Payer: Self-pay | Admitting: *Deleted

## 2015-09-07 DIAGNOSIS — R0981 Nasal congestion: Secondary | ICD-10-CM

## 2015-09-07 MED ORDER — MOMETASONE FUROATE 50 MCG/ACT NA SUSP: 2.0000 | Freq: Every day | NASAL | Status: DC

## 2015-09-07 NOTE — Progress Notes (Signed)
Flunisolide not covered by insurance despite appeal.  Patient needs to initiate a hearing to press the issue. Nasonex prescription sent per [facility] as suitable alternative.  Rx sent to Dr. Tommy Medal for Fontanet. Landis Gandy, RN

## 2015-09-08 NOTE — Progress Notes (Signed)
Ok excellent

## 2015-12-08 ENCOUNTER — Other Ambulatory Visit: Payer: Self-pay | Admitting: Internal Medicine

## 2016-01-17 ENCOUNTER — Telehealth: Payer: Self-pay | Admitting: *Deleted

## 2016-01-17 NOTE — Telephone Encounter (Signed)
Medicaid PA completed and faxed for Nasonex Spray.  Non-preferred per Medicaid.

## 2016-02-07 ENCOUNTER — Other Ambulatory Visit: Payer: Self-pay | Admitting: Infectious Disease

## 2016-02-13 ENCOUNTER — Ambulatory Visit (INDEPENDENT_AMBULATORY_CARE_PROVIDER_SITE_OTHER): Payer: Medicaid Other | Admitting: Infectious Disease

## 2016-02-13 ENCOUNTER — Other Ambulatory Visit (HOSPITAL_COMMUNITY)
Admission: RE | Admit: 2016-02-13 | Discharge: 2016-02-13 | Disposition: A | Discharge status: Home / Self Care | Payer: Medicaid Other | Source: Ambulatory Visit | Attending: Infectious Disease | Admitting: Infectious Disease

## 2016-02-13 ENCOUNTER — Encounter: Payer: Self-pay | Admitting: Infectious Disease

## 2016-02-13 VITALS — BP 128/87 | HR 98 | Temp 97.8°F | Wt 126.0 lb

## 2016-02-13 DIAGNOSIS — M549 Dorsalgia, unspecified: Secondary | ICD-10-CM

## 2016-02-13 DIAGNOSIS — M5441 Lumbago with sciatica, right side: Secondary | ICD-10-CM

## 2016-02-13 DIAGNOSIS — B20 Human immunodeficiency virus [HIV] disease: Secondary | ICD-10-CM

## 2016-02-13 DIAGNOSIS — F339 Major depressive disorder, recurrent, unspecified: Secondary | ICD-10-CM

## 2016-02-13 DIAGNOSIS — G8929 Other chronic pain: Secondary | ICD-10-CM

## 2016-02-13 DIAGNOSIS — Z113 Encounter for screening for infections with a predominantly sexual mode of transmission: Secondary | ICD-10-CM | POA: Insufficient documentation

## 2016-02-13 DIAGNOSIS — G43A1 Cyclical vomiting, intractable: Secondary | ICD-10-CM

## 2016-02-13 DIAGNOSIS — Z23 Encounter for immunization: Secondary | ICD-10-CM

## 2016-02-13 DIAGNOSIS — Z952 Presence of prosthetic heart valve: Secondary | ICD-10-CM

## 2016-02-13 DIAGNOSIS — R1110 Vomiting, unspecified: Secondary | ICD-10-CM

## 2016-02-13 DIAGNOSIS — R1115 Cyclical vomiting syndrome unrelated to migraine: Secondary | ICD-10-CM

## 2016-02-13 HISTORY — DX: Dorsalgia, unspecified: M54.9

## 2016-02-13 LAB — COMPLETE METABOLIC PANEL WITH GFR
ALBUMIN: 3.9 g/dL (ref 3.6–5.1)
ALT: 9 U/L (ref 9–46)
AST: 16 U/L (ref 10–40)
Alkaline Phosphatase: 73 U/L (ref 40–115)
BUN: 11 mg/dL (ref 7–25)
CALCIUM: 8.6 mg/dL (ref 8.6–10.3)
CO2: 29 mmol/L (ref 20–31)
CREATININE: 1.5 mg/dL — AB (ref 0.60–1.35)
Chloride: 106 mmol/L (ref 98–110)
GFR, EST AFRICAN AMERICAN: 63 mL/min (ref >=60)
GFR, EST NON AFRICAN AMERICAN: 54 mL/min — AB (ref >=60)
Glucose, Bld: 81 mg/dL (ref 65–99)
POTASSIUM: 4.1 mmol/L (ref 3.5–5.3)
SODIUM: 140 mmol/L (ref 135–146)
TOTAL PROTEIN: 6.4 g/dL (ref 6.1–8.1)
Total Bilirubin: 0.5 mg/dL (ref 0.2–1.2)

## 2016-02-13 LAB — CBC WITH DIFFERENTIAL/PLATELET
BASOS PCT: 1 %
Basophils Absolute: 57 cells/uL (ref 0–200)
EOS ABS: 228 {cells}/uL (ref 15–500)
Eosinophils Relative: 4 %
HEMATOCRIT: 38.2 % — AB (ref 38.5–50.0)
HEMOGLOBIN: 12.8 g/dL — AB (ref 13.2–17.1)
LYMPHS PCT: 43 %
Lymphs Abs: 2451 cells/uL (ref 850–3900)
MCH: 37.2 pg — ABNORMAL HIGH (ref 27.0–33.0)
MCHC: 33.5 g/dL (ref 32.0–36.0)
MCV: 111 fL — AB (ref 80.0–100.0)
MONO ABS: 456 {cells}/uL (ref 200–950)
MPV: 10.7 fL (ref 7.5–12.5)
Monocytes Relative: 8 %
NEUTROS PCT: 44 %
Neutro Abs: 2508 cells/uL (ref 1500–7800)
Platelets: 200 10*3/uL (ref 140–400)
RBC: 3.44 MIL/uL — ABNORMAL LOW (ref 4.20–5.80)
RDW: 14.6 % (ref 11.0–15.0)
WBC: 5.7 10*3/uL (ref 3.8–10.8)

## 2016-02-13 NOTE — Progress Notes (Signed)
Chief complaint: Ill suffering from problems of vomiting not controllable except with suppositories of Phenergan  Subjective:    Patient ID: Brandon [name], male    DOB: [birth year], 50 y.o.   MRN: [id number]  HPI  Mr Kuyper is a highly complicated man with history of  HIV/AIDS and Multi-DRUG RESISTANT virus formerly followed at [facility]. His HIV nadir was  20 when we first met him   He had been on various complicated antiretroviral regimens in the past, with unfortunate GENOTYPIC resistance to all non-nucleoside reverse transcriptase inhibitors and all NRTIs, Resistance to all protease inhibitors with the exception of Prezista which had some activity genotypically,, Resistance to Isentress, and Elvitegravir and 100X reduced S to dolutegravir having both a 148H and 140S  and with Dual tropic virus.  02/12/2010 phenotype at [facility] showed:  RT: NRTI: ABC, DDI, D4T, AZT, TDF: resistant; 3TC, FTC: susceptible; NNRTI: EFV susceptible; RPV, NVP, ETR, DLV: Resistant; PI: pan-resistant  He had decided nto go back onto [facility] and WAS  referred to [facility].  We  Had  seen him and placed him on a  salvage regimen of Prezista 656m  Twice daily boosted with Norvir 1066mtwice daily, Tivicay twice daily, Combivir twice daily and once daily Viread.  And since then he haD BEEN WITH AN UNDETECTABLE VIRAL LOAD <20 FOR MORE THAN THREE  YEARS  And CD4 count.  Unfortunately his summers viral load popped up into the thousands though resistance testing failed to show any new evidence of resistance. I wanted to see him shortly thereafter but he has not been seen in clinic until today when he came in accompanied his wife to her visit. We made her that he had a dedicated visit for him today to be rechecking his labs.  He tells me that he did miss 3 doses of his meds due to the vomiting but I expect he mustn't miss more than that to of had his wire load go above thousand copies. I suggested that any premedicated with  Phenergan suppositories and hour prior to his antiretrovirals dosing. He feels that marijuana actually helps with the nausea and he doubts that he has these cyclical nausea and vomiting associated with marijuana condition that was proposed at one time. He states that the vomiting and nausea is no better off antiretrovirals in on them.   Lab Results  Component Value Date   HIV1RNAQUANT 1,124 (H) 08/14/2015   HIV1RNAQUANT <20 03/21/2015   HIV1RNAQUANT <20 12/19/2014    Lab Results  Component Value Date   CD4TABS 390 (L) 08/14/2015   CD4TABS 390 (L) 03/21/2015   CD4TABS 290 (L) 12/19/2014       Past Medical History:  Diagnosis Date  . Abdominal pain   . Anemia   . Arthritis   . Constipation   . Diarrhea   . Foot lesion 12/19/2014  . Gallstones   . Gastric AVM   . GERD (gastroesophageal reflux disease)   . GI bleed   . HIV (human immunodeficiency virus infection) ([facility]  . Hypertension   . IBS (irritable bowel syndrome)   . Infectious colitis   . Interstitial cystitis   . Mechanical heart valve present   . Nausea & vomiting   . Pancreatitis   . 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 Stroke ([facility]  . Weight loss, unintentional     Past Surgical History:  Procedure Laterality Date  . AORTIC VALVE REPLACEMENT    . CARDIAC SURGERY    .  CHOLECYSTECTOMY  02/12/2012   Procedure: LAPAROSCOPIC CHOLECYSTECTOMY;  Surgeon: Stark Klein, MD;  Location: [place];  Service: General;  Laterality: N/A;  . ESOPHAGOGASTRODUODENOSCOPY N/A 07/24/2012   Procedure: ESOPHAGOGASTRODUODENOSCOPY (EGD);  Surgeon: Beryle Beams, MD;  Location: [facility] ENDOSCOPY;  Service: Endoscopy;  Laterality: N/A;  . KNEE SURGERY      Family History  Problem Relation Age of Onset  . Hypertension Father   . Cancer - Prostate Father   . Cancer Father     stomach  . Hypertension Sister   . Diabetes Maternal Aunt   . Cancer - Other Cousin   . Parkinson's disease Paternal Aunt       Social  History   Social History  . Marital status: Married    Spouse name: N/A  . Number of children: 6  . Years of education: N/A   Occupational History  . disability rep    Social History Main Topics  . Smoking status: Current Every Day Smoker    Packs/day: 0.10    Years: 20.00    Types: Cigars, Cigarettes  . Smokeless tobacco: Never Used     Comment: cutting back  . Alcohol use 0.0 oz/week     Comment: 12oz beer/ per week   . Drug use:     Frequency: 7.0 times per week    Types: Marijuana     Comment: daily 2 joints   . Sexual activity: Yes    Partners: Female    Birth control/ protection: Condom   Other Topics Concern  . Not on file   Social History Narrative  . No narrative on file    Allergies  Allergen Reactions  . Bactrim [Sulfamethoxazole-Trimethoprim]   . Bee Venom Anaphylaxis  . Sulfa Antibiotics Anaphylaxis  . Truvada [Emtricitabine-Tenofovir Df] Anaphylaxis and Rash    Takes plain tenofovir at home  . Lidoderm [Lidocaine] Other (See Comments)    Reaction unknown  . Raltegravir     resistance  . Ceftriaxone Rash  . Sulfamethoxazole Itching, Other (See Comments) and Rash    Other reaction(s): Hypotension (ALLERGY/intolerance)     Current Outpatient Prescriptions:  .  dicyclomine (BENTYL) 20 MG tablet, Take 1 tablet (20 mg total) by mouth 4 (four) times daily -  before meals and at bedtime., Disp: 30 tablet, Rfl: 2 .  dicyclomine (BENTYL) 20 MG tablet, Take 1 tablet (20 mg total) by mouth every 6 (six) hours as needed for spasms (abdominal cramping)., Disp: 15 tablet, Rfl: 0 .  ENSURE (ENSURE), Take 237 mLs by mouth 2 (two) times daily between meals. Provide a case per month: BMI<20 (Patient not taking: Reported on 07/03/2015), Disp: 237 mL, Rfl: 6 .  EPINEPHrine (EPI-PEN) 0.3 mg/0.3 mL DEVI, Inject 0.3 mLs (0.3 mg total) into the muscle once., Disp: 1 Device, Rfl: 1 .  escitalopram (LEXAPRO) 20 MG tablet, TAKE 1 TABLET BY MOUTH   DAILY, Disp: 30 tablet,  Rfl: 5 .  lamiVUDine-zidovudine (COMBIVIR) 150-300 MG tablet, TAKE 1 TABLET BY MOUTH TWO TIMES DAILY., Disp: 60 tablet, Rfl: 4 .  lipase/protease/amylase (CREON) 36000 UNITS CPEP capsule, Take one po with each meal and snack., Disp: 180 capsule, Rfl: 3 .  mometasone (NASONEX) 50 MCG/ACT nasal spray, Place 2 sprays into the nose daily., Disp: 17 g, Rfl: 12 .  NORVIR 100 MG TABS tablet, TAKE 1 TABLET BY MOUTH TWICE A DAY, Disp: 60 tablet, Rfl: 4 .  ondansetron (ZOFRAN ODT) 8 MG disintegrating tablet, Take 1 tablet (8 mg total) by  mouth every 8 (eight) hours as needed for nausea or vomiting., Disp: 30 tablet, Rfl: 0 .  ondansetron (ZOFRAN) 4 MG tablet, Take 1 tablet (4 mg total) by mouth every 8 (eight) hours as needed for nausea or vomiting., Disp: 6 tablet, Rfl: 0 .  PREZISTA 600 MG tablet, TAKE 1 TABLET (600 MG TOTAL) BY MOUTH TWO TIMES DAILY WITH A MEAL., Disp: 60 tablet, Rfl: 4 .  prochlorperazine (COMPAZINE) 10 MG tablet, TAKE 1 TABLET BY MOUTH EVERY 6 HOURS AS NEEDED FOR NAUSEA, Disp: 120 tablet, Rfl: 0 .  ranitidine (ZANTAC) 300 MG tablet, TAKE 1 TABLET (300 MG TOTAL) BY MOUTH AT BEDTIME., Disp: 30 tablet, Rfl: 3 .  saccharomyces boulardii (FLORASTOR) 250 MG capsule, Take 1 capsule (250 mg total) by mouth 2 (two) times daily. For 30 days (Patient not taking: Reported on 07/03/2015), Disp: 60 capsule, Rfl: 1 .  TIVICAY 50 MG tablet, TAKE 1 TABLET (50 MG TOTAL) BY MOUTH TWO TIMES DAILY., Disp: 60 tablet, Rfl: 4 .  traZODone (DESYREL) 50 MG tablet, Take 1 tablet (50 mg total) by mouth at bedtime., Disp: 30 tablet, Rfl: 11 .  valACYclovir (VALTREX) 1000 MG tablet, TAKE 1 TABLET (1,000 MG TOTAL) BY MOUTH DAILY., Disp: 30 tablet, Rfl: 4 .  VIREAD 300 MG tablet, TAKE 1 TABLET (300 MG TOTAL) BY MOUTH DAILY., Disp: 30 tablet, Rfl: 4 .  warfarin (COUMADIN) 1 MG tablet, Take as directed with 73m tablet per [facility]. Take 5 mg once daily on Monday and Friday. Take 6 mg by mouth once daily on Tuesday,  Wednesday, Thursday, Saturday, and Sunday., Disp: , Rfl:  .  warfarin (COUMADIN) 5 MG tablet, as directed. With 1 mg tablet by anti-coagulation clinic. Take 5 mg once daily on Monday and Friday. Take 6 mg by mouth once daily on Tuesday, Wednesday, Thursday, Saturday, and Sunday., Disp: , Rfl:  .  zolpidem (AMBIEN) 10 MG tablet, Take 10 mg by mouth at bedtime. , Disp: , Rfl:  No current facility-administered medications for this visit.   Facility-Administered Medications Ordered in Other Visits:  .  0.9 %  sodium chloride infusion, , Intravenous, Once, JCampbell Riches MD   Review of Systems  Constitutional: Negative for activity change, diaphoresis and unexpected weight change.  HENT: Negative for congestion, rhinorrhea, sinus pressure, sneezing, sore throat and trouble swallowing.   Eyes: Negative for photophobia and visual disturbance.  Respiratory: Negative for chest tightness, shortness of breath, wheezing and stridor.   Cardiovascular: Negative for chest pain, palpitations and leg swelling.  Gastrointestinal: Positive for nausea and vomiting. Negative for abdominal distention and anal bleeding.  Genitourinary: Negative for flank pain.  Musculoskeletal: Positive for back pain and myalgias. Negative for gait problem and joint swelling.  Skin: Negative for rash and wound.  Neurological: Negative for tremors.  Hematological: Negative for adenopathy. Does not bruise/bleed easily.  Psychiatric/Behavioral: Positive for dysphoric mood. Negative for agitation, confusion, decreased concentration, hallucinations, self-injury and suicidal ideas. The patient is not hyperactive.        Objective:   Physical Exam  Constitutional: He is oriented to person, place, and time. No distress.  HENT:  Head: Normocephalic and atraumatic.  Mouth/Throat: Oropharynx is clear and moist. No oropharyngeal exudate.  Eyes: Conjunctivae and EOM are normal. No scleral icterus.  Neck: Normal range of motion. Neck  supple. No JVD present.  Cardiovascular: Regular rhythm and normal heart sounds.   Pulmonary/Chest: Effort normal. No respiratory distress. He has no wheezes.  Abdominal: Soft. Bowel  sounds are normal. He exhibits no distension. There is tenderness in the right upper quadrant. There is no rigidity, no rebound and no guarding.  Musculoskeletal: He exhibits no edema or tenderness.  Lymphadenopathy:    He has no cervical adenopathy.  Neurological: He is alert and oriented to person, place, and time. He displays no atrophy. He exhibits normal muscle tone. Coordination normal.  Skin: Skin is warm and dry. He is not diaphoretic. No erythema. No pallor.  Psychiatric: His behavior is normal. Judgment and thought content normal. He exhibits a depressed mood.          Assessment & Plan:    # 1HIV Disease, hx of AIDS,  Highly Resistant Virus.  HOPEFUL THAT THE VIRUS IS COMING UNDER CONTROL AGAIN AND THAT AT MINIMUM HE IS NOT DEVELOPING NEW RESISTANCE BECAUSE THERE ARE NOT MANY OPTIONS FOR HIM AT THIS POINT IN TIME HE ALSO DID NOT TOLERATE fUZEON VERY WELL.  aSSUMING THAT hIS VIRUS HAS COME UNDER CONTROL I WOULD LIKE TO CHANGE HIM TO THE FOLLOWING :  --Tivicay 73m BID --Prezista 6078mBID with  --Norvir 10074mID -AZT  BID --DESCOVY q daily  nOTE AT ONE OF HIS LAST VISITS HE DID CLAIM TO BE ALLERGIC TO tRUVADA AND THERE WAS SOME CONCERN ABOUT HIM BEING ALLERGIC TO THE EMTRICITABINE COMPONENT. i'M HIGHLY DOUBTFUL THAT HE REALLY HAS ALLERGIES TO EMTRICITABINE AND I THINK IT'S WORTHWHILE CHALLENGING HIM WITH descovy.i DON'T KNOW IF WE'LL BE VEMLIDY INSTEAD OF vIREAD  WOULD LIKE HIM TO c cASSIE IN 2 WEEKS' TIMEAND MAKE THE CHANGE THAT I PROPOSED ABOVE THEN  #2 Chronic diarrhea cyclic vomiting: Greatly appreciate GI help here. I WOULD LIKE [facility] TO TAKE HIS SUPPOSITORIES  AN HOUR BEFORE EACH OF HIS DOSES OF HISROVIRALS OR DO  #3 hx of AV replacement on anticoaguation: followed by PCP  #4  Major Depression, chronic and Recurrent Depression: Continue SSRI and counseling here.   #5 LOW BACK PAIN AR OSTEOPOROSIS WILL CHECKIN FILMS OF LUMBAR SPINE WITH A VITAMIN d LEVEL  I spent greater than 40 minutes with the patient including greater than 50% of time in face to face counsel of the patient re his HIV,HIS PROBLEMS THAT CONTINUE WITH NAUSEA AND VOMITING HIS RECENT VIREMIA, NEED TO BE HIGHLY ADHERENT TO HIS ANTIVIRAL MEDICATIONS HISDEPRESSION HI  depression grieiviing and in coordination of his care.

## 2016-02-14 ENCOUNTER — Other Ambulatory Visit: Payer: Self-pay | Admitting: Infectious Disease

## 2016-02-14 DIAGNOSIS — F329 Major depressive disorder, single episode, unspecified: Secondary | ICD-10-CM

## 2016-02-14 DIAGNOSIS — F32A Depression, unspecified: Secondary | ICD-10-CM

## 2016-02-14 LAB — RPR

## 2016-02-15 LAB — T-HELPER CELL (CD4) - (RCID CLINIC ONLY)
CD4 % Helper T Cell: 17 % — ABNORMAL LOW (ref 33–55)
CD4 T Cell Abs: 430 /uL (ref 400–2700)

## 2016-02-15 LAB — URINE CYTOLOGY ANCILLARY ONLY
Chlamydia: NEGATIVE
Neisseria Gonorrhea: NEGATIVE

## 2016-02-17 LAB — HIV RNA, RTPCR W/R GT (RTI, PI,INT)
HIV-1 RNA, QN PCR: 2.79 Log copies/mL — ABNORMAL HIGH
HIV-1 RNA, QN PCR: 619 {copies}/mL — AB

## 2016-02-24 LAB — RFLX HIV-1 INTEGRASE GENOTYPE

## 2016-02-24 LAB — HIV-1 GENOTYPE: HIV-1 Genotype: DETECTED — AB

## 2016-02-29 ENCOUNTER — Ambulatory Visit: Payer: Medicaid Other | Admitting: Pharmacist

## 2016-02-29 DIAGNOSIS — B20 Human immunodeficiency virus [HIV] disease: Secondary | ICD-10-CM

## 2016-02-29 DIAGNOSIS — R112 Nausea with vomiting, unspecified: Secondary | ICD-10-CM

## 2016-02-29 MED ORDER — DICYCLOMINE HCL 20 MG PO TABS: 20.0000 mg | ORAL_TABLET | Freq: Four times a day (QID) | ORAL | 0 refills | Status: DC | PRN

## 2016-02-29 MED ORDER — CROFELEMER 125 MG PO TBEC: 1.0000 | DELAYED_RELEASE_TABLET | Freq: Two times a day (BID) | ORAL | 11 refills | Status: DC

## 2016-02-29 MED ORDER — DOLUTEGRAVIR SODIUM 50 MG PO TABS: ORAL_TABLET | ORAL | 4 refills | Status: DC

## 2016-02-29 MED ORDER — EMTRICITABINE-TENOFOVIR AF 200-25 MG PO TABS: 1.0000 | ORAL_TABLET | Freq: Every day | ORAL | 4 refills | Status: DC

## 2016-02-29 MED ORDER — ZIDOVUDINE 300 MG PO TABS: 300.0000 mg | ORAL_TABLET | Freq: Two times a day (BID) | ORAL | 4 refills | Status: DC

## 2016-02-29 MED ORDER — RITONAVIR 100 MG PO TABS: 100.0000 mg | ORAL_TABLET | Freq: Two times a day (BID) | ORAL | 4 refills | Status: DC

## 2016-02-29 MED ORDER — DARUNAVIR ETHANOLATE 600 MG PO TABS: ORAL_TABLET | ORAL | 4 refills | Status: DC

## 2016-02-29 MED FILL — DESCOVY 200-25 MG TABS: 200-25 | 30 days supply | Qty: 30 | Fill #0

## 2016-02-29 MED FILL — ZIDOVUDINE 300 MG TABLET: 300 | 30 days supply | Qty: 60 | Fill #0

## 2016-02-29 MED FILL — MYTESI 125 MG TBEC: 125 | 30 days supply | Qty: 60 | Fill #0

## 2016-02-29 NOTE — Progress Notes (Signed)
HPI: Brandon [name] is a 50 y.o. male who presents to the [facility] clinic today for follow-up of his highly resistant HIV infection.    Allergies: Allergies  Allergen Reactions  . Bactrim [Sulfamethoxazole-Trimethoprim]   . Bee Venom Anaphylaxis  . Sulfa Antibiotics Anaphylaxis  . Truvada [Emtricitabine-Tenofovir Df] Anaphylaxis and Rash    Takes plain tenofovir at home  . Lidoderm [Lidocaine] Other (See Comments)    Reaction unknown  . Raltegravir     resistance  . Ceftriaxone Rash  . Sulfamethoxazole Itching, Other (See Comments) and Rash    Other reaction(s): Hypotension (ALLERGY/intolerance)    Past Medical History: Past Medical History:  Diagnosis Date  . Abdominal pain   . Anemia   . Arthritis   . Back pain 02/13/2016  . Constipation   . Diarrhea   . Foot lesion 12/19/2014  . Gallstones   . Gastric AVM   . GERD (gastroesophageal reflux disease)   . GI bleed   . HIV (human immunodeficiency virus infection) ([place])   . Hypertension   . IBS (irritable bowel syndrome)   . Infectious colitis   . Interstitial cystitis   . Mechanical heart valve present   . Nausea & vomiting   . Pancreatitis   . 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 Stroke ([place])   . Weight loss, unintentional     Social History: Social History   Social History  . Marital status: Married    Spouse name: N/A  . Number of children: 6  . Years of education: N/A   Occupational History  . disability rep    Social History Main Topics  . Smoking status: Current Every Day Smoker    Packs/day: 0.10    Years: 20.00    Types: Cigars, Cigarettes  . Smokeless tobacco: Never Used     Comment: cutting back  . Alcohol use 0.0 oz/week     Comment: 12oz beer/ per week   . Drug use:     Frequency: 7.0 times per week    Types: Marijuana     Comment: daily 2 joints   . Sexual activity: Yes    Partners: Female    Birth control/ protection: Condom   Other Topics Concern  . Not  on file   Social History Narrative  . No narrative on file    Current Regimen: Tivicay BID Prezista  BID Norvir BID Combivir BID Viread BID  Labs: HIV 1 RNA Quant (copies/mL)  Date Value  08/14/2015 1,124 (H)  03/21/2015 <20  12/19/2014 <20   CD4 T Cell Abs (/uL)  Date Value  02/13/2016 430  08/14/2015 390 (L)  03/21/2015 390 (L)   Hep B S Ab (no units)  Date Value  05/14/2012 NONREACTIVE   Hepatitis B Surface Ag (no units)  Date Value  05/14/2012 NEGATIVE   HCV Ab (no units)  Date Value  05/14/2012 NEGATIVE    CrCl: CrCl cannot be calculated (Unknown ideal weight.).  Lipids:    Component Value Date/Time   CHOL 99 07/18/2014 1601   TRIG 87 07/18/2014 1601   HDL 27 (L) 07/18/2014 1601   CHOLHDL 3.7 07/18/2014 1601   VLDL 17 07/18/2014 1601   LDLCALC 55 07/18/2014 1601    Assessment: Brandon [name] is here today for follow-up if his resistant HIV infection.  He has an immense amount of resistance - all NRTIs, all NNRTIs, all PIs with the exception of Prezista, and all integrase inhibitors with the exception of Tivicay but with  decreased sensitivity to that as well. He also has dual tropic virus. We started seeing him in 2014. He was placed on Tivicay BID, Prezista BID, Norvir BID, Combivir BID, and Viread daily at that time.  He was undetectable for quite some time but his viral load has recently spiked up. He also battles with nausea on a daily basis and tells me he has ~7 episodes of diarrhea daily.  After discussion of what he takes for this, I decided to try the new medication for HIV associated diarrhea - Mytesi.   I will change around his medications a little bit today per Dr. Tommy Medal.  I will stop his Combivir and Viread and replace it with AZT BID and Descovy. I explained this to the patient and he is willing to switch.  He currently gets his medications bubbled packed from [facility] [employer].  He said he just got a new shipment from them on Tuesday.  At first, I told  him to go ahead and take that bubble pack and we will start new medications next month, but he really wanted to change ASAP. He said he could go through the medications and pick out the ones not to take anymore.  I was very hesitant to let him do this as that is a very risky thing to do. We decided to switch all his medications to [facility].  He will bring me the bubble pack tomorrow morning on his way to work, I will go pick up his 2 new HIV medications plus the new diarrhea medication, and fill 4 pill boxes for him for a month's worth.  He will come back and pick up the pill boxes tomorrow afternoon. He has Medicaid, and the [facility] was covered. I will then proceed to fill his pill boxes for him on a monthly basis to see if we can get better control of his virus and keep a closer eye on him.  He seemed really comfortable with this plan.  He did not get labs today, so he will get them when he comes back to see Dr. Tommy Medal in January after starting new regimen.  Plans: - Continue Tivicay 50 mg PO BID - Continue Prezista 600 mg PO BID - Continue Norvir 100 mg PO BID - Stop Combivir and Viread - Start Zidovudine 300 mg PO BID - Start Descovy 200-25 mg PO daily - Bring me your bubble pack tomorrow morning - I will pick up your medications from [facility] and fill pill boxes for you - F/u with Dr. Tommy Medal 03/26/16 at 3:15pm  Cassie L. Westley Gambles, PharmD Infectious [facility] 02/29/2016, 4:38 PM

## 2016-03-01 ENCOUNTER — Ambulatory Visit: Payer: Medicaid Other | Admitting: Pharmacist

## 2016-03-01 ENCOUNTER — Telehealth: Payer: Self-pay | Admitting: Pharmacist

## 2016-03-01 NOTE — Telephone Encounter (Signed)
Called [facility] and told them to stop filling Kenyen's medications.  He will be filling them at [facility] from now on.

## 2016-03-01 NOTE — Telephone Encounter (Signed)
Perfect thanks Cassie!

## 2016-03-01 NOTE — Progress Notes (Deleted)
Filled [place]'s pill boxes for him and he picked them up.  Instructed him to bring them with him to Dr. Derek Mound appointment next month on 1/16 and we will refill them then.

## 2016-03-15 ENCOUNTER — Encounter: Payer: Self-pay | Admitting: Pharmacist Clinician (PhC)/ Clinical Pharmacy Specialist

## 2016-03-18 ENCOUNTER — Telehealth: Payer: Self-pay | Admitting: Pharmacist Clinician (PhC)/ Clinical Pharmacy Specialist

## 2016-03-18 ENCOUNTER — Other Ambulatory Visit: Payer: Self-pay | Admitting: Pharmacist Clinician (PhC)/ Clinical Pharmacy Specialist

## 2016-03-18 ENCOUNTER — Other Ambulatory Visit: Payer: Self-pay | Admitting: *Deleted

## 2016-03-18 MED ORDER — OPIUM 10 MG/ML (1%) PO TINC: 6.0000 mg | Freq: Four times a day (QID) | ORAL | 0 refills | Status: DC

## 2016-03-18 MED FILL — OPIUM TINCTURE 10 MG/ML: 10 MG/ML | 30 days supply | Qty: 72 | Fill #0

## 2016-03-18 NOTE — Telephone Encounter (Signed)
Brandon [name] has had this on going diarrhea issue. We tried to put him on Mytesi but he said it made it worse. He stated that lomotil didn't work for him in the past. It's also on backorder right now. Therefore, options are very limited at this point. D/w Dr. Tommy Medal, we are going to try tincture of opium with him. Since it's schedule 2, he'll have to sign a pain contract. He is going to stop by to pick up the prescription today and get filled at [facility] along with all of his other meds.

## 2016-03-25 MED FILL — PREZISTA 600 MG TABS: 600 | 30 days supply | Qty: 60 | Fill #0 | Status: TO

## 2016-03-25 MED FILL — TIVICAY 50 MG TABLET: 50 | 30 days supply | Qty: 60 | Fill #0 | Status: TO

## 2016-03-25 MED FILL — ZIDOVUDINE 300 MG TABLET: 300 | 30 days supply | Qty: 60 | Fill #1 | Status: TO

## 2016-03-25 MED FILL — DESCOVY 200-25 MG TABS: 200-25 | 30 days supply | Qty: 30 | Fill #1 | Status: TO

## 2016-03-25 MED FILL — NORVIR 100 MG TABLET: 100 | 30 days supply | Qty: 60 | Fill #0 | Status: TO

## 2016-03-25 MED FILL — DICYCLOMINE 20 MG TABLET: 20 | 4 days supply | Qty: 15 | Fill #0

## 2016-03-25 MED FILL — MYTESI 125 MG TBEC: 125 | 30 days supply | Qty: 60 | Fill #1

## 2016-03-26 ENCOUNTER — Ambulatory Visit: Payer: Medicaid Other | Admitting: Pharmacist

## 2016-03-26 ENCOUNTER — Ambulatory Visit (INDEPENDENT_AMBULATORY_CARE_PROVIDER_SITE_OTHER): Payer: Medicaid Other | Admitting: Infectious Disease

## 2016-03-26 DIAGNOSIS — R1110 Vomiting, unspecified: Secondary | ICD-10-CM | POA: Diagnosis not present

## 2016-03-26 DIAGNOSIS — R1115 Cyclical vomiting syndrome unrelated to migraine: Secondary | ICD-10-CM

## 2016-03-26 DIAGNOSIS — Z23 Encounter for immunization: Secondary | ICD-10-CM | POA: Diagnosis not present

## 2016-03-26 DIAGNOSIS — B20 Human immunodeficiency virus [HIV] disease: Secondary | ICD-10-CM | POA: Diagnosis not present

## 2016-03-26 DIAGNOSIS — A09 Infectious gastroenteritis and colitis, unspecified: Secondary | ICD-10-CM | POA: Diagnosis not present

## 2016-03-26 DIAGNOSIS — F339 Major depressive disorder, recurrent, unspecified: Secondary | ICD-10-CM | POA: Diagnosis not present

## 2016-03-26 DIAGNOSIS — R197 Diarrhea, unspecified: Secondary | ICD-10-CM

## 2016-03-26 DIAGNOSIS — Z952 Presence of prosthetic heart valve: Secondary | ICD-10-CM

## 2016-03-26 NOTE — Progress Notes (Signed)
Chief complaint: Suffering from loose stools though these have improved with tincture of opium. He has to take 0.4 mL because 0.6 apparently makes him nauseous and hasn't vomit, with a lower dose he has been able to have greater control of his diarrhea having only 3 bowel movements per day now.    Subjective:    Patient ID: Brandon [name], male    DOB: 1965-11-18, 51 y.o.   MRN: [id number]  HPI  Brandon [name] is a highly complicated man with history of  HIV/AIDS and Multi-DRUG RESISTANT virus formerly followed at [facility]. His HIV nadir was  20 when we first met him   He had been on various complicated antiretroviral regimens in the past, with unfortunate GENOTYPIC resistance to all non-nucleoside reverse transcriptase inhibitors and all NRTIs, Resistance to all protease inhibitors with the exception of Prezista which had some activity genotypically,, Resistance to Isentress, and Elvitegravir and 100X reduced S to dolutegravir having both a 148H and 140S  and with Dual tropic virus.  02/12/2010 phenotype at [facility] showed:  RT: NRTI: ABC, DDI, D4T, AZT, TDF: resistant; 3TC, FTC: susceptible; NNRTI: EFV susceptible; RPV, NVP, ETR, DLV: Resistant; PI: pan-resistant  He had decided nto go back onto [facility] and WAS  referred to [facility].  We  Had  seen him and placed him on a  salvage regimen of Prezista 643m  Twice daily boosted with Norvir 1043mtwice daily, Tivicay twice daily, Combivir twice daily and once daily Viread.  And since then he haD BEEN WITH AN UNDETECTABLE VIRAL LOAD <20 FOR MORE THAN THREE  YEARS  And  Healthy CD4 count.  Unfortunately his summers viral load popped up into the thousands though resistance testing failed to show any new evidence of resistance. I wanted to see him shortly thereafter but he has not been seen in clinic until today when he came in accompanied his wife to her visit.   We have since checked him again in his viral load has come down to 691  copies.  Resistance testing on this virus also failed to show any new resistance mutations.     Lab Results  Component Value Date   HIV1RNAQUANT 1,124 (H) 08/14/2015   HIV1RNAQUANT <20 03/21/2015   HIV1RNAQUANT <20 12/19/2014    Lab Results  Component Value Date   CD4TABS 430 02/13/2016   CD4TABS 390 (L) 08/14/2015   CD4TABS 390 (L) 03/21/2015    He claims it been adherent to his new regimen since right before the New Year's started and afterwards. He was really supposed to start a new regimen at least 2 weeks prior to that we met with Cassie filled off his medications for him.  Hopefully his brothers virus under control now where on certainly watch him every 2 weeks with frequent pharmacy visits.     Past Medical History:  Diagnosis Date  . Abdominal pain   . Anemia   . Arthritis   . Back pain 02/13/2016  . Constipation   . Diarrhea   . Foot lesion 12/19/2014  . Gallstones   . Gastric AVM   . GERD (gastroesophageal reflux disease)   . GI bleed   [place]  . Hypertension   . IBS (irritable bowel syndrome)   . Infectious colitis   . Interstitial cystitis   . Mechanical heart valve present   . Nausea & vomiting   . Pancreatitis   . 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  Stroke ([place])   . Weight loss, unintentional     Past Surgical History:  Procedure Laterality Date  . AORTIC VALVE REPLACEMENT    . CARDIAC SURGERY    . CHOLECYSTECTOMY  02/12/2012   Procedure: LAPAROSCOPIC CHOLECYSTECTOMY;  Surgeon: Stark Klein, MD;  Location: [place];  Service: General;  Laterality: N/A;  . ESOPHAGOGASTRODUODENOSCOPY N/A 07/24/2012   Procedure: ESOPHAGOGASTRODUODENOSCOPY (EGD);  Surgeon: Beryle Beams, MD;  Location: [facility] ENDOSCOPY;  Service: Endoscopy;  Laterality: N/A;  . KNEE SURGERY      Family History  Problem Relation Age of Onset  . Hypertension Father   . Cancer - Prostate Father   . Cancer Father      stomach  . Hypertension Sister   . Diabetes Maternal Aunt   . Cancer - Other Cousin   . Parkinson's disease Paternal Aunt       Social History   Social History  . Marital status: Married    Spouse name: N/A  . Number of children: 6  . Years of education: N/A   Occupational History  . disability rep    Social History Main Topics  . Smoking status: Current Every Day Smoker    Packs/day: 0.10    Years: 20.00    Types: Cigars, Cigarettes  . Smokeless tobacco: Never Used     Comment: cutting back  . Alcohol use 0.0 oz/week     Comment: 12oz beer/ per week   . Drug use:     Frequency: 7.0 times per week    Types: Marijuana     Comment: daily 2 joints   . Sexual activity: Yes    Partners: Female    Birth control/ protection: Condom   Other Topics Concern  . Not on file   Social History Narrative  . No narrative on file    Allergies  Allergen Reactions  . Bactrim [Sulfamethoxazole-Trimethoprim]   . Bee Venom Anaphylaxis  . Sulfa Antibiotics Anaphylaxis  . Truvada [Emtricitabine-Tenofovir Df] Anaphylaxis and Rash    Takes plain tenofovir at home  . Lidoderm [Lidocaine] Other (See Comments)    Reaction unknown  . Raltegravir     resistance  . Ceftriaxone Rash  . Sulfamethoxazole Itching, Other (See Comments) and Rash    Other reaction(s): Hypotension (ALLERGY/intolerance)     Current Outpatient Prescriptions:  .  darunavir (PREZISTA) 600 MG tablet, TAKE 1 TABLET (600 MG TOTAL) BY MOUTH TWO TIMES DAILY WITH A MEAL., Disp: 60 tablet, Rfl: 4 .  dicyclomine (BENTYL) 20 MG tablet, Take 1 tablet (20 mg total) by mouth every 6 (six) hours as needed for spasms (abdominal cramping)., Disp: 15 tablet, Rfl: 0 .  dolutegravir (TIVICAY) 50 MG tablet, TAKE 1 TABLET (50 MG TOTAL) BY MOUTH TWO TIMES DAILY., Disp: 60 tablet, Rfl: 4 .  emtricitabine-tenofovir AF (DESCOVY) 200-25 MG tablet, Take 1 tablet by mouth daily., Disp: 30 tablet, Rfl: 4 .  ENSURE (ENSURE), Take 237 mLs by  mouth 2 (two) times daily between meals. Provide a case per month: BMI<20, Disp: 237 mL, Rfl: 6 .  EPINEPHrine (EPI-PEN) 0.3 mg/0.3 mL DEVI, Inject 0.3 mLs (0.3 mg total) into the muscle once., Disp: 1 Device, Rfl: 1 .  escitalopram (LEXAPRO) 20 MG tablet, TAKE 1 TABLET BY MOUTH   DAILY, Disp: 30 tablet, Rfl: 1 .  mometasone (NASONEX) 50 MCG/ACT nasal spray, Place 2 sprays into the nose daily., Disp: 17 g, Rfl: 12 .  ondansetron (ZOFRAN ODT) 8 MG disintegrating tablet, Take 1 tablet (  8 mg total) by mouth every 8 (eight) hours as needed for nausea or vomiting., Disp: 30 tablet, Rfl: 0 .  ondansetron (ZOFRAN) 4 MG tablet, Take 1 tablet (4 mg total) by mouth every 8 (eight) hours as needed for nausea or vomiting., Disp: 6 tablet, Rfl: 0 .  Opium 10 MG/ML (1%) TINC, Take 0.6 mLs (6 mg total) by mouth every 6 (six) hours., Disp: 72 mL, Rfl: 0 .  prochlorperazine (COMPAZINE) 10 MG tablet, TAKE 1 TABLET BY MOUTH EVERY 6 HOURS AS NEEDED FOR NAUSEA, Disp: 120 tablet, Rfl: 0 .  ranitidine (ZANTAC) 300 MG tablet, TAKE 1 TABLET (300 MG TOTAL) BY MOUTH AT BEDTIME., Disp: 30 tablet, Rfl: 3 .  ritonavir (NORVIR) 100 MG TABS tablet, Take 1 tablet (100 mg total) by mouth 2 (two) times daily., Disp: 60 tablet, Rfl: 4 .  traZODone (DESYREL) 50 MG tablet, Take 1 tablet (50 mg total) by mouth at bedtime., Disp: 30 tablet, Rfl: 11 .  valACYclovir (VALTREX) 1000 MG tablet, TAKE 1 TABLET (1,000 MG TOTAL) BY MOUTH DAILY., Disp: 30 tablet, Rfl: 4 .  warfarin (COUMADIN) 1 MG tablet, Take as directed with 61m tablet per [facility]. Take 5 mg once daily on Monday and Friday. Take 6 mg by mouth once daily on Tuesday, Wednesday, Thursday, Saturday, and Sunday., Disp: , Rfl:  .  warfarin (COUMADIN) 5 MG tablet, as directed. With 1 mg tablet by anti-coagulation clinic. Take 5 mg once daily on Monday and Friday. Take 6 mg by mouth once daily on Tuesday, Wednesday, Thursday, Saturday, and Sunday., Disp: , Rfl:  .  zidovudine  (RETROVIR) 300 MG tablet, Take 1 tablet (300 mg total) by mouth 2 (two) times daily., Disp: 60 tablet, Rfl: 4 .  zolpidem (AMBIEN) 10 MG tablet, Take 10 mg by mouth at bedtime. , Disp: , Rfl:  No current facility-administered medications for this visit.   Facility-Administered Medications Ordered in Other Visits:  .  0.9 %  sodium chloride infusion, , Intravenous, Once, JCampbell Riches MD   Review of Systems  Constitutional: Negative for activity change, diaphoresis and unexpected weight change.  HENT: Negative for congestion, rhinorrhea, sinus pressure, sneezing, sore throat and trouble swallowing.   Eyes: Negative for photophobia and visual disturbance.  Respiratory: Negative for chest tightness, shortness of breath, wheezing and stridor.   Cardiovascular: Negative for chest pain, palpitations and leg swelling.  Gastrointestinal: Positive for diarrhea, nausea and vomiting. Negative for abdominal distention and anal bleeding.  Genitourinary: Negative for flank pain.  Musculoskeletal: Negative for gait problem and joint swelling.  Skin: Negative for rash and wound.  Neurological: Negative for tremors.  Hematological: Negative for adenopathy. Does not bruise/bleed easily.  Psychiatric/Behavioral: Negative for agitation, confusion, decreased concentration, hallucinations, self-injury and suicidal ideas. The patient is not hyperactive.        Objective:   Physical Exam  Constitutional: He is oriented to person, place, and time. No distress.  HENT:  Head: Normocephalic and atraumatic.  Mouth/Throat: Oropharynx is clear and moist. No oropharyngeal exudate.  Eyes: Conjunctivae and EOM are normal. No scleral icterus.  Neck: Normal range of motion. Neck supple. No JVD present.  Cardiovascular: Regular rhythm and normal heart sounds.   Pulmonary/Chest: Effort normal. No respiratory distress. He has no wheezes.  Abdominal: Soft. Bowel sounds are normal. He exhibits no distension. There is  tenderness in the right upper quadrant. There is no rigidity, no rebound and no guarding.  Musculoskeletal: He exhibits no edema or tenderness.  Lymphadenopathy:    He has no cervical adenopathy.  Neurological: He is alert and oriented to person, place, and time. He displays no atrophy. He exhibits normal muscle tone. Coordination normal.  Skin: Skin is warm and dry. He is not diaphoretic. No erythema. No pallor.  Psychiatric: His behavior is normal. Judgment and thought content normal. He exhibits a depressed mood.          Assessment & Plan:    # 1HIV Disease, hx of AIDS,  Highly Resistant Virus.   HOPEFUL THAT THE VIRUS IS COMING UNDER CONTROL AGAIN AND THAT AT MINIMUM HE IS NOT DEVELOPING NEW RESISTANCE BECAUSE THERE ARE NOT MANY OPTIONS FOR HIM AT THIS POINT IN TIME HE ALSO DID NOT TOLERATE fUZEON VERY WELL.  New regimen is  --Tivicay 37m BID --Prezista 6073mBID with  --Norvir 10086mID -AZT  BID --DESCOVY q daily  Make sure that he sees pharmacy in the next 2 weeks or so and then sees me in 2 months hopefully can following his virus back under control. I do not want to have to resort to subcutaneous Fuzeon he is opposed to that as well I also really don't want to use intravenous Fuzeon  #2 Chronic diarrhea cyclic vomiting: He is now on tincture of opium and back on a pain contract. #3 hx of AV replacement on anticoaguation: followed by PCP  #4 Major Depression, chronic and Recurrent Depression: Continue SSRI and counseling here.   I spent greater than 25 minutes with the patient including greater than 50% of time in face to face counsel of the patient re his HIV,HIS PROBLEMS THAT CONTINUE WITH NAUSEA AND VOMITING HIS RECENT VIREMIA, NEED TO BE HIGHLY ADHERENT TO HIS ANTIVIRAL MEDICATIONS HISDEPRESSION HI  depression  and in coordination of his care.

## 2016-03-26 NOTE — Progress Notes (Signed)
HPI: Brandon [name] is a 51 y.o. male who presents to the [place] clinic for HIV follow-up and to refill his pill box.   Allergies: Allergies  Allergen Reactions  . Bactrim [Sulfamethoxazole-Trimethoprim]   . Bee Venom Anaphylaxis  . Sulfa Antibiotics Anaphylaxis  . Truvada [Emtricitabine-Tenofovir Df] Anaphylaxis and Rash    Takes plain tenofovir at home  . Lidoderm [Lidocaine] Other (See Comments)    Reaction unknown  . Raltegravir     resistance  . Ceftriaxone Rash  . Sulfamethoxazole Itching, Other (See Comments) and Rash    Other reaction(s): Hypotension (ALLERGY/intolerance)    Past Medical History: Past Medical History:  Diagnosis Date  . Abdominal pain   . Anemia   . Arthritis   . Back pain 02/13/2016  . Constipation   . Diarrhea   . Foot lesion 12/19/2014  . Gallstones   . Gastric AVM   . GERD (gastroesophageal reflux disease)   . GI bleed   . HIV (human immunodeficiency virus infection) ([place])   . Hypertension   . IBS (irritable bowel syndrome)   . Infectious colitis   . Interstitial cystitis   . Mechanical heart valve present   . Nausea & vomiting   . Pancreatitis   . 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 Stroke ([place])   . Weight loss, unintentional     Social History: Social History   Social History  . Marital status: Married    Spouse name: N/A  . Number of children: 6  . Years of education: N/A   Occupational History  . disability rep    Social History Main Topics  . Smoking status: Current Every Day Smoker    Packs/day: 0.10    Years: 20.00    Types: Cigars, Cigarettes  . Smokeless tobacco: Never Used     Comment: cutting back  . Alcohol use 0.0 oz/week     Comment: 12oz beer/ per week   . Drug use:     Frequency: 7.0 times per week    Types: Marijuana     Comment: daily 2 joints   . Sexual activity: Yes    Partners: Female    Birth control/ protection: Condom   Other Topics Concern  . Not on file    Social History Narrative  . No narrative on file    Current Regimen: Tiivcay BID + Prezista BID + Norvir BID + Retrovir BID + Descovy  Labs: HIV 1 RNA Quant (copies/mL)  Date Value  08/14/2015 1,124 (H)  03/21/2015 <20  12/19/2014 <20   CD4 T Cell Abs (/uL)  Date Value  02/13/2016 430  08/14/2015 390 (L)  03/21/2015 390 (L)   Hep B S Ab (no units)  Date Value  05/14/2012 NONREACTIVE   Hepatitis B Surface Ag (no units)  Date Value  05/14/2012 NEGATIVE   HCV Ab (no units)  Date Value  05/14/2012 NEGATIVE    CrCl: CrCl cannot be calculated (Patient's most recent lab result is older than the maximum 21 days allowed.).  Lipids:    Component Value Date/Time   CHOL 99 07/18/2014 1601   TRIG 87 07/18/2014 1601   HDL 27 (L) 07/18/2014 1601   CHOLHDL 3.7 07/18/2014 1601   VLDL 17 07/18/2014 1601   LDLCALC 55 07/18/2014 1601    Assessment: Brandon [name] presents to the clinic today to see Dr. Tommy Medal and to see me to fill his pill box.  He came in to the office without his pill box.  I asked him why he didn't bring it and he said he had ~2 weeks of pills left in it.  I filled his pill box on 12/22 with all his medications he had from [facility], but he tells me he didn't start the pill box until 12/31. This makes me question whether he is taking his medications or not.  I made another appointment for him to come back in ~2 weeks to refill it. He is getting labs today, so I'm anxious to see what his viral load is.  He states the opium tincture is helping, but he still has some diarrhea but not nearly as much.  He states that the 0.6 mg dose is way too strong and causes him cramping, so he takes 0.4 mg usually 3 times per day. 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 I'll keep meeting with him monthly to try and get him back on track and to fill his pill box.   Plans: - Come back 04/08/16 at 4pm to refill pill box.  - Continue taking  your medications as prescribed  Cassie L. Westley Gambles, PharmD Infectious Diseases [place] [facility] 03/26/2016, 4:20 PM

## 2016-03-27 LAB — T-HELPER CELL (CD4) - (RCID CLINIC ONLY)
CD4 T CELL ABS: 520 /uL (ref 400–2700)
CD4 T CELL HELPER: 18 % — AB (ref 33–55)

## 2016-04-02 LAB — HIV RNA, RTPCR W/R GT (RTI, PI,INT): HIV-1 RNA, QN PCR: <1.3 Log copies/mL

## 2016-04-05 ENCOUNTER — Telehealth: Payer: Self-pay | Admitting: Pharmacist Clinician (PhC)/ Clinical Pharmacy Specialist

## 2016-04-05 NOTE — Telephone Encounter (Signed)
Your the best my friend!

## 2016-04-05 NOTE — Telephone Encounter (Signed)
Brandon [name] stopped by the office today to drop off his pill boxes to be refilled. He is doing great with his VL and it's undetectable now. He is going to come back this PM around 4 to pick them up. I'll stay around for this.

## 2016-04-08 ENCOUNTER — Ambulatory Visit: Payer: Medicaid Other

## 2016-04-22 MED FILL — DESCOVY 200-25 MG TABS: 200-25 | 30 days supply | Qty: 30 | Fill #0

## 2016-04-22 MED FILL — TIVICAY 50 MG TABLET: 50 | 30 days supply | Qty: 60 | Fill #0

## 2016-04-22 MED FILL — NORVIR 100 MG TABLET: 100 | 30 days supply | Qty: 60 | Fill #0

## 2016-04-22 MED FILL — ZIDOVUDINE 300 MG TABLET: 300 | 30 days supply | Qty: 60 | Fill #0

## 2016-04-22 MED FILL — PREZISTA 600 MG TABS: 600 | 30 days supply | Qty: 60 | Fill #0

## 2016-05-03 ENCOUNTER — Ambulatory Visit
Admission: RE | Admit: 2016-05-03 | Discharge: 2016-05-03 | Disposition: A | Discharge status: Home / Self Care | Payer: Medicaid Other | Source: Ambulatory Visit | Attending: Infectious Disease | Admitting: Infectious Disease

## 2016-05-03 ENCOUNTER — Encounter: Payer: Self-pay | Admitting: Pharmacist

## 2016-05-03 DIAGNOSIS — G8929 Other chronic pain: Secondary | ICD-10-CM

## 2016-05-03 DIAGNOSIS — M5441 Lumbago with sciatica, right side: Secondary | ICD-10-CM

## 2016-05-03 DIAGNOSIS — B20 Human immunodeficiency virus [HIV] disease: Secondary | ICD-10-CM

## 2016-05-03 NOTE — Progress Notes (Signed)
Brandon [name] came in today and I refilled his pill box for him.  Will call him on Monday for a f/u appointment.

## 2016-05-07 ENCOUNTER — Ambulatory Visit: Payer: Medicaid Other | Admitting: Infectious Disease

## 2016-05-09 ENCOUNTER — Ambulatory Visit: Payer: Medicaid Other | Admitting: Infectious Disease

## 2016-05-21 MED FILL — NORVIR 100 MG TABLET: 100 | 30 days supply | Qty: 60 | Fill #1

## 2016-05-21 MED FILL — TIVICAY 50 MG TABLET: 50 | 30 days supply | Qty: 60 | Fill #1

## 2016-05-21 MED FILL — PREZISTA 600 MG TABS: 600 | 30 days supply | Qty: 60 | Fill #1

## 2016-05-21 MED FILL — DESCOVY 200-25 MG TABS: 200-25 | 30 days supply | Qty: 30 | Fill #1

## 2016-05-21 MED FILL — ZIDOVUDINE 300 MG TABLET: 300 | 30 days supply | Qty: 60 | Fill #1

## 2016-05-30 ENCOUNTER — Telehealth: Payer: Self-pay | Admitting: Pharmacist Clinician (PhC)/ Clinical Pharmacy Specialist

## 2016-05-30 NOTE — Telephone Encounter (Signed)
Pill boxes refilled. He'll pick up this PM.

## 2016-06-06 ENCOUNTER — Ambulatory Visit: Payer: Medicaid Other

## 2016-06-17 MED FILL — PREZISTA 600 MG TABS: 600 | 30 days supply | Qty: 60 | Fill #2

## 2016-06-17 MED FILL — DESCOVY 200-25 MG TABS: 200-25 | 30 days supply | Qty: 30 | Fill #2

## 2016-06-17 MED FILL — RITONAVIR 100 MG TAB: 100 | 30 days supply | Qty: 60 | Fill #2

## 2016-06-17 MED FILL — ZIDOVUDINE 300 MG TABLET: 300 | 30 days supply | Qty: 60 | Fill #2

## 2016-06-17 MED FILL — TIVICAY 50 MG TABLET: 50 | 30 days supply | Qty: 60 | Fill #2

## 2016-07-16 ENCOUNTER — Other Ambulatory Visit: Payer: Self-pay | Admitting: Pharmacist

## 2016-07-16 DIAGNOSIS — B20 Human immunodeficiency virus [HIV] disease: Secondary | ICD-10-CM

## 2016-07-16 MED ORDER — ZIDOVUDINE 300 MG PO TABS: 300.0000 mg | ORAL_TABLET | Freq: Two times a day (BID) | ORAL | 5 refills | Status: DC

## 2016-07-16 MED ORDER — EMTRICITABINE-TENOFOVIR AF 200-25 MG PO TABS: 1.0000 | ORAL_TABLET | Freq: Every day | ORAL | 5 refills | Status: DC

## 2016-07-16 MED ORDER — DOLUTEGRAVIR SODIUM 50 MG PO TABS: 50.0000 mg | ORAL_TABLET | Freq: Two times a day (BID) | ORAL | 5 refills | Status: DC

## 2016-07-16 MED ORDER — DARUNAVIR ETHANOLATE 600 MG PO TABS: 600.0000 mg | ORAL_TABLET | Freq: Two times a day (BID) | ORAL | 5 refills | Status: DC

## 2016-07-16 MED ORDER — RITONAVIR 100 MG PO TABS: 100.0000 mg | ORAL_TABLET | Freq: Two times a day (BID) | ORAL | 5 refills | Status: DC

## 2016-07-16 MED FILL — RITONAVIR 100 MG TAB: 100 | 30 days supply | Qty: 60 | Fill #3

## 2016-07-16 MED FILL — DESCOVY 200-25 MG TABS: 200-25 | 30 days supply | Qty: 30 | Fill #0

## 2016-07-16 MED FILL — TIVICAY 50 MG TABLET: 50 | 30 days supply | Qty: 60 | Fill #0

## 2016-07-16 MED FILL — ZIDOVUDINE 300 MG TABLET: 300 | 30 days supply | Qty: 60 | Fill #0

## 2016-07-17 ENCOUNTER — Telehealth: Payer: Self-pay | Admitting: Pharmacist Clinician (PhC)/ Clinical Pharmacy Specialist

## 2016-07-17 NOTE — Telephone Encounter (Signed)
Call to ask when he could come in to get his pill boxes refill. He has about 10 days left. He will drop off next Thursday AM and pick up in PM. We will repeat labs at that time.

## 2016-07-24 ENCOUNTER — Telehealth: Payer: Self-pay | Admitting: Pharmacist Clinician (PhC)/ Clinical Pharmacy Specialist

## 2016-07-24 NOTE — Telephone Encounter (Signed)
Pill boxes are refilled today. He'll pick up tomorrow. Will do labs for him, too.

## 2016-07-25 ENCOUNTER — Ambulatory Visit: Payer: Medicaid Other

## 2016-07-25 ENCOUNTER — Other Ambulatory Visit: Payer: Medicaid Other

## 2016-07-25 ENCOUNTER — Other Ambulatory Visit: Payer: Self-pay | Admitting: Pharmacist Clinician (PhC)/ Clinical Pharmacy Specialist

## 2016-07-25 DIAGNOSIS — B20 Human immunodeficiency virus [HIV] disease: Secondary | ICD-10-CM

## 2016-07-25 LAB — COMPLETE METABOLIC PANEL WITH GFR
ALT: 4 U/L — AB (ref 9–46)
AST: 12 U/L (ref 10–40)
Albumin: 4.4 g/dL (ref 3.6–5.1)
Alkaline Phosphatase: 77 U/L (ref 40–115)
BUN: 13 mg/dL (ref 7–25)
CHLORIDE: 106 mmol/L (ref 98–110)
CO2: 25 mmol/L (ref 20–31)
CREATININE: 1.41 mg/dL — AB (ref 0.60–1.35)
Calcium: 9.1 mg/dL (ref 8.6–10.3)
GFR, Est African American: 68 mL/min (ref >=60)
GFR, Est Non African American: 58 mL/min — ABNORMAL LOW (ref >=60)
Glucose, Bld: 73 mg/dL (ref 65–99)
POTASSIUM: 3.9 mmol/L (ref 3.5–5.3)
Sodium: 142 mmol/L (ref 135–146)
Total Bilirubin: 0.6 mg/dL (ref 0.2–1.2)
Total Protein: 6.8 g/dL (ref 6.1–8.1)

## 2016-07-25 LAB — CBC WITH DIFFERENTIAL/PLATELET
BASOS PCT: 1 %
Basophils Absolute: 53 cells/uL (ref 0–200)
EOS PCT: 4 %
Eosinophils Absolute: 212 cells/uL (ref 15–500)
HCT: 39.4 % (ref 38.5–50.0)
Hemoglobin: 13.7 g/dL (ref 13.2–17.1)
LYMPHS PCT: 50 %
Lymphs Abs: 2650 cells/uL (ref 850–3900)
MCH: 40.9 pg — ABNORMAL HIGH (ref 27.0–33.0)
MCHC: 34.8 g/dL (ref 32.0–36.0)
MCV: 117.6 fL — ABNORMAL HIGH (ref 80.0–100.0)
MONOS PCT: 9 %
MPV: 10.6 fL (ref 7.5–12.5)
Monocytes Absolute: 477 cells/uL (ref 200–950)
Neutro Abs: 1908 cells/uL (ref 1500–7800)
Neutrophils Relative %: 36 %
PLATELETS: 217 10*3/uL (ref 140–400)
RBC: 3.35 MIL/uL — AB (ref 4.20–5.80)
RDW: 14.3 % (ref 11.0–15.0)
WBC: 5.3 10*3/uL (ref 3.8–10.8)

## 2016-07-25 MED ORDER — OPIUM 10 MG/ML (1%) PO TINC: 6.0000 mg | Freq: Four times a day (QID) | ORAL | 0 refills | Status: DC

## 2016-07-25 NOTE — Progress Notes (Signed)
Taim needs a refill of his tincture of opium. He says that it's working for him. Will print the prescription out for Dr. Tommy Medal to sign. Since he has not had labs in a while, we'll get them today.

## 2016-07-26 LAB — RPR

## 2016-07-26 LAB — T-HELPER CELL (CD4) - (RCID CLINIC ONLY)
CD4 T CELL HELPER: 14 % — AB (ref 33–55)
CD4 T Cell Abs: 410 /uL (ref 400–2700)

## 2016-07-26 NOTE — Progress Notes (Signed)
Thanks Minh! 

## 2016-07-27 LAB — HIV-1 RNA QUANT-NO REFLEX-BLD
HIV 1 RNA QUANT: 37 {copies}/mL — AB
HIV-1 RNA QUANT, LOG: 1.57 {Log_copies}/mL — AB

## 2016-07-30 MED FILL — OPIUM TINCTURE 10 MG/ML: 10 MG/ML | 30 days supply | Qty: 72 | Fill #0

## 2016-08-13 MED FILL — RITONAVIR 100 MG TAB: 100 | 30 days supply | Qty: 60 | Fill #0

## 2016-08-13 MED FILL — DESCOVY 200-25 MG TABS: 200-25 | 30 days supply | Qty: 30 | Fill #1

## 2016-08-13 MED FILL — TIVICAY 50 MG TABLET: 50 | 30 days supply | Qty: 60 | Fill #1

## 2016-08-13 MED FILL — PREZISTA 600 MG TABS: 600 | 30 days supply | Qty: 60 | Fill #0

## 2016-08-13 MED FILL — ZIDOVUDINE 300 MG TABLET: 300 | 30 days supply | Qty: 60 | Fill #1

## 2016-08-22 ENCOUNTER — Telehealth: Payer: Self-pay | Admitting: Pharmacist Clinician (PhC)/ Clinical Pharmacy Specialist

## 2016-08-22 NOTE — Telephone Encounter (Signed)
Pill boxes refill

## 2016-09-09 MED FILL — ZIDOVUDINE 300 MG TABLET: 300 | 30 days supply | Qty: 60 | Fill #2

## 2016-09-09 MED FILL — DESCOVY 200-25 MG TABS: 200-25 | 30 days supply | Qty: 30 | Fill #2

## 2016-09-09 MED FILL — TIVICAY 50 MG TABLET: 50 | 30 days supply | Qty: 60 | Fill #2

## 2016-09-09 MED FILL — RITONAVIR 100 MG TAB: 100 | 30 days supply | Qty: 60 | Fill #1

## 2016-09-09 MED FILL — PREZISTA 600 MG TABS: 600 | 30 days supply | Qty: 60 | Fill #1

## 2016-09-12 ENCOUNTER — Telehealth: Payer: Self-pay | Admitting: Pharmacist Clinician (PhC)/ Clinical Pharmacy Specialist

## 2016-09-12 NOTE — Telephone Encounter (Signed)
Monthly pill boxes refilled.

## 2016-10-07 MED FILL — PREZISTA 600 MG TABS: 600 | 30 days supply | Qty: 60 | Fill #2

## 2016-10-07 MED FILL — DESCOVY 200-25 MG TABS: 200-25 | 30 days supply | Qty: 30 | Fill #3

## 2016-10-07 MED FILL — RITONAVIR 100 MG TAB: 100 | 30 days supply | Qty: 60 | Fill #2

## 2016-10-07 MED FILL — ZIDOVUDINE 300 MG TABLET: 300 | 30 days supply | Qty: 60 | Fill #3

## 2016-10-07 MED FILL — TIVICAY 50 MG TABLET: 50 | 30 days supply | Qty: 60 | Fill #3

## 2016-10-10 ENCOUNTER — Telehealth: Payer: Self-pay | Admitting: Pharmacist Clinician (PhC)/ Clinical Pharmacy Specialist

## 2016-10-10 NOTE — Telephone Encounter (Signed)
AZT 300mg  BID DTG 50mg  BID Descovy 1 qday DRV/r 600/100mg  BID

## 2016-11-04 MED FILL — TIVICAY 50 MG TABLET: 50 | 30 days supply | Qty: 60 | Fill #4

## 2016-11-04 MED FILL — PREZISTA 600 MG TABS: 600 | 30 days supply | Qty: 60 | Fill #3

## 2016-11-04 MED FILL — ZIDOVUDINE 300 MG TABLET: 300 | 30 days supply | Qty: 60 | Fill #4

## 2016-11-04 MED FILL — RITONAVIR 100 MG TAB: 100 | 30 days supply | Qty: 60 | Fill #3

## 2016-11-04 MED FILL — DESCOVY 200-25 MG TABS: 200-25 | 30 days supply | Qty: 30 | Fill #4

## 2016-11-07 ENCOUNTER — Ambulatory Visit: Payer: Medicaid Other

## 2016-11-08 ENCOUNTER — Other Ambulatory Visit: Payer: Self-pay | Admitting: Pharmacist

## 2016-12-04 MED FILL — ZIDOVUDINE 300 MG TABLET: 300 | 30 days supply | Qty: 60 | Fill #5

## 2016-12-04 MED FILL — PREZISTA 600 MG TABS: 600 | 30 days supply | Qty: 60 | Fill #4

## 2016-12-04 MED FILL — DESCOVY 200-25 MG TABS: 200-25 | 30 days supply | Qty: 30 | Fill #5

## 2016-12-04 MED FILL — RITONAVIR 100 MG TAB: 100 | 30 days supply | Qty: 60 | Fill #4

## 2016-12-04 MED FILL — TIVICAY 50 MG TABLET: 50 | 30 days supply | Qty: 60 | Fill #5

## 2016-12-09 ENCOUNTER — Other Ambulatory Visit: Payer: Self-pay | Admitting: Infectious Disease

## 2016-12-09 ENCOUNTER — Ambulatory Visit (INDEPENDENT_AMBULATORY_CARE_PROVIDER_SITE_OTHER): Payer: Medicaid Other | Admitting: Pharmacist Clinician (PhC)/ Clinical Pharmacy Specialist

## 2016-12-09 ENCOUNTER — Other Ambulatory Visit: Payer: Medicaid Other

## 2016-12-09 DIAGNOSIS — B20 Human immunodeficiency virus [HIV] disease: Secondary | ICD-10-CM

## 2016-12-09 DIAGNOSIS — Z23 Encounter for immunization: Secondary | ICD-10-CM

## 2016-12-09 DIAGNOSIS — Z79899 Other long term (current) drug therapy: Secondary | ICD-10-CM

## 2016-12-09 LAB — CBC WITH DIFFERENTIAL/PLATELET
Basophils Absolute: 52 cells/uL (ref 0–200)
Basophils Relative: 0.9 %
EOS PCT: 4 %
Eosinophils Absolute: 232 cells/uL (ref 15–500)
HEMATOCRIT: 38.5 % (ref 38.5–50.0)
Hemoglobin: 13.5 g/dL (ref 13.2–17.1)
LYMPHS ABS: 3057 {cells}/uL (ref 850–3900)
MCH: 37.9 pg — ABNORMAL HIGH (ref 27.0–33.0)
MCHC: 35.1 g/dL (ref 32.0–36.0)
MCV: 108.1 fL — ABNORMAL HIGH (ref 80.0–100.0)
MPV: 11.3 fL (ref 7.5–12.5)
Monocytes Relative: 9.6 %
NEUTROS PCT: 32.8 %
Neutro Abs: 1902 cells/uL (ref 1500–7800)
PLATELETS: 182 10*3/uL (ref 140–400)
RBC: 3.56 10*6/uL — AB (ref 4.20–5.80)
RDW: 12 % (ref 11.0–15.0)
Total Lymphocyte: 52.7 %
WBC mixed population: 557 cells/uL (ref 200–950)
WBC: 5.8 10*3/uL (ref 3.8–10.8)

## 2016-12-09 LAB — COMPREHENSIVE METABOLIC PANEL
AG Ratio: 1.8 (calc) (ref 1.0–2.5)
ALBUMIN MSPROF: 4.2 g/dL (ref 3.6–5.1)
ALKALINE PHOSPHATASE (APISO): 71 U/L (ref 40–115)
ALT: 8 U/L — AB (ref 9–46)
AST: 14 U/L (ref 10–40)
BILIRUBIN TOTAL: 0.4 mg/dL (ref 0.2–1.2)
BUN/Creatinine Ratio: 12 (calc) (ref 6–22)
BUN: 16 mg/dL (ref 7–25)
CALCIUM: 8.9 mg/dL (ref 8.6–10.3)
CO2: 29 mmol/L (ref 20–32)
Chloride: 106 mmol/L (ref 98–110)
Creat: 1.39 mg/dL — ABNORMAL HIGH (ref 0.60–1.35)
Globulin: 2.4 g/dL (calc) (ref 1.9–3.7)
Glucose, Bld: 92 mg/dL (ref 65–99)
POTASSIUM: 4.2 mmol/L (ref 3.5–5.3)
Sodium: 141 mmol/L (ref 135–146)
Total Protein: 6.6 g/dL (ref 6.1–8.1)

## 2016-12-09 LAB — LIPID PANEL
Cholesterol: 125 mg/dL (ref <200)
HDL: 35 mg/dL — AB (ref >40)
LDL Cholesterol (Calc): 66 mg/dL (calc)
NON-HDL CHOLESTEROL (CALC): 90 mg/dL (ref <130)
TRIGLYCERIDES: 165 mg/dL — AB (ref <150)
Total CHOL/HDL Ratio: 3.6 (calc) (ref <5.0)

## 2016-12-09 MED ORDER — ZIDOVUDINE 300 MG PO TABS: 300.0000 mg | ORAL_TABLET | Freq: Two times a day (BID) | ORAL | 5 refills | Status: DC

## 2016-12-09 MED ORDER — DOLUTEGRAVIR SODIUM 50 MG PO TABS: 50.0000 mg | ORAL_TABLET | Freq: Two times a day (BID) | ORAL | 5 refills | Status: DC

## 2016-12-09 MED ORDER — DARUNAVIR ETHANOLATE 600 MG PO TABS: 600.0000 mg | ORAL_TABLET | Freq: Two times a day (BID) | ORAL | 5 refills | Status: DC

## 2016-12-09 MED ORDER — EMTRICITABINE-TENOFOVIR AF 200-25 MG PO TABS: 1.0000 | ORAL_TABLET | Freq: Every day | ORAL | 5 refills | Status: DC

## 2016-12-09 MED ORDER — RITONAVIR 100 MG PO TABS: 100.0000 mg | ORAL_TABLET | Freq: Two times a day (BID) | ORAL | 5 refills | Status: DC

## 2016-12-09 NOTE — Progress Notes (Signed)
HPI: Brandon [name] is a 51 y.o. male who is here for his labs and pill boxes refill visit with pharmacy.   Allergies: Allergies  Allergen Reactions  . Bactrim [Sulfamethoxazole-Trimethoprim]   . Bee Venom Anaphylaxis  . Sulfa Antibiotics Anaphylaxis  . Truvada [Emtricitabine-Tenofovir Df] Anaphylaxis and Rash    Takes plain tenofovir at home  . Lidoderm [Lidocaine] Other (See Comments)    Reaction unknown  . Raltegravir     resistance  . Ceftriaxone Rash  . Sulfamethoxazole Itching, Other (See Comments) and Rash    Other reaction(s): Hypotension (ALLERGY/intolerance)    Vitals:    Past Medical History: Past Medical History:  Diagnosis Date  . Abdominal pain   . Anemia   . Arthritis   . Back pain 02/13/2016  . Constipation   . Diarrhea   . Foot lesion 12/19/2014  . Gallstones   . Gastric AVM   . GERD (gastroesophageal reflux disease)   . GI bleed   . HIV (human immunodeficiency virus infection) ([place])   . Hypertension   . IBS (irritable bowel syndrome)   . Infectious colitis   . Interstitial cystitis   . Mechanical heart valve present   . Nausea & vomiting   . Pancreatitis   . 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 Stroke ([place])   . Weight loss, unintentional     Social History: Social History   Social History  . Marital status: Married    Spouse name: N/A  . Number of children: 6  . Years of education: N/A   Occupational History  . disability rep    Social History Main Topics  . Smoking status: Current Every Day Smoker    Packs/day: 0.10    Years: 20.00    Types: Cigars, Cigarettes  . Smokeless tobacco: Never Used     Comment: cutting back  . Alcohol use 0.0 oz/week     Comment: 12oz beer/ per week   . Drug use: Yes    Frequency: 7.0 times per week    Types: Marijuana     Comment: daily 2 joints   . Sexual activity: Yes    Partners: Female    Birth control/ protection: Condom   Other Topics Concern  . Not on file    Social History Narrative  . No narrative on file    Previous Regimen: Tivicay BID Prezista  BID Norvir BID Combivir BID Viread BID  Current Regimen: Tiivcay BID + Prezista BID + Norvir BID + Retrovir BID + Descovy  Labs: HIV 1 RNA Quant (copies/mL)  Date Value  07/25/2016 37 (H)  08/14/2015 1,124 (H)  03/21/2015 <20   CD4 T Cell Abs (/uL)  Date Value  07/25/2016 410  03/26/2016 520  02/13/2016 430   Hep B S Ab (no units)  Date Value  05/14/2012 NONREACTIVE   Hepatitis B Surface Ag (no units)  Date Value  05/14/2012 NEGATIVE   HCV Ab (no units)  Date Value  05/14/2012 NEGATIVE    CrCl: CrCl cannot be calculated (Patient's most recent lab result is older than the maximum 21 days allowed.).  Lipids:    Component Value Date/Time   CHOL 99 07/18/2014 1601   TRIG 87 07/18/2014 1601   HDL 27 (L) 07/18/2014 1601   CHOLHDL 3.7 07/18/2014 1601   VLDL 17 07/18/2014 1601   LDLCALC 55 07/18/2014 1601   HIV Genotype Composite Data Genotype Dates:   Mutations in Bold impact drug susceptibility RT Mutations M41L, L74LV, T215D,  Y181YC, P824MP  PI Mutations M46I, I47V, I54IV, I84IV, L10F, F53L, T74TP, I13IV, K20KIM, D60E, I62V, L63P, A71AV, V77I  Integrase Mutations G140S, Q148H   Interpretation of Genotype Data per [employer] HIV Database Nucleoside RTIs  abacavir (ABC) Intermediate Resistance zidovudine (AZT) Intermediate Resistance emtricitabine (FTC) Susceptible lamivudine (3TC) Susceptible tenofovir (TDF) Low-Level Resistance   Non-Nucleoside RTIs  efavirenz (EFV) Intermediate Resistance etravirine (ETR) Intermediate Resistance nevirapine (NVP) High-Level Resistance rilpivirine (RPV) Intermediate Resistance   Protease Inhibitors  atazanavir/r (ATV/r) High-Level Resistance darunavir/r (DRV/r) Intermediate Resistance lopinavir/r (LPV/r) High-Level Resistance   Integrase Inhibitors  bictegravir (BIC) Intermediate Resistance dolutegravir  (DTG) Intermediate Resistance elvitegravir (EVG) High-Level Resistance raltegravir (RAL) High-Level Resistance    Assessment: Brandon [name] is here for his routine pill boxes refill. He is doing pretty well on it except missing a couple of days due to being sick. I think most of the problem that he has had in the past had been with with nausea. After doing his medication reconciliation, he has been taking his DRV/r sometimes on an empty stomach. Counseled him on taking with with food because it helps with the absorption and could potentially decrease the side effects. He'll do it from now on. He is getting labs today before the visit with Dr. Tommy Medal but there were no orders put in so orders are placed today.   He has a ton of resistance so he asked if could switch to one of the new med as part of his regimen. Due to his resistance pattern, will let Dr. Tommy Medal make the call. He has dual tropic virus. Genotypes from [facility] are photographed under media. I tried to put together as much resistance as I could in the table above. I don't think once daily DRV will be feasible with his mutations. Maybe the addition of doravarine if he ever fail the current regimen.  Offered him the flu and to start the hep B series but he would like to wait until the visit with Dr. Tommy Medal to get both. He agreed to the second shot of Menveo today.   Recommendations:  HIV VL, CD4, CMET, lipids, GC Cont current regimen Flu and hep B at the next visit Second Menveo today Pill boxes filled  Onnie Boer, PharmD, BCPS, AAHIVP, CPP Clinical Infectious [place] for Infectious Disease 12/09/2016, 3:34 PM

## 2016-12-09 NOTE — Patient Instructions (Signed)
Continue your current regimen Follow with Dr. Tommy Medal in 2 weeks

## 2016-12-10 LAB — T-HELPER CELL (CD4) - (RCID CLINIC ONLY)
CD4 T CELL ABS: 530 /uL (ref 400–2700)
CD4 T CELL HELPER: 16 % — AB (ref 33–55)

## 2016-12-11 LAB — HIV-1 RNA QUANT-NO REFLEX-BLD
HIV 1 RNA Quant: <20 copies/mL — AB
HIV-1 RNA Quant, Log: <1.3 Log copies/mL — AB

## 2016-12-25 ENCOUNTER — Ambulatory Visit (INDEPENDENT_AMBULATORY_CARE_PROVIDER_SITE_OTHER): Payer: Medicaid Other | Admitting: Infectious Disease

## 2016-12-25 ENCOUNTER — Encounter: Payer: Self-pay | Admitting: Infectious Disease

## 2016-12-25 VITALS — BP 132/80 | HR 98 | Temp 99.3°F | Ht 68.0 in | Wt 123.0 lb

## 2016-12-25 DIAGNOSIS — Z21 Asymptomatic human immunodeficiency virus [HIV] infection status: Secondary | ICD-10-CM | POA: Diagnosis not present

## 2016-12-25 DIAGNOSIS — Z952 Presence of prosthetic heart valve: Secondary | ICD-10-CM | POA: Diagnosis not present

## 2016-12-25 DIAGNOSIS — R112 Nausea with vomiting, unspecified: Secondary | ICD-10-CM | POA: Diagnosis not present

## 2016-12-25 DIAGNOSIS — B20 Human immunodeficiency virus [HIV] disease: Secondary | ICD-10-CM | POA: Diagnosis not present

## 2016-12-25 DIAGNOSIS — Z23 Encounter for immunization: Secondary | ICD-10-CM

## 2016-12-25 NOTE — Progress Notes (Signed)
HPI: Brandon [name] is a 51 y.o. male who is here for his HIV f/u with Dr. Tommy Medal.   Allergies: Allergies  Allergen Reactions  . Bactrim [Sulfamethoxazole-Trimethoprim]   . Bee Venom Anaphylaxis  . Sulfa Antibiotics Anaphylaxis  . Truvada [Emtricitabine-Tenofovir Df] Anaphylaxis and Rash    Takes plain tenofovir at home  . Lidoderm [Lidocaine] Other (See Comments)    Reaction unknown  . Raltegravir     resistance  . Ceftriaxone Rash  . Sulfamethoxazole Itching, Other (See Comments) and Rash    Other reaction(s): Hypotension (ALLERGY/intolerance)    Vitals: Temp: 99.3 F (37.4 C) (10/17 1541) Temp Source: Oral (10/17 1541) BP: 132/80 (10/17 1541) Pulse Rate: 98 (10/17 1541)  Past Medical History: Past Medical History:  Diagnosis Date  . Abdominal pain   . Anemia   . Arthritis   . Back pain 02/13/2016  . Constipation   . Diarrhea   . Foot lesion 12/19/2014  . Gallstones   . Gastric AVM   . GERD (gastroesophageal reflux disease)   . GI bleed   . HIV (human immunodeficiency virus infection) ([place])   . Hypertension   . IBS (irritable bowel syndrome)   . Infectious colitis   . Interstitial cystitis   . Mechanical heart valve present   . Nausea & vomiting   . Pancreatitis   . 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 Stroke ([place])   . Weight loss, unintentional     Social History: Social History   Social History  . Marital status: Married    Spouse name: N/A  . Number of children: 6  . Years of education: N/A   Occupational History  . disability rep    Social History Main Topics  . Smoking status: Current Every Day Smoker    Packs/day: 0.10    Years: 20.00    Types: Cigars, Cigarettes  . Smokeless tobacco: Never Used     Comment: cutting back  . Alcohol use 0.0 oz/week     Comment: 12oz beer/ per week   . Drug use: Yes    Frequency: 7.0 times per week    Types: Marijuana     Comment: daily 2 joints   . Sexual activity: Yes     Partners: Female    Birth control/ protection: Condom   Other Topics Concern  . None   Social History Narrative  . None    Previous Regimen: Multiple  Current Regimen: AZT 300mg  BID DTG 50mg  BID Descovy 1 qday DRV/r 600/100mg  BID  Labs: HIV 1 RNA Quant (copies/mL)  Date Value  12/09/2016 <20 DETECTED (A)  07/25/2016 37 (H)  08/14/2015 1,124 (H)   CD4 T Cell Abs (/uL)  Date Value  12/09/2016 530  07/25/2016 410  03/26/2016 520   Hep B S Ab (no units)  Date Value  05/14/2012 NONREACTIVE   Hepatitis B Surface Ag (no units)  Date Value  05/14/2012 NEGATIVE   HCV Ab (no units)  Date Value  05/14/2012 NEGATIVE    CrCl: Estimated Creatinine Clearance: 50.7 mL/min (A) (by C-G formula based on SCr of 1.39 mg/dL (H)).  Lipids:    Component Value Date/Time   CHOL 125 12/09/2016 1521   TRIG 165 (H) 12/09/2016 1521   HDL 35 (L) 12/09/2016 1521   CHOLHDL 3.6 12/09/2016 1521   VLDL 17 07/18/2014 1601   LDLCALC 55 07/18/2014 1601   HIV Genotype Composite Data Genotype Dates:   Mutations in Bold impact drug susceptibility RT Mutations  M41ML, D67N, L74LV, M184V, L210W, T215DY, L100I, K101E, K103N, Y181YC, G190S  PI Mutations M46MI, I47IV, I50L, I54IV, V82A, I84IV, L90M, L10LEFI, F53FL, T74TP  Integrase Mutations G140S, Q148H   Interpretation of Genotype Data per [employer] HIV Database Nucleoside RTIs  abacavir (ABC) High-Level Resistance zidovudine (AZT) High-Level Resistance emtricitabine (FTC) High-Level Resistance lamivudine (3TC) High-Level Resistance tenofovir (TDF) Intermediate Resistance   Non-Nucleoside RTIs  efavirenz (EFV) High-Level Resistance etravirine (ETR) High-Level Resistance nevirapine (NVP) High-Level Resistance rilpivirine (RPV) High-Level Resistance   Protease Inhibitors  atazanavir/r (ATV/r) High-Level Resistance darunavir/r (DRV/r) Intermediate Resistance lopinavir/r (LPV/r) High-Level Resistance   Integrase Inhibitors   bictegravir (BIC) Intermediate Resistance dolutegravir (DTG) Intermediate Resistance elvitegravir (EVG) High-Level Resistance raltegravir (RAL) High-Level Resistance   Assessment: Brandon [name] is well known to [place] here at the clinic because of his extensive resistance. There were mutations that I didn't find in the previous chart. 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 He is suppressed on the current regimen that he is on. However, he asked [place] if there is a way to change part of his regimen due to various symptoms. He stated that he has to drink so much water to stay hydrated. Dr. Tommy Medal was considering dropping part of his regimen (PI) and substitute with the newly released Doravirine. 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 Dropping PI will bring a lot of concerns. Dr. Tommy Medal will do an Archive Geno today to see if we can't pick up anymore mutations before even attempting to streamline his regimen.   Recommendations:  Cont current regimen for now Archive genotype to dig for mutations Possible change to his regimen in the near future  Onnie Boer, PharmD, BCPS, AAHIVP, [facility] 12/25/2016, 10:23 PM

## 2016-12-25 NOTE — Progress Notes (Signed)
Chief complaint: Suffering from nausea from regimen and had to miss 3 days of it this past weekend, and wants to be on a regimen without Prezista and Norvir  Subjective:    Patient ID: Brandon [name], male    DOB: August 31, 1965, 51 y.o.   MRN: [id number]  HPI  Brandon [name] is a highly complicated man with history of  HIV/AIDS and Multi-DRUG RESISTANT virus formerly followed at [facility]. His HIV nadir was  20 when we first met him   He had been on various complicated antiretroviral regimens in the past, with unfortunate GENOTYPIC resistance to all non-nucleoside reverse transcriptase inhibitors and all NRTIs, Resistance to all protease inhibitors with the exception of Prezista which had some activity genotypically,, Resistance to Isentress, and Elvitegravir and  reduced S to dolutegravir having both a 148H and 140S  and with Dual tropic virus.  02/12/2010 phenotype at [facility] showed:  RT: NRTI: ABC, DDI, D4T, AZT, TDF: resistant; 3TC, FTC: susceptible; NNRTI: EFV susceptible; RPV, NVP, ETR, DLV: Resistant; PI: pan-resistant  He had decided nto go back onto [facility] and WAS  referred to [facility].  We  Had  seen him and placed him on a  salvage regimen of Prezista 623m  Twice daily boosted with Norvir 1068mtwice daily, Tivicay twice daily, Combivir twice daily and once daily Viread.  And since then he haD BEEN WITH AN UNDETECTABLE VIRAL LOAD <20 FOR MORE THAN THREE  YEARS  And  Healthy CD4 count.  Unfortunately his summers viral load popped up into the thousands though resistance testing failed to show any new evidence of resistance. I wanted to see him shortly thereafter but he has not been seen in clinic until today when he came in accompanied his wife to her visit.   We have since checked him again in his viral load has come down to 691 copies.  Resistance testing on this virus also failed to show any new resistance mutations.  We were able to re-suppress him and he has been undetectable when  checked on Oct 1 on regimen of   --Tivicay 5025mID --Prezista 600m25mD with  --Norvir 100mg91m -AZT  BID --DESCOVY q daily       Lab Results  Component Value Date   HIV1RNAQUANT <20 DETECTED (A) 12/09/2016   HIV1RNAQUANT 37 (H) 07/25/2016   HIV1RNAQUANT 1,124 (H) 08/14/2015    Lab Results  Component Value Date   CD4TABS 530 12/09/2016   CD4TABS 410 07/25/2016   CD4TABS 520 03/26/2016    I went back and retrieved R from [facility] with Pheno/Geno R data that [place] to find from scanned documents.  Putting these together and plugging into [employer] yielded   Drug Resistance Interpretation: PR  PI Major Resistance Mutations:  M46MI, I47IV, I50L, I54IV, V82A, I84IV, L90M  PI Accessory Resistance Mutations:  L10LEFI, F53FL, T74TP  Other Mutations:  I13IV, K20KIM, D60E, I62V, L63P, A71AV, V77I  Protease Inhibitors  atazanavir/r (ATV/r) High-Level Resistance darunavir/r (DRV/r) Intermediate Resistance lopinavir/r (LPV/r) High-Level Resistance  PR Comments PI Major M46I/L are relatively non-polymorphic PI-selected mutations. In combination with other PI-resistance mutations, they are associated with reduced susceptibility to each of the PIs except DRV.  I47V = DRAM is a non-polymorphic PI-selected mutation associated with reduced susceptibility to each of the PIs except SQV and ATV. I47V is included in the [employer] DRV genotypic susceptibility score.  I50L is a non-polymorphic mutation selected by  ATV. It causes high-level resistance to ATV and increases susceptibility to most of the remaining PIs.  I54V is a non-polymorphic PI-selected mutation that contributes reduced susceptibility to each of the PIs except DRV.  V82A is a non-polymorphic mutation selected primarily by IDV and LPV. It reduces susceptibility to these PIs and contributes cross-resistance to each of the remaining PIs except DRV and  TPV.  I84V =DRAM  is a non-polymorphic mutation selected by each of the PIs. It causes high-level resistance to ATV, FPV, IDV, NFV and SQV, intermediate resistance to LPV and TPV, and low-level resistance to DRV.  L90M is a non-polymorphic PI-selected mutation that reduces susceptibility to each of the PIs except TPV and DRV. PI Accessory  L10F is a common non-polymorphic, PI-selected accessory mutation associated with reduced susceptibility to DRV, FPV, IDV, LPV, and NFV.  F53L is a non-polymorphic accessory PI-selected mutation that reduces susceptibility primarily to ATV, SQV, and NFV.  T74P = DRAM is a non-polymorphic PI-selected accessory mutation that occurs primarily in viruses from patients who have received multiple PIs. It is associated with reduced susceptibility to each of the PIs. It is included in the [employer] TPV and [employer] DRV genotypic susceptibility scores.   Other L10F/I/V/R/Y are PI-selected accessory mutations. L10E is a highly unusual mutation at this position. L10I/V are polymorphic, PI-selected accessory mutations that increase the replication of viruses with other PI-resistance mutations. K20I is the consensus amino acid in subtype G and [id number]. In subtypes B and C, K20I is a PI-selected accessory mutation that reduces NFV susceptibility. K20M/V are rare, relatively non-polymorphic PI-selected mutations that have not been well studied. A71V/T are polymorphic, PI-selected accessory mutations that increase the replication of viruses with other PI-resistance mutations. Dosage Considerations There is evidence for intermediate DRV resistance. If DRV is administered it should be used twice daily. Mutation Scoring: PR PI ATV/r DRV/r LPV/r M46I 10 0 10 I47V _0 I50L 60 -10 -10 F53L 10 0 0 I54V 15 0 15 T74P _1 V82A 15 0 30 I84V 60 15 30 L90M 25 0 15 M46I + V82A 10 0 10 M46I + I84V + L90M 5 0 5 M46I + L90M 10 0 0 F53L + L90M 10 0 0 I54V +  V82A 10 0 10 I54V + L90M 10 0 5 V82A + L90M 10 0 5 L10F 0 5 5 I47V + I84V 0 5 5 Total 280 30 155  Drug Resistance Interpretation: RT  NRTI Resistance Mutations:  M41ML, D67N, L74LV, M184V, L210W, T215DY  NNRTI Resistance Mutations:  L100I, K101E, K103N, Y181YC, G190S  Other Mutations: T69I, R211RK  Nucleoside Reverse Transcriptase Inhibitors  abacavir (ABC) High-Level Resistance zidovudine (AZT) High-Level Resistance emtricitabine (FTC) High-Level Resistance lamivudine (3TC) High-Level Resistance tenofovir (TDF) Intermediate Resistance Non-nucleoside Reverse Transcriptase Inhibitors efavirenz (EFV) High-Level Resistance etravirine (ETR) High-Level Resistance nevirapine (NVP) High-Level Resistance rilpivirine (RPV) High-Level Resistance  RT Comments NRTI M184V/I cause high-level in vitro resistance to 3TC and FTC and low-level resistance to ddI and ABC. However, M184V/I are not contraindications to continued treatment with 3TC or FTC because they increase susceptibility to AZT, TDF and d4T and are associated with clinically significant reductions in HIV-1 replication. L210W is a TAM that usually occurs in combination with M41L and T215Y. The combination of M41, L210W and T215Y causes high-level resistance to AZT and d4T and intermediate to high-level resistance to ddI, ABC and TDF. T215Y/F cause intermediate/high-level resistance to AZT and d4T, low-level resistance to ddI, and potentially low-level resistance to ABC and  TDF. T215S/C/D/E/I/V/N/A/L do not reduce NRTI susceptibility but arise from viruses that once contained T215Y/F. The presence of one of these revertant mutations suggests that the patient may have once had a majority virus population with T215Y/F. T215Y is a TAM that causes intermediate/high-level resistance to AZT and d4T, low-level resistance to ddI, and potentially low-level resistance to ABC and TDF. M41L is a TAM that usually occurs with T215Y. In  combination, M41L plus T215Y confer intermediate / high-level resistance to AZT and d4T and contribute to reduced ddI, ABC and TDF susceptibility. D67N is a non-polymorphic TAM associated with low-level resistance to AZT and d4T. When present with other TAMs, it contributes reduced susceptibility to ABC, ddI, and TDF. L74V/I cause high-level resistance to ddI and intermediate resistance to ABC.  NNRTI L100I is a non-polymorphic mutation that usually occurs in combination with K103N. It is associated with high-level resistance to NVP, EFV, and RPV and intermediate resistance to ETR. It has a high weight in the [employer] ETR genotypic susceptibility score. K101E is a non-polymorphic primarily accessory mutation that causes intermediate resistance to NVP and RPV, and low-level resistance to EFV and ETR. It has a low weight in the [employer] ETR genotypic susceptibility score. K103N is a non-polymorphic mutation that causes high-level resistance to NVP and EFV. Y181C is a non-polymorphic mutation selected in patients receiving each of the NNRTIs. It causes high-level reduction in NVP susceptibility, intermediate-level reduction in RPV and ETR susceptibility, and low-level reduction in EFV susceptibility. Y181C has a high weight in the [employer] ETR genotypic susceptibility score. G190S is a non-polymorphic mutation that causes high-level resistance to NVP and EFV. It has a low weight in the [employer] ETR genotypic susceptibility score but does not appear to be selected by ETR or RPV or to reduce their in vitro susceptibility in the absence of other NNRTI-resistance mutations. Other T69S/A/I/E are relatively polymorphic mutations weakly selected in patients receiving NRTIs. Their effects on NRTI susceptibility have not been well studied. Mutation Scoring: RT NRTI ABC AZT FTC 3TC TDF M41L 5 15 0 0 5 D67N 5 15 0 0 5 L74V 30 0 0 0 0 M184V 15 -10 60 60 -10 L210W 5 15 0 0 5 T215Y 10 40 0 0 10 M41L + D67N +  T215Y _0 M41L + L210W 10 10 0 0 10 M41L + L210W + T215Y 5 0 _1 M41L + T215Y _2 L74V + M184V 15 0 0 0 0 L210W + T215Y _3 Total 130 110 80 80 55 NNRTI EFV ETR NVP RPV L100I 60 30 60 60 K101E _4 45 K103N 60 0 60 0 Y181C 30 30 60 45 G190S 60 10 60 15  K101E + Y181C _5 0 K101E + G190S 0 5 0 0 Y181C + G190S 0 10 0 10 Total 230 105 275 175 Drug Resistance Interpretation: IN IN Major Resistance Mutations: G140S, Q148H IN Accessory Resistance Mutations: None Other Mutations: None Integrase Strand Transfer Inhibitors  bictegravir (BIC) Intermediate Resistance dolutegravir (DTG) Intermediate Resistance elvitegravir (EVG) High-Level Resistance raltegravir (RAL) High-Level Resistance   IN Comments IN Major G140S/A/C are non-polymorphic mutations that usually occur with Q148 mutations. Alone, they have minimal effects on INSTI susceptibility. However, in combination with Q148 mutations they are associated with high-level resistance to RAL and EVG and intermediate reductions in DTG and BIC susceptibility. Q148H/K/R are non-polymorphic mutations selected by RAL, EVG, and rarely DTG. Q148H/R/K are associated  with high-level reductions in RAL and EVG susceptibility particularly when they occur In combination with E138 or G140 mutations. Alone, Q148H/K/R have minimal effects on DTG and BIC susceptibility. But in combination with E138 and G140 mutations they cause moderate and occasionally high-level reductions in DTG and BIC susceptibility. Dosage Considerations There is evidence for intermediate DTG resistance. If DTG is used, it should be administered twice daily.  Mutation Scoring: IN INSTI BIC DTG EVG RAL G140S _0 Q148H 25 25 60 60 G140S + Q148H 10 10 0 0 Total 45 45 90 90    Past Medical History:  Diagnosis Date  . Abdominal pain   . Anemia   . Arthritis   . Back pain 02/13/2016  . Constipation   . Diarrhea   . Foot lesion  12/19/2014  . Gallstones   . Gastric AVM   . GERD (gastroesophageal reflux disease)   . GI bleed   . HIV (human immunodeficiency virus infection) ([place])   . Hypertension   . IBS (irritable bowel syndrome)   . Infectious colitis   . Interstitial cystitis   . Mechanical heart valve present   . Nausea & vomiting   . Pancreatitis   . 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 Stroke ([place])   . Weight loss, unintentional     Past Surgical History:  Procedure Laterality Date  . AORTIC VALVE REPLACEMENT    . CARDIAC SURGERY    . CHOLECYSTECTOMY  02/12/2012   Procedure: LAPAROSCOPIC CHOLECYSTECTOMY;  Surgeon: Stark Klein, MD;  Location: [facility];  Service: General;  Laterality: N/A;  . ESOPHAGOGASTRODUODENOSCOPY N/A 07/24/2012   Procedure: ESOPHAGOGASTRODUODENOSCOPY (EGD);  Surgeon: Beryle Beams, MD;  Location: [facility] ENDOSCOPY;  Service: Endoscopy;  Laterality: N/A;  . KNEE SURGERY      Family History  Problem Relation Age of Onset  . Hypertension Father   . Cancer - Prostate Father   . Cancer Father        stomach  . Hypertension Sister   . Diabetes Maternal Aunt   . Cancer - Other Cousin   . Parkinson's disease Paternal Aunt       Social History   Social History  . Marital status: Married    Spouse name: N/A  . Number of children: 6  . Years of education: N/A   Occupational History  . disability rep    Social History Main Topics  . Smoking status: Current Every Day Smoker    Packs/day: 0.10    Years: 20.00    Types: Cigars, Cigarettes  . Smokeless tobacco: Never Used     Comment: cutting back  . Alcohol use 0.0 oz/week     Comment: 12oz beer/ per week   . Drug use: Yes    Frequency: 7.0 times per week    Types: Marijuana     Comment: daily 2 joints   . Sexual activity: Yes    Partners: Female    Birth control/ protection: Condom   Other Topics Concern  . None   Social History Narrative  . None    Allergies  Allergen Reactions  . Bactrim  [Sulfamethoxazole-Trimethoprim]   . Bee Venom Anaphylaxis  . Sulfa Antibiotics Anaphylaxis  . Truvada [Emtricitabine-Tenofovir Df] Anaphylaxis and Rash    Takes plain tenofovir at home  . Lidoderm [Lidocaine] Other (See Comments)    Reaction unknown  . Raltegravir     resistance  . Ceftriaxone Rash  . Sulfamethoxazole Itching, Other (See Comments) and Rash  Other reaction(s): Hypotension (ALLERGY/intolerance)     Current Outpatient Prescriptions:  .  darunavir (PREZISTA) 600 MG tablet, Take 1 tablet (600 mg total) by mouth 2 (two) times daily with a meal., Disp: 60 tablet, Rfl: 5 .  dolutegravir (TIVICAY) 50 MG tablet, Take 1 tablet (50 mg total) by mouth 2 (two) times daily., Disp: 60 tablet, Rfl: 5 .  emtricitabine-tenofovir AF (DESCOVY) 200-25 MG tablet, Take 1 tablet by mouth daily., Disp: 30 tablet, Rfl: 5 .  EPINEPHrine (EPI-PEN) 0.3 mg/0.3 mL DEVI, Inject 0.3 mLs (0.3 mg total) into the muscle once., Disp: 1 Device, Rfl: 1 .  escitalopram (LEXAPRO) 20 MG tablet, TAKE 1 TABLET BY MOUTH   DAILY, Disp: 30 tablet, Rfl: 1 .  ondansetron (ZOFRAN ODT) 8 MG disintegrating tablet, Take 1 tablet (8 mg total) by mouth every 8 (eight) hours as needed for nausea or vomiting., Disp: 30 tablet, Rfl: 0 .  Opium 10 MG/ML (1%) TINC, Take 0.6 mLs (6 mg total) by mouth every 6 (six) hours., Disp: 72 mL, Rfl: 0 .  prochlorperazine (COMPAZINE) 10 MG tablet, TAKE 1 TABLET BY MOUTH EVERY 6 HOURS AS NEEDED FOR NAUSEA, Disp: 120 tablet, Rfl: 0 .  ritonavir (NORVIR) 100 MG TABS tablet, Take 1 tablet (100 mg total) by mouth 2 (two) times daily., Disp: 60 tablet, Rfl: 5 .  warfarin (COUMADIN) 1 MG tablet, Take as directed with 59m tablet per [facility]. Take 5 mg once daily on Monday and Friday. Take 6 mg by mouth once daily on Tuesday, Wednesday, Thursday, Saturday, and Sunday., Disp: , Rfl:  .  zidovudine (RETROVIR) 300 MG tablet, Take 1 tablet (300 mg total) by mouth 2 (two) times daily., Disp: 60  tablet, Rfl: 5 .  ENSURE (ENSURE), Take 237 mLs by mouth 2 (two) times daily between meals. Provide a case per month: BMI<20 (Patient not taking: Reported on 12/25/2016), Disp: 237 mL, Rfl: 6 .  ranitidine (ZANTAC) 300 MG tablet, TAKE 1 TABLET (300 MG TOTAL) BY MOUTH AT BEDTIME. (Patient not taking: Reported on 12/25/2016), Disp: 30 tablet, Rfl: 3 .  valACYclovir (VALTREX) 1000 MG tablet, TAKE 1 TABLET (1,000 MG TOTAL) BY MOUTH DAILY. (Patient not taking: Reported on 12/25/2016), Disp: 30 tablet, Rfl: 4 No current facility-administered medications for this visit.   Facility-Administered Medications Ordered in Other Visits:  .  0.9 %  sodium chloride infusion, , Intravenous, Once, HCampbell Riches MD   Review of Systems  Constitutional: Negative for activity change, diaphoresis and unexpected weight change.  HENT: Negative for congestion, rhinorrhea, sinus pressure, sneezing, sore throat and trouble swallowing.   Eyes: Negative for photophobia and visual disturbance.  Respiratory: Negative for chest tightness, shortness of breath, wheezing and stridor.   Cardiovascular: Negative for chest pain, palpitations and leg swelling.  Gastrointestinal: Positive for diarrhea, nausea and vomiting. Negative for abdominal distention and anal bleeding.  Genitourinary: Negative for flank pain.  Musculoskeletal: Negative for gait problem and joint swelling.  Skin: Negative for rash and wound.  Neurological: Negative for tremors.  Hematological: Negative for adenopathy. Does not bruise/bleed easily.  Psychiatric/Behavioral: Negative for agitation, confusion, decreased concentration, hallucinations, self-injury and suicidal ideas. The patient is not hyperactive.        Objective:   Physical Exam  Constitutional: He is oriented to person, place, and time. No distress.  HENT:  Head: Normocephalic and atraumatic.  Mouth/Throat: Oropharynx is clear and moist. No oropharyngeal exudate.  Eyes: Conjunctivae  and EOM are normal. No scleral icterus.  Neck: Normal range of motion. Neck supple. No JVD present.  Cardiovascular: Regular rhythm and normal heart sounds.   Pulmonary/Chest: Effort normal. No respiratory distress. He has no wheezes.  Abdominal: Soft. Bowel sounds are normal. He exhibits no distension. There is tenderness in the right upper quadrant. There is no rigidity, no rebound and no guarding.  Musculoskeletal: He exhibits no edema or tenderness.  Lymphadenopathy:    He has no cervical adenopathy.  Neurological: He is alert and oriented to person, place, and time. He displays no atrophy. He exhibits normal muscle tone. Coordination normal.  Skin: Skin is warm and dry. He is not diaphoretic. No erythema. No pallor.  Psychiatric: His behavior is normal. Judgment and thought content normal. He exhibits a depressed mood.          Assessment & Plan:    # 1HIV Disease, hx of AIDS,  Highly Resistant Virus.   He wants to come off of DRV/RTV but we are VERY APPREHENSIVE about this  Genotypically the ONLY NRTI with ANY activity is TNF  He has THREE DRAMS FOR DRV  He has INSTI R with 45 x reduced S to BIC and DTG  We contemplated idea of swapping in [facility] for his DRV but I am not sure Doravirine will be active here (we will research vs his R mutations)  Further more I worry SO MUCH about abandoning PI which I think could be carrying quite a bit of gthe burden here along with DTG  We will also get GENOSURE ARCHIVE   Perhaps TROGARZO offers another option every 2 weeks  Finally we could go to IV FUzeon BID but he hated this drug albeit SubQ in context of his anticoagulation   Finally I think that Fostemsavir an attachment inhibitor from [place] may be closer to being available though I am NOT aware of filing for approval at this point   We could also consider a "ARV holiday"even though we know this is bad for his health, trying to [place].  It  is too bad his wife did not come with him as I suspect MUCH of what we are dealing with is also related to his depression. Certainly his pill burden is large and we could for example cut out the AZT to see if he feels better off of this but stays suppressed    I spent greater than an hour  with the patient including greater than 50% of time in face to face counsel of the patient re his MDR virus, his side effects his desire to be off DRV/RTV  and in counsel with [place], [place] re his resistance mutations and in coordination of his care.

## 2016-12-30 MED FILL — PREZISTA 600 MG TABS: 600 | 30 days supply | Qty: 60 | Fill #0

## 2016-12-30 MED FILL — DESCOVY 200-25 MG TABS: 200-25 | 30 days supply | Qty: 30 | Fill #0

## 2016-12-30 MED FILL — TIVICAY 50 MG TABLET: 50 | 30 days supply | Qty: 60 | Fill #0

## 2016-12-30 MED FILL — ZIDOVUDINE 300 MG TABLET: 300 | 30 days supply | Qty: 60 | Fill #0

## 2016-12-30 MED FILL — RITONAVIR 100 MG TAB: 100 | 30 days supply | Qty: 60 | Fill #0

## 2017-01-16 ENCOUNTER — Other Ambulatory Visit: Payer: Self-pay | Admitting: Pharmacist Clinician (PhC)/ Clinical Pharmacy Specialist

## 2017-01-16 ENCOUNTER — Telehealth: Payer: Self-pay | Admitting: Pharmacist Clinician (PhC)/ Clinical Pharmacy Specialist

## 2017-01-16 NOTE — Telephone Encounter (Signed)
HPI: Brandon [name] is a 51 y.o. male who is here for his routine monthly pill boxes refill.   Allergies: Allergies  Allergen Reactions  . Bactrim [Sulfamethoxazole-Trimethoprim]   . Bee Venom Anaphylaxis  . Sulfa Antibiotics Anaphylaxis  . Truvada [Emtricitabine-Tenofovir Df] Anaphylaxis and Rash    Takes plain tenofovir at home  . Lidoderm [Lidocaine] Other (See Comments)    Reaction unknown  . Raltegravir     resistance  . Ceftriaxone Rash  . Sulfamethoxazole Itching, Other (See Comments) and Rash    Other reaction(s): Hypotension (ALLERGY/intolerance)    Vitals:    Past Medical History: Past Medical History:  Diagnosis Date  . Abdominal pain   . Anemia   . Arthritis   . Back pain 02/13/2016  . Constipation   . Diarrhea   . Foot lesion 12/19/2014  . Gallstones   . Gastric AVM   . GERD (gastroesophageal reflux disease)   . GI bleed   . HIV (human immunodeficiency virus infection) ([place])   . Hypertension   . IBS (irritable bowel syndrome)   . Infectious colitis   . Interstitial cystitis   . Mechanical heart valve present   . Nausea & vomiting   . Pancreatitis   . 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 Stroke ([place])   . Weight loss, unintentional     Social History: Social History   Socioeconomic History  . Marital status: Married    Spouse name: Not on file  . Number of children: 6  . Years of education: Not on file  . Highest education level: Not on file  Social Needs  . Financial resource strain: Not on file  . Food insecurity - worry: Not on file  . Food insecurity - inability: Not on file  . Transportation needs - medical: Not on file  . Transportation needs - non-medical: Not on file  Occupational History  . Occupation: disability rep  Tobacco Use  . Smoking status: Current Every Day Smoker    Packs/day: 0.10    Years: 20.00    Pack years: 2.00    Types: Cigars, Cigarettes  . Smokeless tobacco: Never Used  . Tobacco  comment: cutting back  Substance and Sexual Activity  . Alcohol use: Yes    Alcohol/week: 0.0 oz    Comment: 12oz beer/ per week   . Drug use: Yes    Frequency: 7.0 times per week    Types: Marijuana    Comment: daily 2 joints   . Sexual activity: Yes    Partners: Female    Birth control/protection: Condom  Other Topics Concern  . Not on file  Social History Narrative  . Not on file    Previous Regimen: Tivicay BID Prezista BID Norvir BID Combivir BID AZT BID  Current Regimen: DTG BID Prezista BID Norvir BID AZT BID Descovy QD  Labs: HIV 1 RNA Quant (copies/mL)  Date Value  12/09/2016 <20 DETECTED (A)  07/25/2016 37 (H)  08/14/2015 1,124 (H)   CD4 T Cell Abs (/uL)  Date Value  12/09/2016 530  07/25/2016 410  03/26/2016 520   Hep B S Ab (no units)  Date Value  05/14/2012 NONREACTIVE   Hepatitis B Surface Ag (no units)  Date Value  05/14/2012 NEGATIVE   HCV Ab (no units)  Date Value  05/14/2012 NEGATIVE    CrCl: CrCl cannot be calculated (Patient's most recent lab result is older than the maximum 21 days allowed.).  Lipids:    Component Value  Date/Time   CHOL 125 12/09/2016 1521   TRIG 165 (H) 12/09/2016 1521   HDL 35 (L) 12/09/2016 1521   CHOLHDL 3.6 12/09/2016 1521   VLDL 17 07/18/2014 1601   LDLCALC 55 07/18/2014 1601    Assessment: [name] is here for his routine monthly refill of his pill boxes. He has been doing well on it. He would like to simplify the regimen but he has extensive resistance mutations. We tried to get a [employer] but ran into some issue with Monogram. Therefore, we are going to repeat it today. I discussed it extensively with him today that it may be best to wait until some new therapies are out there like [id number] or Fostemsavir. It's probably best to keep the same regimen for now.   Recommendations:  Continue Prezista 600mg  PO BID Continue ritonavir 100mg  PO BID Continue DTG 50mg  PO BID Continue AZT 300mg   PO BID Continue Descovy 1 PO qday F/u next month for the month Re-ordered [employer], PharmD, BCPS, AAHIVP, [place] for Infectious Disease 01/16/2017, 3:36 PM

## 2017-01-27 MED FILL — DESCOVY 200-25 MG TABS: 200-25 | 30 days supply | Qty: 30 | Fill #1

## 2017-01-27 MED FILL — RITONAVIR 100 MG TABS: 100 | 30 days supply | Qty: 60 | Fill #1

## 2017-01-27 MED FILL — ZIDOVUDINE 300 MG TABLET: 300 | 30 days supply | Qty: 60 | Fill #1

## 2017-01-27 MED FILL — PREZISTA 600 MG TABS: 600 | 30 days supply | Qty: 60 | Fill #1

## 2017-01-27 MED FILL — TIVICAY 50 MG TABLET: 50 | 30 days supply | Qty: 60 | Fill #1

## 2017-02-13 ENCOUNTER — Ambulatory Visit (INDEPENDENT_AMBULATORY_CARE_PROVIDER_SITE_OTHER): Payer: Medicaid Other | Admitting: Pharmacist Clinician (PhC)/ Clinical Pharmacy Specialist

## 2017-02-13 ENCOUNTER — Telehealth: Payer: Self-pay | Admitting: Pharmacist Clinician (PhC)/ Clinical Pharmacy Specialist

## 2017-02-13 DIAGNOSIS — B20 Human immunodeficiency virus [HIV] disease: Secondary | ICD-10-CM

## 2017-02-13 NOTE — Progress Notes (Signed)
Pill boxes refilled.

## 2017-02-13 NOTE — Telephone Encounter (Signed)
DTG BID Prezista BID Norvir BID AZT BID Descovy QD

## 2017-02-25 MED FILL — DESCOVY 200-25 MG TABS: 200-25 | 30 days supply | Qty: 30 | Fill #2

## 2017-02-25 MED FILL — RITONAVIR 100 MG TABS: 100 | 30 days supply | Qty: 60 | Fill #2

## 2017-02-25 MED FILL — TIVICAY 50 MG TABLET: 50 | 30 days supply | Qty: 60 | Fill #2

## 2017-02-25 MED FILL — ZIDOVUDINE 300 MG TABLET: 300 | 30 days supply | Qty: 60 | Fill #2

## 2017-02-25 MED FILL — PREZISTA 600 MG TABS: 600 | 30 days supply | Qty: 60 | Fill #2

## 2017-03-20 ENCOUNTER — Ambulatory Visit (INDEPENDENT_AMBULATORY_CARE_PROVIDER_SITE_OTHER): Payer: Medicaid Other | Admitting: Pharmacist

## 2017-03-20 DIAGNOSIS — B20 Human immunodeficiency virus [HIV] disease: Secondary | ICD-10-CM | POA: Diagnosis present

## 2017-03-20 NOTE — Progress Notes (Signed)
[facility] Pharmacy Visit  HPI: Brandon [name] is a 52 y.o. male who presents to the [facility] clinic today so I can refill his pill box.  Patient Active Problem List   Diagnosis Date Noted  . Back pain 02/13/2016  . Foot lesion 12/19/2014  . 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 Thumb pain 03/31/2014  . Left hand weakness 03/31/2014  . Opiate dependence ([place]) 03/31/2014  . Major depression, recurrent, chronic ([place]) 03/31/2014  . Cyclic vomiting syndrome 03/31/2014  . Alleged drug diversion 01/31/2014  . Protein-calorie malnutrition, severe ([place]) 08/24/2013  . Diarrhea 08/23/2013  . HIV positive ([place]) 08/23/2013  . Gastroenteritis 08/05/2013  . Depression 03/17/2013  . Sinus congestion 03/17/2013  . Odynophagia 07/24/2012  . Persistent vomiting 07/24/2012  . Candida esophagitis ([place]) 07/23/2012  . Intractable nausea and vomiting 07/23/2012  . Adjustment disorder with mixed anxiety and depressed mood 05/14/2012  . HTN (hypertension) 05/14/2012  . Genital herpes 05/14/2012  . Long term (current) use of anticoagulants 05/14/2012  . cerebral artey occ 05/14/2012  . Unspecified cerebral artery occlusion with cerebral infarction 05/14/2012  . Chronic pancreatitis ([place]) 02/20/2012  . Hypokalemia 02/20/2012  . Acute pancreatitis 02/09/2012  . Nausea & vomiting 02/09/2012  . Abdominal pain 02/09/2012  . Anemia 02/09/2012  . S/P AVR (aortic valve replacement) 02/09/2012  . Septic arthritis of knee ([place]) 02/09/2012  . Warfarin-induced coagulopathy ([place]) 02/09/2012  . AIDS ([place]) 02/09/2012    Patient's Medications  New Prescriptions   No medications on file  Previous Medications   DARUNAVIR (PREZISTA) 600 MG TABLET    Take 1 tablet (600 mg total) by mouth 2 (two) times daily with a meal.   DOLUTEGRAVIR (TIVICAY) 50 MG TABLET    Take 1 tablet (50 mg total) by mouth 2 (two) times daily.   EMTRICITABINE-TENOFOVIR AF (DESCOVY)  200-25 MG TABLET    Take 1 tablet by mouth daily.   ENSURE (ENSURE)    Take 237 mLs by mouth 2 (two) times daily between meals. Provide a case per month: BMI<20   EPINEPHRINE (EPI-PEN) 0.3 MG/0.3 ML DEVI    Inject 0.3 mLs (0.3 mg total) into the muscle once.   ESCITALOPRAM (LEXAPRO) 20 MG TABLET    TAKE 1 TABLET BY MOUTH   DAILY   ONDANSETRON (ZOFRAN ODT) 8 MG DISINTEGRATING TABLET    Take 1 tablet (8 mg total) by mouth every 8 (eight) hours as needed for nausea or vomiting.   OPIUM 10 MG/ML (1%) TINC    Take 0.6 mLs (6 mg total) by mouth every 6 (six) hours.   PROCHLORPERAZINE (COMPAZINE) 10 MG TABLET    TAKE 1 TABLET BY MOUTH EVERY 6 HOURS AS NEEDED FOR NAUSEA   RANITIDINE (ZANTAC) 300 MG TABLET    TAKE 1 TABLET (300 MG TOTAL) BY MOUTH AT BEDTIME.   RITONAVIR (NORVIR) 100 MG TABS TABLET    Take 1 tablet (100 mg total) by mouth 2 (two) times daily.   VALACYCLOVIR (VALTREX) 1000 MG TABLET    TAKE 1 TABLET (1,000 MG TOTAL) BY MOUTH DAILY.   WARFARIN (COUMADIN) 1 MG TABLET    Take as directed with 5mg  tablet per Coumadin Clinic. Take 5 mg once daily on Monday and Friday. Take 6 mg by mouth once daily on Tuesday, Wednesday, Thursday, Saturday, and Sunday.   ZIDOVUDINE (RETROVIR) 300 MG TABLET    Take 1 tablet (300 mg total) by mouth 2 (two) times daily.  Modified Medications  No medications on file  Discontinued Medications   No medications on file    Allergies: Allergies  Allergen Reactions  . Bactrim [Sulfamethoxazole-Trimethoprim]   . Bee Venom Anaphylaxis  . Sulfa Antibiotics Anaphylaxis  . Truvada [Emtricitabine-Tenofovir Df] Anaphylaxis and Rash    Takes plain tenofovir at home  . Lidoderm [Lidocaine] Other (See Comments)    Reaction unknown  . Raltegravir     resistance  . Ceftriaxone Rash  . Sulfamethoxazole Itching, Other (See Comments) and Rash    Other reaction(s): Hypotension (ALLERGY/intolerance)    Past Medical History: Past Medical History:  Diagnosis Date  .  Abdominal pain   . Anemia   . Arthritis   . Back pain 02/13/2016  . Constipation   . Diarrhea   . Foot lesion 12/19/2014  . Gallstones   . Gastric AVM   . GERD (gastroesophageal reflux disease)   . GI bleed   . HIV (human immunodeficiency virus infection) ([place])   . Hypertension   . IBS (irritable bowel syndrome)   . Infectious colitis   . Interstitial cystitis   . Mechanical heart valve present   . Nausea & vomiting   . Pancreatitis   . 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 Stroke ([place])   . Weight loss, unintentional     Social History: Social History   Socioeconomic History  . Marital status: Married    Spouse name: Not on file  . Number of children: 6  . Years of education: Not on file  . Highest education level: Not on file  Social Needs  . Financial resource strain: Not on file  . Food insecurity - worry: Not on file  . Food insecurity - inability: Not on file  . Transportation needs - medical: Not on file  . Transportation needs - non-medical: Not on file  Occupational History  . Occupation: disability rep  Tobacco Use  . Smoking status: Current Every Day Smoker    Packs/day: 0.10    Years: 20.00    Pack years: 2.00    Types: Cigars, Cigarettes  . Smokeless tobacco: Never Used  . Tobacco comment: cutting back  Substance and Sexual Activity  . Alcohol use: Yes    Alcohol/week: 0.0 oz    Comment: 12oz beer/ per week   . Drug use: Yes    Frequency: 7.0 times per week    Types: Marijuana    Comment: daily 2 joints   . Sexual activity: Yes    Partners: Female    Birth control/protection: Condom  Other Topics Concern  . Not on file  Social History Narrative  . Not on file    Labs: HIV 1 RNA Quant (copies/mL)  Date Value  12/09/2016 <20 DETECTED (A)  07/25/2016 37 (H)  08/14/2015 1,124 (H)   CD4 T Cell Abs (/uL)  Date Value  12/09/2016 530  07/25/2016 410  03/26/2016 520   Hep B S Ab (no units)  Date Value  05/14/2012  NONREACTIVE   Hepatitis B Surface Ag (no units)  Date Value  05/14/2012 NEGATIVE   HCV Ab (no units)  Date Value  05/14/2012 NEGATIVE    Lipids:    Component Value Date/Time   CHOL 125 12/09/2016 1521   TRIG 165 (H) 12/09/2016 1521   HDL 35 (L) 12/09/2016 1521   CHOLHDL 3.6 12/09/2016 1521   VLDL 17 07/18/2014 1601   LDLCALC 55 07/18/2014 1601    Current HIV Regimen: Prezista, Norvir, Tivicay, Descovy, Zidovudine  Assessment: Kris Mouton  dropped off his pill boxes so I could refill them for him.  No missed doses and no issues.   Plan: - Refill pill box again next month  Cassie L. Kuppelweiser, PharmD, [place], [facility] 03/20/2017, 3:24 PM

## 2017-03-28 MED FILL — PREZISTA 600 MG TABS: 600 | 30 days supply | Qty: 60 | Fill #3

## 2017-03-28 MED FILL — RITONAVIR 100 MG TABS: 100 | 30 days supply | Qty: 60 | Fill #3

## 2017-03-28 MED FILL — TIVICAY 50 MG TABLET: 50 | 30 days supply | Qty: 60 | Fill #3

## 2017-03-28 MED FILL — DESCOVY 200-25 MG TABS: 200-25 | 30 days supply | Qty: 30 | Fill #3

## 2017-03-28 MED FILL — ZIDOVUDINE 300 MG TABLET: 300 | 30 days supply | Qty: 60 | Fill #3

## 2017-04-22 ENCOUNTER — Telehealth: Payer: Self-pay | Admitting: Pharmacist Clinician (PhC)/ Clinical Pharmacy Specialist

## 2017-04-22 NOTE — Telephone Encounter (Signed)
Brandon [name] called to let us know that he'll come in on Thursday to get his routine pill boxes refill. He just wants to make sure that we will have meds here for it.

## 2017-04-23 MED FILL — DESCOVY 200-25 MG TABS: 200-25 | 30 days supply | Qty: 30 | Fill #4

## 2017-04-23 MED FILL — TIVICAY 50 MG TABLET: 50 | 30 days supply | Qty: 60 | Fill #4

## 2017-04-23 MED FILL — PREZISTA 600 MG TABS: 600 | 30 days supply | Qty: 60 | Fill #4

## 2017-04-23 MED FILL — RITONAVIR 100 MG TABS: 100 | 30 days supply | Qty: 60 | Fill #4

## 2017-04-23 MED FILL — ZIDOVUDINE 300 MG TABLET: 300 | 30 days supply | Qty: 60 | Fill #4

## 2017-04-24 ENCOUNTER — Telehealth: Payer: Self-pay | Admitting: Pharmacist Clinician (PhC)/ Clinical Pharmacy Specialist

## 2017-04-24 NOTE — Telephone Encounter (Signed)
Pill boxes refill  DTG BID Prezista BID Norvir BID AZT BID Descovy QD  

## 2017-05-20 ENCOUNTER — Other Ambulatory Visit: Payer: Self-pay | Admitting: Infectious Disease

## 2017-05-20 DIAGNOSIS — B20 Human immunodeficiency virus [HIV] disease: Secondary | ICD-10-CM

## 2017-05-20 MED FILL — TIVICAY 50 MG TABLET: 50 | 30 days supply | Qty: 60 | Fill #5

## 2017-05-20 MED FILL — ZIDOVUDINE 300 MG TABLET: 300 | 30 days supply | Qty: 60 | Fill #5

## 2017-05-20 MED FILL — PREZISTA 600 MG TABS: 600 | 30 days supply | Qty: 60 | Fill #5

## 2017-05-20 MED FILL — DESCOVY 200-25 MG TABS: 200-25 | 30 days supply | Qty: 30 | Fill #5

## 2017-05-20 MED FILL — RITONAVIR 100 MG TABS: 100 | 30 days supply | Qty: 60 | Fill #5

## 2017-05-30 ENCOUNTER — Telehealth: Payer: Self-pay | Admitting: Pharmacist Clinician (PhC)/ Clinical Pharmacy Specialist

## 2017-05-30 NOTE — Telephone Encounter (Signed)
Monthly pill boxes refill.  DTG BID Prezista BID Norvir BID AZT BID Descovy QD

## 2017-06-16 MED FILL — RITONAVIR 100 MG TABS: 100 | 30 days supply | Qty: 60 | Fill #0

## 2017-06-16 MED FILL — DESCOVY 200-25 MG TABS: 200-25 | 30 days supply | Qty: 30 | Fill #0

## 2017-06-16 MED FILL — ZIDOVUDINE 300 MG TABLET: 300 | 30 days supply | Qty: 60 | Fill #0

## 2017-06-16 MED FILL — PREZISTA 600 MG TABS: 600 | 30 days supply | Qty: 60 | Fill #0

## 2017-06-16 MED FILL — TIVICAY 50 MG TABLET: 50 | 30 days supply | Qty: 60 | Fill #0

## 2017-06-26 ENCOUNTER — Telehealth: Payer: Self-pay | Admitting: Pharmacist Clinician (PhC)/ Clinical Pharmacy Specialist

## 2017-06-26 NOTE — Telephone Encounter (Signed)
Monthly pill boxes refill.  DTG BID Prezista BID Norvir BID AZT BID Descovy QD

## 2017-07-15 MED FILL — RITONAVIR 100 MG TABS: 100 | 30 days supply | Qty: 60 | Fill #1

## 2017-07-15 MED FILL — ZIDOVUDINE 300 MG TABLET: 300 | 30 days supply | Qty: 60 | Fill #1

## 2017-07-15 MED FILL — TIVICAY 50 MG TABLET: 50 | 30 days supply | Qty: 60 | Fill #1

## 2017-07-15 MED FILL — PREZISTA 600 MG TABS: 600 | 30 days supply | Qty: 60 | Fill #1

## 2017-07-15 MED FILL — DESCOVY 200-25 MG TABS: 200-25 | 30 days supply | Qty: 30 | Fill #1

## 2017-07-24 ENCOUNTER — Telehealth: Payer: Self-pay | Admitting: Pharmacist Clinician (PhC)/ Clinical Pharmacy Specialist

## 2017-07-24 NOTE — Telephone Encounter (Signed)
Pill boxes refill  DTG BID Prezista BID Norvir BID AZT BID Descovy QD  

## 2017-08-13 MED FILL — TIVICAY 50 MG TABLET: 50 | 30 days supply | Qty: 60 | Fill #2

## 2017-08-13 MED FILL — ZIDOVUDINE 300 MG TABLET: 300 | 30 days supply | Qty: 60 | Fill #2

## 2017-08-13 MED FILL — PREZISTA 600 MG TABS: 600 | 30 days supply | Qty: 60 | Fill #2

## 2017-08-13 MED FILL — RITONAVIR 100 MG TABS: 100 | 30 days supply | Qty: 60 | Fill #2

## 2017-08-13 MED FILL — DESCOVY 200-25 MG TABS: 200-25 | 30 days supply | Qty: 30 | Fill #2

## 2017-08-21 ENCOUNTER — Telehealth: Payer: Self-pay | Admitting: Pharmacist Clinician (PhC)/ Clinical Pharmacy Specialist

## 2017-08-21 NOTE — Telephone Encounter (Signed)
Pill boxes refill  DTG BID Prezista BID Norvir BID AZT BID Descovy QD  

## 2017-09-10 MED FILL — ZIDOVUDINE 300 MG TABLET: 300 | 30 days supply | Qty: 60 | Fill #3

## 2017-09-10 MED FILL — TIVICAY 50 MG TABLET: 50 | 30 days supply | Qty: 60 | Fill #3

## 2017-09-10 MED FILL — RITONAVIR 100 MG TABS: 100 | 30 days supply | Qty: 60 | Fill #3

## 2017-09-10 MED FILL — DESCOVY 200-25 MG TABS: 200-25 | 30 days supply | Qty: 30 | Fill #3

## 2017-09-10 MED FILL — PREZISTA 600 MG TABS: 600 | 30 days supply | Qty: 60 | Fill #3

## 2017-09-18 ENCOUNTER — Telehealth: Payer: Self-pay | Admitting: Pharmacist Clinician (PhC)/ Clinical Pharmacy Specialist

## 2017-09-18 NOTE — Telephone Encounter (Signed)
Pill boxes refill  DTG BID Prezista BID Norvir BID AZT BID Descovy QD

## 2017-10-13 MED FILL — PREZISTA 600 MG TABS: 600 | 30 days supply | Qty: 60 | Fill #4

## 2017-10-13 MED FILL — DESCOVY 200-25 MG TABS: 200-25 | 30 days supply | Qty: 30 | Fill #4

## 2017-10-13 MED FILL — TIVICAY 50 MG TABLET: 50 | 30 days supply | Qty: 60 | Fill #4

## 2017-10-13 MED FILL — RITONAVIR 100 MG TABS: 100 | 30 days supply | Qty: 60 | Fill #4

## 2017-10-13 MED FILL — ZIDOVUDINE 300 MG TABLET: 300 | 30 days supply | Qty: 60 | Fill #4

## 2017-10-16 ENCOUNTER — Other Ambulatory Visit: Payer: Self-pay | Admitting: Pharmacist

## 2017-10-16 NOTE — Progress Notes (Signed)
Filled patient's pill boxes for him.  Asked the front desk to make an appt to see Dr. Tommy Medal or a NP when he comes in to pick up his boxes. He hasn't seen Dr. Tommy Medal since October 2018.

## 2017-10-17 ENCOUNTER — Ambulatory Visit: Payer: Medicaid Other | Admitting: Family

## 2017-11-20 ENCOUNTER — Ambulatory Visit (INDEPENDENT_AMBULATORY_CARE_PROVIDER_SITE_OTHER): Payer: Medicaid Other | Admitting: Pharmacist

## 2017-11-20 DIAGNOSIS — B20 Human immunodeficiency virus [HIV] disease: Secondary | ICD-10-CM | POA: Diagnosis present

## 2017-11-20 MED ORDER — EMTRICITABINE-TENOFOVIR AF 200-25 MG PO TABS: 1.0000 | ORAL_TABLET | Freq: Every day | ORAL | 5 refills | Status: DC

## 2017-11-20 MED ORDER — RITONAVIR 100 MG PO TABS: 100.0000 mg | ORAL_TABLET | Freq: Two times a day (BID) | ORAL | 5 refills | Status: DC

## 2017-11-20 MED ORDER — DOLUTEGRAVIR SODIUM 50 MG PO TABS: 50.0000 mg | ORAL_TABLET | Freq: Two times a day (BID) | ORAL | 5 refills | Status: DC

## 2017-11-20 MED ORDER — ZIDOVUDINE 300 MG PO TABS: 300.0000 mg | ORAL_TABLET | Freq: Two times a day (BID) | ORAL | 5 refills | Status: DC

## 2017-11-20 MED ORDER — DARUNAVIR ETHANOLATE 600 MG PO TABS: 600.0000 mg | ORAL_TABLET | Freq: Two times a day (BID) | ORAL | 5 refills | Status: DC

## 2017-11-20 NOTE — Progress Notes (Signed)
Filled patient's pill box. He will f/u with Dr. Tommy Medal on 9/20.

## 2017-11-28 ENCOUNTER — Telehealth: Payer: Self-pay

## 2017-11-28 ENCOUNTER — Ambulatory Visit: Payer: Medicaid Other | Admitting: Family

## 2017-11-28 ENCOUNTER — Encounter: Payer: Self-pay | Admitting: Family

## 2017-11-28 VITALS — BP 122/79 | HR 65 | Temp 98.2°F | Wt 124.8 lb

## 2017-11-28 DIAGNOSIS — B20 Human immunodeficiency virus [HIV] disease: Secondary | ICD-10-CM | POA: Diagnosis present

## 2017-11-28 DIAGNOSIS — Z952 Presence of prosthetic heart valve: Secondary | ICD-10-CM

## 2017-11-28 DIAGNOSIS — Z23 Encounter for immunization: Secondary | ICD-10-CM | POA: Diagnosis not present

## 2017-11-28 LAB — T-HELPER CELL (CD4) - (RCID CLINIC ONLY)
CD4 % Helper T Cell: 16 % — ABNORMAL LOW (ref 33–55)
CD4 T Cell Abs: 290 /uL — ABNORMAL LOW (ref 400–2700)

## 2017-11-28 NOTE — Telephone Encounter (Signed)
4:50 INR not resulted.   Laverle Patter, RN

## 2017-11-28 NOTE — Progress Notes (Signed)
Subjective:    Patient ID: Brandon [name], male    DOB: Feb 02, 1966, 52 y.o.   MRN: [id number]  Chief Complaint  Patient presents with  . HIV Positive/AIDS     HPI:  Brandon [name] is a 52 y.o. male who presents today for routine follow up of his HIV disease.  Brandon [name] was last seen in the office on on 12/25/16 and placed on an ART regimen of Darunivir, dolutegravir, Descovy, Norvir, and Zidovudine and was noted to be undetectable with a CD4 count of 530. He has not completed lab work since 12/09/16. Due for Prevnar, influenza, and Pneumovax.  Brandon [name] continues to take his darunavir, dolutegravir, Descovy, Norvir, and Zidovudine and as prescribed with no missed doses.  He continues to work on disability services and received coverage through [place].  He has no problems obtaining or taking his medications as he has them filled here in our clinic.  He does have the occasional stomach issue with the multiple drug regimen.  Denies fevers, chills, night sweats, headaches, changes in vision, neck pain/stiffness, nausea, diarrhea, vomiting, lesions or rashes.   Allergies  Allergen Reactions  . Bactrim [Sulfamethoxazole-Trimethoprim]   . Bee Venom Anaphylaxis  . Sulfa Antibiotics Anaphylaxis  . Truvada [Emtricitabine-Tenofovir Df] Anaphylaxis and Rash    Takes plain tenofovir at home  . Lidoderm [Lidocaine] Other (See Comments)    Reaction unknown  . Raltegravir     resistance  . Ceftriaxone Rash  . Sulfamethoxazole Itching, Other (See Comments) and Rash    Other reaction(s): Hypotension (ALLERGY/intolerance)      Outpatient Medications Prior to Visit  Medication Sig Dispense Refill  . darunavir (PREZISTA) 600 MG tablet Take 1 tablet (600 mg total) by mouth 2 (two) times daily with a meal. 60 tablet 5  . dolutegravir (TIVICAY) 50 MG tablet Take 1 tablet (50 mg total) by mouth 2 (two) times daily. 60 tablet 5  . emtricitabine-tenofovir AF (DESCOVY) 200-25 MG tablet Take  1 tablet by mouth daily. 30 tablet 5  . ENSURE (ENSURE) Take 237 mLs by mouth 2 (two) times daily between meals. Provide a case per month: BMI<20 237 mL 6  . EPINEPHrine (EPI-PEN) 0.3 mg/0.3 mL DEVI Inject 0.3 mLs (0.3 mg total) into the muscle once. 1 Device 1  . escitalopram (LEXAPRO) 20 MG tablet TAKE 1 TABLET BY MOUTH   DAILY 30 tablet 1  . ondansetron (ZOFRAN ODT) 8 MG disintegrating tablet Take 1 tablet (8 mg total) by mouth every 8 (eight) hours as needed for nausea or vomiting. 30 tablet 0  . Opium 10 MG/ML (1%) TINC Take 0.6 mLs (6 mg total) by mouth every 6 (six) hours. 72 mL 0  . prochlorperazine (COMPAZINE) 10 MG tablet TAKE 1 TABLET BY MOUTH EVERY 6 HOURS AS NEEDED FOR NAUSEA 120 tablet 0  . ranitidine (ZANTAC) 300 MG tablet TAKE 1 TABLET (300 MG TOTAL) BY MOUTH AT BEDTIME. 30 tablet 3  . ritonavir (NORVIR) 100 MG TABS tablet Take 1 tablet (100 mg total) by mouth 2 (two) times daily with a meal. 60 tablet 5  . valACYclovir (VALTREX) 1000 MG tablet TAKE 1 TABLET (1,000 MG TOTAL) BY MOUTH DAILY. 30 tablet 4  . warfarin (COUMADIN) 1 MG tablet Take as directed with 5mg  tablet per Coumadin Clinic. Take 5 mg once daily on Monday and Friday. Take 6 mg by mouth once daily on Tuesday, Wednesday, Thursday, Saturday, and Sunday.    . zidovudine (RETROVIR) 300 MG tablet Take  1 tablet (300 mg total) by mouth 2 (two) times daily. 60 tablet 5   Facility-Administered Medications Prior to Visit  Medication Dose Route Frequency Provider Last Rate Last Dose  . 0.9 %  sodium chloride infusion   Intravenous Once Campbell Riches, MD         Past Medical History:  Diagnosis Date  . Abdominal pain   . Anemia   . Arthritis   . Back pain 02/13/2016  . Constipation   . Diarrhea   . Foot lesion 12/19/2014  . Gallstones   . Gastric AVM   . GERD (gastroesophageal reflux disease)   . GI bleed   . HIV (human immunodeficiency virus infection) ([place])   . Hypertension   . IBS (irritable bowel syndrome)    . Infectious colitis   . Interstitial cystitis   . Mechanical heart valve present   . Nausea & vomiting   . Pancreatitis   . 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 Stroke ([place])   . Weight loss, unintentional      Past Surgical History:  Procedure Laterality Date  . AORTIC VALVE REPLACEMENT    . CARDIAC SURGERY    . CHOLECYSTECTOMY  02/12/2012   Procedure: LAPAROSCOPIC CHOLECYSTECTOMY;  Surgeon: Stark Klein, MD;  Location: [facility];  Service: General;  Laterality: N/A;  . ESOPHAGOGASTRODUODENOSCOPY N/A 07/24/2012   Procedure: ESOPHAGOGASTRODUODENOSCOPY (EGD);  Surgeon: Beryle Beams, MD;  Location: [facility] ENDOSCOPY;  Service: Endoscopy;  Laterality: N/A;  . KNEE SURGERY         Review of Systems  Constitutional: Negative for appetite change, chills, fatigue, fever and unexpected weight change.  Eyes: Negative for visual disturbance.  Respiratory: Negative for cough, chest tightness, shortness of breath and wheezing.   Cardiovascular: Negative for chest pain and leg swelling.  Gastrointestinal: Negative for abdominal pain, constipation, diarrhea, nausea and vomiting.  Genitourinary: Negative for dysuria, flank pain, frequency, genital sores, hematuria and urgency.  Skin: Negative for rash.  Allergic/Immunologic: Negative for immunocompromised state.  Neurological: Negative for dizziness and headaches.      Objective:    BP 122/79   Pulse 65   Temp 98.2 F (36.8 C)   Wt 124 lb 12.8 oz (56.6 kg)   BMI 18.98 kg/m  Nursing note and vital signs reviewed.  Physical Exam  Constitutional: He is oriented to person, place, and time. He appears well-developed. No distress.  HENT:  Mouth/Throat: Oropharynx is clear and moist.  Eyes: Conjunctivae are normal.  Neck: Neck supple.  Cardiovascular: Normal rate, regular rhythm, normal heart sounds and intact distal pulses. Exam reveals no gallop and no friction rub.  No murmur heard. Mechanical click noted    Pulmonary/Chest: Effort normal and breath sounds normal. No respiratory distress. He has no wheezes. He has no rales. He exhibits no tenderness.  Abdominal: Soft. Bowel sounds are normal. There is no tenderness.  Lymphadenopathy:    He has no cervical adenopathy.  Neurological: He is alert and oriented to person, place, and time.  Skin: Skin is warm and dry. No rash noted.  Psychiatric: He has a normal mood and affect. His behavior is normal. Judgment and thought content normal.       Assessment & Plan:   Problem List Items Addressed This Visit      Other   S/P AVR (aortic valve replacement) (Chronic)    Patient requesting INR check for therapeutic drug monitoring. INR order placed.       Relevant Orders  Protime-INR   HIV disease [facility]) - Primary    Mr. Ledwith has adequately controlled HIV disease based on previous blood work with no new recent blood work for evaluation.  He appears adherent to his medication regimen with occasional gastrointestinal side effect.  He has no signs/symptoms of opportunistic infection or progressive HIV disease through history or physical exam.  Pneumovax and influenza updated today.  Continue current salvage regimen of darunavir, Norvir, Tivicay, Descovy, Zidivudine. Pharmacy will check his copay status. He will be placed on the list to see the dentist per his request. Follow up office visit in 6 months or sooner if needed with blood work 1-2 weeks prior to appointment.       Relevant Orders   T-helper cell (CD4)- ([facility] clinic only)   CBC   Comprehensive metabolic panel   RPR   Lipid panel   HIV-1 RNA ultraquant reflex to gentyp+   Pneumococcal polysaccharide vaccine 23-valent greater than or equal to 2yo subcutaneous/IM (Completed)    Other Visit Diagnoses    Need for pneumococcal vaccination       Relevant Orders   Pneumococcal polysaccharide vaccine 23-valent greater than or equal to 2yo subcutaneous/IM (Completed)   Need for immunization  against influenza       Relevant Orders   Flu Vaccine QUAD 36+ mos IM (Completed)       I am having Kavon L. Sokolow maintain his EPINEPHrine, ENSURE, warfarin, ondansetron, prochlorperazine, ranitidine, valACYclovir, escitalopram, Opium, emtricitabine-tenofovir AF, darunavir, ritonavir, dolutegravir, and zidovudine.   No orders of the defined types were placed in this encounter.    Follow-up: Return in about 6 months (around 05/29/2018), or if symptoms worsen or fail to improve.   Terri Piedra, MSN, FNP-C Nurse Practitioner [facility] for Infectious Disease [place] [facility] Office phone: [phone] Pager: [place] number: [phone]

## 2017-11-28 NOTE — Assessment & Plan Note (Signed)
Brandon [name] has adequately controlled HIV disease based on previous blood work with no new recent blood work for evaluation.  He appears adherent to his medication regimen with occasional gastrointestinal side effect.  He has no signs/symptoms of opportunistic infection or progressive HIV disease through history or physical exam.  Pneumovax and influenza updated today.  Continue current salvage regimen of darunavir, Norvir, Tivicay, Descovy, Zidivudine. Pharmacy will check his copay status. He will be placed on the list to see the dentist per his request. Follow up office visit in 6 months or sooner if needed with blood work 1-2 weeks prior to appointment.

## 2017-11-28 NOTE — Assessment & Plan Note (Signed)
Patient requesting INR check for therapeutic drug monitoring. INR order placed.

## 2017-11-28 NOTE — Patient Instructions (Signed)
Nice to meet you.  Continue take your medications as prescribed.  Our pharmacy staff will check on the co-pay status.  Plan for office follow-up in 6 months or sooner with blood work 1 to 2 weeks prior to appointment.  We will get you on the dental list as well.

## 2017-11-28 NOTE — Telephone Encounter (Signed)
Pharmacist calling for INR resutls from this morning.  The test has not resulted as of 3:15 pm They are requesting faxed labs upon completion.   Fax to [phone] Phone [phone]   Venda Rodes, RN

## 2017-12-01 NOTE — Telephone Encounter (Signed)
Labs faxed to [facility] at [phone]

## 2017-12-02 IMAGING — CR DG ABDOMEN ACUTE W/ 1V CHEST
3 series · 3 of 3 positions shown · non-contrast
Comparison: 08/24/2013.

CLINICAL DATA: Diffuse abdominal pain, nausea and vomiting since
yesterday.

EXAM:
DG ABDOMEN ACUTE W/ 1V CHEST

[w chest pa]
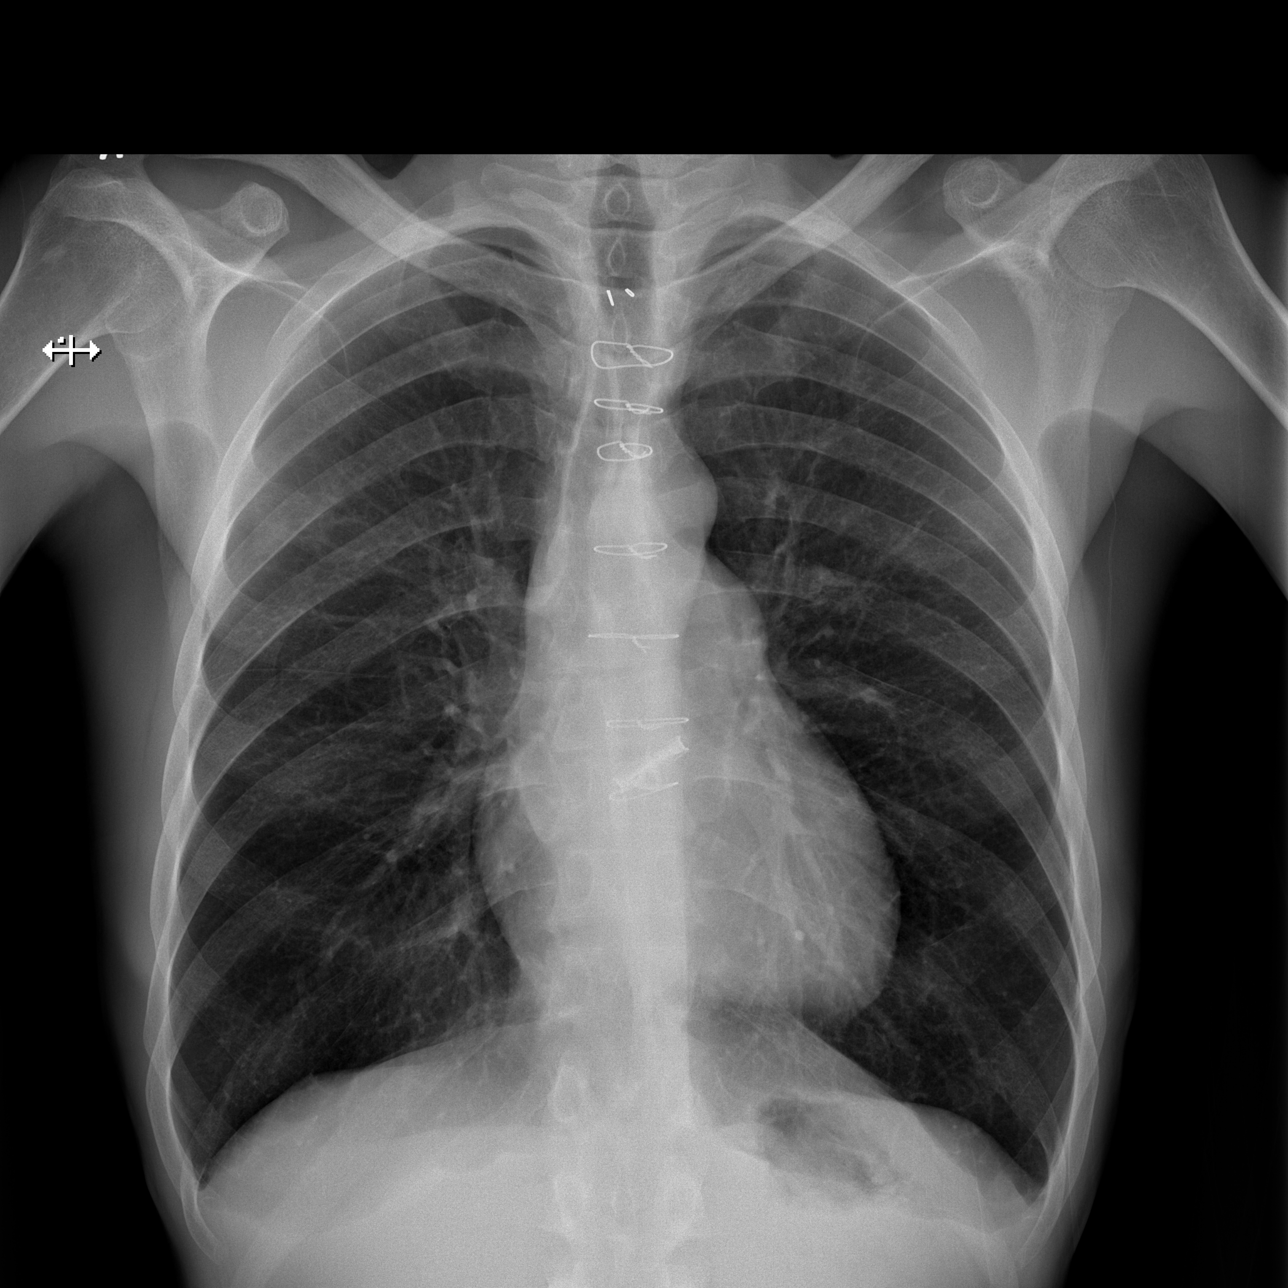

[w abdomen upright]
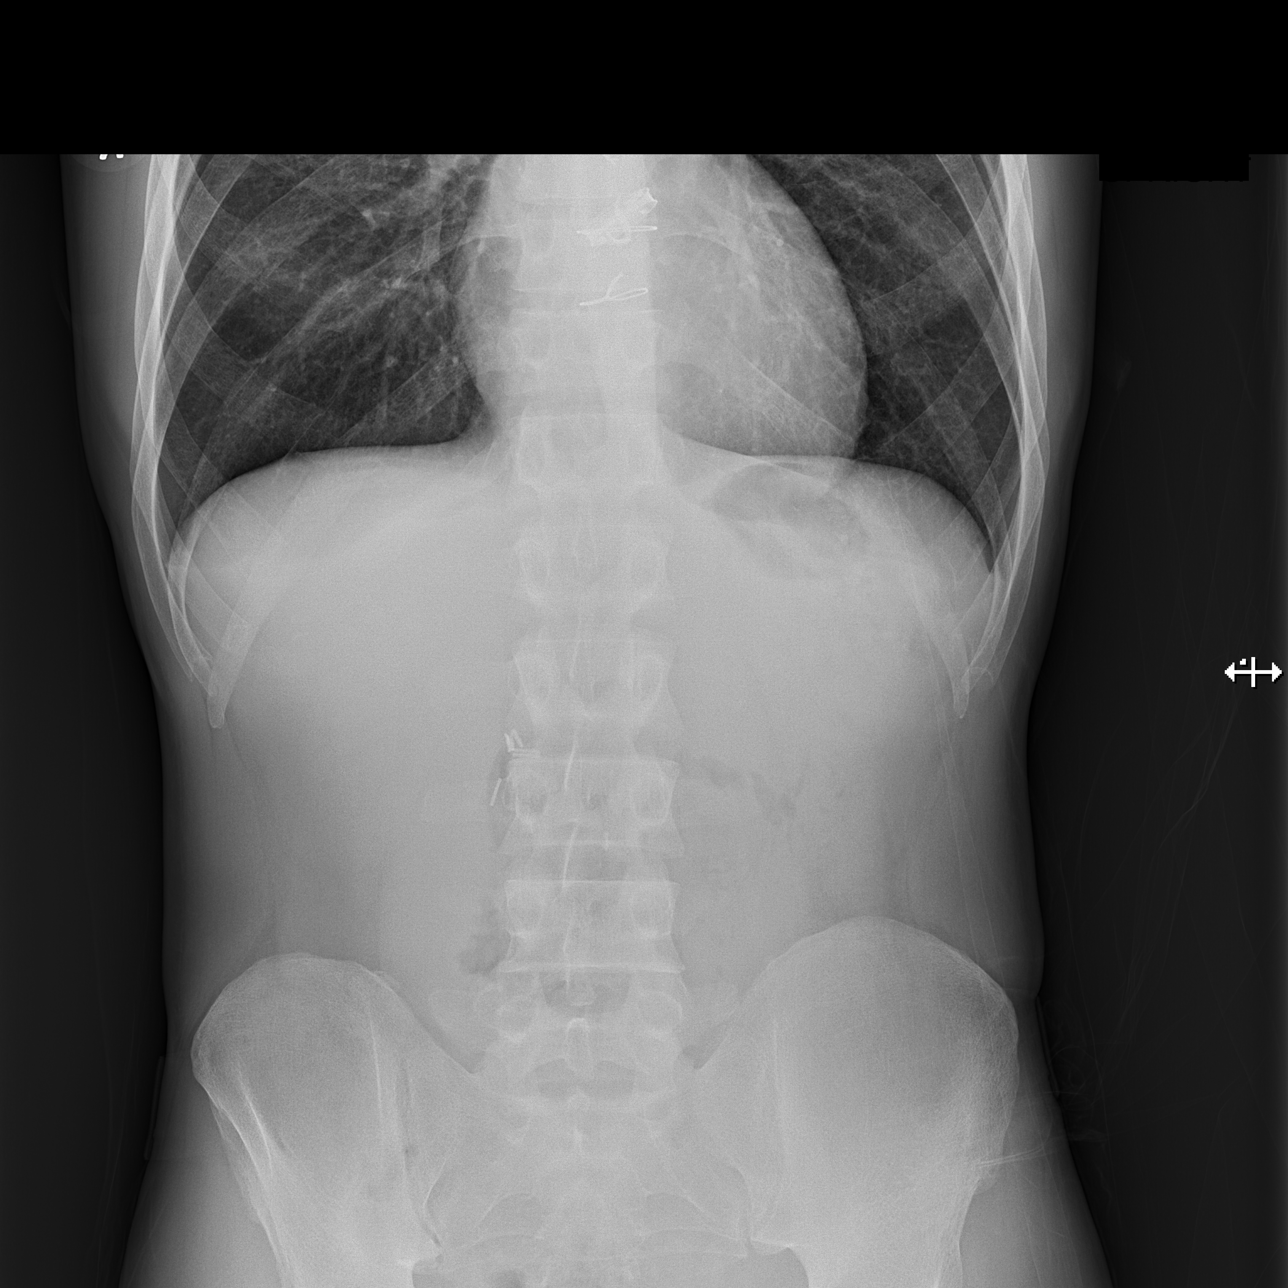

[t abdomen supine]
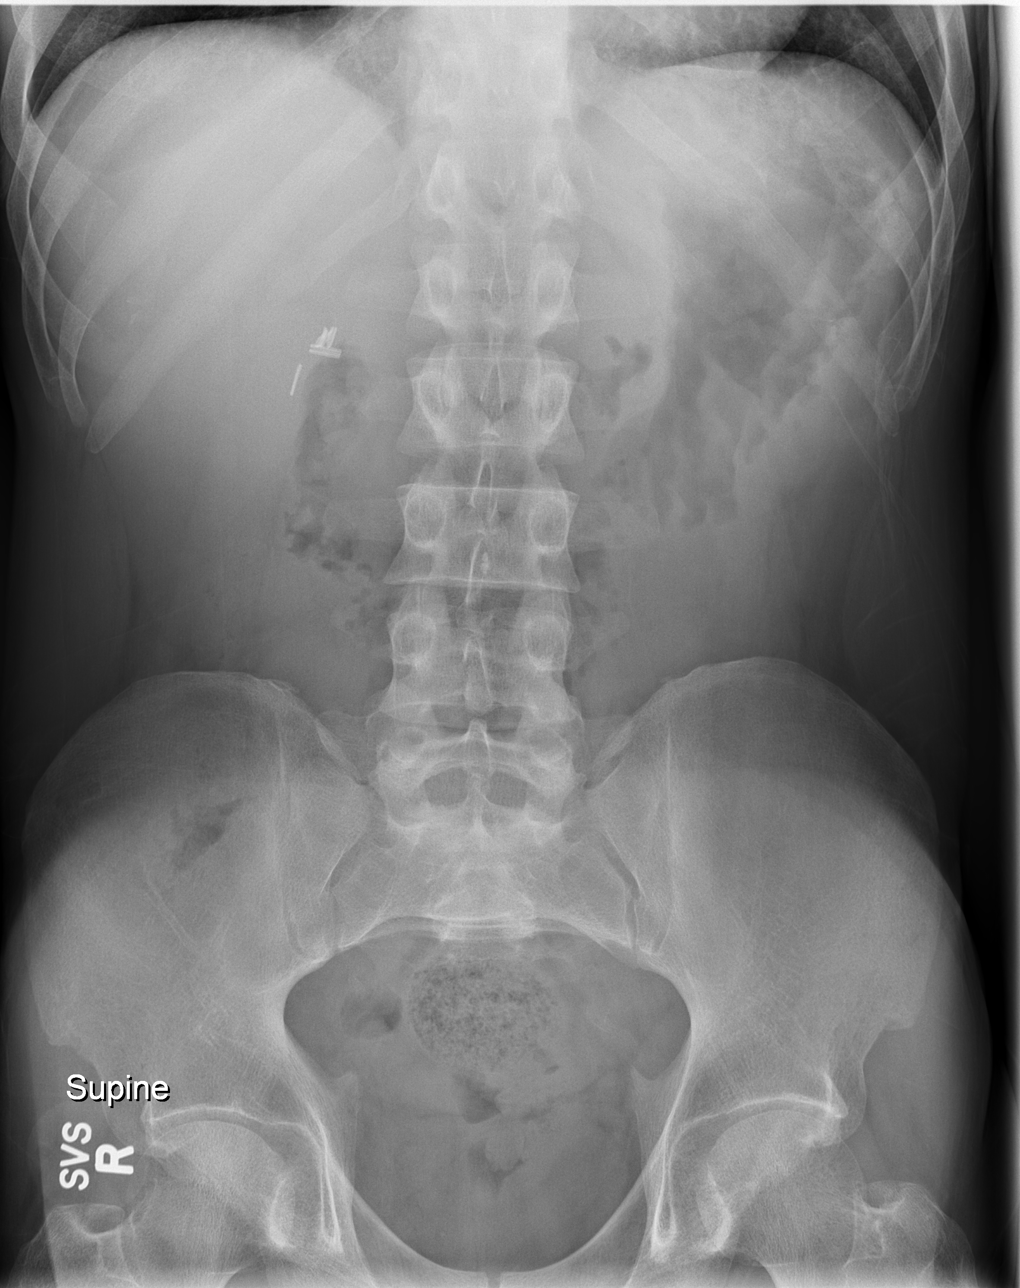

[3 of 3 positions shown; findings below may reference images not displayed]

FINDINGS: Normal sized heart. Clear lungs. The lungs remain hyperexpanded.
Median sternotomy wires and prosthetic aortic valve.

Normal bowel gas pattern without free peritoneal air.
Cholecystectomy clips. Unremarkable bones.
IMPRESSION: No acute abnormality.  Stable changes of COPD.

## 2017-12-06 LAB — COMPREHENSIVE METABOLIC PANEL
AG RATIO: 1.7 (calc) (ref 1.0–2.5)
ALT: 8 U/L — AB (ref 9–46)
AST: 15 U/L (ref 10–35)
Albumin: 4.7 g/dL (ref 3.6–5.1)
Alkaline phosphatase (APISO): 56 U/L (ref 40–115)
BILIRUBIN TOTAL: 0.4 mg/dL (ref 0.2–1.2)
BUN / CREAT RATIO: 11 (calc) (ref 6–22)
BUN: 16 mg/dL (ref 7–25)
CALCIUM: 9.4 mg/dL (ref 8.6–10.3)
CHLORIDE: 105 mmol/L (ref 98–110)
CO2: 29 mmol/L (ref 20–32)
Creat: 1.43 mg/dL — ABNORMAL HIGH (ref 0.70–1.33)
GLOBULIN: 2.7 g/dL (ref 1.9–3.7)
Glucose, Bld: 77 mg/dL (ref 65–99)
Potassium: 4.3 mmol/L (ref 3.5–5.3)
SODIUM: 139 mmol/L (ref 135–146)
Total Protein: 7.4 g/dL (ref 6.1–8.1)

## 2017-12-06 LAB — CBC
HEMATOCRIT: 38.1 % — AB (ref 38.5–50.0)
HEMOGLOBIN: 13.6 g/dL (ref 13.2–17.1)
MCH: 38.2 pg — AB (ref 27.0–33.0)
MCHC: 35.7 g/dL (ref 32.0–36.0)
MCV: 107 fL — ABNORMAL HIGH (ref 80.0–100.0)
MPV: 11.6 fL (ref 7.5–12.5)
Platelets: 157 10*3/uL (ref 140–400)
RBC: 3.56 10*6/uL — AB (ref 4.20–5.80)
RDW: 12.7 % (ref 11.0–15.0)
WBC: 4 10*3/uL (ref 3.8–10.8)

## 2017-12-06 LAB — RPR TITER: RPR Titer: 1:1 {titer} — ABNORMAL HIGH

## 2017-12-06 LAB — RPR: RPR: REACTIVE — AB

## 2017-12-06 LAB — LIPID PANEL
CHOL/HDL RATIO: 3.1 (calc) (ref <5.0)
CHOLESTEROL: 151 mg/dL (ref <200)
HDL: 48 mg/dL (ref >40)
LDL Cholesterol (Calc): 84 mg/dL (calc)
Non-HDL Cholesterol (Calc): 103 mg/dL (calc) (ref <130)
TRIGLYCERIDES: 91 mg/dL (ref <150)

## 2017-12-06 LAB — PROTIME-INR
INR: 2.6 — ABNORMAL HIGH
Prothrombin Time: 27.4 s — ABNORMAL HIGH (ref 9.0–11.5)

## 2017-12-06 LAB — HIV-1 RNA ULTRAQUANT REFLEX TO GENTYP+
HIV 1 RNA Quant: <20 copies/mL
HIV-1 RNA Quant, Log: <1.3 Log copies/mL

## 2017-12-06 LAB — FLUORESCENT TREPONEMAL AB(FTA)-IGG-BLD: FLUORESCENT TREPONEMAL ABS: REACTIVE — AB

## 2017-12-10 MED FILL — DESCOVY 200-25 MG TABS: 200-25 | 30 days supply | Qty: 30 | Fill #5

## 2017-12-10 MED FILL — PREZISTA 600 MG TABS: 600 | 30 days supply | Qty: 60 | Fill #5

## 2017-12-10 MED FILL — ZIDOVUDINE 300 MG TABLET: 300 | 30 days supply | Qty: 60 | Fill #5

## 2017-12-10 MED FILL — RITONAVIR 100 MG TABS: 100 | 30 days supply | Qty: 60 | Fill #5

## 2017-12-10 MED FILL — TIVICAY 50 MG TABLET: 50 | 30 days supply | Qty: 60 | Fill #5

## 2017-12-26 ENCOUNTER — Telehealth: Payer: Self-pay | Admitting: Internal Medicine

## 2017-12-26 NOTE — Telephone Encounter (Signed)
Hi Dr. Havery Moros, please see the messages below and let me know if it is ok to schedule pt with you. Thank you.

## 2017-12-26 NOTE — Telephone Encounter (Signed)
No problem.

## 2017-12-26 NOTE — Telephone Encounter (Signed)
If okay with Dr. Henrene Pastor, that is okay with me.

## 2017-12-26 NOTE — Telephone Encounter (Signed)
Hi Dr. Henrene [name], this pt saw you in 2016. He would like to switch to Dr. Havery Moros because his wife is a pt of his. Are you ok with the switch? Thank you.

## 2018-01-05 ENCOUNTER — Telehealth: Payer: Self-pay | Admitting: *Deleted

## 2018-01-05 NOTE — Telephone Encounter (Signed)
I would do augmentin 1 tab BID x 14 days  This WILL effect hsi coumadin so he needs to go to coumadin clinic asap after starting

## 2018-01-05 NOTE — Telephone Encounter (Signed)
Patient wife called to report that he has an abscess on his tooth and needs to be seen as soon as possible. She advised he is in pain and not sure how long he has had the abscess . Advised will ask Tommy Medal if he can prescribe the patient an antibiotic until he can get in to see the dentist. The patient will stop by the clinic to fill out the form for Dentist.

## 2018-01-07 MED ORDER — AMOXICILLIN-POT CLAVULANATE 875-125 MG PO TABS: 1.0000 | ORAL_TABLET | Freq: Two times a day (BID) | ORAL | 0 refills | Status: DC

## 2018-01-07 NOTE — Telephone Encounter (Signed)
Rx sent to the patient pharmacy on file and call placed to home no one answered. Message left to call the clinic if any questions.

## 2018-01-07 NOTE — Addendum Note (Signed)
Addended by: Janyce Llanos F on: 01/07/2018 03:03 PM   Modules accepted: Orders

## 2018-01-09 MED FILL — RITONAVIR 100 MG TABS: 100 | 30 days supply | Qty: 60 | Fill #0

## 2018-01-09 MED FILL — ZIDOVUDINE 300 MG TABLET: 300 | 30 days supply | Qty: 60 | Fill #0

## 2018-01-09 MED FILL — TIVICAY 50 MG TABLET: 50 | 30 days supply | Qty: 60 | Fill #0

## 2018-01-09 MED FILL — PREZISTA 600 MG TABS: 600 | 30 days supply | Qty: 60 | Fill #0

## 2018-01-09 MED FILL — DESCOVY 200-25 MG TABS: 200-25 | 30 days supply | Qty: 30 | Fill #0

## 2018-01-15 ENCOUNTER — Other Ambulatory Visit: Payer: Medicaid Other

## 2018-01-15 ENCOUNTER — Other Ambulatory Visit: Payer: Self-pay | Admitting: Family

## 2018-01-15 ENCOUNTER — Other Ambulatory Visit: Payer: Self-pay | Admitting: Pharmacist

## 2018-01-15 DIAGNOSIS — Z952 Presence of prosthetic heart valve: Secondary | ICD-10-CM

## 2018-01-15 LAB — PROTIME-INR
INR: 2.48
Prothrombin Time: 26.5 seconds — ABNORMAL HIGH (ref 11.4–15.2)

## 2018-01-15 NOTE — Progress Notes (Signed)
Filled patient's pill box today. 

## 2018-01-22 NOTE — Telephone Encounter (Signed)
Pt has appt on 02/02/18 with Dr. Havery Moros.

## 2018-02-02 ENCOUNTER — Ambulatory Visit (INDEPENDENT_AMBULATORY_CARE_PROVIDER_SITE_OTHER): Payer: Medicaid Other | Admitting: Gastroenterology

## 2018-02-02 ENCOUNTER — Encounter: Payer: Self-pay | Admitting: Gastroenterology

## 2018-02-02 VITALS — BP 160/98 | HR 78 | Ht 68.5 in | Wt 132.0 lb

## 2018-02-02 DIAGNOSIS — R1010 Upper abdominal pain, unspecified: Secondary | ICD-10-CM

## 2018-02-02 DIAGNOSIS — R112 Nausea with vomiting, unspecified: Secondary | ICD-10-CM | POA: Diagnosis not present

## 2018-02-02 DIAGNOSIS — Z7901 Long term (current) use of anticoagulants: Secondary | ICD-10-CM

## 2018-02-02 DIAGNOSIS — Z1211 Encounter for screening for malignant neoplasm of colon: Secondary | ICD-10-CM

## 2018-02-02 DIAGNOSIS — Z952 Presence of prosthetic heart valve: Secondary | ICD-10-CM

## 2018-02-02 MED ORDER — SUPREP BOWEL PREP KIT 17.5-3.13-1.6 GM/177ML PO SOLN: ORAL | 0 refills | Status: DC

## 2018-02-02 NOTE — Progress Notes (Signed)
HPI :  52 y/o male with a history of HIV (most recent CD4 count 290), history of mechanical aortic valve replacement in 1997, history of DVT and CVA, here to establish GI care with me. Previously followed with Dr. Henrene Pastor, last seen about 3 years ago.   He reports he is due for screening colonoscopy. He states he had a colonoscopy a very long time ago, and does not really results. He has roughly 2 stools a day, tentatively on the looser side. He denies any blood in his stools. He denies any family history of colon cancer.  He is also had ongoing problems with nausea and vomiting. He reports nausea in the morning when he wakes, associated with occasional vomiting. He reports eating actually helps to minimize his nausea and his vomiting. He has associated upper abdominal discomfort usually rated 4-10-2 out of 10. He denies any heartburn. He has occasional dysphagia with liquids but not solids. His weights been stable around 132 pounds. He uses Compazine and Zofran which sometimes help the symptoms. He has been placed on omeprazole and Zantac in the past which did not help her symptoms at all. She has had these symptoms ongoing since the 1990s or so. He reports an EGD in 2014 which showed a small gastric AVM and otherwise normal. He had a normal barium swallow in 2016. He had a CT scan in 2014 which was normal, and he had a cholecystectomy in 2016 which did not change his symptoms at all. He does endorse using marijuana a few times a day. He reports the symptoms started before he started using marijuana. He was also incarcerated from 1993-2000 at which point he did not use any marijuana, states his symptoms with the exact same as they are now. He does have HIV in states there has been concern that the Norvir and Prezista may be causing some of his symptoms. He denies any alcohol use for the past 6 months, and for that he drank routinely. He denies any tobacco use. He denies any benefit with having a hot  shower.  He denies any cardiopulmonary symptoms. He's been seen by his cardiologist within the past week and was told that he could stop his Coumadin 5 days before the procedure, and utilize a Lovenox bridge starting 3 days before the procedure. He is having his teeth pulled around the same time as any endoscopic evaluation, hoping to coordinate all of his procedures within a small window time frame.   Labs in care-everywhere - CBC and LFTs renal panel normal  EGD 07/24/2012 - small gastric AVM, otherwise normal Barium swallow -12/01/14 - normal esophogram  Past Medical History:  Diagnosis Date  . Abdominal pain   . Anemia   . Arthritis   . Back pain 02/13/2016  . Constipation   . Diarrhea   . Foot lesion 12/19/2014  . Gallstones   . Gastric AVM   . GERD (gastroesophageal reflux disease)   . GI bleed   . HIV (human immunodeficiency virus infection) ([place])   . Hypertension   . IBS (irritable bowel syndrome)   . Infectious colitis   . Interstitial cystitis   . Mechanical heart valve present   . Nausea & vomiting   . Pancreatitis   . 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 Stroke ([place])   . Weight loss, unintentional      Past Surgical History:  Procedure Laterality Date  . AORTIC VALVE REPLACEMENT    . CARDIAC SURGERY    .  CHOLECYSTECTOMY  02/12/2012   Procedure: LAPAROSCOPIC CHOLECYSTECTOMY;  Surgeon: Stark Klein, MD;  Location: [facility];  Service: General;  Laterality: N/A;  . ESOPHAGOGASTRODUODENOSCOPY N/A 07/24/2012   Procedure: ESOPHAGOGASTRODUODENOSCOPY (EGD);  Surgeon: Beryle Beams, MD;  Location: [facility] ENDOSCOPY;  Service: Endoscopy;  Laterality: N/A;  . KNEE SURGERY     Family History  Problem Relation Age of Onset  . Hypertension Father   . Prostate cancer Father   . Stomach cancer Father   . Hypertension Sister   . Diabetes Maternal Aunt   . Cancer - Other Cousin   . Parkinson's disease Paternal Aunt    Social History   Tobacco Use  . Smoking  status: Former Smoker    Packs/day: 0.10    Years: 20.00    Pack years: 2.00    Types: Cigars  . Smokeless tobacco: Never Used  . Tobacco comment: cutting back  Substance Use Topics  . Alcohol use: Not Currently    Alcohol/week: 0.0 standard drinks    Comment: 12oz beer/ per week   . Drug use: Yes    Frequency: 7.0 times per week    Types: Marijuana    Comment: daily 2 joints    Current Outpatient Medications  Medication Sig Dispense Refill  . darunavir (PREZISTA) 600 MG tablet Take 1 tablet (600 mg total) by mouth 2 (two) times daily with a meal. 60 tablet 5  . dolutegravir (TIVICAY) 50 MG tablet Take 1 tablet (50 mg total) by mouth 2 (two) times daily. 60 tablet 5  . emtricitabine-tenofovir AF (DESCOVY) 200-25 MG tablet Take 1 tablet by mouth daily. 30 tablet 5  . ENSURE (ENSURE) Take 237 mLs by mouth 2 (two) times daily between meals. Provide a case per month: BMI<20 237 mL 6  . EPINEPHrine (EPI-PEN) 0.3 mg/0.3 mL DEVI Inject 0.3 mLs (0.3 mg total) into the muscle once. 1 Device 1  . escitalopram (LEXAPRO) 20 MG tablet TAKE 1 TABLET BY MOUTH   DAILY 30 tablet 1  . ondansetron (ZOFRAN ODT) 8 MG disintegrating tablet Take 1 tablet (8 mg total) by mouth every 8 (eight) hours as needed for nausea or vomiting. 30 tablet 0  . Opium 10 MG/ML (1%) TINC Take 0.6 mLs (6 mg total) by mouth every 6 (six) hours. 72 mL 0  . prochlorperazine (COMPAZINE) 10 MG tablet TAKE 1 TABLET BY MOUTH EVERY 6 HOURS AS NEEDED FOR NAUSEA 120 tablet 0  . ranitidine (ZANTAC) 300 MG tablet TAKE 1 TABLET (300 MG TOTAL) BY MOUTH AT BEDTIME. 30 tablet 3  . ritonavir (NORVIR) 100 MG TABS tablet Take 1 tablet (100 mg total) by mouth 2 (two) times daily with a meal. 60 tablet 5  . valACYclovir (VALTREX) 1000 MG tablet TAKE 1 TABLET (1,000 MG TOTAL) BY MOUTH DAILY. 30 tablet 4  . warfarin (COUMADIN) 1 MG tablet Take as directed with 5mg  tablet per [facility] on Fridays.    Marland Kitchen warfarin (COUMADIN) 5 MG tablet 5 mg. Take  one tablet Sat. Thru Thursday    . zidovudine (RETROVIR) 300 MG tablet Take 1 tablet (300 mg total) by mouth 2 (two) times daily. 60 tablet 5   No current facility-administered medications for this visit.    Facility-Administered Medications Ordered in Other Visits  Medication Dose Route Frequency Provider Last Rate Last Dose  . 0.9 %  sodium chloride infusion   Intravenous Once Campbell Riches, MD       Allergies  Allergen Reactions  . Bactrim [  Sulfamethoxazole-Trimethoprim]   . Bee Venom Anaphylaxis  . Sulfa Antibiotics Anaphylaxis  . Truvada [Emtricitabine-Tenofovir Df] Anaphylaxis and Rash    Takes plain tenofovir at home  . Lidoderm [Lidocaine] Other (See Comments)    Reaction unknown  . Raltegravir     resistance  . Ceftriaxone Rash  . Sulfamethoxazole Itching, Other (See Comments) and Rash    Other reaction(s): Hypotension (ALLERGY/intolerance)     Review of Systems: All systems reviewed and negative except where noted in HPI.   Lab Results  Component Value Date   WBC 4.0 11/28/2017   HGB 13.6 11/28/2017   HCT 38.1 (L) 11/28/2017   MCV 107.0 (H) 11/28/2017   PLT 157 11/28/2017    Lab Results  Component Value Date   CREATININE 1.43 (H) 11/28/2017   BUN 16 11/28/2017   NA 139 11/28/2017   K 4.3 11/28/2017   CL 105 11/28/2017   CO2 29 11/28/2017     Lab Results  Component Value Date   ALT 8 (L) 11/28/2017   AST 15 11/28/2017   ALKPHOS 77 07/25/2016   BILITOT 0.4 11/28/2017    Lab Results  Component Value Date   LIPASE 20 07/03/2015     Physical Exam: BP (!) 160/98   Pulse 78   Ht 5' 8.5" (1.74 m)   Wt 132 lb (59.9 kg)   BMI 19.78 kg/m  Constitutional: Pleasant, thin male in no acute distress. HEENT: Normocephalic and atraumatic. Conjunctivae are normal. No scleral icterus. Neck supple.  Cardiovascular: Normal rate, regular rhythm. Mechanical aortic valve Pulmonary/chest: Effort normal and breath sounds normal. No wheezing, rales or  rhonchi. Abdominal: Soft, nondistended, nontender.  There are no masses palpable. No hepatomegaly. Extremities: no edema Lymphadenopathy: No cervical adenopathy noted. Neurological: Alert and oriented to person place and time. Skin: Skin is warm and dry. No rashes noted. Psychiatric: Normal mood and affect. Behavior is normal.   ASSESSMENT AND PLAN: 52 year old male with multiple medical problems here for assessment of the following issues:  Chronic nausea / vomiting / upper abdominal discomfort / marijuana use - interestingly appears better with eating, argues against gastroparesis. He's had prior imaging of his abdomen and EGD 5-6 years ago which looked okay. He reports worsening symptoms over time. It's possible some of this is due to his HIV regimen as outlined although difficult to manage that as he has drug resistant HIV and sounds like he has limited options for that. I counseled him extensively on marijuana use and how this can be confounding this presentation. I recommend he stop it however he is adamant it is not related (he's been off it in the past which did not help, he thinks it helps him function). Suspect functional changes given the duration of symptoms (>20 years). He is having his teeth pulled to be done after his upcoming colonoscopy. I offered him an endoscopy to ensure okay given his worsening symptoms, he strongly wished to have this done. Recs for his anticoagulation as below, will await his results. He declined trial of PPI, has not helped in the past. Continue antiemetics as needed.  Colon cancer screening / anticoagulated / mechanical aortic valve - due for routine screening. Discussed options and he wants to proceed with optical colonoscopy. He has been seen by cardiology - Dr. Beau Fanny at [facility] - who has already cleared him to proceed with colonoscopy. He will need to hold coumadin for 5 days prior to the procedure and then start lovenox 3 days before the procedure. Further  recommendations pending the results.   Llano Cellar, MD [facility]

## 2018-02-02 NOTE — Patient Instructions (Addendum)
If you are age 50 or older, your body mass index should be between 23-30. Your Body mass index is 19.78 kg/m. If this is out of the aforementioned range listed, please consider follow up with your Primary Care Provider.  If you are age 9 or younger, your body mass index should be between 19-25. Your Body mass index is 19.78 kg/m. If this is out of the aformentioned range listed, please consider follow up with your Primary Care Provider.   You have been scheduled for an endoscopy and colonoscopy. Please follow the written instructions given to you at your visit today. Please pick up your prep supplies at the pharmacy within the next 1-3 days. If you use inhalers (even only as needed), please bring them with you on the day of your procedure. Your physician has requested that you go to www.startemmi.com and enter the access code given to you at your visit today. This web site gives a general overview about your procedure. However, you should still follow specific instructions given to you by our office regarding your preparation for the procedure.  Please hold your Coumadin starting on 03-04-18. According to Dr. Beau Fanny you will need a Lovenox brige for 3 days prior to your procedure. We understand you have the injections to administer. Please begin injections on 03-06-18 for 3 days.   Thank you for entrusting me with your care and for choosing [facility], Dr. Portage Cellar

## 2018-02-09 MED FILL — TIVICAY 50 MG TABLET: 50 | 30 days supply | Qty: 60 | Fill #1

## 2018-02-09 MED FILL — DESCOVY 200-25 MG TABS: 200-25 | 30 days supply | Qty: 30 | Fill #1

## 2018-02-09 MED FILL — ZIDOVUDINE 300 MG TABLET: 300 | 30 days supply | Qty: 60 | Fill #1

## 2018-02-09 MED FILL — RITONAVIR 100 MG TABS: 100 | 30 days supply | Qty: 60 | Fill #1

## 2018-02-09 MED FILL — PREZISTA 600 MG TABS: 600 | 30 days supply | Qty: 60 | Fill #1

## 2018-03-09 ENCOUNTER — Encounter: Payer: Self-pay | Admitting: Gastroenterology

## 2018-03-09 ENCOUNTER — Ambulatory Visit (AMBULATORY_SURGERY_CENTER): Payer: Medicaid Other | Admitting: Gastroenterology

## 2018-03-09 VITALS — BP 127/73 | HR 83 | Temp 99.3°F | Resp 20 | Ht 68.0 in | Wt 132.0 lb

## 2018-03-09 DIAGNOSIS — R112 Nausea with vomiting, unspecified: Secondary | ICD-10-CM

## 2018-03-09 DIAGNOSIS — D128 Benign neoplasm of rectum: Secondary | ICD-10-CM | POA: Diagnosis not present

## 2018-03-09 DIAGNOSIS — Z1211 Encounter for screening for malignant neoplasm of colon: Secondary | ICD-10-CM | POA: Diagnosis not present

## 2018-03-09 DIAGNOSIS — D123 Benign neoplasm of transverse colon: Secondary | ICD-10-CM

## 2018-03-09 DIAGNOSIS — K3189 Other diseases of stomach and duodenum: Secondary | ICD-10-CM

## 2018-03-09 DIAGNOSIS — D129 Benign neoplasm of anus and anal canal: Secondary | ICD-10-CM

## 2018-03-09 DIAGNOSIS — D120 Benign neoplasm of cecum: Secondary | ICD-10-CM

## 2018-03-09 MED ORDER — SODIUM CHLORIDE 0.9 % IV SOLN: 500.0000 mL | Freq: Once | INTRAVENOUS | Status: DC

## 2018-03-09 MED FILL — PREZISTA 600 MG TABS: 600 | 30 days supply | Qty: 60 | Fill #2

## 2018-03-09 MED FILL — DESCOVY 200-25 MG TABS: 200-25 | 30 days supply | Qty: 30 | Fill #2

## 2018-03-09 MED FILL — ZIDOVUDINE 300 MG TABLET: 300 | 30 days supply | Qty: 60 | Fill #2

## 2018-03-09 MED FILL — TIVICAY 50 MG TABLET: 50 | 30 days supply | Qty: 60 | Fill #2

## 2018-03-09 MED FILL — RITONAVIR 100 MG TABS: 100 | 30 days supply | Qty: 60 | Fill #2

## 2018-03-09 NOTE — Progress Notes (Signed)
Pt's states no medical or surgical changes since previsit or office visit. 

## 2018-03-09 NOTE — Patient Instructions (Signed)
YOU HAD AN ENDOSCOPIC PROCEDURE TODAY AT [facility]:   Refer to the procedure report that was given to you for any specific questions about what was found during the examination.  If the procedure report does not answer your questions, please call your gastroenterologist to clarify.  If you requested that your care partner not be given the details of your procedure findings, then the procedure report has been included in a sealed envelope for you to review at your convenience later.  YOU SHOULD EXPECT: Some feelings of bloating in the abdomen. Passage of more gas than usual.  Walking can help get rid of the air that was put into your GI tract during the procedure and reduce the bloating. If you had a lower endoscopy (such as a colonoscopy or flexible sigmoidoscopy) you may notice spotting of blood in your stool or on the toilet paper. If you underwent a bowel prep for your procedure, you may not have a normal bowel movement for a few days.  Please Note:  You might notice some irritation and congestion in your nose or some drainage.  This is from the oxygen used during your procedure.  There is no need for concern and it should clear up in a day or so.  SYMPTOMS TO REPORT IMMEDIATELY:   Following lower endoscopy (colonoscopy or flexible sigmoidoscopy):  Excessive amounts of blood in the stool  Significant tenderness or worsening of abdominal pains  Swelling of the abdomen that is new, acute  Fever of 100F or higher   Following upper endoscopy (EGD)  Vomiting of blood or coffee ground material  New chest pain or pain under the shoulder blades  Painful or persistently difficult swallowing  New shortness of breath  Fever of 100F or higher  Black, tarry-looking stools  For urgent or emergent issues, a gastroenterologist can be reached at any hour by calling [phone].   DIET:  We do recommend a small meal at first, but then you may proceed to your regular diet.  Drink  plenty of fluids but you should avoid alcoholic beverages for 24 hours.  ACTIVITY:  You should plan to take it easy for the rest of today and you should NOT DRIVE or use heavy machinery until tomorrow (because of the sedation medicines used during the test).    FOLLOW UP: Our staff will call the number listed on your records the next business day following your procedure to check on you and address any questions or concerns that you may have regarding the information given to you following your procedure. If we do not reach you, we will leave a message.  However, if you are feeling well and you are not experiencing any problems, there is no need to return our call.  We will assume that you have returned to your regular daily activities without incident.  If any biopsies were taken you will be contacted by phone or by letter within the next 1-3 weeks.  Please call us at [phone] if you have not heard about the biopsies in 3 weeks.   Await for biopsy results Polyps (handout given) Resume lovenox tonight (patient is to continue to hold Coumadin for dental procedure later this week)   SIGNATURES/CONFIDENTIALITY: You and/or your care partner have signed paperwork which will be entered into your electronic medical record.  These signatures attest to the fact that that the information above on your After Visit Summary has been reviewed and is understood.  Full responsibility of  the confidentiality of this discharge information lies with you and/or your care-partner. 

## 2018-03-09 NOTE — Progress Notes (Signed)
Called to room to assist during endoscopic procedure.  Patient ID and intended procedure confirmed with present staff. Received instructions for my participation in the procedure from the performing physician.  

## 2018-03-09 NOTE — Progress Notes (Signed)
Report given to PACU, vss 

## 2018-03-09 NOTE — Op Note (Signed)
[facility] Patient Name: Brandon [name] Procedure Date: 03/09/2018 2:37 PM MRN: [id number] Endoscopist: Remo Lipps P. Havery Moros , MD Age: 52 Referring MD:  Date of Birth: 1965/06/18 Gender: Male Account #: [id number] Procedure:                Colonoscopy Indications:              Screening for colorectal malignant neoplasm Medicines:                Monitored Anesthesia Care Procedure:                Pre-Anesthesia Assessment:                           - Prior to the procedure, a History and Physical                            was performed, and patient medications and                            allergies were reviewed. The patient's tolerance of                            previous anesthesia was also reviewed. The risks                            and benefits of the procedure and the sedation                            options and risks were discussed with the patient.                            All questions were answered, and informed consent                            was obtained. Prior Anticoagulants: The patient has                            taken Lovenox (enoxaparin), last dose was 1 day                            prior to procedure. ASA Grade Assessment: III - A                            patient with severe systemic disease. After                            reviewing the risks and benefits, the patient was                            deemed in satisfactory condition to undergo the                            procedure.  After obtaining informed consent, the colonoscope                            was passed under direct vision. Throughout the                            procedure, the patient's blood pressure, pulse, and                            oxygen saturations were monitored continuously. The                            Model PCF-[id number]) scope was introduced                            through the anus and advanced to the the  cecum,                            identified by appendiceal orifice and ileocecal                            valve. The colonoscopy was performed without                            difficulty. The patient tolerated the procedure                            well. The quality of the bowel preparation was                            good. The ileocecal valve, appendiceal orifice, and                            rectum were photographed. Scope In: 2:55:00 PM Scope Out: 3:12:59 PM Scope Withdrawal Time: 0 hours 15 minutes 52 seconds  Total Procedure Duration: 0 hours 17 minutes 59 seconds  Findings:                 Hemorrhoids were found on perianal exam.                           A diminutive polyp was found in the cecum. The                            polyp was sessile. The polyp was removed with a                            cold biopsy forceps. Resection and retrieval were                            complete.                           A 3 mm polyp was found in the transverse colon. The  polyp was sessile. The polyp was removed with a                            cold biopsy forceps. Resection and retrieval were                            complete.                           A diminutive polyp was found in the rectum. The                            polyp was sessile. The polyp was removed with a                            cold biopsy forceps. Resection and retrieval were                            complete.                           A few small-mouthed diverticula were found in the                            ascending colon.                           Internal hemorrhoids were found during retroflexion.                           The exam was otherwise without abnormality. Complications:            No immediate complications. Estimated blood loss:                            Minimal. Estimated Blood Loss:     Estimated blood loss was minimal. Impression:               -  Hemorrhoids found on perianal exam.                           - One diminutive polyp in the cecum, removed with a                            cold biopsy forceps. Resected and retrieved.                           - One 3 mm polyp in the transverse colon, removed                            with a cold biopsy forceps. Resected and retrieved.                           - One diminutive polyp in the rectum, removed with  a cold biopsy forceps. Resected and retrieved.                           - Diverticulosis in the ascending colon.                           - Internal hemorrhoids.                           - The examination was otherwise normal. Recommendation:           - Patient has a contact number available for                            emergencies. The signs and symptoms of potential                            delayed complications were discussed with the                            patient. Return to normal activities tomorrow.                            Written discharge instructions were provided to the                            patient.                           - Resume previous diet.                           - Continue present medications.                           - Resume lovenox tonight (patient is continuing to                            hold Coumadin for dental procedure later this week)                           - Await pathology results. Remo Lipps P. Armbruster, MD 03/09/2018 3:20:59 PM This report has been signed electronically.

## 2018-03-09 NOTE — Op Note (Signed)
[facility] Patient Name: Brandon [name] Procedure Date: 03/09/2018 2:37 PM MRN: [id number] Endoscopist: Remo Lipps P. Havery Moros , MD Age: 52 Referring MD:  Date of Birth: November 07, 1965 Gender: Male Account #: [id number] Procedure:                Upper GI endoscopy Indications:              persistent nausea with vomiting Medicines:                Monitored Anesthesia Care Procedure:                Pre-Anesthesia Assessment:                           - Prior to the procedure, a History and Physical                            was performed, and patient medications and                            allergies were reviewed. The patient's tolerance of                            previous anesthesia was also reviewed. The risks                            and benefits of the procedure and the sedation                            options and risks were discussed with the patient.                            All questions were answered, and informed consent                            was obtained. Prior Anticoagulants: The patient has                            taken Lovenox (enoxaparin), last dose was 1 day                            prior to procedure. ASA Grade Assessment: III - A                            patient with severe systemic disease. After                            reviewing the risks and benefits, the patient was                            deemed in satisfactory condition to undergo the                            procedure.  After obtaining informed consent, the endoscope was                            passed under direct vision. Throughout the                            procedure, the patient's blood pressure, pulse, and                            oxygen saturations were monitored continuously. The                            Model GIF-[id number]) scope was introduced                            through the mouth, and advanced to the second part                             of duodenum. The upper GI endoscopy was                            accomplished without difficulty. The patient                            tolerated the procedure well. Scope In: Scope Out: Findings:                 Esophagogastric landmarks were identified: the                            Z-line was found at 43 cm, the gastroesophageal                            junction was found at 43 cm and the upper extent of                            the gastric folds was found at 43 cm from the                            incisors.                           The exam of the esophagus was otherwise normal.                           The entire examined stomach was normal. Biopsies                            were taken with a cold forceps for Helicobacter                            pylori testing.                           The duodenal bulb and second  portion of the                            duodenum were normal. Complications:            No immediate complications. Estimated blood loss:                            Minimal. Estimated Blood Loss:     Estimated blood loss was minimal. Impression:               - Esophagogastric landmarks identified.                           - Normal esophagus.                           - Normal stomach. Biopsied to rule out H pylori.                           - Normal duodenal bulb and second portion of the                            duodenum. Recommendation:           - Patient has a contact number available for                            emergencies. The signs and symptoms of potential                            delayed complications were discussed with the                            patient. Return to normal activities tomorrow.                            Written discharge instructions were provided to the                            patient.                           - Resume previous diet.                           - Continue present  medications.                           - Await pathology results. Remo Lipps P. Wally Shevchenko, MD 03/09/2018 3:24:09 PM This report has been signed electronically.

## 2018-03-10 ENCOUNTER — Encounter (HOSPITAL_COMMUNITY): Payer: Self-pay | Admitting: *Deleted

## 2018-03-10 ENCOUNTER — Telehealth: Payer: Self-pay | Admitting: *Deleted

## 2018-03-10 ENCOUNTER — Other Ambulatory Visit: Payer: Self-pay

## 2018-03-10 NOTE — Progress Notes (Signed)
Pt denies SOB and chest pain. Pt stated that he is under the care of DR. Vasu, Cardiology, at [facility]. Pt stated that a stress test was performed > 5 years ago. Pt stated that last dose of Coumadin was Tuesday, 03/03/18, Lovenox was started Thursday, 03/06/15 and last dose will be Thursday night until after surgery. Pt made aware to stop taking vitamins, fish oil and herbal medications. Do not take any NSAIDs ie: Ibuprofen, Advil, Naproxen(Aleve), Motrin, BC and Goody Powder. Pt verbalized understanding of all pre-op instructions. PA, Anesthesiology, asked to review pt cardiac history.

## 2018-03-10 NOTE — Telephone Encounter (Signed)
  Follow up Call-  Call back number 03/09/2018  Post procedure Call Back phone  # [phone]  Permission to leave phone message Yes  Some recent data might be hidden     Patient questions:  Do you have a fever, pain , or abdominal swelling? No. Pain Score  0 *  Have you tolerated food without any problems? Yes.    Have you been able to return to your normal activities? Yes.    Do you have any questions about your discharge instructions: Diet   No. Medications  No. Follow up visit  No.  Do you have questions or concerns about your Care? No.  Actions: * If pain score is 4 or above: No action needed, pain <4.

## 2018-03-12 NOTE — Anesthesia Preprocedure Evaluation (Addendum)
Anesthesia Evaluation  Patient identified by MRN, date of birth, ID band Patient awake    Reviewed: Allergy & Precautions, H&P , NPO status , Patient's Chart, lab work & pertinent test results  Airway Mallampati: II   Neck ROM: full    Dental   Pulmonary former smoker,    breath sounds clear to auscultation       Cardiovascular hypertension, + Valvular Problems/Murmurs  Rhythm:regular Rate:Normal  S/p AVR with mechanical valve.  Currently on lovenox bridge.   Neuro/Psych CVA    GI/Hepatic GERD  ,  Endo/Other    Renal/GU      Musculoskeletal  (+) Arthritis ,   Abdominal   Peds  Hematology  (+) HIV,   Anesthesia Other Findings   Reproductive/Obstetrics                            Anesthesia Physical Anesthesia Plan  ASA: III  Anesthesia Plan: General   Post-op Pain Management:    Induction: Intravenous  PONV Risk Score and Plan: 2 and Ondansetron, Dexamethasone, Midazolam and Treatment may vary due to age or medical condition  Airway Management Planned: Oral ETT  Additional Equipment:   Intra-op Plan:   Post-operative Plan: Extubation in OR  Informed Consent: I have reviewed the patients History and Physical, chart, labs and discussed the procedure including the risks, benefits and alternatives for the proposed anesthesia with the patient or authorized representative who has indicated his/her understanding and acceptance.     Plan Discussed with: CRNA, Anesthesiologist and Surgeon  Anesthesia Plan Comments: (PAT note written 03/12/2018 by Myra Gianotti, PA-C. History HIV and mechanical AVR. Lovenox bridge, last dose 03/12/18. He is a SDW, so getting labs and EKG on arrival. )       Anesthesia Quick Evaluation

## 2018-03-12 NOTE — Progress Notes (Signed)
Anesthesia Chart Review: SAME DAY WORK-UP   Case:  [id number] Date/Time:  03/13/18 0900   Procedure:  MULTIPLE EXTRACTION (N/A )   Anesthesia type:  General   Pre-op diagnosis:  NON RESTORABLE   Location:  [facility] OR ROOM 08 / [facility]   Surgeon:  Diona Browner, DDS      DISCUSSION: Patient is a 53 year old male scheduled for the above procedure.  History includes former smoker (quit 08/02/17), mechanical AVR (1997, [facility]), HIV, HTN, CVA 2010, s/p tPA), GERD, gastric AVM, pancreatitis, recurrent C. difficile colitis, DVT (UE, 2007).  - EGD (chronic nausea) on 03/09/18 showed: Normal esophagus, normal stomach, normal duodenal bulb and second portion of the duodenum. H. Pylori testing results still pending.  - Colonoscopy (screening) on 03/09/18 showed: Hemorrhoids. One diminutive polyp in the cecum and rectum, both removed. One 3 mm polyp in the transverse colon, removed.  Diverticulosis in the ascending colon.  Internal hemorrhoids. Biopsy (polyps) results still pending.   He reported last warfarin 03/03/18 with Lovenox bridge 03/05/18-03/12/18. Instructions per [facility]. PCP and cardiologist are aware of surgery plans (see below).  He will need labs and EKG on arrival. Further evaluation by his anesthesia team on the day of surgery.    PROVIDERS: - PCP is with [facility]. Last visit on 02/16/18 with Thalia Party, MD. He was aware of plans for dental extractions. He did order a 24 hour BP monitor due to variable BP (174/84 on 02/16/18).   Conni Slipper, MD is cardioloigst ([place]). Last visit 01/26/18. He ordered a routine echo in November which was stable. Patient inquired if he was cleared for sedation for dental surgery. Dr. Beau Fanny wrote on 02/19/18, "Yes he is ok to undergo sedation." Pacolet Cellar, MD is GI.  ID is with [facility]. Last visit 11/28/17 with Mauricio Po, NP and noted to have "adequately controlled HIV disease." HIV-1 RNA < 20  detected. CD4% Helper T cell 16, CD4 T Cell ABS 290.   LABS: He is for labs prior to surgery. Labs on 11/28/17 showed a Cr 1.42, H/H 13.6/38.1, PLT 157, AST 15, ALT 8.    EKG: For day of surgery.   CV: Echo 02/06/18 ([place]): SUMMARY The left ventricle is mildly dilated. There is normal left ventricular wall thickness. Left ventricular systolic function is normal. LV ejection fraction = 55%. No segmental wall motion abnormalities seen in the left ventricle The right ventricle is normal in size and function. There is a well-seated bileaflet mechanical aortic valve. Mean pressure  gradient of 9.6 mmHg. There is trace aortic regurgitation. Borderline aortic  root dilatation. There is mild mitral regurgitation. IVC size was mildly dilated. There is no pericardial effusion. There is no significant change in comparison with the last study.  Last stress test and cath > 5 years ago.   Past Medical History:  Diagnosis Date  . Abdominal pain   . Anemia   . Arthritis   . Back pain 02/13/2016  . Constipation   . Depression   . Diarrhea   . Foot lesion 12/19/2014  . Gallstones   . Gastric AVM   . GERD (gastroesophageal reflux disease)   . GI bleed   . HIV (human immunodeficiency virus infection) ([place])   . Hypertension   . IBS (irritable bowel syndrome)   . Infectious colitis   . Interstitial cystitis   . Mechanical heart valve present   . Nausea & vomiting   .  Pancreatitis   . 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 Stroke ([place])   . Weight loss, unintentional     Past Surgical History:  Procedure Laterality Date  . AORTIC VALVE REPLACEMENT    . CARDIAC SURGERY    . CHOLECYSTECTOMY  02/12/2012   Procedure: LAPAROSCOPIC CHOLECYSTECTOMY;  Surgeon: Stark Klein, MD;  Location: [facility];  Service: General;  Laterality: N/A;  . COLONOSCOPY WITH ESOPHAGOGASTRODUODENOSCOPY (EGD)     with polypectomy  . ESOPHAGOGASTRODUODENOSCOPY N/A 07/24/2012    Procedure: ESOPHAGOGASTRODUODENOSCOPY (EGD);  Surgeon: Beryle Beams, MD;  Location[facility] ENDOSCOPY;  Service: Endoscopy;  Laterality: N/A;  . KNEE SURGERY      MEDICATIONS: . 0.9 %  sodium chloride infusion   . darunavir (PREZISTA) 600 MG tablet  . dolutegravir (TIVICAY) 50 MG tablet  . emtricitabine-tenofovir AF (DESCOVY) 200-25 MG tablet  . enoxaparin (LOVENOX) 100 MG/ML injection  . escitalopram (LEXAPRO) 20 MG tablet  . LORazepam (ATIVAN) 1 MG tablet  . ondansetron (ZOFRAN ODT) 8 MG disintegrating tablet  . Opium 10 MG/ML (1%) TINC  . ritonavir (NORVIR) 100 MG TABS tablet  . valACYclovir (VALTREX) 1000 MG tablet  . warfarin (COUMADIN) 1 MG tablet  . warfarin (COUMADIN) 5 MG tablet  . zidovudine (RETROVIR) 300 MG tablet  . ENSURE (ENSURE)  . EPINEPHrine (EPI-PEN) 0.3 mg/0.3 mL DEVI  . prochlorperazine (COMPAZINE) 10 MG tablet  . ranitidine (ZANTAC) 300 MG tablet   . 0.9 %  sodium chloride infusion    Myra Gianotti, PA-C Surgical Short Stay/Anesthesiology [facility] Phone [phone] [facility] Phone [phone] 03/12/2018 12:28 PM

## 2018-03-13 ENCOUNTER — Ambulatory Visit (HOSPITAL_COMMUNITY)
Admission: RE | Admit: 2018-03-13 | Discharge: 2018-03-13 | Disposition: A | Discharge status: Home / Self Care | Payer: Medicaid Other | Attending: Oral Surgery | Admitting: Oral Surgery

## 2018-03-13 ENCOUNTER — Ambulatory Visit (HOSPITAL_COMMUNITY): Payer: Medicaid Other | Admitting: Vascular Surgery

## 2018-03-13 ENCOUNTER — Encounter (HOSPITAL_COMMUNITY): Payer: Self-pay

## 2018-03-13 ENCOUNTER — Other Ambulatory Visit: Payer: Self-pay

## 2018-03-13 ENCOUNTER — Encounter (HOSPITAL_COMMUNITY)
Admission: RE | Disposition: A | Discharge status: Home / Self Care | Payer: Self-pay | Source: Home / Self Care | Attending: Oral Surgery

## 2018-03-13 DIAGNOSIS — I10 Essential (primary) hypertension: Secondary | ICD-10-CM | POA: Insufficient documentation

## 2018-03-13 DIAGNOSIS — F329 Major depressive disorder, single episode, unspecified: Secondary | ICD-10-CM | POA: Diagnosis not present

## 2018-03-13 DIAGNOSIS — M1990 Unspecified osteoarthritis, unspecified site: Secondary | ICD-10-CM | POA: Diagnosis not present

## 2018-03-13 DIAGNOSIS — M278 Other specified diseases of jaws: Secondary | ICD-10-CM | POA: Diagnosis not present

## 2018-03-13 DIAGNOSIS — Z881 Allergy status to other antibiotic agents status: Secondary | ICD-10-CM | POA: Diagnosis not present

## 2018-03-13 DIAGNOSIS — Z79899 Other long term (current) drug therapy: Secondary | ICD-10-CM | POA: Diagnosis not present

## 2018-03-13 DIAGNOSIS — Z8673 Personal history of transient ischemic attack (TIA), and cerebral infarction without residual deficits: Secondary | ICD-10-CM | POA: Insufficient documentation

## 2018-03-13 DIAGNOSIS — K029 Dental caries, unspecified: Secondary | ICD-10-CM | POA: Diagnosis not present

## 2018-03-13 DIAGNOSIS — K219 Gastro-esophageal reflux disease without esophagitis: Secondary | ICD-10-CM | POA: Insufficient documentation

## 2018-03-13 DIAGNOSIS — K090 Developmental odontogenic cysts: Secondary | ICD-10-CM | POA: Diagnosis not present

## 2018-03-13 DIAGNOSIS — K011 Impacted teeth: Secondary | ICD-10-CM | POA: Insufficient documentation

## 2018-03-13 DIAGNOSIS — Z7901 Long term (current) use of anticoagulants: Secondary | ICD-10-CM | POA: Diagnosis not present

## 2018-03-13 DIAGNOSIS — Z952 Presence of prosthetic heart valve: Secondary | ICD-10-CM | POA: Diagnosis not present

## 2018-03-13 DIAGNOSIS — Z888 Allergy status to other drugs, medicaments and biological substances status: Secondary | ICD-10-CM | POA: Diagnosis not present

## 2018-03-13 DIAGNOSIS — Z882 Allergy status to sulfonamides status: Secondary | ICD-10-CM | POA: Diagnosis not present

## 2018-03-13 DIAGNOSIS — K589 Irritable bowel syndrome without diarrhea: Secondary | ICD-10-CM | POA: Insufficient documentation

## 2018-03-13 DIAGNOSIS — B20 Human immunodeficiency virus [HIV] disease: Secondary | ICD-10-CM | POA: Diagnosis not present

## 2018-03-13 HISTORY — PX: MULTIPLE EXTRACTIONS WITH ALVEOLOPLASTY: SHX5342

## 2018-03-13 HISTORY — DX: Major depressive disorder, single episode, unspecified: F32.9

## 2018-03-13 HISTORY — DX: Depression, unspecified: F32.A

## 2018-03-13 LAB — BASIC METABOLIC PANEL WITH GFR
Anion gap: 10 (ref 5–15)
BUN: 19 mg/dL (ref 6–20)
CO2: 21 mmol/L — ABNORMAL LOW (ref 22–32)
Calcium: 8.9 mg/dL (ref 8.9–10.3)
Chloride: 107 mmol/L (ref 98–111)
Creatinine, Ser: 1.4 mg/dL — ABNORMAL HIGH (ref 0.61–1.24)
GFR calc Af Amer: >60 mL/min
GFR calc non Af Amer: 58 mL/min — ABNORMAL LOW
Glucose, Bld: 96 mg/dL (ref 70–99)
Potassium: 4 mmol/L (ref 3.5–5.1)
Sodium: 138 mmol/L (ref 135–145)

## 2018-03-13 LAB — CBC
HCT: 36.5 % — ABNORMAL LOW (ref 39.0–52.0)
Hemoglobin: 12.4 g/dL — ABNORMAL LOW (ref 13.0–17.0)
MCH: 39 pg — ABNORMAL HIGH (ref 26.0–34.0)
MCHC: 34 g/dL (ref 30.0–36.0)
MCV: 114.8 fL — ABNORMAL HIGH (ref 80.0–100.0)
Platelets: 187 10*3/uL (ref 150–400)
RBC: 3.18 MIL/uL — ABNORMAL LOW (ref 4.22–5.81)
RDW: 12.1 % (ref 11.5–15.5)
WBC: 4.7 10*3/uL (ref 4.0–10.5)
nRBC: 0 % (ref 0.0–0.2)

## 2018-03-13 LAB — PROTIME-INR
INR: 1.04
Prothrombin Time: 13.5 s (ref 11.4–15.2)

## 2018-03-13 SURGERY — MULTIPLE EXTRACTION WITH ALVEOLOPLASTY
Anesthesia: General

## 2018-03-13 MED ORDER — MIDAZOLAM HCL 2 MG/2ML IJ SOLN
INTRAMUSCULAR | Status: DC | PRN
  Administered 2018-03-13: 2 mg via INTRAVENOUS

## 2018-03-13 MED ORDER — LIDOCAINE 2% (20 MG/ML) 5 ML SYRINGE
INTRAMUSCULAR | Status: AC
  Filled 2018-03-13: qty 5

## 2018-03-13 MED ORDER — ONDANSETRON HCL 4 MG/2ML IJ SOLN: 4.0000 mg | Freq: Four times a day (QID) | INTRAMUSCULAR | Status: DC | PRN

## 2018-03-13 MED ORDER — SUCCINYLCHOLINE CHLORIDE 200 MG/10ML IV SOSY
PREFILLED_SYRINGE | INTRAVENOUS | Status: AC
  Filled 2018-03-13: qty 10

## 2018-03-13 MED ORDER — GLYCOPYRROLATE PF 0.2 MG/ML IJ SOSY
PREFILLED_SYRINGE | INTRAMUSCULAR | Status: AC
  Filled 2018-03-13: qty 1

## 2018-03-13 MED ORDER — FENTANYL CITRATE (PF) 100 MCG/2ML IJ SOLN
INTRAMUSCULAR | Status: AC
  Administered 2018-03-13: 50 ug via INTRAVENOUS
  Filled 2018-03-13: qty 2

## 2018-03-13 MED ORDER — MIDAZOLAM HCL 2 MG/2ML IJ SOLN
INTRAMUSCULAR | Status: AC
  Filled 2018-03-13: qty 2

## 2018-03-13 MED ORDER — CLINDAMYCIN PHOSPHATE 600 MG/50ML IV SOLN
600.0000 mg | Freq: Once | INTRAVENOUS | Status: AC
  Administered 2018-03-13: 600 mg via INTRAVENOUS
  Filled 2018-03-13: qty 50

## 2018-03-13 MED ORDER — LABETALOL HCL 5 MG/ML IV SOLN
INTRAVENOUS | Status: AC
  Filled 2018-03-13: qty 4

## 2018-03-13 MED ORDER — ONDANSETRON HCL 4 MG/2ML IJ SOLN
INTRAMUSCULAR | Status: DC | PRN
  Administered 2018-03-13: 4 mg via INTRAVENOUS

## 2018-03-13 MED ORDER — FENTANYL CITRATE (PF) 250 MCG/5ML IJ SOLN
INTRAMUSCULAR | Status: AC
  Filled 2018-03-13: qty 5

## 2018-03-13 MED ORDER — BUPIVACAINE HCL 0.25 % IJ SOLN
INTRAMUSCULAR | Status: DC | PRN
  Administered 2018-03-13: 17 mL

## 2018-03-13 MED ORDER — FENTANYL CITRATE (PF) 250 MCG/5ML IJ SOLN
INTRAMUSCULAR | Status: DC | PRN
  Administered 2018-03-13: 100 ug via INTRAVENOUS
  Administered 2018-03-13 (×2): 50 ug via INTRAVENOUS

## 2018-03-13 MED ORDER — CLINDAMYCIN HCL 300 MG PO CAPS: 300.0000 mg | ORAL_CAPSULE | Freq: Three times a day (TID) | ORAL | 0 refills | Status: DC

## 2018-03-13 MED ORDER — OXYCODONE HCL 5 MG PO TABS
ORAL_TABLET | ORAL | Status: AC
  Administered 2018-03-13: 5 mg via ORAL
  Filled 2018-03-13: qty 1

## 2018-03-13 MED ORDER — OXYCODONE HCL 5 MG PO TABS
5.0000 mg | ORAL_TABLET | Freq: Once | ORAL | Status: AC | PRN
  Administered 2018-03-13: 5 mg via ORAL

## 2018-03-13 MED ORDER — 0.9 % SODIUM CHLORIDE (POUR BTL) OPTIME
TOPICAL | Status: DC | PRN
  Administered 2018-03-13: 1000 mL

## 2018-03-13 MED ORDER — BUPIVACAINE-EPINEPHRINE (PF) 0.25% -1:200000 IJ SOLN
INTRAMUSCULAR | Status: AC
  Filled 2018-03-13: qty 30

## 2018-03-13 MED ORDER — LABETALOL HCL 5 MG/ML IV SOLN
INTRAVENOUS | Status: DC | PRN
  Administered 2018-03-13 (×2): 5 mg via INTRAVENOUS

## 2018-03-13 MED ORDER — SODIUM CHLORIDE 0.9 % IR SOLN
Status: DC | PRN
  Administered 2018-03-13: 1

## 2018-03-13 MED ORDER — SUCCINYLCHOLINE CHLORIDE 20 MG/ML IJ SOLN
INTRAMUSCULAR | Status: DC | PRN
  Administered 2018-03-13: 100 mg via INTRAVENOUS

## 2018-03-13 MED ORDER — PROPOFOL 10 MG/ML IV BOLUS
INTRAVENOUS | Status: DC | PRN
  Administered 2018-03-13: 150 mg via INTRAVENOUS
  Administered 2018-03-13: 50 mg via INTRAVENOUS

## 2018-03-13 MED ORDER — SCOPOLAMINE 1 MG/3DAYS TD PT72
MEDICATED_PATCH | TRANSDERMAL | Status: AC
  Filled 2018-03-13: qty 1

## 2018-03-13 MED ORDER — LIDOCAINE HCL (CARDIAC) PF 100 MG/5ML IV SOSY
PREFILLED_SYRINGE | INTRAVENOUS | Status: DC | PRN
  Administered 2018-03-13: 50 mg via INTRATRACHEAL

## 2018-03-13 MED ORDER — HYDRALAZINE HCL 20 MG/ML IJ SOLN
INTRAMUSCULAR | Status: AC
  Administered 2018-03-13: 10 mg via INTRAVENOUS
  Filled 2018-03-13: qty 1

## 2018-03-13 MED ORDER — DEXAMETHASONE SODIUM PHOSPHATE 10 MG/ML IJ SOLN
INTRAMUSCULAR | Status: AC
  Filled 2018-03-13: qty 1

## 2018-03-13 MED ORDER — HYDRALAZINE HCL 20 MG/ML IJ SOLN
10.0000 mg | Freq: Once | INTRAMUSCULAR | Status: AC
  Administered 2018-03-13: 10 mg via INTRAVENOUS

## 2018-03-13 MED ORDER — OXYCODONE-ACETAMINOPHEN 5-325 MG PO TABS: 1.0000 | ORAL_TABLET | ORAL | 0 refills | Status: DC | PRN

## 2018-03-13 MED ORDER — FENTANYL CITRATE (PF) 100 MCG/2ML IJ SOLN
25.0000 ug | INTRAMUSCULAR | Status: DC | PRN
  Administered 2018-03-13: 50 ug via INTRAVENOUS

## 2018-03-13 MED ORDER — LABETALOL HCL 5 MG/ML IV SOLN
10.0000 mg | INTRAVENOUS | Status: DC | PRN
  Administered 2018-03-13 (×2): 10 mg via INTRAVENOUS

## 2018-03-13 MED ORDER — OXYCODONE HCL 5 MG/5ML PO SOLN: 5.0000 mg | Freq: Once | ORAL | Status: AC | PRN

## 2018-03-13 MED ORDER — DEXAMETHASONE SODIUM PHOSPHATE 10 MG/ML IJ SOLN
INTRAMUSCULAR | Status: DC | PRN
  Administered 2018-03-13: 8 mg via INTRAVENOUS

## 2018-03-13 MED ORDER — LACTATED RINGERS IV SOLN
INTRAVENOUS | Status: DC
  Administered 2018-03-13: 10:00:00 via INTRAVENOUS

## 2018-03-13 MED ORDER — SCOPOLAMINE 1 MG/3DAYS TD PT72
MEDICATED_PATCH | TRANSDERMAL | Status: DC | PRN
  Administered 2018-03-13: 1 via TRANSDERMAL

## 2018-03-13 MED ORDER — ONDANSETRON HCL 4 MG/2ML IJ SOLN
INTRAMUSCULAR | Status: AC
  Filled 2018-03-13: qty 2

## 2018-03-13 SURGICAL SUPPLY — 37 items
BUR CROSS CUT FISSURE 1.6 (BURR) ×2 IMPLANT
BUR CROSS CUT FISSURE 1.6MM (BURR) ×1
BUR EGG ELITE 4.0 (BURR) ×2 IMPLANT
BUR EGG ELITE 4.0MM (BURR) ×1
CANISTER SUCT 3000ML PPV (MISCELLANEOUS) ×3 IMPLANT
COVER SURGICAL LIGHT HANDLE (MISCELLANEOUS) ×3 IMPLANT
COVER WAND RF STERILE (DRAPES) IMPLANT
CRADLE DONUT ADULT HEAD (MISCELLANEOUS) ×3 IMPLANT
DECANTER SPIKE VIAL GLASS SM (MISCELLANEOUS) IMPLANT
DRAPE U-SHAPE 76X120 STRL (DRAPES) ×3 IMPLANT
GAUZE PACKING FOLDED 2  STR (GAUZE/BANDAGES/DRESSINGS) ×2
GAUZE PACKING FOLDED 2 STR (GAUZE/BANDAGES/DRESSINGS) ×1 IMPLANT
GLOVE BIO SURGEON STRL SZ 6.5 (GLOVE) ×2 IMPLANT
GLOVE BIO SURGEON STRL SZ7.5 (GLOVE) ×3 IMPLANT
GLOVE BIO SURGEONS STRL SZ 6.5 (GLOVE) ×1
GLOVE BIOGEL PI IND STRL 6.5 (GLOVE) ×1 IMPLANT
GLOVE BIOGEL PI IND STRL 7.0 (GLOVE) ×1 IMPLANT
GLOVE BIOGEL PI INDICATOR 6.5 (GLOVE) ×2
GLOVE BIOGEL PI INDICATOR 7.0 (GLOVE) ×2
GOWN STRL REUS W/ TWL LRG LVL3 (GOWN DISPOSABLE) ×1 IMPLANT
GOWN STRL REUS W/ TWL XL LVL3 (GOWN DISPOSABLE) ×1 IMPLANT
GOWN STRL REUS W/TWL LRG LVL3 (GOWN DISPOSABLE) ×2
GOWN STRL REUS W/TWL XL LVL3 (GOWN DISPOSABLE) ×2
IV NS 1000ML (IV SOLUTION) ×2
IV NS 1000ML BAXH (IV SOLUTION) ×1 IMPLANT
KIT BASIN OR (CUSTOM PROCEDURE TRAY) ×3 IMPLANT
KIT TURNOVER KIT B (KITS) ×3 IMPLANT
NEEDLE 22X1 1/2 (OR ONLY) (NEEDLE) ×6 IMPLANT
NEEDLE 27GAX1X1/2 (NEEDLE) IMPLANT
NS IRRIG 1000ML POUR BTL (IV SOLUTION) ×3 IMPLANT
PAD ARMBOARD 7.5X6 YLW CONV (MISCELLANEOUS) ×3 IMPLANT
SLEEVE IRRIGATION ELITE 7 (MISCELLANEOUS) ×3 IMPLANT
SUT CHROMIC 3 0 PS 2 (SUTURE) ×6 IMPLANT
SYR CONTROL 10ML LL (SYRINGE) ×3 IMPLANT
TRAY ENT MC OR (CUSTOM PROCEDURE TRAY) ×3 IMPLANT
TUBING IRRIGATION (MISCELLANEOUS) ×3 IMPLANT
YANKAUER SUCT BULB TIP NO VENT (SUCTIONS) ×3 IMPLANT

## 2018-03-13 NOTE — Op Note (Signed)
03/13/2018  10:38 AM  PATIENT:  Brandon [name]  53 y.o. male  PRE-OPERATIVE DIAGNOSIS:  NON RESTORABLE TEETH # 2, 13, 31, IMPACTED TEETH # 1, 17, 18, 32, BUCCAL EXOSTOSIS LEFT MAXILLA  POST-OPERATIVE DIAGNOSIS:  SAME+ CYST RIGHT AND LEFT MANDIBLE  PROCEDURE:  Procedure(s):  EXTRACTION TEETH # 1, 2, 13, 17, 18, 31, 32; REMOVAL BUCCAL EXOSTOSIS LEFT MAXILLA    SURGEON:  Surgeon(s): Diona Browner, DDS  ANESTHESIA:   local and general  EBL:  minimal  DRAINS: none   SPECIMEN: 1. CYST RIGHT MANDIBLE 2. CYST LEFT MANDIBLE  PRELIMINARY DIAGNOSIS DENTIGEROUS CYST X 2  COUNTS:  YES  PLAN OF CARE: Discharge to home after PACU  PATIENT DISPOSITION:  PACU - hemodynamically stable.   PROCEDURE DETAILS: Dictation # [id number]  Gae Bon, DMD 03/13/2018 10:38 AM

## 2018-03-13 NOTE — Anesthesia Postprocedure Evaluation (Signed)
Anesthesia Post Note  Patient: Brandon [name]  Procedure(s) Performed: MULTIPLE EXTRACTION (N/A )     Patient location during evaluation: PACU Anesthesia Type: General Level of consciousness: awake and alert Pain management: pain level controlled Vital Signs Assessment: post-procedure vital signs reviewed and stable Respiratory status: spontaneous breathing, nonlabored ventilation, respiratory function stable and patient connected to nasal cannula oxygen Cardiovascular status: blood pressure returned to baseline and stable Postop Assessment: no apparent nausea or vomiting Anesthetic complications: no    Last Vitals:  Vitals:   03/13/18 1230 03/13/18 1245  BP: (!) 160/104   Pulse: 94   Resp: 15   Temp:  36.5 C  SpO2: 94%     Last Pain:  Vitals:   03/13/18 1245  TempSrc:   PainSc: [place]

## 2018-03-13 NOTE — Transfer of Care (Signed)
Immediate Anesthesia Transfer of Care Note  Patient: Brandon [name]  Procedure(s) Performed: MULTIPLE EXTRACTION (N/A )  Patient Location: PACU  Anesthesia Type:General  Level of Consciousness: awake, alert , oriented and patient cooperative  Airway & Oxygen Therapy: Patient Spontanous Breathing and Patient connected to nasal cannula oxygen  Post-op Assessment: Report given to RN and Post -op Vital signs reviewed and stable  Post vital signs: Reviewed and stable  Last Vitals:  Vitals Value Taken Time  BP    Temp    Pulse 97 03/13/2018 10:58 AM  Resp    SpO2 99 % 03/13/2018 10:58 AM  Vitals shown include unvalidated device data.  Last Pain:  Vitals:   03/13/18 0700  TempSrc: Oral         Complications: No apparent anesthesia complications

## 2018-03-13 NOTE — Anesthesia Procedure Notes (Addendum)
Procedure Name: Intubation Date/Time: 03/13/2018 9:46 AM Performed by: Raenette Rover, CRNA Pre-anesthesia Checklist: Patient identified, Emergency Drugs available, Suction available and Patient being monitored Patient Re-evaluated:Patient Re-evaluated prior to induction Oxygen Delivery Method: Circle system utilized Preoxygenation: Pre-oxygenation with 100% oxygen Induction Type: IV induction Ventilation: Mask ventilation without difficulty Laryngoscope Size: Miller and 2 Grade View: Grade I Tube type: Oral Rae Tube size: 7.5 mm Number of attempts: 1 Airway Equipment and Method: Stylet Placement Confirmation: ETT inserted through vocal cords under direct vision,  positive ETCO2,  CO2 detector and breath sounds checked- equal and bilateral Secured at: 21 cm Tube secured with: Tape Dental Injury: Teeth and Oropharynx as per pre-operative assessment

## 2018-03-13 NOTE — Op Note (Signed)
NAME: DENARD, TUMINELLO MEDICAL RECORD RK:[id number] ACCOUNT [id number] DATE OF BIRTH:09-26-1965 FACILITY: [facility] LOCATION: [facility] PHYSICIAN:Shriyans Kuenzi M. Renetta Suman, DDS  OPERATIVE REPORT  DATE OF PROCEDURE:  03/13/2018  PREOPERATIVE DIAGNOSIS:  Nonrestorable teeth secondary to dental caries 2, 13, 31.  Impacted teeth 1, 17, 18, 32, buccal exostosis, left maxilla.  POSTOPERATIVE DIAGNOSIS:  Nonrestorable teeth secondary to dental caries 2, 13, 31.  Impacted teeth 1, 17, 18, 32, buccal exostosis, left maxilla, cyst, right and left mandible.  PROCEDURE:  Extraction of teeth numbers 1, 2, 13, 17, 18, 31, 32, removal left maxillary buccal exostosis, removal of cyst, right and left mandible.  SURGEON:  Diona Browner, DDS  ANESTHESIA:  General.  ATTENDING:  [place], nasal intubation.  PROCEDURE:  The patient was taken to the operating room and placed on the table in supine position.  General anesthesia was administered intravenously oral endotracheal tube was placed and marked.  The eyes were protected and the patient was draped for  surgery.  A timeout was performed.  The posterior pharynx was suctioned and a throat pack was placed.  Then, 0.25% Marcaine 1:200,000 epinephrine was infiltrated in an inferior alveolar block on the right and left sides and buccal and palatal  infiltration in the maxilla around the teeth to be removed.  A total of 17 mL was utilized.  A bite block was placed in the right side of the mouth.  Sweetheart retractor was used to retract the tongue.  A #15 blade used to make an incision overlying  tooth 17 and 18 with a sulcular incision buccally to tooth 20 where the papilla was released.  Then, the periosteum was reflected with a periosteal elevator.  Bone was removed to expose impacted teeth 17 and 18.  Tooth 17 was extracted first by removing  overlying bone and circumferential bone, sectioning the tooth removing the crown in portions.  Then, the crown of tooth 18 was  removed and then the roots of tooth 17 were removed with a Stryker handpiece and 301 elevator and the roots of tooth 18 were  removed.  There was a lesion associated with these impacted teeth and this lesion was sent for pathology routine.  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  Then, the area was irrigated, curetted closed with 3-0 chromic.  In the left maxilla, a #15 blade was  used to make an incision at the posterior maxillary alveolar crest, carried forward to tooth 11 and reflected.  A large buccal exostosis present in the posterior maxilla.  This was reduced using a rongeur and bone file.  Then, tooth 13 was retained  root.  Bone was removed and then the tooth was removed with a rongeur.  Then, the area was irrigated and closed with 3-0 chromic.  The bite block and sweetheart retractor were repositioned to the other side of the mouth.  A #15 blade was used to make an  incision at tooth 31 and 32 and 1 and 2.  The periosteum was reflected from around these teeth.  Tooth 31 was removed with the cow horn forceps.  Tooth 32 was removed using a Stryker handpiece to remove bone and section the tooth into multiple pieces and  then the tooth was removed with a 301 elevator.  Then, the site was curetted, irrigated and closed with 3-0 chromic.  Tooth 2 was removed with an upper Universal forceps.  Tooth 1 was removed with a 301 elevator after removing bone.  Then, this area was  closed with 3-0 chromic.  The oral cavity was then irrigated and suctioned.  The throat pack was removed.  The patient was awakened and left in the care of Anesthesia for transport to recovery room with plans for discharge home through day surgery.  ESTIMATED BLOOD LOSS:  Minimal.  COMPLICATIONS:  None.  SPECIMENS:  Cystic lesion, right and left mandible.    PRELIMINARY DIAGNOSIS:  Dentigerous cyst x2.  TN/NUANCE  D:03/13/2018 T:03/13/2018 JOB:[id number]

## 2018-03-13 NOTE — H&P (Signed)
HISTORY AND PHYSICAL  Brandon [name] is a 53 y.o. male patient with CC: General dentist referral for removal of multiple non-restorable and impacted teeth.  No diagnosis found.  Past Medical History:  Diagnosis Date  . Abdominal pain   . Anemia   . Arthritis   . Back pain 02/13/2016  . Constipation   . Depression   . Diarrhea   . Foot lesion 12/19/2014  . Gallstones   . Gastric AVM   . GERD (gastroesophageal reflux disease)   . GI bleed   . HIV (human immunodeficiency virus infection) ([place])   . Hypertension   . IBS (irritable bowel syndrome)   . Infectious colitis   . Interstitial cystitis   . Mechanical heart valve present   . Nausea & vomiting   . Pancreatitis   . 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 Stroke ([place])   . Weight loss, unintentional     Current Facility-Administered Medications  Medication Dose Route Frequency Provider Last Rate Last Dose  . clindamycin (CLEOCIN) IVPB 600 mg  600 mg Intravenous Once Diona Browner, DDS      . lactated ringers infusion   Intravenous Continuous Albertha Ghee, MD       Facility-Administered Medications Ordered in Other Encounters  Medication Dose Route Frequency Provider Last Rate Last Dose  . 0.9 %  sodium chloride infusion   Intravenous Once Campbell Riches, MD       Allergies  Allergen Reactions  . Bactrim [Sulfamethoxazole-Trimethoprim]   . Bee Venom Anaphylaxis  . Sulfa Antibiotics Anaphylaxis  . Truvada [Emtricitabine-Tenofovir Df] Anaphylaxis and Rash    Takes plain tenofovir at home  . Lidoderm [Lidocaine] Other (See Comments)    Reaction unknown  . Raltegravir     resistance  . Ceftriaxone Rash  . Sulfamethoxazole Itching, Other (See Comments) and Rash    Other reaction(s): Hypotension (ALLERGY/intolerance)   Active Problems:   * No active hospital problems. *  Vitals: Blood pressure (!) 154/92, pulse 87, temperature 98.2 F (36.8 C), temperature source Oral, SpO2 100 %. Lab  results: Results for orders placed or performed during the hospital encounter of 03/13/18 (from the past 24 hour(s))  CBC     Status: Abnormal   Collection Time: 03/13/18  7:28 AM  Result Value Ref Range   WBC 4.7 4.0 - 10.5 K/uL   RBC 3.18 (L) 4.22 - 5.81 MIL/uL   Hemoglobin 12.4 (L) 13.0 - 17.0 g/dL   HCT 36.5 (L) 39.0 - 52.0 %   MCV 114.8 (H) 80.0 - 100.0 fL   MCH 39.0 (H) 26.0 - 34.0 pg   MCHC 34.0 30.0 - 36.0 g/dL   RDW 12.1 11.5 - 15.5 %   Platelets 187 150 - 400 K/uL   nRBC 0.0 0.0 - 0.2 %  Basic metabolic panel     Status: Abnormal   Collection Time: 03/13/18  7:28 AM  Result Value Ref Range   Sodium 138 135 - 145 mmol/L   Potassium 4.0 3.5 - 5.1 mmol/L   Chloride 107 98 - 111 mmol/L   CO2 21 (L) 22 - 32 mmol/L   Glucose, Bld 96 70 - 99 mg/dL   BUN 19 6 - 20 mg/dL   Creatinine, Ser 1.40 (H) 0.61 - 1.24 mg/dL   Calcium 8.9 8.9 - 10.3 mg/dL   GFR calc non Af Amer 58 (L) >60 mL/min   GFR calc Af Amer >60 >60 mL/min   Anion gap 10 5 - 15  Protime-INR     Status: None   Collection Time: 03/13/18  7:28 AM  Result Value Ref Range   Prothrombin Time 13.5 11.4 - 15.2 seconds   INR 1.04    Radiology Results: No results found. General appearance: alert, cooperative and no distress Head: Normocephalic, without obvious abnormality, atraumatic Eyes: negative Nose: Nares normal. Septum midline. Mucosa normal. No drainage or sinus tenderness. Throat: lips, mucosa, and tongue normal; teeth and gums normal and Multiple carious teeth, exostosis left maxilla. pharynx clear Neck: no adenopathy, supple, symmetrical, trachea midline and thyroid not enlarged, symmetric, no tenderness/mass/nodules Resp: clear to auscultation bilaterally Cardio: regular rate and rhythm, S1, S2 normal, no murmur, click, rub or gallop  Assessment: 53 yo M s/p AVR 1997, HIV, GERD, CVA, HTN,  With Multiple nonrestorable/impacted teeth, exostosis left maxilla.  Plan: Multiple dental extractions, removal  maxillary exostosis. GA. Day surgery.    Diona Browner 03/13/2018

## 2018-03-14 ENCOUNTER — Encounter (HOSPITAL_COMMUNITY): Payer: Self-pay | Admitting: Oral Surgery

## 2018-03-19 ENCOUNTER — Telehealth: Payer: Self-pay

## 2018-03-19 NOTE — Telephone Encounter (Signed)
Patient walk-in for tempeture check. Patient states he recently had a tooth pulled recently and is currently on antibiotics. Temp is currently 98.3. Patient advised to continue to take antibiotics and increase fluid intake. Patient agrees and understands. Patient reminded of upcoming appointment with Dr. Tommy Medal.

## 2018-03-19 NOTE — Telephone Encounter (Signed)
Agree 

## 2018-03-19 NOTE — Telephone Encounter (Signed)
Filled patient's pill box today. 

## 2018-04-02 ENCOUNTER — Telehealth: Payer: Self-pay | Admitting: Gastroenterology

## 2018-04-02 NOTE — Telephone Encounter (Signed)
Patient's wife notified that results were sent via [place]. I reviewed the results all questions answered.

## 2018-04-02 NOTE — Telephone Encounter (Signed)
Pt wife called in wanting to know the results from husbands colon that was done on 03/09/2018

## 2018-04-08 MED FILL — ZIDOVUDINE 300 MG TABLET: 300 | 30 days supply | Qty: 60 | Fill #3

## 2018-04-08 MED FILL — RITONAVIR 100 MG TABS: 100 | 30 days supply | Qty: 60 | Fill #3

## 2018-04-08 MED FILL — PREZISTA 600 MG TABS: 600 | 30 days supply | Qty: 60 | Fill #3

## 2018-04-08 MED FILL — TIVICAY 50 MG TABLET: 50 | 30 days supply | Qty: 60 | Fill #3

## 2018-04-08 MED FILL — DESCOVY 200-25 MG TABS: 200-25 | 30 days supply | Qty: 30 | Fill #3

## 2018-04-16 ENCOUNTER — Other Ambulatory Visit: Payer: Self-pay | Admitting: Pharmacist

## 2018-04-16 NOTE — Progress Notes (Signed)
Filled patient's pill box.

## 2018-05-05 MED FILL — TIVICAY 50 MG TABLET: 50 | 30 days supply | Qty: 60 | Fill #4

## 2018-05-05 MED FILL — RITONAVIR 100 MG TABS: 100 | 30 days supply | Qty: 60 | Fill #4

## 2018-05-05 MED FILL — PREZISTA 600 MG TABS: 600 | 30 days supply | Qty: 60 | Fill #4

## 2018-05-05 MED FILL — DESCOVY 200-25 MG TABS: 200-25 | 30 days supply | Qty: 30 | Fill #4

## 2018-05-05 MED FILL — ZIDOVUDINE 300 MG TABLET: 300 | 30 days supply | Qty: 60 | Fill #4

## 2018-05-14 ENCOUNTER — Other Ambulatory Visit: Payer: Self-pay

## 2018-05-14 ENCOUNTER — Other Ambulatory Visit: Payer: Self-pay | Admitting: Pharmacist

## 2018-05-14 ENCOUNTER — Other Ambulatory Visit: Payer: Medicaid Other

## 2018-05-14 ENCOUNTER — Other Ambulatory Visit (HOSPITAL_COMMUNITY)
Admission: RE | Admit: 2018-05-14 | Discharge: 2018-05-14 | Disposition: A | Discharge status: Home / Self Care | Payer: Medicaid Other | Source: Ambulatory Visit | Attending: Infectious Disease | Admitting: Infectious Disease

## 2018-05-14 DIAGNOSIS — Z113 Encounter for screening for infections with a predominantly sexual mode of transmission: Secondary | ICD-10-CM | POA: Insufficient documentation

## 2018-05-14 DIAGNOSIS — B20 Human immunodeficiency virus [HIV] disease: Secondary | ICD-10-CM

## 2018-05-14 NOTE — Progress Notes (Signed)
Filled patient's pill box today. 

## 2018-05-15 LAB — T-HELPER CELLS (CD4) COUNT (NOT AT ARMC)
CD4 % Helper T Cell: 19 % — ABNORMAL LOW (ref 33–55)
CD4 T CELL ABS: 330 /uL — AB (ref 400–2700)

## 2018-05-15 LAB — URINE CYTOLOGY ANCILLARY ONLY
Chlamydia: NEGATIVE
Neisseria Gonorrhea: NEGATIVE

## 2018-05-18 LAB — CBC WITH DIFFERENTIAL/PLATELET
Absolute Monocytes: 238 cells/uL (ref 200–950)
BASOS ABS: 20 {cells}/uL (ref 0–200)
Basophils Relative: 0.6 %
Eosinophils Absolute: 50 cells/uL (ref 15–500)
Eosinophils Relative: 1.5 %
HCT: 36.7 % — ABNORMAL LOW (ref 38.5–50.0)
Hemoglobin: 13.1 g/dL — ABNORMAL LOW (ref 13.2–17.1)
Lymphs Abs: 1878 cells/uL (ref 850–3900)
MCH: 38.5 pg — ABNORMAL HIGH (ref 27.0–33.0)
MCHC: 35.7 g/dL (ref 32.0–36.0)
MCV: 107.9 fL — ABNORMAL HIGH (ref 80.0–100.0)
MPV: 11.8 fL (ref 7.5–12.5)
Monocytes Relative: 7.2 %
Neutro Abs: 1115 cells/uL — ABNORMAL LOW (ref 1500–7800)
Neutrophils Relative %: 33.8 %
Platelets: 148 10*3/uL (ref 140–400)
RBC: 3.4 10*6/uL — ABNORMAL LOW (ref 4.20–5.80)
RDW: 12.9 % (ref 11.0–15.0)
TOTAL LYMPHOCYTE: 56.9 %
WBC: 3.3 10*3/uL — ABNORMAL LOW (ref 3.8–10.8)

## 2018-05-18 LAB — COMPREHENSIVE METABOLIC PANEL
AG Ratio: 1.6 (calc) (ref 1.0–2.5)
ALT: 6 U/L — AB (ref 9–46)
AST: 14 U/L (ref 10–35)
Albumin: 4.2 g/dL (ref 3.6–5.1)
Alkaline phosphatase (APISO): 97 U/L (ref 35–144)
BUN/Creatinine Ratio: 9 (calc) (ref 6–22)
BUN: 12 mg/dL (ref 7–25)
CO2: 29 mmol/L (ref 20–32)
Calcium: 9.5 mg/dL (ref 8.6–10.3)
Chloride: 106 mmol/L (ref 98–110)
Creat: 1.34 mg/dL — ABNORMAL HIGH (ref 0.70–1.33)
Globulin: 2.6 g/dL (calc) (ref 1.9–3.7)
Glucose, Bld: 128 mg/dL — ABNORMAL HIGH (ref 65–99)
Potassium: 4 mmol/L (ref 3.5–5.3)
Sodium: 139 mmol/L (ref 135–146)
Total Bilirubin: 0.7 mg/dL (ref 0.2–1.2)
Total Protein: 6.8 g/dL (ref 6.1–8.1)

## 2018-05-18 LAB — HIV-1 RNA QUANT-NO REFLEX-BLD
HIV 1 RNA Quant: <20 copies/mL
HIV-1 RNA Quant, Log: <1.3 Log copies/mL

## 2018-05-29 MED FILL — DESCOVY 200-25 MG TABS: 200-25 | 30 days supply | Qty: 30 | Fill #5

## 2018-05-29 MED FILL — ZIDOVUDINE 300 MG TABLET: 300 | 30 days supply | Qty: 60 | Fill #5

## 2018-05-29 MED FILL — TIVICAY 50 MG TABLET: 50 | 30 days supply | Qty: 60 | Fill #5

## 2018-05-29 MED FILL — RITONAVIR 100 MG TABS: 100 | 30 days supply | Qty: 60 | Fill #5

## 2018-05-29 MED FILL — PREZISTA 600 MG TABS: 600 | 30 days supply | Qty: 60 | Fill #5

## 2018-06-02 ENCOUNTER — Telehealth (INDEPENDENT_AMBULATORY_CARE_PROVIDER_SITE_OTHER): Payer: Medicaid Other | Admitting: Infectious Disease

## 2018-06-02 ENCOUNTER — Other Ambulatory Visit: Payer: Self-pay | Admitting: Pharmacist

## 2018-06-02 ENCOUNTER — Encounter: Payer: Self-pay | Admitting: Infectious Disease

## 2018-06-02 DIAGNOSIS — B20 Human immunodeficiency virus [HIV] disease: Secondary | ICD-10-CM

## 2018-06-02 DIAGNOSIS — R1115 Cyclical vomiting syndrome unrelated to migraine: Secondary | ICD-10-CM

## 2018-06-02 DIAGNOSIS — R112 Nausea with vomiting, unspecified: Secondary | ICD-10-CM

## 2018-06-02 DIAGNOSIS — M8580 Other specified disorders of bone density and structure, unspecified site: Secondary | ICD-10-CM

## 2018-06-02 DIAGNOSIS — Z952 Presence of prosthetic heart valve: Secondary | ICD-10-CM

## 2018-06-02 DIAGNOSIS — Z7901 Long term (current) use of anticoagulants: Secondary | ICD-10-CM

## 2018-06-02 HISTORY — DX: Other specified disorders of bone density and structure, unspecified site: M85.80

## 2018-06-02 MED ORDER — ZIDOVUDINE 300 MG PO TABS: 300.0000 mg | ORAL_TABLET | Freq: Two times a day (BID) | ORAL | 11 refills | Status: DC

## 2018-06-02 MED ORDER — DOLUTEGRAVIR SODIUM 50 MG PO TABS: 50.0000 mg | ORAL_TABLET | Freq: Two times a day (BID) | ORAL | 11 refills | Status: DC

## 2018-06-02 MED ORDER — RITONAVIR 100 MG PO TABS: 100.0000 mg | ORAL_TABLET | Freq: Two times a day (BID) | ORAL | 11 refills | Status: DC

## 2018-06-02 MED ORDER — DARUNAVIR ETHANOLATE 600 MG PO TABS: 600.0000 mg | ORAL_TABLET | Freq: Two times a day (BID) | ORAL | 11 refills | Status: DC

## 2018-06-02 MED ORDER — EMTRICITABINE-TENOFOVIR AF 200-25 MG PO TABS: 1.0000 | ORAL_TABLET | Freq: Every day | ORAL | 11 refills | Status: DC

## 2018-06-02 NOTE — Progress Notes (Signed)
Refill

## 2018-06-02 NOTE — Progress Notes (Signed)
Virtual Visit via Telephone Note  I connected with Brandon [name] on 06/02/18 at  3:45 PM EDT by telephone and verified that I am speaking with the correct person using two identifiers.   I discussed the limitations, risks, security and privacy concerns of performing an evaluation and management service by telephone and the availability of in person appointments. I also discussed with the patient that there may be a patient responsible charge related to this service. The patient expressed understanding and agreed to proceed.   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  His viral load remains undetectable less than 20 and his CD4 count is healthy.  We talked over the phone today he had several concerns:  1.  He is concerned that it seems when he bumps into objects that he has an extraordinary amount of pain.  For example he cites an example where he bumped into his dogs metallic dog collar with his fingers and this caused excruciating pain.  He asked me to review studies from [facility] where his primary care physician had done a DEXA scan.  This did show osteopenia and I asked him to follow-up with a primary care physician with regards to management.  2.  He continues to have trouble with vomiting that happens as much as once a week.  This was worked up before by GI and there was consideration for the possibility of this being due to cyclic nausea and vomiting in the context of marijuana.  I think Brandon [name] is also correct though that this is likely related to his antivirals in particular his protease inhibitor therapy.  Unfortunately I do not feel that is a good idea to try to tinker with his regimen which is kept him so well suppressed given his multidrug-resistant  virus.     Observations/Objective:  Brandon [name] is doing very well in terms of managing his HIV.  His viral load remains undetectable we reviewed this lab along with a CD4 count that is healthy.  His chemistries show stable kidney function and normal  liver function tests  He has some macrocytosis due to him taking ACT.  A little bit of anemia.  Currently from a symptom standpoint he seems to be stable although he really is very much interested in being on a new antiviral regimen I do not think there is a alternative regimen that would work sufficiently well for him.    Assessment and Plan:  #1 HIV disease: Continue current regimen: We will contemplate use of fostemsavir though I am not as confident in this drug as a PI perhaps in conjuctiong with DTG and his NRTI's it could do the job.   IF and when Islatravir becomes available that would also be a Higher education careers adviser for his ARV regimen  #2  Osteopenia he should follow-up with primary care for it.  #3 Nausea: Says this is more pronounced in the morning and feels that it is more of a problem when he is dehydrated.  Then I do not want to reconfigure his antiretroviral regimen to we have newer potent agents. 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  He says he has been following up with his primary  care physician.     Follow Up Instructions:    I discussed the assessment and treatment plan with the patient. The patient was provided an opportunity to ask questions and all were answered. The patient agreed with the plan and demonstrated an understanding of the instructions.   The patient was advised to call back or seek an in-person evaluation if the symptoms worsen or if the condition fails to improve as anticipated.  I provided 33 minutes of non-face-to-face time during this encounter.   Brandon Evener, MD

## 2018-06-11 ENCOUNTER — Other Ambulatory Visit: Payer: Self-pay | Admitting: Pharmacist

## 2018-06-11 NOTE — Progress Notes (Signed)
Filled patient's pill box for 6 weeks.

## 2018-06-28 MED FILL — ZIDOVUDINE 300 MG TABLET: 300 | 30 days supply | Qty: 60 | Fill #0

## 2018-06-28 MED FILL — RITONAVIR 100 MG TABS: 100 | 30 days supply | Qty: 60 | Fill #0

## 2018-06-28 MED FILL — DESCOVY 200-25 MG TABS: 200-25 | 30 days supply | Qty: 30 | Fill #0

## 2018-06-28 MED FILL — TIVICAY 50 MG TABLET: 50 | 30 days supply | Qty: 60 | Fill #0

## 2018-06-28 MED FILL — PREZISTA 600 MG TABS: 600 | 30 days supply | Qty: 60 | Fill #0

## 2018-07-22 ENCOUNTER — Other Ambulatory Visit: Payer: Self-pay | Admitting: Pharmacist

## 2018-07-22 NOTE — Progress Notes (Signed)
Filled patient's pill box today for 4 weeks.

## 2018-07-29 MED FILL — RITONAVIR 100 MG TABS: 100 | 30 days supply | Qty: 60 | Fill #1

## 2018-07-29 MED FILL — ZIDOVUDINE 300 MG TABLET: 300 | 30 days supply | Qty: 60 | Fill #1

## 2018-07-29 MED FILL — PREZISTA 600 MG TABS: 600 | 30 days supply | Qty: 60 | Fill #1

## 2018-07-29 MED FILL — DESCOVY 200-25 MG TABS: 200-25 | 30 days supply | Qty: 30 | Fill #1

## 2018-07-29 MED FILL — TIVICAY 50 MG TABLET: 50 | 30 days supply | Qty: 60 | Fill #1

## 2018-08-04 ENCOUNTER — Telehealth: Payer: Self-pay | Admitting: Pharmacy Technician

## 2018-08-04 NOTE — Telephone Encounter (Signed)
Patient's medications have been couriered to [facility] from [facility] and are ready for pick up.  Brandon [name] calls regularly each month giving a day's notice to clinic pharmacist for dropping off his medication pill boxes for refilling.  Brandon [name]. Brandon [name] [place] Patient [facility] for Infectious Disease Phone: [phone] Fax:  [phone]

## 2018-08-19 ENCOUNTER — Other Ambulatory Visit: Payer: Self-pay | Admitting: Pharmacist

## 2018-08-19 ENCOUNTER — Telehealth: Payer: Self-pay | Admitting: Pharmacy Technician

## 2018-08-19 NOTE — Telephone Encounter (Signed)
Brandon [name] dropped off his medication box for filling today.  Brandon [name]. Brandon [name] [name] for Infectious Disease Phone: [phone] Fax:  [phone]

## 2018-08-19 NOTE — Progress Notes (Signed)
Filled patient's pill box today.

## 2018-08-28 MED FILL — DESCOVY 200-25 MG TABS: 200-25 | 30 days supply | Qty: 30 | Fill #2

## 2018-08-28 MED FILL — RITONAVIR 100 MG TAB: 100 | 30 days supply | Qty: 60 | Fill #2

## 2018-08-28 MED FILL — ZIDOVUDINE 300 MG TABLET: 300 | 30 days supply | Qty: 60 | Fill #2

## 2018-08-28 MED FILL — PREZISTA 600 MG TABS: 600 | 30 days supply | Qty: 60 | Fill #2

## 2018-08-28 MED FILL — TIVICAY 50 MG TABLET: 50 | 30 days supply | Qty: 60 | Fill #2

## 2018-08-31 ENCOUNTER — Telehealth: Payer: Self-pay | Admitting: Pharmacy Technician

## 2018-08-31 NOTE — Telephone Encounter (Signed)
[facility] Patient Advocate Encounter  Medications have been couriered from [place] to [facility] clinic.  Patient will meet with the pharmacist at clinic to fill pill boxes and calls monthly before coming here.  Brandon [name]. Brandon [name] Patient [facility] for Infectious Disease Phone: [phone] Fax:  [phone]

## 2018-09-16 ENCOUNTER — Telehealth: Payer: Self-pay | Admitting: Pharmacy Technician

## 2018-09-16 NOTE — Telephone Encounter (Addendum)
[facility] Patient Advocate Encounter  Patient came into the clinic today to pick up medications sent via courier from [facility]. He misunderstood thinking Cassie would be here until Thursday and dropped off his medication bag this morning to be filled. He returned later this afternoon and I gave him the bag of medications and his medication bag. I verified name and address on the prescription bag before giving to the patient.   He stated that his new job with [employer] has him working 8:00am-5:00pm and he arrives to work at [employer] and will not be able to get to the clinic during our hours of operation. His wife is unable to bring the medication back and forth and wanted to speak to someone about other options for getting his medications. He can be reached during the day at [phone]. He uses [facility] for regular medications and they have home delivery service but would be looking into alternative options for his medication due to new work hours. Patient stated he would be calling to get this arranged.

## 2018-09-21 NOTE — Telephone Encounter (Signed)
That is ok.Kris Mouton might also be an interesting person to try to swap in fostemsavir for given his long term GI symptoms that he attributes to DRV/RTV BID. We can discuss next appt since I am sure not on many formularies yet

## 2018-09-21 NOTE — Telephone Encounter (Signed)
FYI - Patient has requested to fill at another pharmacy and will no longer get his pill box filled from me.

## 2018-09-21 NOTE — Telephone Encounter (Signed)
That sounds good. I checked with [facility] last week and it wasn't available to order yet, but I will keep checking. I'm sure they will have an assistance program before insurance companies get on board!

## 2018-09-22 NOTE — Telephone Encounter (Signed)
Thanks so much Kassie!

## 2018-11-20 DIAGNOSIS — R1115 Cyclical vomiting syndrome unrelated to migraine: Secondary | ICD-10-CM

## 2018-11-23 ENCOUNTER — Other Ambulatory Visit: Payer: Self-pay | Admitting: *Deleted

## 2018-11-23 DIAGNOSIS — R1115 Cyclical vomiting syndrome unrelated to migraine: Secondary | ICD-10-CM

## 2018-11-23 MED ORDER — ONDANSETRON 8 MG PO TBDP: 8.0000 mg | ORAL_TABLET | Freq: Three times a day (TID) | ORAL | 6 refills | Status: DC | PRN

## 2018-12-16 NOTE — Telephone Encounter (Signed)
I think this would definitely be a good option for him.

## 2018-12-17 NOTE — Telephone Encounter (Signed)
He's coming to see you Monday 10/19

## 2018-12-17 NOTE — Telephone Encounter (Signed)
Perfect.  I will get him scheduled ASAP.

## 2018-12-28 ENCOUNTER — Telehealth: Payer: Self-pay | Admitting: *Deleted

## 2018-12-28 ENCOUNTER — Other Ambulatory Visit: Payer: Self-pay

## 2018-12-28 ENCOUNTER — Ambulatory Visit (INDEPENDENT_AMBULATORY_CARE_PROVIDER_SITE_OTHER): Payer: Medicaid Other | Admitting: Infectious Disease

## 2018-12-28 ENCOUNTER — Encounter: Payer: Self-pay | Admitting: Infectious Disease

## 2018-12-28 VITALS — BP 164/90 | HR 75 | Temp 97.8°F | Wt 127.0 lb

## 2018-12-28 DIAGNOSIS — Z952 Presence of prosthetic heart valve: Secondary | ICD-10-CM

## 2018-12-28 DIAGNOSIS — R1115 Cyclical vomiting syndrome unrelated to migraine: Secondary | ICD-10-CM

## 2018-12-28 DIAGNOSIS — R1013 Epigastric pain: Secondary | ICD-10-CM | POA: Diagnosis not present

## 2018-12-28 DIAGNOSIS — R112 Nausea with vomiting, unspecified: Secondary | ICD-10-CM

## 2018-12-28 DIAGNOSIS — B20 Human immunodeficiency virus [HIV] disease: Secondary | ICD-10-CM

## 2018-12-28 MED ORDER — FOSTEMSAVIR TROMETHAMINE ER 600 MG PO TB12: 1.0000 | ORAL_TABLET | Freq: Two times a day (BID) | ORAL | 5 refills | Status: DC

## 2018-12-28 MED ORDER — ZIDOVUDINE 300 MG PO TABS: 300.0000 mg | ORAL_TABLET | Freq: Two times a day (BID) | ORAL | 5 refills | Status: DC

## 2018-12-28 MED ORDER — DESCOVY 200-25 MG PO TABS: 1.0000 | ORAL_TABLET | Freq: Every day | ORAL | 5 refills | Status: DC

## 2018-12-28 MED ORDER — TIVICAY 50 MG PO TABS: 50.0000 mg | ORAL_TABLET | Freq: Two times a day (BID) | ORAL | 5 refills | Status: DC

## 2018-12-28 NOTE — Telephone Encounter (Signed)
Received call from pharmacy stating the Brandon [name] will require prior authorization. Pharmacy is sending the request now. Landis Gandy, RN

## 2018-12-28 NOTE — Telephone Encounter (Signed)
Does not surprise me. Well we will have to see when approved

## 2018-12-28 NOTE — Progress Notes (Signed)
Chief complaint: Suffering from nausea from regimen and had to miss 3 days of it this past weekend, and wants to be on a regimen without Prezista and Norvir  Subjective:    Patient ID: Brandon [name], male    DOB: 09-07-1965, 53 y.o.   MRN: [id number]  HPI  Brandon [name] is a highly complicated man with history of  HIV/AIDS and Multi-DRUG RESISTANT virus formerly followed at [facility]. His HIV nadir was  20 when we first met him   He had been on various complicated antiretroviral regimens in the past, with unfortunate GENOTYPIC resistance to all non-nucleoside reverse transcriptase inhibitors and all NRTIs, Resistance to all protease inhibitors with the exception of Prezista which had some activity genotypically,, Resistance to Isentress, and Elvitegravir and  reduced S to dolutegravir having both a 148H and 140S  and with Dual tropic virus.  02/12/2010 phenotype at [facility] showed:  RT: NRTI: ABC, DDI, D4T, AZT, TDF: resistant; 3TC, FTC: susceptible; NNRTI: EFV susceptible; RPV, NVP, ETR, DLV: Resistant; PI: pan-resistant  He had decided nto go back onto [facility] and WAS  referred to [facility].  We  Had  seen him and placed him on a  salvage regimen of Prezista 614m  Twice daily boosted with Norvir 1050mtwice daily, Tivicay twice daily, Combivir twice daily and once daily Viread.  And since then he haD BEEN WITH AN UNDETECTABLE VIRAL LOAD <20 FOR MORE THAN THREE  YEARS  And  Healthy CD4 count.  Unfortunately his summers viral load popped up into the thousands though resistance testing failed to show any new evidence of resistance. I wanted to see him shortly thereafter but he has not been seen in clinic until today when he came in accompanied his wife to her visit.   We were able to re-suppress him and he has been undetectable on a regimen --Tivicay 5075mID --Prezista 600m33mD with  --Norvir 100mg66m -AZT  BID --DESCOVY q daily  He continues to have problems with nausea and vomiting that  he attributes to DRV boosted w RTV  We brought him today to consider switch of DRV/RTV BID for Fostemsavir BID along with continuing the remainder of his salvage regimen.   See below re the genotypic data.   I went back and retrieved R from my initial notes [facility] with Pheno/Geno R data that Minh Lonna Duvalable to find from scanned documents.  Putting these together and plugging into [employer] yielded   Drug Resistance Interpretation: PR  PI Major Resistance Mutations:  M46MI, I47IV, I50L, I54IV, V82A, I84IV, L90M  PI Accessory Resistance Mutations:  L10LEFI, F53FL, T74TP  Other Mutations:  I13IV, K20KIM, D60E, I62V, L63P, A71AV, V77I  Protease Inhibitors  atazanavir/r (ATV/r) High-Level Resistance darunavir/r (DRV/r) Intermediate Resistance lopinavir/r (LPV/r) High-Level Resistance  PR Comments PI Major M46I/L are relatively non-polymorphic PI-selected mutations. In combination with other PI-resistance mutations, they are associated with reduced susceptibility to each of the PIs except DRV.  I47V = DRAM is a non-polymorphic PI-selected mutation associated with reduced susceptibility to each of the PIs except SQV and ATV. I47V is included in the [employer] DRV genotypic susceptibility score.  I50L is a non-polymorphic mutation selected by ATV. It causes high-level resistance to ATV and increases susceptibility to most of the remaining PIs.  I54V is a non-polymorphic PI-selected mutation that contributes reduced susceptibility to each of the PIs except DRV.  V82A is a non-polymorphic mutation selected primarily by IDV and  LPV. It reduces susceptibility to these PIs and contributes cross-resistance to each of the remaining PIs except DRV and TPV.  I84V =DRAM  is a non-polymorphic mutation selected by each of the PIs. It causes high-level resistance to ATV, FPV, IDV, NFV and SQV, intermediate resistance to LPV and TPV, and low-level resistance to  DRV.  L90M is a non-polymorphic PI-selected mutation that reduces susceptibility to each of the PIs except TPV and DRV. PI Accessory  L10F is a common non-polymorphic, PI-selected accessory mutation associated with reduced susceptibility to DRV, FPV, IDV, LPV, and NFV.  F53L is a non-polymorphic accessory PI-selected mutation that reduces susceptibility primarily to ATV, SQV, and NFV.  T74P = DRAM is a non-polymorphic PI-selected accessory mutation that occurs primarily in viruses from patients who have received multiple PIs. It is associated with reduced susceptibility to each of the PIs. It is included in the [employer] TPV and [employer] DRV genotypic susceptibility scores.   Other L10F/I/V/R/Y are PI-selected accessory mutations. L10E is a highly unusual mutation at this position. L10I/V are polymorphic, PI-selected accessory mutations that increase the replication of viruses with other PI-resistance mutations. K20I is the consensus amino acid in subtype G and [id number]. In subtypes B and C, K20I is a PI-selected accessory mutation that reduces NFV susceptibility. K20M/V are rare, relatively non-polymorphic PI-selected mutations that have not been well studied. A71V/T are polymorphic, PI-selected accessory mutations that increase the replication of viruses with other PI-resistance mutations. Dosage Considerations There is evidence for intermediate DRV resistance. If DRV is administered it should be used twice daily. Mutation Scoring: PR PI ATV/r DRV/r LPV/r M46I 10 0 10 I47V 10 10 15 I50L 60 -10 -10 F53L 10 0 0 I54V 15 0 15 T74P 10 5 5 V82A 15 0 30 I84V 60 15 30 L90M 25 0 15 M46I + V82A 10 0 10 M46I + I84V + L90M 5 0 5 M46I + L90M 10 0 0 F53L + L90M 10 0 0 I54V + V82A 10 0 10 I54V + L90M 10 0 5 V82A + L90M 10 0 5 L10F 0 5 5 I47V + I84V 0 5 5 Total 280 30 155  Drug Resistance Interpretation: RT  NRTI Resistance Mutations:  M41ML, D67N, L74LV, M184V, L210W,  T215DY  NNRTI Resistance Mutations:  L100I, K101E, K103N, Y181YC, G190S  Other Mutations: T69I, R211RK  Nucleoside Reverse Transcriptase Inhibitors  abacavir (ABC) High-Level Resistance zidovudine (AZT) High-Level Resistance emtricitabine (FTC) High-Level Resistance lamivudine (3TC) High-Level Resistance tenofovir (TDF) Intermediate Resistance Non-nucleoside Reverse Transcriptase Inhibitors efavirenz (EFV) High-Level Resistance etravirine (ETR) High-Level Resistance nevirapine (NVP) High-Level Resistance rilpivirine (RPV) High-Level Resistance  RT Comments NRTI M184V/I cause high-level in vitro resistance to 3TC and FTC and low-level resistance to ddI and ABC. However, M184V/I are not contraindications to continued treatment with 3TC or FTC because they increase susceptibility to AZT, TDF and d4T and are associated with clinically significant reductions in HIV-1 replication. L210W is a TAM that usually occurs in combination with M41L and T215Y. The combination of M41, L210W and T215Y causes high-level resistance to AZT and d4T and intermediate to high-level resistance to ddI, ABC and TDF. T215Y/F cause intermediate/high-level resistance to AZT and d4T, low-level resistance to ddI, and potentially low-level resistance to ABC and TDF. T215S/C/D/E/I/V/N/A/L do not reduce NRTI susceptibility but arise from viruses that once contained T215Y/F. The presence of one of these revertant mutations suggests that the patient may have once had a majority virus population with T215Y/F. T215Y is a TAM that causes intermediate/high-level resistance   to AZT and d4T, low-level resistance to ddI, and potentially low-level resistance to ABC and TDF. M41L is a TAM that usually occurs with T215Y. In combination, M41L plus T215Y confer intermediate / high-level resistance to AZT and d4T and contribute to reduced ddI, ABC and TDF susceptibility. D67N is a non-polymorphic TAM associated with low-level resistance  to AZT and d4T. When present with other TAMs, it contributes reduced susceptibility to ABC, ddI, and TDF. L74V/I cause high-level resistance to ddI and intermediate resistance to ABC.  NNRTI L100I is a non-polymorphic mutation that usually occurs in combination with K103N. It is associated with high-level resistance to NVP, EFV, and RPV and intermediate resistance to ETR. It has a high weight in the [employer] ETR genotypic susceptibility score. K101E is a non-polymorphic primarily accessory mutation that causes intermediate resistance to NVP and RPV, and low-level resistance to EFV and ETR. It has a low weight in the [employer] ETR genotypic susceptibility score. K103N is a non-polymorphic mutation that causes high-level resistance to NVP and EFV. Y181C is a non-polymorphic mutation selected in patients receiving each of the NNRTIs. It causes high-level reduction in NVP susceptibility, intermediate-level reduction in RPV and ETR susceptibility, and low-level reduction in EFV susceptibility. Y181C has a high weight in the [employer] ETR genotypic susceptibility score. G190S is a non-polymorphic mutation that causes high-level resistance to NVP and EFV. It has a low weight in the [employer] ETR genotypic susceptibility score but does not appear to be selected by ETR or RPV or to reduce their in vitro susceptibility in the absence of other NNRTI-resistance mutations. Other T69S/A/I/E are relatively polymorphic mutations weakly selected in patients receiving NRTIs. Their effects on NRTI susceptibility have not been well studied. Mutation Scoring: RT NRTI ABC AZT FTC 3TC TDF M41L 5 15 0 0 5 D67N 5 15 0 0 5 L74V 30 0 0 0 0 M184V 15 -10 60 60 -10 L210W 5 15 0 0 5 T215Y 10 40 0 0 10 M41L + D67N + T215Y _0 M41L + L210W 10 10 0 0 10 M41L + L210W + T215Y 5 0 _1 M41L + T215Y _2 L74V + M184V 15 0 0 0 0 L210W +  T215Y _3 Total 130 110 80 80 55 NNRTI EFV ETR NVP RPV L100I 60 30 60 60 K101E _4 45 K103N 60 0 60 0 Y181C 30 30 60 45 G190S 60 10 60 15  K101E + Y181C _5 0 K101E + G190S 0 5 0 0 Y181C + G190S 0 10 0 10 Total 230 105 275 175 Drug Resistance Interpretation: IN IN Major Resistance Mutations: G140S, Q148H IN Accessory Resistance Mutations: None Other Mutations: None Integrase Strand Transfer Inhibitors  bictegravir (BIC) Intermediate Resistance dolutegravir (DTG) Intermediate Resistance elvitegravir (EVG) High-Level Resistance raltegravir (RAL) High-Level Resistance   IN Comments IN Major G140S/A/C are non-polymorphic mutations that usually occur with Q148 mutations. Alone, they have minimal effects on INSTI susceptibility. However, in combination with Q148 mutations they are associated with high-level resistance to RAL and EVG and intermediate reductions in DTG and BIC susceptibility. Q148H/K/R are non-polymorphic mutations selected by RAL, EVG, and rarely DTG. Q148H/R/K are associated with high-level reductions in RAL and EVG susceptibility particularly when they occur In combination with E138 or G140 mutations. Alone, Q148H/K/R have minimal effects on DTG and BIC susceptibility. But in combination with E138 and G140 mutations they cause moderate and occasionally high-level reductions in  DTG and BIC susceptibility. Dosage Considerations There is evidence for intermediate DTG resistance. If DTG is used, it should be administered twice daily.  Mutation Scoring: IN INSTI BIC DTG EVG RAL G140S _0 Q148H 25 25 60 60 G140S + Q148H 10 10 0 0 Total 45 45 90 90    Past Medical History:  Diagnosis Date  . Abdominal pain   . Anemia   . Arthritis   . Back pain 02/13/2016  . Constipation   . Depression   . Diarrhea   . Foot lesion 12/19/2014  . Gallstones   . Gastric AVM   . GERD (gastroesophageal reflux disease)   . GI bleed   . HIV (human  immunodeficiency virus infection) ([place])   . Hypertension   . IBS (irritable bowel syndrome)   . Infectious colitis   . Interstitial cystitis   . Mechanical heart valve present   . Nausea & vomiting   . Osteopenia 06/02/2018  . Pancreatitis   . 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 Stroke ([place])   . Weight loss, unintentional     Past Surgical History:  Procedure Laterality Date  . AORTIC VALVE REPLACEMENT    . CARDIAC SURGERY    . CHOLECYSTECTOMY  02/12/2012   Procedure: LAPAROSCOPIC CHOLECYSTECTOMY;  Surgeon: Stark Klein, MD;  Location: [place];  Service: General;  Laterality: N/A;  . COLONOSCOPY WITH ESOPHAGOGASTRODUODENOSCOPY (EGD)     with polypectomy  . ESOPHAGOGASTRODUODENOSCOPY N/A 07/24/2012   Procedure: ESOPHAGOGASTRODUODENOSCOPY (EGD);  Surgeon: Beryle Beams, MD;  Location: [facility]COPY;  Service: Endoscopy;  Laterality: N/A;  . KNEE SURGERY    . MULTIPLE EXTRACTIONS WITH ALVEOLOPLASTY N/A 03/13/2018   Procedure: MULTIPLE EXTRACTION;  Surgeon: Diona Browner, DDS;  Location: [place];  Service: Oral Surgery;  Laterality: N/A;    Family History  Problem Relation Age of Onset  . Hypertension Father   . Prostate cancer Father   . Stomach cancer Father   . Hypertension Sister   . Diabetes Maternal Aunt   . Cancer - Other Cousin   . Parkinson's disease Paternal Aunt       Social History   Socioeconomic History  . Marital status: Married    Spouse name: Not on file  . Number of children: 6  . Years of education: Not on file  . Highest education level: Not on file  Occupational History  . Occupation: disability rep  Social Needs  . Financial resource strain: Not on file  . Food insecurity    Worry: Not on file    Inability: Not on file  . Transportation needs    Medical: Not on file    Non-medical: Not on file  Tobacco Use  . Smoking status: Former Smoker    Packs/day: 0.10    Years: 20.00    Pack years: 2.00    Types: Cigars    Quit date:  08/02/2017    Years since quitting: 1.4  . Smokeless tobacco: Never Used  Substance and Sexual Activity  . Alcohol use: Not Currently    Alcohol/week: 0.0 standard drinks  . Drug use: Yes    Frequency: 7.0 times per week    Types: Marijuana    Comment: daily 2 joints   . Sexual activity: Yes    Partners: Female    Birth control/protection: Condom  Lifestyle  . Physical activity    Days per week: Not on file    Minutes per session: Not on file  .  Stress: Not on file  Relationships  . Social connections    Talks on phone: Not on file    Gets together: Not on file    Attends religious service: Not on file    Active member of club or organization: Not on file    Attends meetings of clubs or organizations: Not on file    Relationship status: Not on file  Other Topics Concern  . Not on file  Social History Narrative  . Not on file    Allergies  Allergen Reactions  . Bactrim [Sulfamethoxazole-Trimethoprim]   . Bee Venom Anaphylaxis  . Sulfa Antibiotics Anaphylaxis  . Truvada [Emtricitabine-Tenofovir Df] Anaphylaxis and Rash    Takes plain tenofovir at home  . Lidoderm [Lidocaine] Other (See Comments)    Reaction unknown  . Raltegravir     resistance  . Ceftriaxone Rash  . Sulfamethoxazole Itching, Other (See Comments) and Rash    Other reaction(s): Hypotension (ALLERGY/intolerance)     Current Outpatient Medications:  .  clindamycin (CLEOCIN) 300 MG capsule, Take 1 capsule (300 mg total) by mouth 3 (three) times daily., Disp: 21 capsule, Rfl: 0 .  dolutegravir (TIVICAY) 50 MG tablet, Take 1 tablet (50 mg total) by mouth 2 (two) times daily., Disp: 60 tablet, Rfl: 5 .  emtricitabine-tenofovir AF (DESCOVY) 200-25 MG tablet, Take 1 tablet by mouth daily., Disp: 30 tablet, Rfl: 5 .  enoxaparin (LOVENOX) 100 MG/ML injection, Inject 90 mg into the skin daily., Disp: , Rfl:  .  ENSURE (ENSURE), Take 237 mLs by mouth 2 (two) times daily between meals. Provide a case per month:  BMI<20, Disp: 237 mL, Rfl: 6 .  EPINEPHrine (EPI-PEN) 0.3 mg/0.3 mL DEVI, Inject 0.3 mLs (0.3 mg total) into the muscle once., Disp: 1 Device, Rfl: 1 .  escitalopram (LEXAPRO) 20 MG tablet, TAKE 1 TABLET BY MOUTH   DAILY (Patient taking differently: Take 20 mg by mouth daily. ), Disp: 30 tablet, Rfl: 1 .  LORazepam (ATIVAN) 1 MG tablet, Take 1 mg by mouth at bedtime as needed for anxiety or sleep., Disp: , Rfl:  .  ondansetron (ZOFRAN ODT) 8 MG disintegrating tablet, Take 1 tablet (8 mg total) by mouth every 8 (eight) hours as needed for nausea or vomiting., Disp: 30 tablet, Rfl: 6 .  Opium 10 MG/ML (1%) TINC, Take 0.6 mLs (6 mg total) by mouth every 6 (six) hours. (Patient taking differently: Take 6 mg by mouth every 6 (six) hours as needed for diarrhea or loose stools. ), Disp: 72 mL, Rfl: 0 .  oxyCODONE-acetaminophen (PERCOCET) 5-325 MG tablet, Take 1 tablet by mouth every 4 (four) hours as needed., Disp: 30 tablet, Rfl: 0 .  valACYclovir (VALTREX) 1000 MG tablet, TAKE 1 TABLET (1,000 MG TOTAL) BY MOUTH DAILY. (Patient taking differently: Take 1,000 mg by mouth daily. ), Disp: 30 tablet, Rfl: 4 .  warfarin (COUMADIN) 1 MG tablet, Take 1 mg by mouth every Friday. Take as directed with 5mg tablet per [facility] on Fridays., Disp: , Rfl:  .  warfarin (COUMADIN) 5 MG tablet, Take 5 mg by mouth daily. , Disp: , Rfl:  .  zidovudine (RETROVIR) 300 MG tablet, Take 1 tablet (300 mg total) by mouth 2 (two) times daily., Disp: 60 tablet, Rfl: 5 .  Fostemsavir Tromethamine ER 600 MG TB12, Take 1 tablet by mouth every 12 (twelve) hours., Disp: 60 tablet, Rfl: 5 No current facility-administered medications for this visit.   Facility-Administered Medications Ordered in Other Visits:  .    0.9 %  sodium chloride infusion, , Intravenous, Once, Hatcher, Jeffrey C, MD   Review of Systems  Constitutional: Negative for activity change, diaphoresis and unexpected weight change.  HENT: Negative for congestion,  rhinorrhea, sinus pressure, sneezing, sore throat and trouble swallowing.   Eyes: Negative for photophobia and visual disturbance.  Respiratory: Negative for chest tightness, shortness of breath, wheezing and stridor.   Cardiovascular: Negative for chest pain, palpitations and leg swelling.  Gastrointestinal: Positive for diarrhea, nausea and vomiting. Negative for abdominal distention and anal bleeding.  Genitourinary: Negative for flank pain.  Musculoskeletal: Negative for gait problem and joint swelling.  Skin: Negative for rash and wound.  Neurological: Negative for tremors.  Hematological: Negative for adenopathy. Does not bruise/bleed easily.  Psychiatric/Behavioral: Negative for agitation, confusion, decreased concentration, hallucinations, self-injury and suicidal ideas. The patient is not hyperactive.        Objective:   Physical Exam  Constitutional: He is oriented to person, place, and time. No distress.  HENT:  Head: Normocephalic and atraumatic.  Mouth/Throat: Oropharynx is clear and moist. No oropharyngeal exudate.  Eyes: Conjunctivae and EOM are normal. No scleral icterus.  Neck: Normal range of motion. Neck supple. No JVD present.  Cardiovascular: Regular rhythm and normal heart sounds.  Pulmonary/Chest: Effort normal. No respiratory distress. He has no wheezes.  Abdominal: Soft. Bowel sounds are normal. He exhibits no distension.  Musculoskeletal:        General: No tenderness or edema.  Lymphadenopathy:    He has no cervical adenopathy.  Neurological: He is alert and oriented to person, place, and time. He displays no atrophy. He exhibits normal muscle tone. Coordination normal.  Skin: Skin is warm and dry. He is not diaphoretic. No erythema. No pallor.  Psychiatric: He has a normal mood and affect. His speech is normal and behavior is normal. Judgment and thought content normal. Cognition and memory are normal.          Assessment & Plan:    # 1HIV Disease,  hx of AIDS,  Highly Resistant Virus.   We will check labs today and make a switch to Fostemsavir from his boosted PI  We will then get VL in one month and follow him very closely.  If he develops virological failure we will add back his boosted PI and consider idea of TROGARZO as another option though very awkward with IV infusions  I spent greater than 25 minutes with the patient including greater than 50% of time in face to face counsel of the patient re his viruses cumulative resistance mutations and plans to carefully switch out his boosted PI for FSTVR and in coordination of his care.    

## 2018-12-29 LAB — T-HELPER CELL (CD4) - (RCID CLINIC ONLY)
CD4 % Helper T Cell: 19 % — ABNORMAL LOW (ref 33–65)
CD4 T Cell Abs: 533 /uL (ref 400–1790)

## 2018-12-29 NOTE — Telephone Encounter (Signed)
[facility] Patient Advocate Encounter  Received notification from [facility] Medicaid that the request for a prior authorization is not necessary because they will not pay for Rukobia.  It is not on their formulary to date.     I contacted [employer] Patient Assistance and they apologetically said there is nothing they can do to provide the medication because Mr. Fred is insured.  Venida Jarvis. Nadara Mustard [place] Patient [facility] for Infectious Disease Phone: [phone] Fax:  [phone]

## 2018-12-29 NOTE — Telephone Encounter (Signed)
Can we restore [place] BID 600mg  Prezista and 100mg  Norvir to his meds this is very cruel to deny him access to [employer]

## 2018-12-30 NOTE — Telephone Encounter (Signed)
Ok thanks so much Cassie!

## 2018-12-30 NOTE — Telephone Encounter (Signed)
I have reached out to [place] at [place] and the rep is contacting us today for access help. I would hold off until I talk to them!

## 2018-12-31 LAB — HIV-1 RNA QUANT-NO REFLEX-BLD
HIV 1 RNA Quant: 36 copies/mL — ABNORMAL HIGH
HIV-1 RNA Quant, Log: 1.56 Log copies/mL — ABNORMAL HIGH

## 2019-01-04 NOTE — Telephone Encounter (Signed)
[facility] Patient Advocate Encounter  Received a call this morning from [employer] representative, Jill Poling with [place] stating that she got word that [place] was already approved with [employer] but the only reason was his type of medicaid. She suggested I go through [employer] and submit an insurance investigation. This is quite a lengthy process so I called [employer] [employer] clinical investigation instead. They provided me with an extensive list from the most recent [place] [employer] of [place] and Health choice preferred and non-preferred drug list. The type of medicaid coverage the patient has does not matter to drug coverage.    HIV medications do not require a prior authorization via [place] [employer]. Marny Lowenstein does not require a prior authorization as well but it is not listed as a covered NDC because of the newness of the medication on the market and it does not have a BSG code in the pharmacy drug file. The representative I spoke to with the clinical intervention team put in a request to get the medication added to the preferred drug list. She was unsure if approved how long of a time frame it would take to make a determination.  Reference [id number] manager's first name Carlyon Prows is coordinating the request to the state for investigation and approval. At this time, there is nothing we can do to get this medication approved by the patient's insurance company. Medicaid has our information and will fax or e-mail if the medication gets approved.

## 2019-01-11 NOTE — Telephone Encounter (Signed)
Excellent

## 2019-01-11 NOTE — Telephone Encounter (Signed)
[facility] Patient Advocate Encounter  Marny Lowenstein is now approved to the [place] per pharmacy director at [facility], [employer]. This will make the patient's copay $3.00 and will not require a prior authorization. Will contact the patient's pharmacy and let them know to process.

## 2019-01-12 NOTE — Telephone Encounter (Signed)
I will check with Kalman Shan today, yesterday the pharmacist said he would take care of it.

## 2019-01-12 NOTE — Telephone Encounter (Signed)
RN spoke with pharmacy who stated medication went out for delivery this afternoon, patient should receive it by 8pm.  RN notified patient, he will start it when he receives it.

## 2019-01-12 NOTE — Telephone Encounter (Signed)
The pharmacy made a notation of which two medications to discontinue and will be delivering [place] to his home address.

## 2019-01-12 NOTE — Telephone Encounter (Signed)
So we are stopping his Norvir and Prezista he should have an appointment scheduled roughly a month from start date on Rukobia

## 2019-01-12 NOTE — Telephone Encounter (Signed)
I spoke with Brandon [name]. He has not received the [place] yet.  He has been taking tivicay, ziduvodine, and descovy alone.  I asked him to please let us know as soon as the [place] is delivered.   I left a [place] asking pharmacist to confirm delivery today.  Landis Gandy, RN

## 2019-01-26 ENCOUNTER — Other Ambulatory Visit: Payer: Self-pay

## 2019-01-26 ENCOUNTER — Other Ambulatory Visit: Payer: Medicaid Other

## 2019-01-26 DIAGNOSIS — B20 Human immunodeficiency virus [HIV] disease: Secondary | ICD-10-CM

## 2019-01-27 LAB — T-HELPER CELL (CD4) - (RCID CLINIC ONLY)
CD4 % Helper T Cell: 19 % — ABNORMAL LOW (ref 33–65)
CD4 T Cell Abs: 654 /uL (ref 400–1790)

## 2019-02-02 LAB — CBC WITH DIFFERENTIAL/PLATELET
Absolute Monocytes: 410 cells/uL (ref 200–950)
Basophils Absolute: 22 cells/uL (ref 0–200)
Basophils Relative: 0.4 %
Eosinophils Absolute: 189 cells/uL (ref 15–500)
Eosinophils Relative: 3.5 %
HCT: 36.3 % — ABNORMAL LOW (ref 38.5–50.0)
Hemoglobin: 12.5 g/dL — ABNORMAL LOW (ref 13.2–17.1)
Lymphs Abs: 3240 cells/uL (ref 850–3900)
MCH: 37.7 pg — ABNORMAL HIGH (ref 27.0–33.0)
MCHC: 34.4 g/dL (ref 32.0–36.0)
MCV: 109.3 fL — ABNORMAL HIGH (ref 80.0–100.0)
MPV: 11.6 fL (ref 7.5–12.5)
Monocytes Relative: 7.6 %
Neutro Abs: 1539 cells/uL (ref 1500–7800)
Neutrophils Relative %: 28.5 %
Platelets: 158 10*3/uL (ref 140–400)
RBC: 3.32 10*6/uL — ABNORMAL LOW (ref 4.20–5.80)
RDW: 13.2 % (ref 11.0–15.0)
Total Lymphocyte: 60 %
WBC: 5.4 10*3/uL (ref 3.8–10.8)

## 2019-02-02 LAB — COMPLETE METABOLIC PANEL WITH GFR
AG Ratio: 1.9 (calc) (ref 1.0–2.5)
ALT: 10 U/L (ref 9–46)
AST: 17 U/L (ref 10–35)
Albumin: 4.2 g/dL (ref 3.6–5.1)
Alkaline phosphatase (APISO): 54 U/L (ref 35–144)
BUN: 16 mg/dL (ref 7–25)
CO2: 24 mmol/L (ref 20–32)
Calcium: 9.2 mg/dL (ref 8.6–10.3)
Chloride: 106 mmol/L (ref 98–110)
Creat: 1.25 mg/dL (ref 0.70–1.33)
GFR, Est African American: 77 mL/min/{1.73_m2} (ref >=60)
GFR, Est Non African American: 66 mL/min/{1.73_m2} (ref >=60)
Globulin: 2.2 g/dL (calc) (ref 1.9–3.7)
Glucose, Bld: 73 mg/dL (ref 65–99)
Potassium: 4.4 mmol/L (ref 3.5–5.3)
Sodium: 139 mmol/L (ref 135–146)
Total Bilirubin: 0.6 mg/dL (ref 0.2–1.2)
Total Protein: 6.4 g/dL (ref 6.1–8.1)

## 2019-02-02 LAB — HIV RNA, RTPCR W/R GT (RTI, PI,INT)
HIV 1 RNA Quant: <20 copies/mL
HIV-1 RNA Quant, Log: <1.3 Log copies/mL

## 2019-02-10 ENCOUNTER — Ambulatory Visit (INDEPENDENT_AMBULATORY_CARE_PROVIDER_SITE_OTHER): Payer: Medicaid Other | Admitting: Infectious Disease

## 2019-02-10 ENCOUNTER — Other Ambulatory Visit: Payer: Self-pay

## 2019-02-10 ENCOUNTER — Encounter: Payer: Self-pay | Admitting: Infectious Disease

## 2019-02-10 DIAGNOSIS — R1013 Epigastric pain: Secondary | ICD-10-CM

## 2019-02-10 DIAGNOSIS — B20 Human immunodeficiency virus [HIV] disease: Secondary | ICD-10-CM

## 2019-02-10 DIAGNOSIS — R1115 Cyclical vomiting syndrome unrelated to migraine: Secondary | ICD-10-CM

## 2019-02-10 DIAGNOSIS — Z952 Presence of prosthetic heart valve: Secondary | ICD-10-CM | POA: Diagnosis not present

## 2019-02-10 NOTE — Progress Notes (Signed)
Chief complaint: loose stools on new regimen  Virtual Visit via Video Note  I connected with [name] on 02/10/19 at  4:00 PM EST by a video enabled telemedicine application and verified that I am speaking with the correct person using two identifiers.  Location: Patient: Home, outside  Provider: [facility]   I discussed the limitations of evaluation and management by telemedicine and the availability of in person appointments. The patient expressed understanding and agreed to proceed.  History of Present Illness:    Subjective:    Patient ID: Brandon [name], male    DOB: 1965/07/04, 53 y.o.   MRN: [id number]  HPI  53 year old Brandon [name] is a highly complicated man with history of  HIV/AIDS and Multi-DRUG RESISTANT virus formerly followed at [facility]. His HIV nadir was  20 when we first met him   He had been on various complicated antiretroviral regimens in the past, with unfortunate GENOTYPIC resistance to all non-nucleoside reverse transcriptase inhibitors and all NRTIs, Resistance to all protease inhibitors with the exception of Prezista which had some activity genotypically,, Resistance to Isentress, and Elvitegravir and  reduced S to dolutegravir having both a 148H and 140S  and with Dual tropic virus.  02/12/2010 phenotype at [facility] showed:  RT: NRTI: ABC, DDI, D4T, AZT, TDF: resistant; 3TC, FTC: susceptible; NNRTI: EFV susceptible; RPV, NVP, ETR, DLV: Resistant; PI: pan-resistant  He had decided nto go back onto [facility] and WAS  referred to [facility].  We  Had  seen him and placed him on a  salvage regimen of Prezista 663m  Twice daily boosted with Norvir 1035mtwice daily, Tivicay twice daily, Combivir twice daily and once daily Viread.  And since then he haD BEEN WITH AN UNDETECTABLE VIRAL LOAD <20 FOR MORE THAN THREE  YEARS  And  Healthy CD4 count.  Unfortunately his summers viral load popped up into the thousands though resistance testing failed to show any new  evidence of resistance. I wanted to see him shortly thereafter but he has not been seen in clinic until today when he came in accompanied his wife to her visit.   We were able to re-suppress him and he has been undetectable on a regimen --Tivicay 5042mID --Prezista 600m53mD with  --Norvir 100mg10m -AZT  BID --DESCOVY q daily  He continued  to have problems with nausea and vomiting that he attributes to DRV boosted w RTV  We have subsequently switched him ed off DRV/RTV BID for Fostemsavir BID along with continuing the remainder of his salvage regimen.   One month after switch his VL is <20  He has MUCH, MUCH less nausea, and is eating much better, He is still having loose stools that he believes is due to getting used to new medication.     Past Medical History:  Diagnosis Date  . Abdominal pain   . Anemia   . Arthritis   . Back pain 02/13/2016  . Constipation   . Depression   . Diarrhea   . Foot lesion 12/19/2014  . Gallstones   . Gastric AVM   . GERD (gastroesophageal reflux disease)   . GI bleed   . HIV (human immunodeficiency virus infection) (HCC) [place] Hypertension   . IBS (irritable bowel syndrome)   . Infectious colitis   . Interstitial cystitis   . Mechanical heart valve present   . Nausea & vomiting   . Osteopenia 06/02/2018  . Pancreatitis   .  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 Stroke ([place])   . Weight loss, unintentional     Past Surgical History:  Procedure Laterality Date  . AORTIC VALVE REPLACEMENT    . CARDIAC SURGERY    . CHOLECYSTECTOMY  02/12/2012   Procedure: LAPAROSCOPIC CHOLECYSTECTOMY;  Surgeon: Stark Klein, MD;  Location: [place];  Service: General;  Laterality: N/A;  . COLONOSCOPY WITH ESOPHAGOGASTRODUODENOSCOPY (EGD)     with polypectomy  . ESOPHAGOGASTRODUODENOSCOPY N/A 07/24/2012   Procedure: ESOPHAGOGASTRODUODENOSCOPY (EGD);  Surgeon: Beryle Beams, MD;  Location: [facility] ENDOSCOPY;  Service: Endoscopy;  Laterality: N/A;   . KNEE SURGERY    . MULTIPLE EXTRACTIONS WITH ALVEOLOPLASTY N/A 03/13/2018   Procedure: MULTIPLE EXTRACTION;  Surgeon: Diona Browner, DDS;  Location: [place];  Service: Oral Surgery;  Laterality: N/A;    Family History  Problem Relation Age of Onset  . Hypertension Father   . Prostate cancer Father   . Stomach cancer Father   . Hypertension Sister   . Diabetes Maternal Aunt   . Cancer - Other Cousin   . Parkinson's disease Paternal Aunt       Social History   Socioeconomic History  . Marital status: Married    Spouse name: Not on file  . Number of children: 6  . Years of education: Not on file  . Highest education level: Not on file  Occupational History  . Occupation: disability rep  Social Needs  . Financial resource strain: Not on file  . Food insecurity    Worry: Not on file    Inability: Not on file  . Transportation needs    Medical: Not on file    Non-medical: Not on file  Tobacco Use  . Smoking status: Former Smoker    Packs/day: 0.10    Years: 20.00    Pack years: 2.00    Types: Cigars    Quit date: 08/02/2017    Years since quitting: 1.5  . Smokeless tobacco: Never Used  Substance and Sexual Activity  . Alcohol use: Not Currently    Alcohol/week: 0.0 standard drinks  . Drug use: Yes    Frequency: 7.0 times per week    Types: Marijuana    Comment: daily 2 joints   . Sexual activity: Yes    Partners: Female    Birth control/protection: Condom  Lifestyle  . Physical activity    Days per week: Not on file    Minutes per session: Not on file  . Stress: Not on file  Relationships  . Social Herbalist on phone: Not on file    Gets together: Not on file    Attends religious service: Not on file    Active member of club or organization: Not on file    Attends meetings of clubs or organizations: Not on file    Relationship status: Not on file  Other Topics Concern  . Not on file  Social History Narrative  . Not on file    Allergies   Allergen Reactions  . Bactrim [Sulfamethoxazole-Trimethoprim]   . Bee Venom Anaphylaxis  . Sulfa Antibiotics Anaphylaxis  . Truvada [Emtricitabine-Tenofovir Df] Anaphylaxis and Rash    Takes plain tenofovir at home  . Lidoderm [Lidocaine] Other (See Comments)    Reaction unknown  . Raltegravir     resistance  . Ceftriaxone Rash  . Sulfamethoxazole Itching, Other (See Comments) and Rash    Other reaction(s): Hypotension (ALLERGY/intolerance)     Current Outpatient Medications:  .  clindamycin (CLEOCIN) 300 MG capsule, Take 1 capsule (300 mg total) by mouth 3 (three) times daily., Disp: 21 capsule, Rfl: 0 .  dolutegravir (TIVICAY) 50 MG tablet, Take 1 tablet (50 mg total) by mouth 2 (two) times daily., Disp: 60 tablet, Rfl: 5 .  emtricitabine-tenofovir AF (DESCOVY) 200-25 MG tablet, Take 1 tablet by mouth daily., Disp: 30 tablet, Rfl: 5 .  enoxaparin (LOVENOX) 100 MG/ML injection, Inject 90 mg into the skin daily., Disp: , Rfl:  .  ENSURE (ENSURE), Take 237 mLs by mouth 2 (two) times daily between meals. Provide a case per month: BMI<20, Disp: 237 mL, Rfl: 6 .  EPINEPHrine (EPI-PEN) 0.3 mg/0.3 mL DEVI, Inject 0.3 mLs (0.3 mg total) into the muscle once., Disp: 1 Device, Rfl: 1 .  escitalopram (LEXAPRO) 20 MG tablet, TAKE 1 TABLET BY MOUTH   DAILY (Patient taking differently: Take 20 mg by mouth daily. ), Disp: 30 tablet, Rfl: 1 .  Fostemsavir Tromethamine ER 600 MG TB12, Take 1 tablet by mouth every 12 (twelve) hours., Disp: 60 tablet, Rfl: 5 .  LORazepam (ATIVAN) 1 MG tablet, Take 1 mg by mouth at bedtime as needed for anxiety or sleep., Disp: , Rfl:  .  ondansetron (ZOFRAN ODT) 8 MG disintegrating tablet, Take 1 tablet (8 mg total) by mouth every 8 (eight) hours as needed for nausea or vomiting., Disp: 30 tablet, Rfl: 6 .  Opium 10 MG/ML (1%) TINC, Take 0.6 mLs (6 mg total) by mouth every 6 (six) hours. (Patient taking differently: Take 6 mg by mouth every 6 (six) hours as needed for  diarrhea or loose stools. ), Disp: 72 mL, Rfl: 0 .  oxyCODONE-acetaminophen (PERCOCET) 5-325 MG tablet, Take 1 tablet by mouth every 4 (four) hours as needed., Disp: 30 tablet, Rfl: 0 .  valACYclovir (VALTREX) 1000 MG tablet, TAKE 1 TABLET (1,000 MG TOTAL) BY MOUTH DAILY. (Patient taking differently: Take 1,000 mg by mouth daily. ), Disp: 30 tablet, Rfl: 4 .  warfarin (COUMADIN) 1 MG tablet, Take 1 mg by mouth every Friday. Take as directed with 11m tablet per [facility] on Fridays., Disp: , Rfl:  .  warfarin (COUMADIN) 5 MG tablet, Take 5 mg by mouth daily. , Disp: , Rfl:  .  zidovudine (RETROVIR) 300 MG tablet, Take 1 tablet (300 mg total) by mouth 2 (two) times daily., Disp: 60 tablet, Rfl: 5 No current facility-administered medications for this visit.   Facility-Administered Medications Ordered in Other Visits:  .  0.9 %  sodium chloride infusion, , Intravenous, Once, HCampbell Riches MD   Review of Systems  Constitutional: Negative for activity change, diaphoresis and unexpected weight change.  HENT: Negative for congestion, rhinorrhea, sinus pressure, sneezing, sore throat and trouble swallowing.   Eyes: Negative for photophobia and visual disturbance.  Respiratory: Negative for chest tightness, shortness of breath, wheezing and stridor.   Cardiovascular: Negative for chest pain, palpitations and leg swelling.  Gastrointestinal: Positive for diarrhea. Negative for abdominal distention, anal bleeding, nausea and vomiting.  Genitourinary: Negative for flank pain.  Musculoskeletal: Negative for gait problem and joint swelling.  Skin: Negative for rash and wound.  Neurological: Negative for tremors.  Hematological: Negative for adenopathy. Does not bruise/bleed easily.  Psychiatric/Behavioral: Negative for agitation, confusion, decreased concentration, hallucinations, self-injury and suicidal ideas. The patient is not hyperactive.    Observations/Objective:     Objective:     FChinmaylooks happy and was in good spirits. He is well controlled on his new salvage  regimen.      Assessment & Plan:   HIV disease: continue current regimen and check labs in early January  Diarrhea: hopefully this improves as well  Nausea: dramatically better with change in regimen  AVR: on anticoagulation  Follow Up Instructions:    I discussed the assessment and treatment plan with the patient. The patient was provided an opportunity to ask questions and all were answered. The patient agreed with the plan and demonstrated an understanding of the instructions.   The patient was advised to call back or seek an in-person evaluation if the symptoms worsen or if the condition fails to improve as anticipated.     [name], MD

## 2019-03-15 ENCOUNTER — Other Ambulatory Visit: Payer: Self-pay | Admitting: *Deleted

## 2019-03-15 DIAGNOSIS — B20 Human immunodeficiency virus [HIV] disease: Secondary | ICD-10-CM

## 2019-03-16 ENCOUNTER — Other Ambulatory Visit: Payer: Medicaid Other

## 2019-03-22 ENCOUNTER — Other Ambulatory Visit: Payer: Self-pay

## 2019-03-22 ENCOUNTER — Other Ambulatory Visit: Payer: Medicaid Other

## 2019-03-22 DIAGNOSIS — B20 Human immunodeficiency virus [HIV] disease: Secondary | ICD-10-CM

## 2019-03-23 LAB — T-HELPER CELL (CD4) - (RCID CLINIC ONLY)
CD4 % Helper T Cell: 20 % — ABNORMAL LOW (ref 33–65)
CD4 T Cell Abs: 584 /uL (ref 400–1790)

## 2019-03-27 LAB — CBC WITH DIFFERENTIAL/PLATELET
Absolute Monocytes: 342 cells/uL (ref 200–950)
Basophils Absolute: 31 cells/uL (ref 0–200)
Basophils Relative: 0.6 %
Eosinophils Absolute: 189 cells/uL (ref 15–500)
Eosinophils Relative: 3.7 %
HCT: 34 % — ABNORMAL LOW (ref 38.5–50.0)
Hemoglobin: 11.9 g/dL — ABNORMAL LOW (ref 13.2–17.1)
Lymphs Abs: 2820 cells/uL (ref 850–3900)
MCH: 38.5 pg — ABNORMAL HIGH (ref 27.0–33.0)
MCHC: 35 g/dL (ref 32.0–36.0)
MCV: 110 fL — ABNORMAL HIGH (ref 80.0–100.0)
MPV: 11.3 fL (ref 7.5–12.5)
Monocytes Relative: 6.7 %
Neutro Abs: 1719 cells/uL (ref 1500–7800)
Neutrophils Relative %: 33.7 %
Platelets: 167 10*3/uL (ref 140–400)
RBC: 3.09 10*6/uL — ABNORMAL LOW (ref 4.20–5.80)
RDW: 13 % (ref 11.0–15.0)
Total Lymphocyte: 55.3 %
WBC: 5.1 10*3/uL (ref 3.8–10.8)

## 2019-03-27 LAB — HIV-1 RNA QUANT-NO REFLEX-BLD
HIV 1 RNA Quant: <20 copies/mL — AB
HIV-1 RNA Quant, Log: <1.3 Log copies/mL — AB

## 2019-03-27 LAB — COMPLETE METABOLIC PANEL WITH GFR
AG Ratio: 1.9 (calc) (ref 1.0–2.5)
ALT: 7 U/L — ABNORMAL LOW (ref 9–46)
AST: 16 U/L (ref 10–35)
Albumin: 4.1 g/dL (ref 3.6–5.1)
Alkaline phosphatase (APISO): 52 U/L (ref 35–144)
BUN: 17 mg/dL (ref 7–25)
CO2: 29 mmol/L (ref 20–32)
Calcium: 9.2 mg/dL (ref 8.6–10.3)
Chloride: 107 mmol/L (ref 98–110)
Creat: 1.26 mg/dL (ref 0.70–1.33)
GFR, Est African American: 76 mL/min/{1.73_m2} (ref >=60)
GFR, Est Non African American: 66 mL/min/{1.73_m2} (ref >=60)
Globulin: 2.2 g/dL (calc) (ref 1.9–3.7)
Glucose, Bld: 69 mg/dL (ref 65–99)
Potassium: 4.3 mmol/L (ref 3.5–5.3)
Sodium: 141 mmol/L (ref 135–146)
Total Bilirubin: 0.6 mg/dL (ref 0.2–1.2)
Total Protein: 6.3 g/dL (ref 6.1–8.1)

## 2019-04-01 ENCOUNTER — Other Ambulatory Visit: Payer: Self-pay | Admitting: Infectious Disease

## 2019-04-01 DIAGNOSIS — R1115 Cyclical vomiting syndrome unrelated to migraine: Secondary | ICD-10-CM

## 2019-04-02 ENCOUNTER — Telehealth: Payer: Self-pay

## 2019-04-02 NOTE — Telephone Encounter (Signed)
COVID-19 Pre-Screening Questions:04/02/19    Do you currently have a fever (>100 F), chills or unexplained body aches? NO  Are you currently experiencing new cough, shortness of breath, sore throat, runny nose? NO  .  Have you recently travelled outside the state of [place] in the last 14 days?NO  .  Have you been in contact with someone that is currently pending confirmation of Covid19 testing or has been confirmed to have the [place] virus?  NO  **If the patient answers NO to ALL questions -  advise the patient to please call the clinic before coming to the office should any symptoms develop.

## 2019-04-05 ENCOUNTER — Ambulatory Visit: Payer: Medicaid Other | Admitting: Infectious Disease

## 2019-04-05 ENCOUNTER — Encounter: Payer: Self-pay | Admitting: Infectious Disease

## 2019-04-05 ENCOUNTER — Other Ambulatory Visit: Payer: Self-pay

## 2019-04-05 VITALS — BP 153/92 | HR 94 | Wt 132.0 lb

## 2019-04-05 DIAGNOSIS — Z7901 Long term (current) use of anticoagulants: Secondary | ICD-10-CM | POA: Diagnosis not present

## 2019-04-05 DIAGNOSIS — Z952 Presence of prosthetic heart valve: Secondary | ICD-10-CM | POA: Diagnosis not present

## 2019-04-05 DIAGNOSIS — B20 Human immunodeficiency virus [HIV] disease: Secondary | ICD-10-CM

## 2019-04-05 DIAGNOSIS — Z79899 Other long term (current) drug therapy: Secondary | ICD-10-CM | POA: Diagnosis not present

## 2019-04-05 DIAGNOSIS — R112 Nausea with vomiting, unspecified: Secondary | ICD-10-CM

## 2019-04-05 DIAGNOSIS — R1013 Epigastric pain: Secondary | ICD-10-CM

## 2019-04-05 NOTE — Progress Notes (Signed)
Subjective:   Chief complaint he is here for follow-up for his HIV and on medications with improved nausea and less vomiting on new regimen he is also curious to have his liver panel done just my chart and have it done   Patient ID: Brandon [name], male    DOB: 06-29-1965, 54 y.o.   MRN: [id number]  HPI   54 year old Brandon [name] is a highly complicated man with history of  HIV/AIDS and Multi-DRUG RESISTANT virus formerly followed at [facility]. His HIV nadir was  20 when we first met him   He had been on various complicated antiretroviral regimens in the past, with unfortunate GENOTYPIC resistance to all non-nucleoside reverse transcriptase inhibitors and all NRTIs, Resistance to all protease inhibitors with the exception of Prezista which had some activity genotypically,, Resistance to Isentress, and Elvitegravir and  reduced S to dolutegravir having both a 148H and 140S  and with Dual tropic virus.  02/12/2010 phenotype at [facility] showed:  RT: NRTI: ABC, DDI, D4T, AZT, TDF: resistant; 3TC, FTC: susceptible; NNRTI: EFV susceptible; RPV, NVP, ETR, DLV: Resistant; PI: pan-resistant  He had decided nto go back onto [facility] and WAS  referred to [facility].  We  Had  seen him and placed him on a  salvage regimen of Prezista 626m  Twice daily boosted with Norvir 1075mtwice daily, Tivicay twice daily, Combivir twice daily and once daily Viread.  And since then he haD BEEN WITH AN UNDETECTABLE VIRAL LOAD <20 FOR MORE THAN THREE  YEARS  And  Healthy CD4 count.  Unfortunately his summers viral load popped up into the thousands though resistance testing failed to show any new evidence of resistance. I wanted to see him shortly thereafter but he has not been seen in clinic until today when he came in accompanied his wife to her visit.   We were able to re-suppress him and he has been undetectable on a regimen --Tivicay 5080mID --Prezista 600m39mD with  --Norvir 100mg59m -AZT   BID --DESCOVY q daily  He continued  to have problems with nausea and vomiting that he attributes to DRV boosted w RTV  We have subsequently switched him ed off DRV/RTV BID for Fostemsavir BID along with continuing the remainder of his salvage regimen.  His nausea has dramatically improved and he states that he does not vomit nearly as frequently as he did before on the prior regimen.  He still does suffer the nausea and occasionally does vomit but says that he is getting used to it.  When I inquired about potential relationship to marijuana given the prior concerned that he might have a cyclic vomiting syndrome related to marijuana use he stated that he never saw any difference when he took himself off of smoking marijuana regularly   Past Medical History:  Diagnosis Date  . Abdominal pain   . Anemia   . Arthritis   . Back pain 02/13/2016  . Constipation   . Depression   . Diarrhea   . Foot lesion 12/19/2014  . Gallstones   . Gastric AVM   . GERD (gastroesophageal reflux disease)   . GI bleed   . HIV (human immunodeficiency virus infection) (HCC) [place] Hypertension   . IBS (irritable bowel syndrome)   . Infectious colitis   . Interstitial cystitis   . Mechanical heart valve present   . Nausea & vomiting   . Osteopenia 06/02/2018  . Pancreatitis   .  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 Stroke ([place])   . Weight loss, unintentional     Past Surgical History:  Procedure Laterality Date  . AORTIC VALVE REPLACEMENT    . CARDIAC SURGERY    . CHOLECYSTECTOMY  02/12/2012   Procedure: LAPAROSCOPIC CHOLECYSTECTOMY;  Surgeon: Stark Klein, MD;  Location: [place];  Service: General;  Laterality: N/A;  . COLONOSCOPY WITH ESOPHAGOGASTRODUODENOSCOPY (EGD)     with polypectomy  . ESOPHAGOGASTRODUODENOSCOPY N/A 07/24/2012   Procedure: ESOPHAGOGASTRODUODENOSCOPY (EGD);  Surgeon: Beryle Beams, MD;  Location: [facility] ENDOSCOPY;  Service: Endoscopy;  Laterality: N/A;  . KNEE SURGERY     . MULTIPLE EXTRACTIONS WITH ALVEOLOPLASTY N/A 03/13/2018   Procedure: MULTIPLE EXTRACTION;  Surgeon: Diona Browner, DDS;  Location: [place];  Service: Oral Surgery;  Laterality: N/A;    Family History  Problem Relation Age of Onset  . Hypertension Father   . Prostate cancer Father   . Stomach cancer Father   . Hypertension Sister   . Diabetes Maternal Aunt   . Cancer - Other Cousin   . Parkinson's disease Paternal Aunt       Social History   Socioeconomic History  . Marital status: Married    Spouse name: Not on file  . Number of children: 6  . Years of education: Not on file  . Highest education level: Not on file  Occupational History  . Occupation: disability rep  Tobacco Use  . Smoking status: Former Smoker    Packs/day: 0.10    Years: 20.00    Pack years: 2.00    Types: Cigars    Quit date: 08/02/2017    Years since quitting: 1.6  . Smokeless tobacco: Never Used  Substance and Sexual Activity  . Alcohol use: Not Currently    Alcohol/week: 0.0 standard drinks  . Drug use: Yes    Frequency: 7.0 times per week    Types: Marijuana    Comment: daily 2 joints   . Sexual activity: Yes    Partners: Female    Birth control/protection: Condom  Other Topics Concern  . Not on file  Social History Narrative  . Not on file   Social Determinants of Health   Financial Resource Strain:   . Difficulty of Paying Living Expenses: Not on file  Food Insecurity:   . Worried About Charity fundraiser in the Last Year: Not on file  . Ran Out of Food in the Last Year: Not on file  Transportation Needs:   . Lack of Transportation (Medical): Not on file  . Lack of Transportation (Non-Medical): Not on file  Physical Activity:   . Days of Exercise per Week: Not on file  . Minutes of Exercise per Session: Not on file  Stress:   . Feeling of Stress : Not on file  Social Connections:   . Frequency of Communication with Friends and Family: Not on file  . Frequency of Social  Gatherings with Friends and Family: Not on file  . Attends Religious Services: Not on file  . Active Member of Clubs or Organizations: Not on file  . Attends Archivist Meetings: Not on file  . Marital Status: Not on file    Allergies  Allergen Reactions  . Bactrim [Sulfamethoxazole-Trimethoprim]   . Bee Venom Anaphylaxis  . Sulfa Antibiotics Anaphylaxis  . Truvada [Emtricitabine-Tenofovir Df] Anaphylaxis and Rash    Takes plain tenofovir at home  . Lidoderm [Lidocaine] Other (See Comments)    Reaction unknown  .  Raltegravir     resistance  . Ceftriaxone Rash  . Sulfamethoxazole Itching, Other (See Comments) and Rash    Other reaction(s): Hypotension (ALLERGY/intolerance)     Current Outpatient Medications:  .  dolutegravir (TIVICAY) 50 MG tablet, Take 1 tablet (50 mg total) by mouth 2 (two) times daily., Disp: 60 tablet, Rfl: 5 .  emtricitabine-tenofovir AF (DESCOVY) 200-25 MG tablet, Take 1 tablet by mouth daily., Disp: 30 tablet, Rfl: 5 .  escitalopram (LEXAPRO) 20 MG tablet, TAKE 1 TABLET BY MOUTH   DAILY (Patient taking differently: Take 20 mg by mouth daily. ), Disp: 30 tablet, Rfl: 1 .  Fostemsavir Tromethamine ER 600 MG TB12, Take 1 tablet by mouth every 12 (twelve) hours., Disp: 60 tablet, Rfl: 5 .  LORazepam (ATIVAN) 1 MG tablet, Take 1 mg by mouth at bedtime as needed for anxiety or sleep., Disp: , Rfl:  .  ondansetron (ZOFRAN-ODT) 8 MG disintegrating tablet, Take 1 tablet (8 mg total) by mouth every 8 (eight) hours as needed for nausea or vomiting., Disp: 30 tablet, Rfl: 6 .  valACYclovir (VALTREX) 1000 MG tablet, TAKE 1 TABLET (1,000 MG TOTAL) BY MOUTH DAILY. (Patient taking differently: Take 1,000 mg by mouth daily. ), Disp: 30 tablet, Rfl: 4 .  warfarin (COUMADIN) 1 MG tablet, Take 1 mg by mouth every Friday. Take as directed with 76m tablet per [facility] on Fridays., Disp: , Rfl:  .  warfarin (COUMADIN) 5 MG tablet, Take 5 mg by mouth daily. , Disp: ,  Rfl:  .  zidovudine (RETROVIR) 300 MG tablet, Take 1 tablet (300 mg total) by mouth 2 (two) times daily., Disp: 60 tablet, Rfl: 5 .  EPINEPHrine (EPI-PEN) 0.3 mg/0.3 mL DEVI, Inject 0.3 mLs (0.3 mg total) into the muscle once. (Patient not taking: Reported on 04/05/2019), Disp: 1 Device, Rfl: 1 .  Opium 10 MG/ML (1%) TINC, Take 0.6 mLs (6 mg total) by mouth every 6 (six) hours. (Patient not taking: Reported on 04/05/2019), Disp: 72 mL, Rfl: 0 .  traZODone (DESYREL) 50 MG tablet, TAKE ONE TABLET BY MOUTH AT BEDTIME FOR SLEEP, Disp: , Rfl:  No current facility-administered medications for this visit.  Facility-Administered Medications Ordered in Other Visits:  .  0.9 %  sodium chloride infusion, , Intravenous, Once, HCampbell Riches MD  Review of Systems  Constitutional: Negative for activity change, appetite change, chills, diaphoresis, fatigue, fever and unexpected weight change.  HENT: Negative for congestion, rhinorrhea, sinus pressure, sneezing, sore throat and trouble swallowing.   Eyes: Negative for photophobia and visual disturbance.  Respiratory: Negative for cough, chest tightness, shortness of breath, wheezing and stridor.   Cardiovascular: Negative for chest pain, palpitations and leg swelling.  Gastrointestinal: Positive for nausea. Negative for abdominal distention, abdominal pain, anal bleeding, blood in stool, constipation, diarrhea and vomiting.  Genitourinary: Negative for difficulty urinating, dysuria, flank pain and hematuria.  Musculoskeletal: Negative for arthralgias, back pain, gait problem, joint swelling and myalgias.  Skin: Negative for color change, pallor, rash and wound.  Neurological: Negative for dizziness, tremors, weakness and light-headedness.  Hematological: Negative for adenopathy. Does not bruise/bleed easily.  Psychiatric/Behavioral: Negative for agitation, behavioral problems, confusion, decreased concentration, dysphoric mood and sleep disturbance.        Objective:   Physical Exam Constitutional:      Appearance: He is well-developed.  HENT:     Head: Normocephalic and atraumatic.  Eyes:     Conjunctiva/sclera: Conjunctivae normal.  Cardiovascular:     Rate and Rhythm:  Normal rate and regular rhythm.  Pulmonary:     Effort: Pulmonary effort is normal. No respiratory distress.     Breath sounds: No wheezing.  Abdominal:     General: There is no distension.     Palpations: Abdomen is soft.  Musculoskeletal:        General: No tenderness. Normal range of motion.     Cervical back: Normal range of motion and neck supple.  Skin:    General: Skin is warm and dry.     Coloration: Skin is not pale.     Findings: No erythema or rash.  Neurological:     General: No focal deficit present.     Mental Status: He is alert and oriented to person, place, and time.  Psychiatric:        Mood and Affect: Mood normal.        Behavior: Behavior normal.        Thought Content: Thought content normal.        Judgment: Judgment normal.           Assessment & Plan:   HIV disease with multidrug resistant virus and patient who did not tolerate protease inhibitors well:   Continue twice daily TIVICAY Continue twice daily AZT Continue twice daily fostemsavir  Continue Descovy  We will recheck labs in 3 months time.  Monitoring on chronic medications: We will check a fasting lipid panel at the next blood draw.  Aortic valve replacement on anticoagulation.  Nausea and vomiting: This is dramatically improved after we changed him off of PI to fostemsavir.

## 2019-05-31 ENCOUNTER — Other Ambulatory Visit: Payer: Self-pay | Admitting: Infectious Disease

## 2019-05-31 DIAGNOSIS — B20 Human immunodeficiency virus [HIV] disease: Secondary | ICD-10-CM

## 2019-06-28 ENCOUNTER — Other Ambulatory Visit: Payer: Medicaid Other

## 2019-06-28 ENCOUNTER — Other Ambulatory Visit: Payer: Self-pay

## 2019-06-28 ENCOUNTER — Other Ambulatory Visit: Payer: Self-pay | Admitting: Infectious Disease

## 2019-06-28 DIAGNOSIS — Z952 Presence of prosthetic heart valve: Secondary | ICD-10-CM

## 2019-06-28 DIAGNOSIS — B20 Human immunodeficiency virus [HIV] disease: Secondary | ICD-10-CM

## 2019-06-28 DIAGNOSIS — Z7901 Long term (current) use of anticoagulants: Secondary | ICD-10-CM

## 2019-06-29 LAB — T-HELPER CELL (CD4) - (RCID CLINIC ONLY)
CD4 % Helper T Cell: 19 % — ABNORMAL LOW (ref 33–65)
CD4 T Cell Abs: 314 /uL — ABNORMAL LOW (ref 400–1790)

## 2019-07-01 LAB — CBC WITH DIFFERENTIAL/PLATELET
Absolute Monocytes: 261 cells/uL (ref 200–950)
Basophils Absolute: 31 cells/uL (ref 0–200)
Basophils Relative: 0.8 %
Eosinophils Absolute: 90 cells/uL (ref 15–500)
Eosinophils Relative: 2.3 %
HCT: 37.7 % — ABNORMAL LOW (ref 38.5–50.0)
Hemoglobin: 13.4 g/dL (ref 13.2–17.1)
Lymphs Abs: 1650 cells/uL (ref 850–3900)
MCH: 40 pg — ABNORMAL HIGH (ref 27.0–33.0)
MCHC: 35.5 g/dL (ref 32.0–36.0)
MCV: 112.5 fL — ABNORMAL HIGH (ref 80.0–100.0)
MPV: 11.1 fL (ref 7.5–12.5)
Monocytes Relative: 6.7 %
Neutro Abs: 1868 cells/uL (ref 1500–7800)
Neutrophils Relative %: 47.9 %
Platelets: 177 10*3/uL (ref 140–400)
RBC: 3.35 10*6/uL — ABNORMAL LOW (ref 4.20–5.80)
RDW: 12.6 % (ref 11.0–15.0)
Total Lymphocyte: 42.3 %
WBC: 3.9 10*3/uL (ref 3.8–10.8)

## 2019-07-01 LAB — LIPID PANEL
Cholesterol: 118 mg/dL (ref <200)
HDL: 44 mg/dL (ref >=40)
LDL Cholesterol (Calc): 56 mg/dL (calc)
Non-HDL Cholesterol (Calc): 74 mg/dL (calc) (ref <130)
Total CHOL/HDL Ratio: 2.7 (calc) (ref <5.0)
Triglycerides: 94 mg/dL (ref <150)

## 2019-07-01 LAB — COMPLETE METABOLIC PANEL WITH GFR
AG Ratio: 1.7 (calc) (ref 1.0–2.5)
ALT: 15 U/L (ref 9–46)
AST: 23 U/L (ref 10–35)
Albumin: 4.1 g/dL (ref 3.6–5.1)
Alkaline phosphatase (APISO): 58 U/L (ref 35–144)
BUN: 21 mg/dL (ref 7–25)
CO2: 29 mmol/L (ref 20–32)
Calcium: 9.1 mg/dL (ref 8.6–10.3)
Chloride: 106 mmol/L (ref 98–110)
Creat: 1.28 mg/dL (ref 0.70–1.33)
GFR, Est African American: 75 mL/min/{1.73_m2} (ref >=60)
GFR, Est Non African American: 64 mL/min/{1.73_m2} (ref >=60)
Globulin: 2.4 g/dL (calc) (ref 1.9–3.7)
Glucose, Bld: 97 mg/dL (ref 65–99)
Potassium: 4.4 mmol/L (ref 3.5–5.3)
Sodium: 142 mmol/L (ref 135–146)
Total Bilirubin: 0.6 mg/dL (ref 0.2–1.2)
Total Protein: 6.5 g/dL (ref 6.1–8.1)

## 2019-07-01 LAB — HIV-1 RNA QUANT-NO REFLEX-BLD
HIV 1 RNA Quant: 29 copies/mL — ABNORMAL HIGH
HIV-1 RNA Quant, Log: 1.46 Log copies/mL — ABNORMAL HIGH

## 2019-07-06 ENCOUNTER — Telehealth: Payer: Self-pay

## 2019-07-06 ENCOUNTER — Other Ambulatory Visit: Payer: Self-pay

## 2019-07-06 ENCOUNTER — Emergency Department (HOSPITAL_COMMUNITY)
Admission: EM | Admit: 2019-07-06 | Discharge: 2019-07-06 | Disposition: A | Discharge status: Home / Self Care | Payer: Medicaid Other | Attending: Emergency Medicine | Admitting: Emergency Medicine

## 2019-07-06 ENCOUNTER — Ambulatory Visit (HOSPITAL_COMMUNITY): Payer: Medicaid Other

## 2019-07-06 ENCOUNTER — Encounter (HOSPITAL_COMMUNITY): Payer: Self-pay

## 2019-07-06 ENCOUNTER — Emergency Department (HOSPITAL_COMMUNITY): Payer: Medicaid Other

## 2019-07-06 DIAGNOSIS — R197 Diarrhea, unspecified: Secondary | ICD-10-CM | POA: Insufficient documentation

## 2019-07-06 DIAGNOSIS — Z87891 Personal history of nicotine dependence: Secondary | ICD-10-CM | POA: Diagnosis not present

## 2019-07-06 DIAGNOSIS — Z7901 Long term (current) use of anticoagulants: Secondary | ICD-10-CM | POA: Insufficient documentation

## 2019-07-06 DIAGNOSIS — B20 Human immunodeficiency virus [HIV] disease: Secondary | ICD-10-CM | POA: Insufficient documentation

## 2019-07-06 DIAGNOSIS — R1011 Right upper quadrant pain: Secondary | ICD-10-CM | POA: Diagnosis not present

## 2019-07-06 DIAGNOSIS — R1013 Epigastric pain: Secondary | ICD-10-CM

## 2019-07-06 DIAGNOSIS — Z79899 Other long term (current) drug therapy: Secondary | ICD-10-CM | POA: Insufficient documentation

## 2019-07-06 DIAGNOSIS — Z8673 Personal history of transient ischemic attack (TIA), and cerebral infarction without residual deficits: Secondary | ICD-10-CM | POA: Diagnosis not present

## 2019-07-06 DIAGNOSIS — R112 Nausea with vomiting, unspecified: Secondary | ICD-10-CM | POA: Insufficient documentation

## 2019-07-06 DIAGNOSIS — I10 Essential (primary) hypertension: Secondary | ICD-10-CM | POA: Insufficient documentation

## 2019-07-06 DIAGNOSIS — Z20822 Contact with and (suspected) exposure to covid-19: Secondary | ICD-10-CM | POA: Insufficient documentation

## 2019-07-06 LAB — CBC
HCT: 45.1 % (ref 39.0–52.0)
Hemoglobin: 16.1 g/dL (ref 13.0–17.0)
MCH: 39.3 pg — ABNORMAL HIGH (ref 26.0–34.0)
MCHC: 35.7 g/dL (ref 30.0–36.0)
MCV: 110 fL — ABNORMAL HIGH (ref 80.0–100.0)
Platelets: 250 10*3/uL (ref 150–400)
RBC: 4.1 MIL/uL — ABNORMAL LOW (ref 4.22–5.81)
RDW: 12.7 % (ref 11.5–15.5)
WBC: 9.4 10*3/uL (ref 4.0–10.5)
nRBC: 0 % (ref 0.0–0.2)

## 2019-07-06 LAB — URINALYSIS, ROUTINE W REFLEX MICROSCOPIC
Bilirubin Urine: NEGATIVE
Glucose, UA: NEGATIVE mg/dL
Ketones, ur: 5 mg/dL — AB
Leukocytes,Ua: NEGATIVE
Nitrite: NEGATIVE
Protein, ur: 100 mg/dL — AB
Specific Gravity, Urine: 1.021 (ref 1.005–1.030)
pH: 5 (ref 5.0–8.0)

## 2019-07-06 LAB — COMPREHENSIVE METABOLIC PANEL
ALT: 22 U/L (ref 0–44)
AST: 38 U/L (ref 15–41)
Albumin: 5 g/dL (ref 3.5–5.0)
Alkaline Phosphatase: 63 U/L (ref 38–126)
Anion gap: 15 (ref 5–15)
BUN: 38 mg/dL — ABNORMAL HIGH (ref 6–20)
CO2: 20 mmol/L — ABNORMAL LOW (ref 22–32)
Calcium: 10 mg/dL (ref 8.9–10.3)
Chloride: 103 mmol/L (ref 98–111)
Creatinine, Ser: 2.01 mg/dL — ABNORMAL HIGH (ref 0.61–1.24)
GFR calc Af Amer: 43 mL/min — ABNORMAL LOW (ref >60)
GFR calc non Af Amer: 37 mL/min — ABNORMAL LOW (ref >60)
Glucose, Bld: 136 mg/dL — ABNORMAL HIGH (ref 70–99)
Potassium: 4 mmol/L (ref 3.5–5.1)
Sodium: 138 mmol/L (ref 135–145)
Total Bilirubin: 2.3 mg/dL — ABNORMAL HIGH (ref 0.3–1.2)
Total Protein: 8.1 g/dL (ref 6.5–8.1)

## 2019-07-06 LAB — RESPIRATORY PANEL BY RT PCR (FLU A&B, COVID)
Influenza A by PCR: NEGATIVE
Influenza B by PCR: NEGATIVE
SARS Coronavirus 2 by RT PCR: NEGATIVE

## 2019-07-06 LAB — TROPONIN I (HIGH SENSITIVITY)
Troponin I (High Sensitivity): 31 ng/L — ABNORMAL HIGH (ref <18)
Troponin I (High Sensitivity): 35 ng/L — ABNORMAL HIGH (ref <18)

## 2019-07-06 LAB — LIPASE, BLOOD: Lipase: 26 U/L (ref 11–51)

## 2019-07-06 MED ORDER — MORPHINE SULFATE (PF) 4 MG/ML IV SOLN
4.0000 mg | Freq: Once | INTRAVENOUS | Status: AC
  Administered 2019-07-06: 4 mg via INTRAVENOUS
  Filled 2019-07-06: qty 1

## 2019-07-06 MED ORDER — SODIUM CHLORIDE 0.9 % IV BOLUS
1000.0000 mL | Freq: Once | INTRAVENOUS | Status: AC
  Administered 2019-07-06: 1000 mL via INTRAVENOUS

## 2019-07-06 MED ORDER — PROMETHAZINE HCL 25 MG/ML IJ SOLN
12.5000 mg | Freq: Once | INTRAMUSCULAR | Status: AC
  Administered 2019-07-06: 12.5 mg via INTRAVENOUS
  Filled 2019-07-06: qty 1

## 2019-07-06 MED ORDER — SODIUM CHLORIDE 0.9 % IV BOLUS
1000.0000 mL | Freq: Once | INTRAVENOUS | Status: AC
  Administered 2019-07-06: 17:00:00 1000 mL via INTRAVENOUS

## 2019-07-06 MED ORDER — SODIUM CHLORIDE 0.9 % IV BOLUS: 1000.0000 mL | Freq: Once | INTRAVENOUS | Status: DC

## 2019-07-06 MED ORDER — SODIUM CHLORIDE 0.9% FLUSH: 3.0000 mL | Freq: Once | INTRAVENOUS | Status: DC

## 2019-07-06 NOTE — Telephone Encounter (Signed)
Please tell Fuehrer that is VIRAL LOAD IS PERFECTLY FINE at 29 copies that is barely detectabl and likely a blip.   They need to let us interpret his labs OK?

## 2019-07-06 NOTE — ED Notes (Signed)
Patient verbalizes understanding of discharge instructions. Opportunity for questioning and answers were provided. Armband removed by staff, pt discharged from ED. Pt. ambulatory and discharged home.  

## 2019-07-06 NOTE — Discharge Instructions (Addendum)
You are seen in the emergency department today for abdominal pain, nausea, vomiting.  Make sure you follow-up with your PCP, Dr. Jodi Mourning, ASAP for your increased creatinine and increased bilirubin.  Make sure you drink plenty of water and get plenty of rest.  Come back to the emergency department if you have worsening abdominal pain, nonstop vomiting, fever, chills, weakness, dizziness, chest pain, shortness of breath.  Look at the rehydration handout.

## 2019-07-06 NOTE — ED Triage Notes (Signed)
Patient complains of abdominal cramping that started on Sunday with nausea and vomiting. States that he thinks related to his covid shot earlier in April. Patient alert and oriented, reports hx of pancreatitis and colitis.

## 2019-07-06 NOTE — Telephone Encounter (Signed)
Pt called office today stating he is going to [facility] after leaving Urgent care. State he has been feeling nauseous/ vomiting since 4/5 after covid vaccine.  States he was told by urgent care that his VL is "out of [place]". Would like MD to be made aware. [place]

## 2019-07-06 NOTE — ED Provider Notes (Signed)
[facility] EMERGENCY DEPARTMENT Provider Note   CSN: [id number] Arrival date & time: 07/06/19  1211     History No chief complaint on file.   Brandon [name] is a 53 y.o. male with pertinent past medical history of hypertension, history of HIV, GI bleed, pancreatitis, osteopenia, stroke, colitis, valve replacement (on warfarin), that presents to the emergency room for abdominal, cramping, nausea, vomiting for 3 days. He was seen at urgent care today and provider there felt it would be necessary to go to the emergency room due to his chronic pancreatitis.  He states that he is vomited about 10 times in the past 12 hours, bilious vomit, no bloody vomit.  He also states that he has had diarrhea twice in the past 2 hours no bloody or greasy stool.  He states that his abdominal pain is diffuse and cramping in his epigastrium.  He has tried Zofran without relief.  He states that movement aggravates the pain.  He denies any alcohol use.  He states that he was not doing anything out of the abnormal on Sunday, he states that he woke up with nausea and it progressed throughout the day.  He states that he normally has these abdominal pain with nausea episodes monthly, however it has been progressing. He has not seen anyone for this. He states that whenever he has the symptoms he feels short of breath.  Denies any chest pain.  He denies any arm, neck, jaw radiation, paresthesias, weakness, syncope, fevers, chills, back pain, neck pain, vision changes, headache.  He states that he is going to see his infectious disease Dr. Tommy Medal in about a week.  He states that he is medication compliant.     Per chart review urgent care PA told patient that his viral load was high.  Patient's infectious disease Dr. Tommy Medal just put in a note that his viral load was perfectly fine and barely detectable.  HPI     Past Medical History:  Diagnosis Date  . Abdominal pain   . Anemia   . Arthritis   .  Back pain 02/13/2016  . Constipation   . Depression   . Diarrhea   . Foot lesion 12/19/2014  . Gallstones   . Gastric AVM   . GERD (gastroesophageal reflux disease)   . GI bleed   . HIV (human immunodeficiency virus infection) ([place])   . Hypertension   . IBS (irritable bowel syndrome)   . Infectious colitis   . Interstitial cystitis   . Mechanical heart valve present   . Nausea & vomiting   . Osteopenia 06/02/2018  . Pancreatitis   . 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 Stroke ([place])   . Weight loss, unintentional     Patient Active Problem List   Diagnosis Date Noted  . Encounter for long-term (current) use of medications 04/05/2019  . Osteopenia 06/02/2018  . HIV disease ([place]) 11/28/2017  . Back pain 02/13/2016  . Foot lesion 12/19/2014  . 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 Thumb pain 03/31/2014  . Left hand weakness 03/31/2014  . Opiate dependence ([place]) 03/31/2014  . Major depression, recurrent, chronic ([place]) 03/31/2014  . Cyclic vomiting syndrome 03/31/2014  . Alleged drug diversion 01/31/2014  . Protein-calorie malnutrition, severe ([place]) 08/24/2013  . Diarrhea 08/23/2013  . Gastroenteritis 08/05/2013  . Depression 03/17/2013  . Sinus congestion 03/17/2013  . Odynophagia 07/24/2012  . Persistent vomiting 07/24/2012  . Candida esophagitis ([place]) 07/23/2012  . Intractable  nausea and vomiting 07/23/2012  . Adjustment disorder with mixed anxiety and depressed mood 05/14/2012  . Genital herpes 05/14/2012  . Long term (current) use of anticoagulants 05/14/2012  . cerebral artey occ 05/14/2012  . Unspecified cerebral artery occlusion with cerebral infarction 05/14/2012  . Chronic pancreatitis ([place]) 02/20/2012  . Hypokalemia 02/20/2012  . Acute pancreatitis 02/09/2012  . Nausea & vomiting 02/09/2012  . Abdominal pain 02/09/2012  . Anemia 02/09/2012  . S/P AVR (aortic valve replacement) 02/09/2012  . Septic arthritis of knee ([place])  02/09/2012  . Warfarin-induced coagulopathy ([place]) 02/09/2012  . AIDS ([place]) 02/09/2012    Past Surgical History:  Procedure Laterality Date  . AORTIC VALVE REPLACEMENT    . CARDIAC SURGERY    . CHOLECYSTECTOMY  02/12/2012   Procedure: LAPAROSCOPIC CHOLECYSTECTOMY;  Surgeon: Stark Klein, MD;  Location: [facility];  Service: General;  Laterality: N/A;  . COLONOSCOPY WITH ESOPHAGOGASTRODUODENOSCOPY (EGD)     with polypectomy  . ESOPHAGOGASTRODUODENOSCOPY N/A 07/24/2012   Procedure: ESOPHAGOGASTRODUODENOSCOPY (EGD);  Surgeon: Beryle Beams, MD;  Location: [facility] ENDOSCOPY;  Service: Endoscopy;  Laterality: N/A;  . KNEE SURGERY    . MULTIPLE EXTRACTIONS WITH ALVEOLOPLASTY N/A 03/13/2018   Procedure: MULTIPLE EXTRACTION;  Surgeon: Diona Browner, DDS;  Location: [facility];  Service: Oral Surgery;  Laterality: N/A;       Family History  Problem Relation Age of Onset  . Hypertension Father   . Prostate cancer Father   . Stomach cancer Father   . Hypertension Sister   . Diabetes Maternal Aunt   . Cancer - Other Cousin   . Parkinson's disease Paternal Aunt     Social History   Tobacco Use  . Smoking status: Former Smoker    Packs/day: 0.10    Years: 20.00    Pack years: 2.00    Types: Cigars    Quit date: 08/02/2017    Years since quitting: 1.9  . Smokeless tobacco: Never Used  Substance Use Topics  . Alcohol use: Not Currently    Alcohol/week: 0.0 standard drinks  . Drug use: Yes    Frequency: 7.0 times per week    Types: Marijuana    Comment: daily 2 joints     Home Medications Prior to Admission medications   Medication Sig Start Date End Date Taking? Authorizing Provider  DESCOVY 200-25 MG tablet Take 1 tablet by mouth daily. 06/28/19  Yes Tommy Medal, Lavell Islam, MD  dolutegravir (TIVICAY) 50 MG tablet Take 1 tablet (50 mg total) by mouth 2 (two) times daily. 12/28/18  Yes Tommy Medal, Lavell Islam, MD  escitalopram (LEXAPRO) 20 MG tablet TAKE 1 TABLET BY MOUTH   DAILY Patient taking  differently: Take 20 mg by mouth daily.  02/14/16  Yes Tommy Medal, Lavell Islam, MD  LORazepam (ATIVAN) 1 MG tablet Take 1 mg by mouth at bedtime as needed for anxiety or sleep.   Yes [provider]  ondansetron (ZOFRAN-ODT) 8 MG disintegrating tablet Take 1 tablet (8 mg total) by mouth every 8 (eight) hours as needed for nausea or vomiting. 04/02/19  Yes Tommy Medal, Lavell Islam, MD  RUKOBIA 600 MG TB12 Take 1 tablet by mouth every 12 (twelve) hours. Patient taking differently: Take 600 mg by mouth every 12 (twelve) hours.  06/28/19  Yes Tommy Medal, Lavell Islam, MD  traZODone (DESYREL) 50 MG tablet Take 50 mg by mouth at bedtime.  12/11/18  Yes [provider]  valACYclovir (VALTREX) 1000 MG tablet TAKE 1 TABLET (1,000 MG  TOTAL) BY MOUTH DAILY. Patient taking differently: Take 1,000 mg by mouth daily.  02/07/16  Yes Truman Hayward, MD  warfarin (COUMADIN) 1 MG tablet Take 1 mg by mouth every Friday. Take as directed with 5mg  tablet per [facility] on Fridays. 03/24/14  Yes [provider]  warfarin (COUMADIN) 5 MG tablet Take 5 mg by mouth daily.    Yes [provider]  zidovudine (RETROVIR) 300 MG tablet Take 1 tablet (300 mg total) by mouth 2 (two) times daily. 12/28/18  Yes Tommy Medal, Lavell Islam, MD  EPINEPHrine (EPI-PEN) 0.3 mg/0.3 mL DEVI Inject 0.3 mLs (0.3 mg total) into the muscle once. Patient not taking: Reported on 04/05/2019 08/20/12   Tommy Medal, Lavell Islam, MD  Opium 10 MG/ML (1%) TINC Take 0.6 mLs (6 mg total) by mouth every 6 (six) hours. Patient not taking: Reported on 04/05/2019 07/25/16   Tommy Medal, Lavell Islam, MD    Allergies    Bactrim [sulfamethoxazole-trimethoprim], Bee venom, Sulfa antibiotics, Truvada [emtricitabine-tenofovir df], Lidoderm [lidocaine], Raltegravir, Ceftriaxone, and Sulfamethoxazole  Review of Systems   Review of Systems  Constitutional: Negative for chills, diaphoresis, fatigue and fever.  HENT: Negative for congestion, sore  throat and trouble swallowing.   Eyes: Negative for pain and visual disturbance.  Respiratory: Negative for cough, shortness of breath and wheezing.   Cardiovascular: Negative for chest pain, palpitations and leg swelling.  Gastrointestinal: Positive for abdominal pain, diarrhea, nausea and vomiting. Negative for abdominal distention.  Genitourinary: Negative for difficulty urinating.  Musculoskeletal: Negative for back pain, neck pain and neck stiffness.  Skin: Negative for pallor.  Neurological: Negative for dizziness, speech difficulty, weakness and headaches.  Psychiatric/Behavioral: Negative for confusion.    Physical Exam Updated Vital Signs BP 113/83 (BP Location: Right Arm)   Pulse 93   Temp 98 F (36.7 C) (Oral)   Resp 16   Ht 5\' 8"  (1.727 m)   Wt 61.2 kg   SpO2 99%   BMI 20.53 kg/m   Physical Exam Constitutional:      Appearance: He is ill-appearing. He is not toxic-appearing or diaphoretic.  HENT:     Head: Normocephalic and atraumatic.  Eyes:     General: Scleral icterus present.     Extraocular Movements: Extraocular movements intact.     Conjunctiva/sclera: Conjunctivae normal.     Pupils: Pupils are equal, round, and reactive to light.  Cardiovascular:     Rate and Rhythm: Normal rate and regular rhythm.     Pulses: Normal pulses.     Heart sounds: Murmur present.  Pulmonary:     Effort: Respiratory distress present.     Breath sounds: Normal breath sounds. No stridor. No wheezing, rhonchi or rales.  Chest:     Chest wall: No tenderness.  Abdominal:     General: Abdomen is flat. Bowel sounds are normal. There is no distension.     Palpations: Abdomen is soft.     Tenderness: There is abdominal tenderness (epigastric area ). There is guarding. There is no right CVA tenderness, left CVA tenderness or rebound.  Musculoskeletal:        General: No swelling or tenderness.     Cervical back: Normal range of motion and neck supple. No rigidity.     Right  lower leg: No edema.     Left lower leg: No edema.  Lymphadenopathy:     Cervical: No cervical adenopathy.  Skin:    General: Skin is warm and dry.  Coloration: Skin is not jaundiced or pale.     Findings: No bruising or erythema.  Neurological:     General: No focal deficit present.     Mental Status: He is alert and oriented to person, place, and time.     Motor: No weakness.  Psychiatric:        Mood and Affect: Mood normal.        Behavior: Behavior normal.     ED Results / Procedures / Treatments   Labs (all labs ordered are listed, but only abnormal results are displayed) Labs Reviewed  COMPREHENSIVE METABOLIC PANEL - Abnormal; Notable for the following components:      Result Value   CO2 20 (*)    Glucose, Bld 136 (*)    BUN 38 (*)    Creatinine, Ser 2.01 (*)    Total Bilirubin 2.3 (*)    GFR calc non Af Amer 37 (*)    GFR calc Af Amer 43 (*)    All other components within normal limits  CBC - Abnormal; Notable for the following components:   RBC 4.10 (*)    MCV 110.0 (*)    MCH 39.3 (*)    All other components within normal limits  URINALYSIS, ROUTINE W REFLEX MICROSCOPIC - Abnormal; Notable for the following components:   APPearance HAZY (*)    Hgb urine dipstick MODERATE (*)    Ketones, ur 5 (*)    Protein, ur 100 (*)    Bacteria, UA FEW (*)    All other components within normal limits  TROPONIN I (HIGH SENSITIVITY) - Abnormal; Notable for the following components:   Troponin I (High Sensitivity) 31 (*)    All other components within normal limits  TROPONIN I (HIGH SENSITIVITY) - Abnormal; Notable for the following components:   Troponin I (High Sensitivity) 35 (*)    All other components within normal limits  RESPIRATORY PANEL BY RT PCR (FLU A&B, COVID)  LIPASE, BLOOD    EKG EKG Interpretation  Date/Time:  Tuesday July 06 2019 17:14:45 EDT Ventricular Rate:  101 PR Interval:  132 QRS Duration: 96 QT Interval:  388 QTC Calculation: 503 R  Axis:   -16 Text Interpretation: Sinus tachycardia with frequent Premature ventricular complexes Biatrial enlargement RSR' or QR pattern in V1 suggests right ventricular conduction delay Cannot rule out Anterior infarct , age undetermined Abnormal ECG Similar to Jan 2020 tracing No STEMI Confirmed by Nanda Quinton [phone]) on 07/06/2019 5:25:22 PM   Radiology CT ABDOMEN PELVIS WO CONTRAST  Result Date: 07/06/2019 CLINICAL DATA:  Nausea vomiting for several days EXAM: CT ABDOMEN AND PELVIS WITHOUT CONTRAST TECHNIQUE: Multidetector CT imaging of the abdomen and pelvis was performed following the standard protocol without IV contrast. COMPARISON:  07/23/2012 FINDINGS: Lower chest: Lung bases are free of acute infiltrate or sizable effusion. Hepatobiliary: Gallbladder is been surgically removed. The liver is homogeneous in attenuation. Known hypoechoic mass on recent ultrasound is not well appreciated on this noncontrast study. Pancreas: Unremarkable. No pancreatic ductal dilatation or surrounding inflammatory changes. Spleen: Normal in size without focal abnormality. Adrenals/Urinary Tract: Adrenal glands are within normal limits. Kidneys are well visualized bilaterally. No renal calculi or obstructive changes are noted. The bladder is well distended. Stomach/Bowel: No obstructive or inflammatory changes of the colon are seen. The appendix is partially visualized. No inflammatory changes are noted. Small bowel and stomach appear within normal limits. Vascular/Lymphatic: Aortic atherosclerosis. No enlarged abdominal or pelvic lymph nodes. Reproductive: Prostate is unremarkable. Other:  No abdominal wall hernia or abnormality. No abdominopelvic ascites. Musculoskeletal: No acute or significant osseous findings. IMPRESSION: No acute abnormality is noted. The previously seen hyperechoic lesion within the liver is not well appreciated on this noncontrast study. Status post cholecystectomy. Electronically Signed   By:  Inez Catalina M.D.   On: 07/06/2019 20:19   DG Chest 2 View  Result Date: 07/06/2019 CLINICAL DATA:  Shortness of breath beginning 2 days ago. Nausea and vomiting. EXAM: CHEST - 2 VIEW COMPARISON:  08/24/2013 FINDINGS: Distant median sternotomy and aortic valve replacement. Heart size is normal. Mediastinal shadows otherwise normal. The lungs are clear. The vascularity is normal. No effusions. No abnormal bone finding. IMPRESSION: No active disease.  Distant aortic valve replacement. Electronically Signed   By: Nelson Chimes M.D.   On: 07/06/2019 16:27   US Abdomen Limited RUQ  Result Date: 07/06/2019 CLINICAL DATA:  Right upper quadrant pain. History of cholecystectomy EXAM: ULTRASOUND ABDOMEN LIMITED RIGHT UPPER QUADRANT COMPARISON:  08/23/2013 FINDINGS: Gallbladder: Surgically absent. Common bile duct: Diameter: 7 mm Liver: Well circumscribed homogeneously hyperechoic mass within the anterior aspect of the left hepatic lobe measuring up to 8 mm, unchanged in appearance from prior ultrasound 2015, and most likely representing a hemangioma. No additional liver lesions. Within normal limits in parenchymal echogenicity. Portal vein is patent on color Doppler imaging with normal direction of blood flow towards the liver. Other: None. IMPRESSION: 1. No acute abnormality within the right upper quadrant status post cholecystectomy. 2. Stable homogeneously hyperechoic lesion in the left hepatic lobe most suggestive of a benign hepatic hemangioma. Electronically Signed   By: Davina Poke D.O.   On: 07/06/2019 17:02    Procedures Procedures (including critical care time)  Medications Ordered in ED Medications  sodium chloride flush (NS) 0.9 % injection 3 mL (3 mLs Intravenous Not Given 07/06/19 1631)  sodium chloride 0.9 % bolus 1,000 mL (0 mLs Intravenous Stopped 07/06/19 1927)  morphine 4 MG/ML injection 4 mg (4 mg Intravenous Given 07/06/19 1633)  promethazine (PHENERGAN) injection 12.5 mg (12.5 mg  Intravenous Given 07/06/19 1634)  sodium chloride 0.9 % bolus 1,000 mL (0 mLs Intravenous Stopped 07/06/19 2116)    ED Course  I have reviewed the triage vital signs and the nursing notes.  Pertinent labs & imaging results that were available during my care of the patient were reviewed by me and considered in my medical decision making (see chart for details).  Clinical Course as of Jul 07 3  Tue Jul 06, 2019  1500 Creatinine(!): 2.01 [SP]    Clinical Course User Index [SP] Alfredia Client, PA-C   MDM Rules/Calculators/A&P                      ESTIBEN VLAD is a 54 y.o. male with pertinent past medical history of hypertension, history of HIV, GI bleed, pancreatitis, osteopenia, stroke, colitis, valve replacement (on warfarin), that presents to the emergency room for abdominal, cramping, nausea, vomiting for 3 days. Differential diagnoses considered include but are not limited to ACS, AAA, mesenteric ischemia, appendicitis, diverticulitis, DKA, gastritis, gastroenteritis, AMI, nephrolithiasis, pancreatitis, peritonitis, adrenal insufficiency,lead poisoning, iron toxicity, intestinal ischemia, constipation, UTI,SBO/LBO, splenic rupture, biliary disease, IBD, IBS, PUD, or hepatitis.  Will order Covid swab due to patient's presentation and immunocompromised state. Initial interventions IV morphine, IV Phenergan, and IV fluids.  CXR interpreted by me demonstrated no active cardiopulmonary disease. EKG seen by Dr. Laverta Baltimore not concerning.  Labs demonstrated creatinine bump from 1.28  days ago, it is now 2.01.  Bilirubin up to 2.3, was normally days ago.  Eyes slightly icteric, will order right upper quadrant ultrasound.  Lipase is normal today. Troponin 31, second troponin 35. This is most likely not due to ACS, patient is not complaining of any chest pain.  EKG is normal. Right upper quadrant ultrasound did not show any active disease.  Will order CT with p.o. contrast due to patient's AKI. CT  without any acute abnormalities.   Upon reassessment patient's pain is improving.  He states that his pain is now basically resolved, he states that his pain was 10 out of 10 when he initially presented.  He sitting up in bed and does not look ill appearing anywhere.  He was unable to sit up in the prior.  Explained to patient my concerns for his AKI and wanting to admit him for observation overnight due to this.  Patient states that he has been wanting to leave for the past couple of hours on his ride is already here.  Explained risks of leaving, patient states that he will come back to the emergency department if he needs to.  He states that he will follow-up with his PCP, Dr. Jodi Mourning, in the next couple of days.  He messaged her in front of me.  I explained that his creatinine and bilirubin were high today and he should  Stay for the night (mentioned this twice).  He states that he just wants to sleep in his own bed and he will leave.  Return precautions given -intractable vomiting, fever, chills, weakness, dizziness, chest pain, shortness of breath, radiation down the arm, diaphoresis, abdominal pain but worsening.  He has an appointment with Dr. Marissa Nestle, infectious disease, next week.  He states that he feels as if he could eat now, however did not want to p.o. challenge and wanted to leave.  I discussed this case with my attending physician, Dr. Laverta Baltimore, who cosigned this note including patient's presenting symptoms, physical exam, and planned diagnostics and interventions. Attending physician stated agreement with plan or made changes to plan which were implemented.   Attending physician assessed patient at bedside.   Final Clinical Impression(s) / ED Diagnoses Final diagnoses:  RUQ abdominal pain  Nausea vomiting and diarrhea  Epigastric pain    Rx / DC Orders ED Discharge Orders    None       Alfredia Client, PA-C 07/07/19 0007    Margette Fast, MD 07/07/19 [phone]

## 2019-07-26 ENCOUNTER — Encounter: Payer: Medicaid Other | Admitting: Infectious Disease

## 2019-08-02 ENCOUNTER — Encounter: Payer: Medicaid Other | Admitting: Infectious Disease

## 2019-08-04 ENCOUNTER — Ambulatory Visit (INDEPENDENT_AMBULATORY_CARE_PROVIDER_SITE_OTHER): Payer: Medicaid Other | Admitting: Infectious Disease

## 2019-08-04 ENCOUNTER — Other Ambulatory Visit: Payer: Self-pay

## 2019-08-04 ENCOUNTER — Encounter: Payer: Self-pay | Admitting: Infectious Disease

## 2019-08-04 VITALS — BP 106/70 | HR 88 | Temp 98.5°F | Wt 124.0 lb

## 2019-08-04 DIAGNOSIS — R1115 Cyclical vomiting syndrome unrelated to migraine: Secondary | ICD-10-CM | POA: Diagnosis not present

## 2019-08-04 DIAGNOSIS — B20 Human immunodeficiency virus [HIV] disease: Secondary | ICD-10-CM | POA: Diagnosis present

## 2019-08-04 DIAGNOSIS — Z952 Presence of prosthetic heart valve: Secondary | ICD-10-CM | POA: Diagnosis not present

## 2019-08-04 NOTE — Progress Notes (Signed)
Subjective:   Chief complaint he is here for follow-up for his HIV and on medications   Patient ID: Brandon [name], male    DOB: 06-25-1965, 55 y.o.   MRN: [id number]  HPI   54 year old Brandon [name] is a highly complicated man with history of  HIV/AIDS and Multi-DRUG RESISTANT virus formerly followed at [facility]. His HIV nadir was  20 when we first met him   He had been on various complicated antiretroviral regimens in the past, with unfortunate GENOTYPIC resistance to all non-nucleoside reverse transcriptase inhibitors and all NRTIs, Resistance to all protease inhibitors with the exception of Prezista which had some activity genotypically,, Resistance to Isentress, and Elvitegravir and  reduced S to dolutegravir having both a 148H and 140S  and with Dual tropic virus.  02/12/2010 phenotype at [facility] showed:  RT: NRTI: ABC, DDI, D4T, AZT, TDF: resistant; 3TC, FTC: susceptible; NNRTI: EFV susceptible; RPV, NVP, ETR, DLV: Resistant; PI: pan-resistant  He had decided nto go back onto [facility] and WAS  referred to [facility].  We  Had  seen him and placed him on a  salvage regimen of Prezista 658m  Twice daily boosted with Norvir 1071mtwice daily, Tivicay twice daily, Combivir twice daily and once daily Viread.  And since then he haD BEEN WITH AN UNDETECTABLE VIRAL LOAD <20 FOR MORE THAN THREE  YEARS  And  Healthy CD4 count.  Unfortunately his summers viral load popped up into the thousands though resistance testing failed to show any new evidence of resistance. I wanted to see him shortly thereafter but he has not been seen in clinic until today when he came in accompanied his wife to her visit.   We were able to re-suppress him and he has been undetectable on a regimen --Tivicay 5032mID --Prezista 600m24mD with  --Norvir 100mg7m -AZT  BID --DESCOVY q daily  He continued  to have problems with nausea and vomiting that he attributes to DRV boosted w RTV  We have  subsequently switched off DRV/RTV BID for Fostemsavir BID along with continuing the remainder of his salvage regimen.  His nausea had dramatically improved and he states that he does not vomit nearly as frequently as he did before on the prior regimen.  He still does suffer the nausea and occasionally does vomit and in fact was in ER recently. He had gastric emptying study that was reportedly normal.  He DOES feel better after he did a diet of bread and other bland foods and is going to experiment with more mild food.   Past Medical History:  Diagnosis Date  . Abdominal pain   . Anemia   . Arthritis   . Back pain 02/13/2016  . Constipation   . Depression   . Diarrhea   . Foot lesion 12/19/2014  . Gallstones   . Gastric AVM   . GERD (gastroesophageal reflux disease)   . GI bleed   . HIV (human immunodeficiency virus infection) (HCC) [place] Hypertension   . IBS (irritable bowel syndrome)   . Infectious colitis   . Interstitial cystitis   . Mechanical heart valve present   . Nausea & vomiting   . Osteopenia 06/02/2018  . Pancreatitis   . 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 Stroke (HCC) [place] Weight loss, unintentional     Past Surgical History:  Procedure Laterality Date  . AORTIC VALVE REPLACEMENT    . CARDIAC SURGERY    .  CHOLECYSTECTOMY  02/12/2012   Procedure: LAPAROSCOPIC CHOLECYSTECTOMY;  Surgeon: Stark Klein, MD;  Location: [place];  Service: General;  Laterality: N/A;  . COLONOSCOPY WITH ESOPHAGOGASTRODUODENOSCOPY (EGD)     with polypectomy  . ESOPHAGOGASTRODUODENOSCOPY N/A 07/24/2012   Procedure: ESOPHAGOGASTRODUODENOSCOPY (EGD);  Surgeon: Beryle Beams, MD;  Location: [facility] ENDOSCOPY;  Service: Endoscopy;  Laterality: N/A;  . KNEE SURGERY    . MULTIPLE EXTRACTIONS WITH ALVEOLOPLASTY N/A 03/13/2018   Procedure: MULTIPLE EXTRACTION;  Surgeon: Diona Browner, DDS;  Location: [place];  Service: Oral Surgery;  Laterality: N/A;    Family History  Problem Relation  Age of Onset  . Hypertension Father   . Prostate cancer Father   . Stomach cancer Father   . Hypertension Sister   . Diabetes Maternal Aunt   . Cancer - Other Cousin   . Parkinson's disease Paternal Aunt       Social History   Socioeconomic History  . Marital status: Married    Spouse name: Not on file  . Number of children: 6  . Years of education: Not on file  . Highest education level: Not on file  Occupational History  . Occupation: disability rep  Tobacco Use  . Smoking status: Former Smoker    Packs/day: 0.10    Years: 20.00    Pack years: 2.00    Types: Cigars    Quit date: 08/02/2017    Years since quitting: 2.0  . Smokeless tobacco: Never Used  Substance and Sexual Activity  . Alcohol use: Not Currently    Alcohol/week: 0.0 standard drinks  . Drug use: Yes    Frequency: 7.0 times per week    Types: Marijuana    Comment: daily 2 joints   . Sexual activity: Yes    Partners: Female    Birth control/protection: Condom  Other Topics Concern  . Not on file  Social History Narrative  . Not on file   Social Determinants of Health   Financial Resource Strain:   . Difficulty of Paying Living Expenses:   Food Insecurity:   . Worried About Charity fundraiser in the Last Year:   . Arboriculturist in the Last Year:   Transportation Needs:   . Film/video editor (Medical):   Marland Kitchen Lack of Transportation (Non-Medical):   Physical Activity:   . Days of Exercise per Week:   . Minutes of Exercise per Session:   Stress:   . Feeling of Stress :   Social Connections:   . Frequency of Communication with Friends and Family:   . Frequency of Social Gatherings with Friends and Family:   . Attends Religious Services:   . Active Member of Clubs or Organizations:   . Attends Archivist Meetings:   Marland Kitchen Marital Status:     Allergies  Allergen Reactions  . Bactrim [Sulfamethoxazole-Trimethoprim]   . Bee Venom Anaphylaxis  . Sulfa Antibiotics Anaphylaxis  .  Truvada [Emtricitabine-Tenofovir Df] Anaphylaxis and Rash    Takes plain tenofovir at home  . Lidoderm [Lidocaine] Other (See Comments)    Reaction unknown  . Raltegravir     resistance  . Ceftriaxone Rash  . Sulfamethoxazole Itching, Other (See Comments) and Rash    Other reaction(s): Hypotension (ALLERGY/intolerance)     Current Outpatient Medications:  .  DESCOVY 200-25 MG tablet, Take 1 tablet by mouth daily., Disp: 30 tablet, Rfl: 5 .  dolutegravir (TIVICAY) 50 MG tablet, Take 1 tablet (50 mg total) by mouth 2 (  two) times daily., Disp: 60 tablet, Rfl: 5 .  EPINEPHrine (EPI-PEN) 0.3 mg/0.3 mL DEVI, Inject 0.3 mLs (0.3 mg total) into the muscle once., Disp: 1 Device, Rfl: 1 .  escitalopram (LEXAPRO) 20 MG tablet, TAKE 1 TABLET BY MOUTH   DAILY (Patient taking differently: Take 20 mg by mouth daily. ), Disp: 30 tablet, Rfl: 1 .  LORazepam (ATIVAN) 1 MG tablet, Take 1 mg by mouth at bedtime as needed for anxiety or sleep., Disp: , Rfl:  .  ondansetron (ZOFRAN-ODT) 8 MG disintegrating tablet, Take 1 tablet (8 mg total) by mouth every 8 (eight) hours as needed for nausea or vomiting., Disp: 30 tablet, Rfl: 6 .  Opium 10 MG/ML (1%) TINC, Take 0.6 mLs (6 mg total) by mouth every 6 (six) hours., Disp: 72 mL, Rfl: 0 .  RUKOBIA 600 MG TB12, Take 1 tablet by mouth every 12 (twelve) hours. (Patient taking differently: Take 600 mg by mouth every 12 (twelve) hours. ), Disp: 60 tablet, Rfl: 5 .  traZODone (DESYREL) 50 MG tablet, Take 50 mg by mouth at bedtime. , Disp: , Rfl:  .  valACYclovir (VALTREX) 1000 MG tablet, TAKE 1 TABLET (1,000 MG TOTAL) BY MOUTH DAILY. (Patient taking differently: Take 1,000 mg by mouth daily. ), Disp: 30 tablet, Rfl: 4 .  warfarin (COUMADIN) 1 MG tablet, Take 1 mg by mouth every Friday. Take as directed with 61m tablet per [facility] on Fridays., Disp: , Rfl:  .  warfarin (COUMADIN) 5 MG tablet, Take 5 mg by mouth daily. , Disp: , Rfl:  .  zidovudine (RETROVIR) 300 MG  tablet, Take 1 tablet (300 mg total) by mouth 2 (two) times daily., Disp: 60 tablet, Rfl: 5 No current facility-administered medications for this visit.  Facility-Administered Medications Ordered in Other Visits:  .  0.9 %  sodium chloride infusion, , Intravenous, Once, HCampbell Riches MD  Review of Systems  Constitutional: Negative for activity change, appetite change, chills, diaphoresis, fatigue, fever and unexpected weight change.  HENT: Negative for congestion, rhinorrhea, sinus pressure, sneezing, sore throat and trouble swallowing.   Eyes: Negative for photophobia and visual disturbance.  Respiratory: Negative for cough, chest tightness, shortness of breath, wheezing and stridor.   Cardiovascular: Negative for chest pain, palpitations and leg swelling.  Gastrointestinal: Positive for nausea and vomiting. Negative for abdominal distention, abdominal pain, anal bleeding, blood in stool, constipation and diarrhea.  Genitourinary: Negative for difficulty urinating, dysuria, flank pain and hematuria.  Musculoskeletal: Negative for arthralgias, back pain, gait problem, joint swelling and myalgias.  Skin: Negative for color change, pallor, rash and wound.  Neurological: Negative for dizziness, tremors, weakness and light-headedness.  Hematological: Negative for adenopathy. Does not bruise/bleed easily.  Psychiatric/Behavioral: Negative for agitation, behavioral problems, confusion, decreased concentration, dysphoric mood and sleep disturbance.       Objective:   Physical Exam Constitutional:      Appearance: He is well-developed.  HENT:     Head: Normocephalic and atraumatic.  Eyes:     Conjunctiva/sclera: Conjunctivae normal.  Cardiovascular:     Rate and Rhythm: Normal rate and regular rhythm.  Pulmonary:     Effort: Pulmonary effort is normal. No respiratory distress.     Breath sounds: No wheezing.  Abdominal:     General: There is no distension.     Palpations: Abdomen  is soft.  Musculoskeletal:        General: No tenderness. Normal range of motion.     Cervical back: Normal range  of motion and neck supple.  Skin:    General: Skin is warm and dry.     Coloration: Skin is not pale.     Findings: No erythema or rash.  Neurological:     General: No focal deficit present.     Mental Status: He is alert and oriented to person, place, and time.  Psychiatric:        Mood and Affect: Mood normal.        Behavior: Behavior normal.        Thought Content: Thought content normal.        Judgment: Judgment normal.           Assessment & Plan:   HIV disease with multidrug resistant virus and patient who did not tolerate protease inhibitors well:   Continue twice daily TIVICAY Continue twice daily AZT Continue twice daily fostemsavir  Continue Descovy  We will recheck labs in 4 months time.   Aortic valve replacement on anticoagulation.  Nausea and vomiting: he is going to experiment with bland diet.

## 2019-11-10 ENCOUNTER — Other Ambulatory Visit: Payer: Self-pay | Admitting: *Deleted

## 2019-11-10 ENCOUNTER — Other Ambulatory Visit: Payer: Self-pay | Admitting: Infectious Disease

## 2019-11-10 DIAGNOSIS — B20 Human immunodeficiency virus [HIV] disease: Secondary | ICD-10-CM

## 2019-11-10 MED ORDER — RUKOBIA 600 MG PO TB12: ORAL_TABLET | ORAL | 5 refills | Status: DC

## 2019-11-10 MED ORDER — DESCOVY 200-25 MG PO TABS: 1.0000 | ORAL_TABLET | Freq: Every day | ORAL | 5 refills | Status: DC

## 2019-11-10 MED ORDER — TIVICAY 50 MG PO TABS: 50.0000 mg | ORAL_TABLET | Freq: Two times a day (BID) | ORAL | 5 refills | Status: DC

## 2019-11-10 MED ORDER — ZIDOVUDINE 300 MG PO TABS: 300.0000 mg | ORAL_TABLET | Freq: Two times a day (BID) | ORAL | 5 refills | Status: DC

## 2019-11-18 ENCOUNTER — Other Ambulatory Visit: Payer: Medicaid Other

## 2019-11-23 ENCOUNTER — Ambulatory Visit: Payer: Medicaid Other | Attending: Internal Medicine

## 2019-11-23 DIAGNOSIS — Z23 Encounter for immunization: Secondary | ICD-10-CM

## 2019-11-23 NOTE — Progress Notes (Signed)
   Covid-19 Vaccination Clinic  Name:  DEANTAE SHACKLETON    MRN: [id number] DOB: 1965-06-27  11/23/2019  Mr. Corsino was observed post Covid-19 immunization for 15 minutes without incident. He was provided with Vaccine Information Sheet and instruction to access the V-Safe system.   Mr. Govan was instructed to call 911 with any severe reactions post vaccine: Marland Kitchen Difficulty breathing  . Swelling of face and throat  . A fast heartbeat  . A bad rash all over body  . Dizziness and weakness

## 2019-12-01 ENCOUNTER — Ambulatory Visit (INDEPENDENT_AMBULATORY_CARE_PROVIDER_SITE_OTHER): Payer: Medicaid Other | Admitting: Infectious Disease

## 2019-12-01 ENCOUNTER — Other Ambulatory Visit: Payer: Self-pay

## 2019-12-01 ENCOUNTER — Other Ambulatory Visit (HOSPITAL_COMMUNITY)
Admission: RE | Admit: 2019-12-01 | Discharge: 2019-12-01 | Disposition: A | Discharge status: Home / Self Care | Payer: Medicaid Other | Source: Ambulatory Visit | Attending: Infectious Disease | Admitting: Infectious Disease

## 2019-12-01 ENCOUNTER — Encounter: Payer: Self-pay | Admitting: Infectious Disease

## 2019-12-01 VITALS — BP 108/70 | HR 78 | Temp 99.1°F | Ht 69.0 in | Wt 121.0 lb

## 2019-12-01 DIAGNOSIS — Z23 Encounter for immunization: Secondary | ICD-10-CM

## 2019-12-01 DIAGNOSIS — Z7901 Long term (current) use of anticoagulants: Secondary | ICD-10-CM | POA: Insufficient documentation

## 2019-12-01 DIAGNOSIS — B20 Human immunodeficiency virus [HIV] disease: Secondary | ICD-10-CM | POA: Diagnosis not present

## 2019-12-01 DIAGNOSIS — R112 Nausea with vomiting, unspecified: Secondary | ICD-10-CM

## 2019-12-01 NOTE — Progress Notes (Signed)
Subjective:   Chief complaint he is here for follow-up for his HIV and on medications, complaining of nausea.  Patient ID: Brandon [name], male    DOB: [birth year], 54 y.o.   MRN: [id number]  HPI   54 year old Brandon [name] is a highly complicated man with history of  HIV/AIDS and Multi-DRUG RESISTANT virus formerly followed at [facility]. His HIV nadir was  20 when we first met him   He had been on various complicated antiretroviral regimens in the past, with unfortunate GENOTYPIC resistance to all non-nucleoside reverse transcriptase inhibitors and all NRTIs, Resistance to all protease inhibitors with the exception of Prezista which had some activity genotypically,, Resistance to Isentress, and Elvitegravir and  reduced S to dolutegravir having both a 148H and 140S  and with Dual tropic virus.  02/12/2010 phenotype at [facility] showed:  RT: NRTI: ABC, DDI, D4T, AZT, TDF: resistant; 3TC, FTC: susceptible; NNRTI: EFV susceptible; RPV, NVP, ETR, DLV: Resistant; PI: pan-resistant  He had decided nto go back onto [facility] and WAS  referred to [facility].  We  Had  seen him and placed him on a  salvage regimen of Prezista 623m  Twice daily boosted with Norvir 10110mtwice daily, Tivicay twice daily, Combivir twice daily and once daily Viread.  And since then he haD BEEN WITH AN UNDETECTABLE VIRAL LOAD <20 FOR MORE THAN THREE  YEARS  And  Healthy CD4 count.  Did have one time where his viral load popped into the thousands but there was no resistance seen and he was able to resuppress on :  --Tivicay 5051mID --Prezista 600m50mD with  --Norvir 100mg8m -AZT  BID --DESCOVY q daily  He continued  to have problems with nausea and vomiting that he attributes to DRV boosted w RTV  We subsequently switched off DRV/RTV BID for Fostemsavir BID along with continuing the remainder of his salvage regimen.  His nausea had dramatically improved and he states that he does not vomit nearly as  frequently as he did before on the prior regimen.  He still does suffer the nausea a    He DID feel better after he did a diet of bread and other bland foods for example when he was eating eggs and toast and preparing to have endoscopy.  He never implemented the eggs and toast regimen but with oatmeal seems to have less nausea also though it does still persist.     Past Medical History:  Diagnosis Date  . Abdominal pain   . Anemia   . Arthritis   . Back pain 02/13/2016  . Constipation   . Depression   . Diarrhea   . Foot lesion 12/19/2014  . Gallstones   . Gastric AVM   . GERD (gastroesophageal reflux disease)   . GI bleed   . HIV (human immunodeficiency virus infection) (HCC) [place] Hypertension   . IBS (irritable bowel syndrome)   . Infectious colitis   . Interstitial cystitis   . Mechanical heart valve present   . Nausea & vomiting   . Osteopenia 06/02/2018  . Pancreatitis   . 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 Stroke (HCC) [place] Weight loss, unintentional     Past Surgical History:  Procedure Laterality Date  . AORTIC VALVE REPLACEMENT    . CARDIAC SURGERY    . CHOLECYSTECTOMY  02/12/2012   Procedure: LAPAROSCOPIC CHOLECYSTECTOMY;  Surgeon: FaeraStark Klein  [facility];  Laterality: N/A;  . COLONOSCOPY WITH ESOPHAGOGASTRODUODENOSCOPY (EGD)     with polypectomy  . ESOPHAGOGASTRODUODENOSCOPY N/A 07/24/2012   Procedure: ESOPHAGOGASTRODUODENOSCOPY (EGD);  Surgeon: Beryle Beams, MD;  Location: [facility] ENDOSCOPY;  Service: Endoscopy;  Laterality: N/A;  . KNEE SURGERY    . MULTIPLE EXTRACTIONS WITH ALVEOLOPLASTY N/A 03/13/2018   Procedure: MULTIPLE EXTRACTION;  Surgeon: Diona Browner, DDS;  Location: [place];  Service: Oral Surgery;  Laterality: N/A;    Family History  Problem Relation Age of Onset  . Hypertension Father   . Prostate cancer Father   . Stomach cancer Father   . Hypertension Sister   . Diabetes Maternal Aunt   .  Cancer - Other Cousin   . Parkinson's disease Paternal Aunt       Social History   Socioeconomic History  . Marital status: Married    Spouse name: Not on file  . Number of children: 6  . Years of education: Not on file  . Highest education level: Not on file  Occupational History  . Occupation: disability rep  Tobacco Use  . Smoking status: Former Smoker    Packs/day: 0.10    Years: 20.00    Pack years: 2.00    Types: Cigars    Quit date: 08/02/2017    Years since quitting: 2.3  . Smokeless tobacco: Never Used  Vaping Use  . Vaping Use: Some days  . Substances: CBD  . Devices: CBD vaping  Substance and Sexual Activity  . Alcohol use: Yes    Alcohol/week: 0.0 standard drinks    Comment: rarely   . Drug use: Yes    Frequency: 7.0 times per week    Types: Marijuana    Comment: daily 2 joints   . Sexual activity: Yes    Partners: Female    Birth control/protection: Condom  Other Topics Concern  . Not on file  Social History Narrative  . Not on file   Social Determinants of Health   Financial Resource Strain:   . Difficulty of Paying Living Expenses: Not on file  Food Insecurity:   . Worried About Charity fundraiser in the Last Year: Not on file  . Ran Out of Food in the Last Year: Not on file  Transportation Needs:   . Lack of Transportation (Medical): Not on file  . Lack of Transportation (Non-Medical): Not on file  Physical Activity:   . Days of Exercise per Week: Not on file  . Minutes of Exercise per Session: Not on file  Stress:   . Feeling of Stress : Not on file  Social Connections:   . Frequency of Communication with Friends and Family: Not on file  . Frequency of Social Gatherings with Friends and Family: Not on file  . Attends Religious Services: Not on file  . Active Member of Clubs or Organizations: Not on file  . Attends Archivist Meetings: Not on file  . Marital Status: Not on file    Allergies  Allergen Reactions  . Bactrim  [Sulfamethoxazole-Trimethoprim]   . Bee Venom Anaphylaxis  . Sulfa Antibiotics Anaphylaxis  . Truvada [Emtricitabine-Tenofovir Df] Anaphylaxis and Rash    Takes plain tenofovir at home  . Lidoderm [Lidocaine] Other (See Comments)    Reaction unknown  . Raltegravir     resistance  . Ceftriaxone Rash  . Sulfamethoxazole Itching, Other (See Comments) and Rash    Other reaction(s): Hypotension (ALLERGY/intolerance)     Current Outpatient Medications:  .  dolutegravir (  TIVICAY) 50 MG tablet, Take 1 tablet (50 mg total) by mouth 2 (two) times daily., Disp: 60 tablet, Rfl: 5 .  emtricitabine-tenofovir AF (DESCOVY) 200-25 MG tablet, Take 1 tablet by mouth daily., Disp: 30 tablet, Rfl: 5 .  EPINEPHrine (EPI-PEN) 0.3 mg/0.3 mL DEVI, Inject 0.3 mLs (0.3 mg total) into the muscle once., Disp: 1 Device, Rfl: 1 .  escitalopram (LEXAPRO) 20 MG tablet, TAKE 1 TABLET BY MOUTH   DAILY (Patient taking differently: Take 20 mg by mouth daily. ), Disp: 30 tablet, Rfl: 1 .  Fostemsavir Tromethamine ER (RUKOBIA) 600 MG TB12, Take 1 tablet by mouth every 12 (twelve) hours., Disp: 60 tablet, Rfl: 5 .  LORazepam (ATIVAN) 1 MG tablet, Take 1 mg by mouth at bedtime as needed for anxiety or sleep., Disp: , Rfl:  .  losartan (COZAAR) 25 MG tablet, Take 25 mg by mouth daily., Disp: , Rfl:  .  metoprolol succinate (TOPROL-XL) 25 MG 24 hr tablet, Take 25 mg by mouth daily., Disp: , Rfl:  .  ondansetron (ZOFRAN-ODT) 8 MG disintegrating tablet, Take 1 tablet (8 mg total) by mouth every 8 (eight) hours as needed for nausea or vomiting., Disp: 30 tablet, Rfl: 6 .  PROMETHEGAN 25 MG suppository, Place 25 mg rectally every 6 (six) hours as needed., Disp: , Rfl:  .  traZODone (DESYREL) 50 MG tablet, Take 50 mg by mouth at bedtime. , Disp: , Rfl:  .  valACYclovir (VALTREX) 1000 MG tablet, TAKE 1 TABLET (1,000 MG TOTAL) BY MOUTH DAILY. (Patient taking differently: Take 1,000 mg by mouth daily. ), Disp: 30 tablet, Rfl: 4 .   warfarin (COUMADIN) 1 MG tablet, Take 1 mg by mouth every Friday. Take as directed with 68m tablet per [facility] on Fridays., Disp: , Rfl:  .  warfarin (COUMADIN) 4 MG tablet, Take up to 1 tablet daily or as [facility], Disp: , Rfl:  .  warfarin (COUMADIN) 5 MG tablet, Take 5 mg by mouth daily. , Disp: , Rfl:  .  Opium 10 MG/ML (1%) TINC, Take 0.6 mLs (6 mg total) by mouth every 6 (six) hours. (Patient not taking: Reported on 12/01/2019), Disp: 72 mL, Rfl: 0 .  zidovudine (RETROVIR) 300 MG tablet, Take 1 tablet (300 mg total) by mouth 2 (two) times daily. (Patient not taking: Reported on 12/01/2019), Disp: 60 tablet, Rfl: 5 No current facility-administered medications for this visit.  Facility-Administered Medications Ordered in Other Visits:  .  0.9 %  sodium chloride infusion, , Intravenous, Once, HCampbell Riches MD  Review of Systems  Constitutional: Negative for activity change, appetite change, chills, diaphoresis, fatigue, fever and unexpected weight change.  HENT: Negative for congestion, rhinorrhea, sinus pressure, sneezing, sore throat and trouble swallowing.   Eyes: Negative for photophobia and visual disturbance.  Respiratory: Negative for cough, chest tightness, shortness of breath, wheezing and stridor.   Cardiovascular: Negative for chest pain, palpitations and leg swelling.  Gastrointestinal: Positive for nausea and vomiting. Negative for abdominal distention, abdominal pain, anal bleeding, blood in stool, constipation and diarrhea.  Genitourinary: Negative for difficulty urinating, dysuria, flank pain and hematuria.  Musculoskeletal: Negative for arthralgias, back pain, gait problem, joint swelling and myalgias.  Skin: Negative for color change, pallor, rash and wound.  Neurological: Negative for dizziness, tremors, weakness and light-headedness.  Hematological: Negative for adenopathy. Does not bruise/bleed easily.  Psychiatric/Behavioral:  Negative for agitation, behavioral problems, confusion, decreased concentration, dysphoric mood and sleep disturbance.  Objective:   Physical Exam Constitutional:      Appearance: He is well-developed.  HENT:     Head: Normocephalic and atraumatic.  Eyes:     Conjunctiva/sclera: Conjunctivae normal.  Cardiovascular:     Rate and Rhythm: Normal rate and regular rhythm.  Pulmonary:     Effort: Pulmonary effort is normal. No respiratory distress.     Breath sounds: No wheezing.  Abdominal:     General: There is no distension.     Palpations: Abdomen is soft.  Musculoskeletal:        General: No tenderness. Normal range of motion.     Cervical back: Normal range of motion and neck supple.  Skin:    General: Skin is warm and dry.     Coloration: Skin is not pale.     Findings: No erythema or rash.  Neurological:     General: No focal deficit present.     Mental Status: He is alert and oriented to person, place, and time.  Psychiatric:        Mood and Affect: Mood normal.        Behavior: Behavior normal.        Thought Content: Thought content normal.        Judgment: Judgment normal.           Assessment & Plan:   HIV disease with multidrug resistant virus and patient who did not tolerate protease inhibitors well:   Continue twice daily TIVICAY Continue twice daily AZT Continue twice daily fostemsavir  Continue Descovy  Check labs today and then he can come back in 6 months.  Counseled him on the fact that there are newer drugs coming including relatively soon we should have Lenacapravir   Aortic valve replacement on anticoagulation.  Nausea and vomiting: Hopefully down the road we may be able to help out by taking for example AZT out of his regimen if this is causing some of his symptoms of nausea or fatigue or exchanging a newer antiviral for fostemsavir as we have had some patients with nausea on this medication.  He himself did not notice having any  worsening nausea with fostemsavir though again he was coming off of his twice daily boosted darunavir  COVID prevention: he has had 3 doses of Pfizer as has his wife

## 2019-12-02 LAB — T-HELPER CELL (CD4) - (RCID CLINIC ONLY)
CD4 % Helper T Cell: 21 % — ABNORMAL LOW (ref 33–65)
CD4 T Cell Abs: 562 /uL (ref 400–1790)

## 2019-12-03 LAB — URINE CYTOLOGY ANCILLARY ONLY
Chlamydia: NEGATIVE
Comment: NEGATIVE
Comment: NORMAL
Neisseria Gonorrhea: NEGATIVE

## 2019-12-05 LAB — COMPLETE METABOLIC PANEL WITH GFR
AG Ratio: 1.8 (calc) (ref 1.0–2.5)
ALT: 20 U/L (ref 9–46)
AST: 21 U/L (ref 10–35)
Albumin: 4 g/dL (ref 3.6–5.1)
Alkaline phosphatase (APISO): 59 U/L (ref 35–144)
BUN/Creatinine Ratio: 9 (calc) (ref 6–22)
BUN: 13 mg/dL (ref 7–25)
CO2: 28 mmol/L (ref 20–32)
Calcium: 9 mg/dL (ref 8.6–10.3)
Chloride: 106 mmol/L (ref 98–110)
Creat: 1.45 mg/dL — ABNORMAL HIGH (ref 0.70–1.33)
GFR, Est African American: 64 mL/min/{1.73_m2} (ref >=60)
GFR, Est Non African American: 55 mL/min/{1.73_m2} — ABNORMAL LOW (ref >=60)
Globulin: 2.2 g/dL (calc) (ref 1.9–3.7)
Glucose, Bld: 72 mg/dL (ref 65–99)
Potassium: 4.5 mmol/L (ref 3.5–5.3)
Sodium: 140 mmol/L (ref 135–146)
Total Bilirubin: 0.6 mg/dL (ref 0.2–1.2)
Total Protein: 6.2 g/dL (ref 6.1–8.1)

## 2019-12-05 LAB — CBC WITH DIFFERENTIAL/PLATELET
Absolute Monocytes: 331 cells/uL (ref 200–950)
Basophils Absolute: 18 cells/uL (ref 0–200)
Basophils Relative: 0.4 %
Eosinophils Absolute: 78 cells/uL (ref 15–500)
Eosinophils Relative: 1.7 %
HCT: 34 % — ABNORMAL LOW (ref 38.5–50.0)
Hemoglobin: 12.1 g/dL — ABNORMAL LOW (ref 13.2–17.1)
Lymphs Abs: 2682 cells/uL (ref 850–3900)
MCH: 39.7 pg — ABNORMAL HIGH (ref 27.0–33.0)
MCHC: 35.6 g/dL (ref 32.0–36.0)
MCV: 111.5 fL — ABNORMAL HIGH (ref 80.0–100.0)
MPV: 12 fL (ref 7.5–12.5)
Monocytes Relative: 7.2 %
Neutro Abs: 1490 cells/uL — ABNORMAL LOW (ref 1500–7800)
Neutrophils Relative %: 32.4 %
Platelets: 149 10*3/uL (ref 140–400)
RBC: 3.05 10*6/uL — ABNORMAL LOW (ref 4.20–5.80)
RDW: 12.6 % (ref 11.0–15.0)
Total Lymphocyte: 58.3 %
WBC: 4.6 10*3/uL (ref 3.8–10.8)

## 2019-12-05 LAB — HIV-1 RNA QUANT-NO REFLEX-BLD
HIV 1 RNA Quant: <20 Copies/mL
HIV-1 RNA Quant, Log: <1.3 Log cps/mL

## 2019-12-05 LAB — RPR: RPR Ser Ql: NONREACTIVE

## 2019-12-06 ENCOUNTER — Ambulatory Visit: Payer: Medicaid Other | Admitting: Infectious Disease

## 2019-12-07 ENCOUNTER — Emergency Department (HOSPITAL_COMMUNITY)
Admission: EM | Admit: 2019-12-07 | Discharge: 2019-12-07 | Disposition: A | Discharge status: Home / Self Care | Payer: Medicaid Other | Attending: Emergency Medicine | Admitting: Emergency Medicine

## 2019-12-07 ENCOUNTER — Other Ambulatory Visit: Payer: Self-pay

## 2019-12-07 ENCOUNTER — Encounter (HOSPITAL_COMMUNITY): Payer: Self-pay | Admitting: Pediatrics

## 2019-12-07 DIAGNOSIS — R197 Diarrhea, unspecified: Secondary | ICD-10-CM | POA: Insufficient documentation

## 2019-12-07 DIAGNOSIS — R112 Nausea with vomiting, unspecified: Secondary | ICD-10-CM | POA: Diagnosis not present

## 2019-12-07 DIAGNOSIS — Z5321 Procedure and treatment not carried out due to patient leaving prior to being seen by health care provider: Secondary | ICD-10-CM | POA: Diagnosis not present

## 2019-12-07 DIAGNOSIS — R109 Unspecified abdominal pain: Secondary | ICD-10-CM | POA: Diagnosis present

## 2019-12-07 LAB — COMPREHENSIVE METABOLIC PANEL
ALT: 30 U/L (ref 0–44)
AST: 42 U/L — ABNORMAL HIGH (ref 15–41)
Albumin: 4.8 g/dL (ref 3.5–5.0)
Alkaline Phosphatase: 73 U/L (ref 38–126)
Anion gap: 14 (ref 5–15)
BUN: 32 mg/dL — ABNORMAL HIGH (ref 6–20)
CO2: 26 mmol/L (ref 22–32)
Calcium: 9.8 mg/dL (ref 8.9–10.3)
Chloride: 99 mmol/L (ref 98–111)
Creatinine, Ser: 1.77 mg/dL — ABNORMAL HIGH (ref 0.61–1.24)
GFR calc Af Amer: 50 mL/min — ABNORMAL LOW (ref >60)
GFR calc non Af Amer: 43 mL/min — ABNORMAL LOW (ref >60)
Glucose, Bld: 148 mg/dL — ABNORMAL HIGH (ref 70–99)
Potassium: 3.5 mmol/L (ref 3.5–5.1)
Sodium: 139 mmol/L (ref 135–145)
Total Bilirubin: 1.5 mg/dL — ABNORMAL HIGH (ref 0.3–1.2)
Total Protein: 7.8 g/dL (ref 6.5–8.1)

## 2019-12-07 LAB — CBC
HCT: 41.8 % (ref 39.0–52.0)
Hemoglobin: 14.5 g/dL (ref 13.0–17.0)
MCH: 39 pg — ABNORMAL HIGH (ref 26.0–34.0)
MCHC: 34.7 g/dL (ref 30.0–36.0)
MCV: 112.4 fL — ABNORMAL HIGH (ref 80.0–100.0)
Platelets: 213 10*3/uL (ref 150–400)
RBC: 3.72 MIL/uL — ABNORMAL LOW (ref 4.22–5.81)
RDW: 12.9 % (ref 11.5–15.5)
WBC: 8.2 10*3/uL (ref 4.0–10.5)
nRBC: 0 % (ref 0.0–0.2)

## 2019-12-07 LAB — LIPASE, BLOOD: Lipase: 25 U/L (ref 11–51)

## 2019-12-07 MED ORDER — ONDANSETRON 4 MG PO TBDP: 4.0000 mg | ORAL_TABLET | Freq: Once | ORAL | Status: DC | PRN

## 2019-12-07 NOTE — ED Notes (Signed)
Called pt x3 for vitals, no response. 

## 2019-12-07 NOTE — ED Notes (Signed)
Pt name called for vitals, no response 

## 2019-12-07 NOTE — ED Triage Notes (Signed)
C/o abdominal pain along w/ NVD.

## 2019-12-13 ENCOUNTER — Other Ambulatory Visit: Payer: Self-pay | Admitting: Infectious Disease

## 2019-12-13 DIAGNOSIS — R1115 Cyclical vomiting syndrome unrelated to migraine: Secondary | ICD-10-CM

## 2019-12-30 ENCOUNTER — Encounter (HOSPITAL_COMMUNITY): Payer: Self-pay

## 2019-12-30 ENCOUNTER — Other Ambulatory Visit: Payer: Self-pay

## 2019-12-30 DIAGNOSIS — R1013 Epigastric pain: Secondary | ICD-10-CM | POA: Diagnosis not present

## 2019-12-30 DIAGNOSIS — R112 Nausea with vomiting, unspecified: Secondary | ICD-10-CM | POA: Insufficient documentation

## 2019-12-30 DIAGNOSIS — I10 Essential (primary) hypertension: Secondary | ICD-10-CM | POA: Diagnosis not present

## 2019-12-30 DIAGNOSIS — Z7901 Long term (current) use of anticoagulants: Secondary | ICD-10-CM | POA: Insufficient documentation

## 2019-12-30 DIAGNOSIS — Z87891 Personal history of nicotine dependence: Secondary | ICD-10-CM | POA: Insufficient documentation

## 2019-12-30 DIAGNOSIS — Z79899 Other long term (current) drug therapy: Secondary | ICD-10-CM | POA: Diagnosis not present

## 2019-12-30 NOTE — ED Triage Notes (Signed)
Patient c/o abdominal pain and N/V//d x 2 days.

## 2019-12-30 NOTE — ED Notes (Signed)
Per PCP, history of pancreatitis, HIV-complaining of uncontrolled abdominal pain and nausea-needs GI work up

## 2019-12-30 NOTE — ED Notes (Signed)
Patient refused Zofran ODT when offered in triage. States, "it does not do any good."

## 2019-12-31 ENCOUNTER — Encounter (HOSPITAL_COMMUNITY): Payer: Self-pay | Admitting: Emergency Medicine

## 2019-12-31 ENCOUNTER — Emergency Department (HOSPITAL_COMMUNITY)
Admission: EM | Admit: 2019-12-31 | Discharge: 2019-12-31 | Disposition: A | Discharge status: Home / Self Care | Payer: Medicaid Other | Attending: Emergency Medicine | Admitting: Emergency Medicine

## 2019-12-31 DIAGNOSIS — R112 Nausea with vomiting, unspecified: Secondary | ICD-10-CM

## 2019-12-31 LAB — CBC
HCT: 36.8 % — ABNORMAL LOW (ref 39.0–52.0)
Hemoglobin: 13.3 g/dL (ref 13.0–17.0)
MCH: 39.2 pg — ABNORMAL HIGH (ref 26.0–34.0)
MCHC: 36.1 g/dL — ABNORMAL HIGH (ref 30.0–36.0)
MCV: 108.6 fL — ABNORMAL HIGH (ref 80.0–100.0)
Platelets: 182 10*3/uL (ref 150–400)
RBC: 3.39 MIL/uL — ABNORMAL LOW (ref 4.22–5.81)
RDW: 12.8 % (ref 11.5–15.5)
WBC: 6.5 10*3/uL (ref 4.0–10.5)
nRBC: 0 % (ref 0.0–0.2)

## 2019-12-31 LAB — URINALYSIS, ROUTINE W REFLEX MICROSCOPIC
Bilirubin Urine: NEGATIVE
Glucose, UA: 50 mg/dL — AB
Ketones, ur: 5 mg/dL — AB
Leukocytes,Ua: NEGATIVE
Nitrite: NEGATIVE
Protein, ur: 100 mg/dL — AB
Specific Gravity, Urine: 1.02 (ref 1.005–1.030)
pH: 6 (ref 5.0–8.0)

## 2019-12-31 LAB — COMPREHENSIVE METABOLIC PANEL
ALT: 39 U/L (ref 0–44)
AST: 30 U/L (ref 15–41)
Albumin: 5.1 g/dL — ABNORMAL HIGH (ref 3.5–5.0)
Alkaline Phosphatase: 75 U/L (ref 38–126)
Anion gap: 13 (ref 5–15)
BUN: 14 mg/dL (ref 6–20)
CO2: 25 mmol/L (ref 22–32)
Calcium: 9.8 mg/dL (ref 8.9–10.3)
Chloride: 102 mmol/L (ref 98–111)
Creatinine, Ser: 1.11 mg/dL (ref 0.61–1.24)
GFR, Estimated: >60 mL/min (ref >60)
Glucose, Bld: 142 mg/dL — ABNORMAL HIGH (ref 70–99)
Potassium: 3.5 mmol/L (ref 3.5–5.1)
Sodium: 140 mmol/L (ref 135–145)
Total Bilirubin: 1.1 mg/dL (ref 0.3–1.2)
Total Protein: 7.8 g/dL (ref 6.5–8.1)

## 2019-12-31 LAB — PROTIME-INR
INR: 4.3 (ref 0.8–1.2)
Prothrombin Time: 40.3 seconds — ABNORMAL HIGH (ref 11.4–15.2)

## 2019-12-31 LAB — LIPASE, BLOOD: Lipase: 36 U/L (ref 11–51)

## 2019-12-31 MED ORDER — KETOROLAC TROMETHAMINE 30 MG/ML IJ SOLN
30.0000 mg | Freq: Once | INTRAMUSCULAR | Status: AC
  Administered 2019-12-31: 30 mg via INTRAVENOUS
  Filled 2019-12-31: qty 1

## 2019-12-31 MED ORDER — SODIUM CHLORIDE 0.9 % IV BOLUS
1000.0000 mL | Freq: Once | INTRAVENOUS | Status: AC
  Administered 2019-12-31: 1000 mL via INTRAVENOUS

## 2019-12-31 MED ORDER — HALOPERIDOL LACTATE 5 MG/ML IJ SOLN
5.0000 mg | Freq: Once | INTRAMUSCULAR | Status: AC
  Administered 2019-12-31: 5 mg via INTRAVENOUS
  Filled 2019-12-31: qty 1

## 2019-12-31 NOTE — ED Provider Notes (Signed)
[facility] DEPT Provider Note   CSN: [id number] Arrival date & time: 12/30/19  1722     History Chief Complaint  Patient presents with  . Emesis  . Abdominal Pain    Brandon [name] is a 54 y.o. male.  The history is provided by the patient.  Emesis Severity:  Moderate Duration:  2 days Timing:  Intermittent Quality:  Stomach contents Progression:  Unchanged Chronicity:  Recurrent Recent urination:  Normal Relieved by:  Nothing Worsened by:  Nothing Ineffective treatments:  None tried Associated symptoms: abdominal pain   Associated symptoms: no arthralgias, no chills, no cough, no diarrhea and no fever   Abdominal Pain Pain location:  Epigastric Pain quality: cramping   Pain radiates to:  Does not radiate Pain severity:  Severe Onset quality:  Gradual Timing:  Constant Progression:  Unchanged Chronicity:  Recurrent Context: not awakening from sleep, not diet changes, not eating and not laxative use   Relieved by:  Nothing Worsened by:  Nothing Ineffective treatments:  None tried Associated symptoms: nausea and vomiting   Associated symptoms: no anorexia, no chest pain, no chills, no cough, no diarrhea, no dysuria and no fever   Risk factors: has not had multiple surgeries   Patient with HIV, mechanical AVR and IBS presents with recurrence of nausea vomiting and abdominal pain.  Patient reports daily nausea for several years but for the last 2 days has had vomiting and associated epigastric pain, he states these episodes used to occur every 4 months but in the last few years are occurring with greater frequency.  He stated that his home zofran is not working.  No f/c/r.  No CP, no SOB.  No diarrhea. No urinary symptoms.        Past Medical History:  Diagnosis Date  . Abdominal pain   . Anemia   . Arthritis   . Back pain 02/13/2016  . Constipation   . Depression   . Diarrhea   . Foot lesion 12/19/2014  . Gallstones   .  Gastric AVM   . GERD (gastroesophageal reflux disease)   . GI bleed   . HIV (human immunodeficiency virus infection) ([place])   . Hypertension   . IBS (irritable bowel syndrome)   . Infectious colitis   . Interstitial cystitis   . Mechanical heart valve present   . Nausea & vomiting   . Osteopenia 06/02/2018  . Pancreatitis   . 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 Stroke ([place])   . Weight loss, unintentional     Patient Active Problem List   Diagnosis Date Noted  . Encounter for long-term (current) use of medications 04/05/2019  . Osteopenia 06/02/2018  . HIV disease ([place]) 11/28/2017  . Back pain 02/13/2016  . Foot lesion 12/19/2014  . 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 Thumb pain 03/31/2014  . Left hand weakness 03/31/2014  . Opiate dependence ([place]) 03/31/2014  . Major depression, recurrent, chronic ([place]) 03/31/2014  . Cyclic vomiting syndrome 03/31/2014  . Alleged drug diversion 01/31/2014  . Protein-calorie malnutrition, severe ([place]) 08/24/2013  . Diarrhea 08/23/2013  . Gastroenteritis 08/05/2013  . Depression 03/17/2013  . Sinus congestion 03/17/2013  . Odynophagia 07/24/2012  . Persistent vomiting 07/24/2012  . Candida esophagitis ([place]) 07/23/2012  . Intractable nausea and vomiting 07/23/2012  . Adjustment disorder with mixed anxiety and depressed mood 05/14/2012  . Genital herpes 05/14/2012  . Long term (current) use of anticoagulants 05/14/2012  . cerebral artey occ 05/14/2012  .  Unspecified cerebral artery occlusion with cerebral infarction 05/14/2012  . Chronic pancreatitis ([place]) 02/20/2012  . Hypokalemia 02/20/2012  . Acute pancreatitis 02/09/2012  . Nausea & vomiting 02/09/2012  . Abdominal pain 02/09/2012  . Anemia 02/09/2012  . S/P AVR (aortic valve replacement) 02/09/2012  . Septic arthritis of knee ([place]) 02/09/2012  . Warfarin-induced coagulopathy ([place]) 02/09/2012  . AIDS ([place]) 02/09/2012    Past Surgical History:   Procedure Laterality Date  . AORTIC VALVE REPLACEMENT    . CARDIAC SURGERY    . CHOLECYSTECTOMY  02/12/2012   Procedure: LAPAROSCOPIC CHOLECYSTECTOMY;  Surgeon: Stark Klein, MD;  Location: [facility];  Service: General;  Laterality: N/A;  . COLONOSCOPY WITH ESOPHAGOGASTRODUODENOSCOPY (EGD)     with polypectomy  . ESOPHAGOGASTRODUODENOSCOPY N/A 07/24/2012   Procedure: ESOPHAGOGASTRODUODENOSCOPY (EGD);  Surgeon: Beryle Beams, MD;  Location[facility] ENDOSCOPY;  Service: Endoscopy;  Laterality: N/A;  . KNEE SURGERY    . MULTIPLE EXTRACTIONS WITH ALVEOLOPLASTY N/A 03/13/2018   Procedure: MULTIPLE EXTRACTION;  Surgeon: Diona Browner, DDS;  Location: [facility];  Service: Oral Surgery;  Laterality: N/A;       Family History  Problem Relation Age of Onset  . Hypertension Father   . Prostate cancer Father   . Stomach cancer Father   . Hypertension Sister   . Diabetes Maternal Aunt   . Cancer - Other Cousin   . Parkinson's disease Paternal Aunt     Social History   Tobacco Use  . Smoking status: Former Smoker    Packs/day: 0.10    Years: 20.00    Pack years: 2.00    Types: Cigars    Quit date: 08/02/2017    Years since quitting: 2.4  . Smokeless tobacco: Never Used  Vaping Use  . Vaping Use: Some days  . Substances: CBD  . Devices: CBD vaping  Substance Use Topics  . Alcohol use: Yes    Alcohol/week: 0.0 standard drinks    Comment: rarely   . Drug use: Yes    Frequency: 7.0 times per week    Types: Marijuana    Home Medications Prior to Admission medications   Medication Sig Start Date End Date Taking? Authorizing Provider  dolutegravir (TIVICAY) 50 MG tablet Take 1 tablet (50 mg total) by mouth 2 (two) times daily. 11/10/19   Truman Hayward, MD  emtricitabine-tenofovir AF (DESCOVY) 200-25 MG tablet Take 1 tablet by mouth daily. 11/10/19   Truman Hayward, MD  EPINEPHrine (EPI-PEN) 0.3 mg/0.3 mL DEVI Inject 0.3 mLs (0.3 mg total) into the muscle once. 08/20/12   Truman Hayward, MD  escitalopram (LEXAPRO) 20 MG tablet TAKE 1 TABLET BY MOUTH   DAILY Patient taking differently: Take 20 mg by mouth daily.  02/14/16   Truman Hayward, MD  Fostemsavir Tromethamine ER (RUKOBIA) 600 MG TB12 Take 1 tablet by mouth every 12 (twelve) hours. 11/10/19   Truman Hayward, MD  LORazepam (ATIVAN) 1 MG tablet Take 1 mg by mouth at bedtime as needed for anxiety or sleep.    [provider]  losartan (COZAAR) 25 MG tablet Take 25 mg by mouth daily. 09/24/19   [provider]  metoprolol succinate (TOPROL-XL) 25 MG 24 hr tablet Take 25 mg by mouth daily. 11/18/19   [provider]  ondansetron (ZOFRAN-ODT) 8 MG disintegrating tablet Take 1 tablet (8 mg total) by mouth every 8 (eight) hours as needed for nausea or vomiting. 12/13/19   Tommy Medal,  Lavell Islam, MD  Opium 10 MG/ML (1%) TINC Take 0.6 mLs (6 mg total) by mouth every 6 (six) hours. Patient not taking: Reported on 12/01/2019 07/25/16   Tommy Medal, Lavell Islam, MD  PROMETHEGAN 25 MG suppository Place 25 mg rectally every 6 (six) hours as needed. 10/25/19   [provider]  traZODone (DESYREL) 50 MG tablet Take 50 mg by mouth at bedtime.  12/11/18   [provider]  valACYclovir (VALTREX) 1000 MG tablet TAKE 1 TABLET (1,000 MG TOTAL) BY MOUTH DAILY. Patient taking differently: Take 1,000 mg by mouth daily.  02/07/16   Truman Hayward, MD  warfarin (COUMADIN) 1 MG tablet Take 1 mg by mouth every Friday. Take as directed with 5mg  tablet per [facility] on Fridays. 03/24/14   [provider]  warfarin (COUMADIN) 4 MG tablet Take up to 1 tablet daily or as directed by the [facility]. 10/22/19   [provider]  warfarin (COUMADIN) 5 MG tablet Take 5 mg by mouth daily.     [provider]  zidovudine (RETROVIR) 300 MG tablet Take 1 tablet (300 mg total) by mouth 2 (two) times daily. Patient not taking: Reported on 12/01/2019 11/10/19   Tommy Medal,  Lavell Islam, MD    Allergies    Bactrim [sulfamethoxazole-trimethoprim], Bee venom, Sulfa antibiotics, Truvada [emtricitabine-tenofovir df], Lidoderm [lidocaine], Raltegravir, Ceftriaxone, and Sulfamethoxazole  Review of Systems   Review of Systems  Constitutional: Negative for chills and fever.  HENT: Negative for congestion.   Eyes: Negative for visual disturbance.  Respiratory: Negative for cough.   Cardiovascular: Negative for chest pain.  Gastrointestinal: Positive for abdominal pain, nausea and vomiting. Negative for anorexia and diarrhea.  Genitourinary: Negative for dysuria.  Musculoskeletal: Negative for arthralgias.  Skin: Negative for rash.  Neurological: Negative for dizziness.  Psychiatric/Behavioral: Negative for agitation.  All other systems reviewed and are negative.   Physical Exam Updated Vital Signs BP (!) 189/88 (BP Location: Left Arm)   Pulse (!) 110   Temp 99.5 F (37.5 C)   Resp 16   Ht 5\' 8"  (1.727 m)   Wt 54.4 kg   SpO2 100%   BMI 18.25 kg/m   Physical Exam Vitals and nursing note reviewed.  Constitutional:      General: He is not in acute distress.    Appearance: Normal appearance.  HENT:     Head: Normocephalic and atraumatic.     Nose: Nose normal.  Eyes:     Conjunctiva/sclera: Conjunctivae normal.     Pupils: Pupils are equal, round, and reactive to light.  Cardiovascular:     Rate and Rhythm: Normal rate and regular rhythm.     Pulses: Normal pulses.     Heart sounds: Normal heart sounds.  Pulmonary:     Effort: Pulmonary effort is normal.     Breath sounds: Normal breath sounds.  Abdominal:     General: Abdomen is flat. Bowel sounds are normal.     Palpations: Abdomen is soft.     Tenderness: There is no abdominal tenderness. There is no guarding or rebound.  Musculoskeletal:        General: Normal range of motion.     Cervical back: Normal range of motion and neck supple.  Skin:    General: Skin is warm and dry.      Capillary Refill: Capillary refill takes less than 2 seconds.  Neurological:     General: No focal deficit present.  Mental Status: He is alert and oriented to person, place, and time.     Deep Tendon Reflexes: Reflexes normal.  Psychiatric:        Mood and Affect: Mood normal.        Behavior: Behavior normal.     ED Results / Procedures / Treatments   Labs (all labs ordered are listed, but only abnormal results are displayed) Labs Reviewed  LIPASE, BLOOD  COMPREHENSIVE METABOLIC PANEL  CBC  URINALYSIS, ROUTINE W REFLEX MICROSCOPIC  PROTIME-INR    EKG None  Radiology No results found.  Procedures Procedures (including critical care time)  Medications Ordered in ED Medications - No data to display  ED Course  I have reviewed the triage vital signs and the nursing notes.  Pertinent labs & imaging results that were available during my care of the patient were reviewed by me and considered in my medical decision making (see chart for details).   PO challenged successfully in the ED. Symptoms have resolved.  Exam and vitals and labs are benign and reassuring.  I do not believe imaging is indicated at this time.    Brandon [name] was evaluated in Emergency Department on 12/31/2019 for the symptoms described in the history of present illness. He was evaluated in the context of the global COVID-19 pandemic, which necessitated consideration that the patient might be at risk for infection with the SARS-CoV-2 virus that causes COVID-19. Institutional protocols and algorithms that pertain to the evaluation of patients at risk for COVID-19 are in a state of rapid change based on information released by regulatory bodies including the CDC and federal and state organizations. These policies and algorithms were followed during the patient's care in the ED.  Final Clinical Impression(s) / ED Diagnoses  Return for intractable cough, coughing up blood,fevers >100.4 unrelieved by  medication, shortness of breath, intractable vomiting, chest pain, shortness of breath, weakness,numbness, changes in speech, facial asymmetry,abdominal pain, passing out,Inability to tolerate liquids or food, cough, altered mental status or any concerns. No signs of systemic illness or infection. The patient is nontoxic-appearing on exam and vital signs are within normal limits.   I have reviewed the triage vital signs and the nursing notes. Pertinent labs &imaging results that were available during my care of the patient were reviewed by me and considered in my medical decision making (see chart for details).After history, exam, and medical workup I feel the patient has beenappropriately medically screened and is safe for discharge home. Pertinent diagnoses were discussed with the patient. Patient was given return precautions.       Davielle Lingelbach, MD 12/31/19 [phone]

## 2019-12-31 NOTE — ED Notes (Signed)
Pt c/o uncontrollable n/v since yesterday . Pt denies diarrhea

## 2019-12-31 NOTE — ED Notes (Signed)
This Probation officer gave patient w

## 2019-12-31 NOTE — ED Notes (Signed)
Pt states that he is unable to provide a urine sample at this time but that he will give [place] a sample as soon as he is able

## 2019-12-31 NOTE — ED Notes (Signed)
This Probation officer gave patient ice water. Pt was able to successfully PO challenge and keep water down.

## 2020-01-01 LAB — URINE CULTURE

## 2020-04-18 ENCOUNTER — Other Ambulatory Visit: Payer: Self-pay | Admitting: Infectious Disease

## 2020-04-18 DIAGNOSIS — B20 Human immunodeficiency virus [HIV] disease: Secondary | ICD-10-CM

## 2020-04-18 MED ORDER — TIVICAY 50 MG PO TABS: 50.0000 mg | ORAL_TABLET | Freq: Two times a day (BID) | ORAL | 5 refills | Status: DC

## 2020-04-18 MED ORDER — RUKOBIA 600 MG PO TB12: ORAL_TABLET | ORAL | 5 refills | Status: DC

## 2020-05-17 ENCOUNTER — Ambulatory Visit (INDEPENDENT_AMBULATORY_CARE_PROVIDER_SITE_OTHER): Payer: Medicaid Other | Admitting: Infectious Diseases

## 2020-05-17 ENCOUNTER — Other Ambulatory Visit: Payer: Medicaid Other

## 2020-05-17 ENCOUNTER — Other Ambulatory Visit: Payer: Self-pay

## 2020-05-17 VITALS — BP 198/99 | HR 77

## 2020-05-17 DIAGNOSIS — B20 Human immunodeficiency virus [HIV] disease: Secondary | ICD-10-CM

## 2020-05-17 DIAGNOSIS — R112 Nausea with vomiting, unspecified: Secondary | ICD-10-CM

## 2020-05-17 DIAGNOSIS — Z7901 Long term (current) use of anticoagulants: Secondary | ICD-10-CM

## 2020-05-17 NOTE — Progress Notes (Signed)
ID Brief Note  Patient presented as a walk in with reported complains of Nausea and vomiting per nursing staff and was requesting for IV fluids.   When I went to evaluate him he was reluctant to speak and was asking if we could do IV fluids at the clinic. I spoke to [place] who said that she had already told the patient that we were unable to do IVF and he would need to go to the ED for it. I conveyed same thing to him after that he was visibly frustrated and angry and was ready to leave.   I asked him about the reason about the clinic visit. He said " I am frustrated right now.  I just want to get my regular labs done by Dr Tommy Medal and go back"  Attempted to evaluate the patient multiple times but he did not cooperate to talk or examine him,  Told RN to get his HIV labs per his request and advised to go to ED for IV fluids given reported h/o nausea/vomiting and possibly dehydration per nursing staff.

## 2020-05-17 NOTE — Progress Notes (Signed)
Walk in:  Patient walked into clinic requesting IV fluids for his nausea and vomiting.  He stated he has been throwing up for at least a week. He is cold and pale with excessive  belching present.  His vital signs were taken.  Patient was advised to go to the ED for an evaluation.  He refused saying he was not going over there to sit 7 horus. AI informed him that would be the only way to receive IV fluids.  He was then placed on the provider schedule for an assessment .  Laverle Patter, RN

## 2020-05-18 LAB — T-HELPER CELL (CD4) - (RCID CLINIC ONLY)
CD4 % Helper T Cell: 18 % — ABNORMAL LOW (ref 33–65)
CD4 T Cell Abs: 115 /uL — ABNORMAL LOW (ref 400–1790)

## 2020-05-19 LAB — LIPID PANEL
Cholesterol: 163 mg/dL (ref <200)
HDL: 59 mg/dL (ref >=40)
LDL Cholesterol (Calc): 87 mg/dL (calc)
Non-HDL Cholesterol (Calc): 104 mg/dL (calc) (ref <130)
Total CHOL/HDL Ratio: 2.8 (calc) (ref <5.0)
Triglycerides: 80 mg/dL (ref <150)

## 2020-05-19 LAB — CBC WITH DIFFERENTIAL/PLATELET
Absolute Monocytes: 167 cells/uL — ABNORMAL LOW (ref 200–950)
Basophils Absolute: 12 cells/uL (ref 0–200)
Basophils Relative: 0.2 %
Eosinophils Absolute: 0 cells/uL — ABNORMAL LOW (ref 15–500)
Eosinophils Relative: 0 %
HCT: 35.2 % — ABNORMAL LOW (ref 38.5–50.0)
Hemoglobin: 12.5 g/dL — ABNORMAL LOW (ref 13.2–17.1)
Lymphs Abs: 595 cells/uL — ABNORMAL LOW (ref 850–3900)
MCH: 39.6 pg — ABNORMAL HIGH (ref 27.0–33.0)
MCHC: 35.5 g/dL (ref 32.0–36.0)
MCV: 111.4 fL — ABNORMAL HIGH (ref 80.0–100.0)
MPV: 11.3 fL (ref 7.5–12.5)
Monocytes Relative: 2.7 %
Neutro Abs: 5425 cells/uL (ref 1500–7800)
Neutrophils Relative %: 87.5 %
Platelets: 226 10*3/uL (ref 140–400)
RBC: 3.16 10*6/uL — ABNORMAL LOW (ref 4.20–5.80)
RDW: 12.6 % (ref 11.0–15.0)
Total Lymphocyte: 9.6 %
WBC: 6.2 10*3/uL (ref 3.8–10.8)

## 2020-05-19 LAB — COMPLETE METABOLIC PANEL WITH GFR
AG Ratio: 1.8 (calc) (ref 1.0–2.5)
ALT: 25 U/L (ref 9–46)
AST: 33 U/L (ref 10–35)
Albumin: 4.6 g/dL (ref 3.6–5.1)
Alkaline phosphatase (APISO): 70 U/L (ref 35–144)
BUN: 11 mg/dL (ref 7–25)
CO2: 26 mmol/L (ref 20–32)
Calcium: 9.9 mg/dL (ref 8.6–10.3)
Chloride: 101 mmol/L (ref 98–110)
Creat: 1.03 mg/dL (ref 0.70–1.33)
GFR, Est African American: 96 mL/min/{1.73_m2} (ref >=60)
GFR, Est Non African American: 83 mL/min/{1.73_m2} (ref >=60)
Globulin: 2.5 g/dL (calc) (ref 1.9–3.7)
Glucose, Bld: 131 mg/dL — ABNORMAL HIGH (ref 65–99)
Potassium: 3.9 mmol/L (ref 3.5–5.3)
Sodium: 141 mmol/L (ref 135–146)
Total Bilirubin: 1.2 mg/dL (ref 0.2–1.2)
Total Protein: 7.1 g/dL (ref 6.1–8.1)

## 2020-05-19 LAB — RPR: RPR Ser Ql: NONREACTIVE

## 2020-05-19 LAB — HIV-1 RNA QUANT-NO REFLEX-BLD
HIV 1 RNA Quant: <20 Copies/mL — ABNORMAL HIGH
HIV-1 RNA Quant, Log: <1.3 Log cps/mL — ABNORMAL HIGH

## 2020-05-22 ENCOUNTER — Telehealth: Payer: Self-pay

## 2020-05-22 NOTE — Telephone Encounter (Signed)
Relayed message to patient. Patient does not have any questions at this time.

## 2020-05-22 NOTE — Telephone Encounter (Signed)
-----   Message from Truman Hayward, MD sent at 05/22/2020 11:53 AM EDT ----- Virus suppressed. His CD4 came down quite a bit. I wonder if he had a recent illness? Will repeat when I see him in clinic

## 2020-05-31 ENCOUNTER — Encounter: Payer: Self-pay | Admitting: Infectious Disease

## 2020-05-31 ENCOUNTER — Ambulatory Visit (INDEPENDENT_AMBULATORY_CARE_PROVIDER_SITE_OTHER): Payer: Medicaid Other | Admitting: Infectious Disease

## 2020-05-31 ENCOUNTER — Other Ambulatory Visit: Payer: Self-pay

## 2020-05-31 VITALS — BP 114/72 | HR 99 | Temp 98.3°F | Wt 127.0 lb

## 2020-05-31 DIAGNOSIS — D72810 Lymphocytopenia: Secondary | ICD-10-CM | POA: Diagnosis not present

## 2020-05-31 DIAGNOSIS — R112 Nausea with vomiting, unspecified: Secondary | ICD-10-CM

## 2020-05-31 DIAGNOSIS — I10 Essential (primary) hypertension: Secondary | ICD-10-CM | POA: Diagnosis not present

## 2020-05-31 DIAGNOSIS — Z952 Presence of prosthetic heart valve: Secondary | ICD-10-CM | POA: Diagnosis not present

## 2020-05-31 DIAGNOSIS — B20 Human immunodeficiency virus [HIV] disease: Secondary | ICD-10-CM

## 2020-05-31 HISTORY — DX: Lymphocytopenia: D72.810

## 2020-05-31 NOTE — Progress Notes (Signed)
Subjective:   Chief complaint he is here for follow-up for his HIV and on medications, complaining of nausea and also very much concerned about his CD4 count having dropped  Patient ID: Brandon [name], male    DOB: 11/08/1965, 55 y.o.   MRN: [id number]  HPI   55 year old Brandon [name] is a highly complicated man with history of  HIV/AIDS and Multi-DRUG RESISTANT virus formerly followed at [facility]. His HIV nadir was  20 when we first met him   He had been on various complicated antiretroviral regimens in the past, with unfortunate GENOTYPIC resistance to all non-nucleoside reverse transcriptase inhibitors and all NRTIs, Resistance to all protease inhibitors with the exception of Prezista which had some activity genotypically,, Resistance to Isentress, and Elvitegravir and  reduced S to dolutegravir having both a 148H and 140S  and with Dual tropic virus.  02/12/2010 phenotype at [facility] showed:  RT: NRTI: ABC, DDI, D4T, AZT, TDF: resistant; 3TC, FTC: susceptible; NNRTI: EFV susceptible; RPV, NVP, ETR, DLV: Resistant; PI: pan-resistant  He had decided nto go back onto [facility] and WAS  referred to [facility].  We  Had  seen him and placed him on a  salvage regimen of Prezista 632m  Twice daily boosted with Norvir 1095mtwice daily, Tivicay twice daily, Combivir twice daily and once daily Viread.  And since then he haD BEEN WITH AN UNDETECTABLE VIRAL LOAD <20 FOR MORE THAN THREE  YEARS  And  Healthy CD4 count.  Did have one time where his viral load popped into the thousands but there was no resistance seen and he was able to resuppress on :  --Tivicay 5011mID --Prezista 600m3mD with  --Norvir 100mg70m -AZT  BID --DESCOVY q daily  He continued  to have problems with nausea and vomiting that he attributes to DRV boosted w RTV  We subsequently switched off DRV/RTV BID for Fostemsavir BID along with continuing the remainder of his salvage regimen.  His nausea had  dramatically improved and he states that he does not vomit nearly as frequently as he did before on the prior regimen.  He still does suffer the nausea though and this is a known side effect with his new drug.  He was very bothered by the fact that his CD4 count had dropped.  Noted that his absolute lymphocyte count itself it dropped to 500 so I suspect some type of illness drop to both the absolute lymphocyte count and his CD4 as his viral load remains undetectable.  He was concerned that this might have to do with him having taken his medications with milk with his cereal.  I doubt that this is an issue since his virus remains suppressed.  I did review the data around chelation of newer integrase strand transfer inhibitors and calcium and magnesium and iron with him.       Past Medical History:  Diagnosis Date  . Abdominal pain   . Anemia   . Arthritis   . Back pain 02/13/2016  . Constipation   . Depression   . Diarrhea   . Foot lesion 12/19/2014  . Gallstones   . Gastric AVM   . GERD (gastroesophageal reflux disease)   . GI bleed   . HIV (human immunodeficiency virus infection) (HCC) [place] Hypertension   . IBS (irritable bowel syndrome)   . Infectious colitis   . Interstitial cystitis   . Mechanical heart valve present   . Nausea &  vomiting   . Osteopenia 06/02/2018  . Pancreatitis   . 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 Stroke ([place])   . Weight loss, unintentional     Past Surgical History:  Procedure Laterality Date  . AORTIC VALVE REPLACEMENT    . CARDIAC SURGERY    . CHOLECYSTECTOMY  02/12/2012   Procedure: LAPAROSCOPIC CHOLECYSTECTOMY;  Surgeon: Stark Klein, MD;  Location: [place];  Service: General;  Laterality: N/A;  . COLONOSCOPY WITH ESOPHAGOGASTRODUODENOSCOPY (EGD)     with polypectomy  . ESOPHAGOGASTRODUODENOSCOPY N/A 07/24/2012   Procedure: ESOPHAGOGASTRODUODENOSCOPY (EGD);  Surgeon: Beryle Beams, MD;  Location: [facility] ENDOSCOPY;  Service:  Endoscopy;  Laterality: N/A;  . KNEE SURGERY    . MULTIPLE EXTRACTIONS WITH ALVEOLOPLASTY N/A 03/13/2018   Procedure: MULTIPLE EXTRACTION;  Surgeon: Diona Browner, DDS;  Location: [place];  Service: Oral Surgery;  Laterality: N/A;    Family History  Problem Relation Age of Onset  . Hypertension Father   . Prostate cancer Father   . Stomach cancer Father   . Hypertension Sister   . Diabetes Maternal Aunt   . Cancer - Other Cousin   . Parkinson's disease Paternal Aunt       Social History   Socioeconomic History  . Marital status: Married    Spouse name: Not on file  . Number of children: 6  . Years of education: Not on file  . Highest education level: Not on file  Occupational History  . Occupation: disability rep  Tobacco Use  . Smoking status: Former Smoker    Packs/day: 0.10    Years: 20.00    Pack years: 2.00    Types: Cigars    Quit date: 08/02/2017    Years since quitting: 2.8  . Smokeless tobacco: Never Used  Vaping Use  . Vaping Use: Some days  . Substances: CBD  . Devices: CBD vaping  Substance and Sexual Activity  . Alcohol use: Yes    Alcohol/week: 0.0 standard drinks    Comment: rarely   . Drug use: Yes    Frequency: 7.0 times per week    Types: Marijuana  . Sexual activity: Yes    Partners: Female    Birth control/protection: Condom  Other Topics Concern  . Not on file  Social History Narrative  . Not on file   Social Determinants of Health   Financial Resource Strain: Not on file  Food Insecurity: Not on file  Transportation Needs: Not on file  Physical Activity: Not on file  Stress: Not on file  Social Connections: Not on file    Allergies  Allergen Reactions  . Bactrim [Sulfamethoxazole-Trimethoprim]   . Bee Venom Anaphylaxis  . Sulfa Antibiotics Anaphylaxis  . Truvada [Emtricitabine-Tenofovir Df] Anaphylaxis and Rash    Takes plain tenofovir at home  . Lidoderm [Lidocaine] Other (See Comments)    Reaction unknown  . Raltegravir      resistance  . Ceftriaxone Rash  . Sulfamethoxazole Itching, Other (See Comments) and Rash    Other reaction(s): Hypotension (ALLERGY/intolerance)     Current Outpatient Medications:  .  DESCOVY 200-25 MG tablet, Take 1 tablet by mouth daily., Disp: 30 tablet, Rfl: 5 .  dolutegravir (TIVICAY) 50 MG tablet, Take 1 tablet (50 mg total) by mouth 2 (two) times daily., Disp: 60 tablet, Rfl: 5 .  EPINEPHrine (EPI-PEN) 0.3 mg/0.3 mL DEVI, Inject 0.3 mLs (0.3 mg total) into the muscle once., Disp: 1 Device, Rfl: 1 .  escitalopram (LEXAPRO) 20 MG tablet,  TAKE 1 TABLET BY MOUTH   DAILY (Patient taking differently: Take 20 mg by mouth daily. ), Disp: 30 tablet, Rfl: 1 .  Fostemsavir Tromethamine ER (RUKOBIA) 600 MG TB12, Take 1 tablet by mouth every 12 (twelve) hours., Disp: 60 tablet, Rfl: 5 .  LORazepam (ATIVAN) 1 MG tablet, Take 1 mg by mouth at bedtime as needed for anxiety or sleep., Disp: , Rfl:  .  losartan (COZAAR) 25 MG tablet, Take 25 mg by mouth daily., Disp: , Rfl:  .  metoprolol succinate (TOPROL-XL) 25 MG 24 hr tablet, Take 25 mg by mouth daily., Disp: , Rfl:  .  ondansetron (ZOFRAN-ODT) 8 MG disintegrating tablet, Take 1 tablet (8 mg total) by mouth every 8 (eight) hours as needed for nausea or vomiting., Disp: 30 tablet, Rfl: 5 .  Opium 10 MG/ML (1%) TINC, Take 0.6 mLs (6 mg total) by mouth every 6 (six) hours. (Patient not taking: Reported on 12/01/2019), Disp: 72 mL, Rfl: 0 .  PROMETHEGAN 25 MG suppository, Place 25 mg rectally every 6 (six) hours as needed for nausea or vomiting. , Disp: , Rfl:  .  traZODone (DESYREL) 50 MG tablet, Take 50 mg by mouth at bedtime. , Disp: , Rfl:  .  valACYclovir (VALTREX) 1000 MG tablet, TAKE 1 TABLET (1,000 MG TOTAL) BY MOUTH DAILY. (Patient taking differently: Take 1,000 mg by mouth daily. ), Disp: 30 tablet, Rfl: 4 .  warfarin (COUMADIN) 1 MG tablet, Take 1 mg by mouth every Friday. Take as directed with 53m tablet per [facility] on Fridays., Disp:  , Rfl:  .  warfarin (COUMADIN) 4 MG tablet, Take by mouth See admin instructions. Takes 5MG and 4MG together (9MG total) on Sundays and Tuesdays, Disp: , Rfl:  .  warfarin (COUMADIN) 5 MG tablet, Take 5 mg by mouth See admin instructions. Takes 5 MG on Monday, Wednesday, Thursday, Friday and Saturday, then 9 MG on Sunday and Tuesday, Disp: , Rfl:  .  zidovudine (RETROVIR) 300 MG tablet, Take 1 tablet (300 mg total) by mouth 2 (two) times daily., Disp: 60 tablet, Rfl: 5 No current facility-administered medications for this visit.  Facility-Administered Medications Ordered in Other Visits:  .  0.9 %  sodium chloride infusion, , Intravenous, Once, HCampbell Riches MD  Review of Systems  Constitutional: Negative for activity change, appetite change, chills, diaphoresis, fatigue, fever and unexpected weight change.  HENT: Negative for congestion, rhinorrhea, sinus pressure, sneezing, sore throat and trouble swallowing.   Eyes: Negative for photophobia and visual disturbance.  Respiratory: Negative for cough, chest tightness, shortness of breath, wheezing and stridor.   Cardiovascular: Negative for chest pain, palpitations and leg swelling.  Gastrointestinal: Positive for nausea and vomiting. Negative for abdominal distention, abdominal pain, anal bleeding, blood in stool, constipation and diarrhea.  Genitourinary: Negative for difficulty urinating, dysuria, flank pain and hematuria.  Musculoskeletal: Negative for arthralgias, back pain, gait problem, joint swelling and myalgias.  Skin: Negative for color change, pallor, rash and wound.  Neurological: Negative for dizziness, tremors, weakness and light-headedness.  Hematological: Negative for adenopathy. Does not bruise/bleed easily.  Psychiatric/Behavioral: Positive for dysphoric mood. Negative for agitation, behavioral problems, confusion, decreased concentration and sleep disturbance.       Objective:   Physical Exam Constitutional:       Appearance: He is well-developed.  HENT:     Head: Normocephalic and atraumatic.  Eyes:     Conjunctiva/sclera: Conjunctivae normal.  Cardiovascular:     Rate and Rhythm: Normal rate  and regular rhythm.  Pulmonary:     Effort: Pulmonary effort is normal. No respiratory distress.     Breath sounds: No wheezing.  Abdominal:     General: There is no distension.     Palpations: Abdomen is soft.  Musculoskeletal:        General: No tenderness. Normal range of motion.     Cervical back: Normal range of motion and neck supple.  Skin:    General: Skin is warm and dry.     Coloration: Skin is not pale.     Findings: No erythema or rash.  Neurological:     General: No focal deficit present.     Mental Status: He is alert and oriented to person, place, and time.  Psychiatric:        Mood and Affect: Mood normal.        Behavior: Behavior normal.        Thought Content: Thought content normal.        Judgment: Judgment normal.           Assessment & Plan:   HIV disease with multidrug resistant virus and patient who did not tolerate protease inhibitors well:   His recent CD4 count drop is undoubtedly due to the fact that his absolute lymphocyte count went down to 500.  His viral load remains suppressed.  I will have her recheck his viral load his CD4 count and a CBC with differential today.  If these are all reassuring he will follow-up with [place] in 6 months time  Continue twice daily TIVICAY Continue twice daily AZT Continue twice daily fostemsavir  Continue Descovy    Aortic valve replacement on anticoagulation.  Nausea and vomiting: Still an issue despite being taken off his protease inhibitor it is too bad that the [place] also has nausea as a side effect.  Perhaps if we have Lenacapravir as an option we could swap this and instead.

## 2020-06-01 LAB — T-HELPER CELL (CD4) - (RCID CLINIC ONLY)
CD4 % Helper T Cell: 22 % — ABNORMAL LOW (ref 33–65)
CD4 T Cell Abs: 465 /uL (ref 400–1790)

## 2020-06-02 LAB — CBC WITH DIFFERENTIAL/PLATELET
Absolute Monocytes: 336 cells/uL (ref 200–950)
Basophils Absolute: 39 cells/uL (ref 0–200)
Basophils Relative: 0.7 %
Eosinophils Absolute: 99 cells/uL (ref 15–500)
Eosinophils Relative: 1.8 %
HCT: 31.7 % — ABNORMAL LOW (ref 38.5–50.0)
Hemoglobin: 11.3 g/dL — ABNORMAL LOW (ref 13.2–17.1)
Lymphs Abs: 2475 cells/uL (ref 850–3900)
MCH: 39.8 pg — ABNORMAL HIGH (ref 27.0–33.0)
MCHC: 35.6 g/dL (ref 32.0–36.0)
MCV: 111.6 fL — ABNORMAL HIGH (ref 80.0–100.0)
MPV: 11.2 fL (ref 7.5–12.5)
Monocytes Relative: 6.1 %
Neutro Abs: 2552 cells/uL (ref 1500–7800)
Neutrophils Relative %: 46.4 %
Platelets: 197 10*3/uL (ref 140–400)
RBC: 2.84 10*6/uL — ABNORMAL LOW (ref 4.20–5.80)
RDW: 12.5 % (ref 11.0–15.0)
Total Lymphocyte: 45 %
WBC: 5.5 10*3/uL (ref 3.8–10.8)

## 2020-06-02 LAB — HIV-1 RNA QUANT-NO REFLEX-BLD
HIV 1 RNA Quant: <20 Copies/mL — ABNORMAL HIGH
HIV-1 RNA Quant, Log: <1.3 Log cps/mL — ABNORMAL HIGH

## 2020-07-13 ENCOUNTER — Other Ambulatory Visit: Payer: Self-pay | Admitting: Infectious Disease

## 2020-07-13 DIAGNOSIS — R1115 Cyclical vomiting syndrome unrelated to migraine: Secondary | ICD-10-CM

## 2020-07-14 ENCOUNTER — Other Ambulatory Visit (HOSPITAL_COMMUNITY): Payer: Self-pay

## 2020-10-04 ENCOUNTER — Other Ambulatory Visit: Payer: Self-pay | Admitting: Infectious Disease

## 2020-10-04 DIAGNOSIS — B20 Human immunodeficiency virus [HIV] disease: Secondary | ICD-10-CM

## 2020-10-04 MED ORDER — DESCOVY 200-25 MG PO TABS: 1.0000 | ORAL_TABLET | Freq: Every day | ORAL | 2 refills | Status: DC

## 2020-10-04 MED ORDER — RUKOBIA 600 MG PO TB12: ORAL_TABLET | ORAL | 2 refills | Status: DC

## 2020-10-04 MED ORDER — TIVICAY 50 MG PO TABS: 50.0000 mg | ORAL_TABLET | Freq: Two times a day (BID) | ORAL | 2 refills | Status: DC

## 2020-12-01 ENCOUNTER — Other Ambulatory Visit: Payer: Self-pay

## 2020-12-01 ENCOUNTER — Other Ambulatory Visit: Payer: Medicaid Other

## 2020-12-01 DIAGNOSIS — B20 Human immunodeficiency virus [HIV] disease: Secondary | ICD-10-CM

## 2020-12-04 LAB — COMPLETE METABOLIC PANEL WITH GFR
AG Ratio: 1.8 (calc) (ref 1.0–2.5)
ALT: 17 U/L (ref 9–46)
AST: 24 U/L (ref 10–35)
Albumin: 3.9 g/dL (ref 3.6–5.1)
Alkaline phosphatase (APISO): 56 U/L (ref 35–144)
BUN: 14 mg/dL (ref 7–25)
CO2: 27 mmol/L (ref 20–32)
Calcium: 9.2 mg/dL (ref 8.6–10.3)
Chloride: 108 mmol/L (ref 98–110)
Creat: 1.16 mg/dL (ref 0.70–1.30)
Globulin: 2.2 g/dL (calc) (ref 1.9–3.7)
Glucose, Bld: 96 mg/dL (ref 65–99)
Potassium: 4.3 mmol/L (ref 3.5–5.3)
Sodium: 141 mmol/L (ref 135–146)
Total Bilirubin: 0.6 mg/dL (ref 0.2–1.2)
Total Protein: 6.1 g/dL (ref 6.1–8.1)
eGFR: 75 mL/min/{1.73_m2} (ref >=60)

## 2020-12-04 LAB — CBC WITH DIFFERENTIAL/PLATELET
Absolute Monocytes: 329 cells/uL (ref 200–950)
Basophils Absolute: 22 cells/uL (ref 0–200)
Basophils Relative: 0.4 %
Eosinophils Absolute: 70 cells/uL (ref 15–500)
Eosinophils Relative: 1.3 %
HCT: 31.5 % — ABNORMAL LOW (ref 38.5–50.0)
Hemoglobin: 11.2 g/dL — ABNORMAL LOW (ref 13.2–17.1)
Lymphs Abs: 2052 cells/uL (ref 850–3900)
MCH: 39.4 pg — ABNORMAL HIGH (ref 27.0–33.0)
MCHC: 35.6 g/dL (ref 32.0–36.0)
MCV: 110.9 fL — ABNORMAL HIGH (ref 80.0–100.0)
MPV: 11.6 fL (ref 7.5–12.5)
Monocytes Relative: 6.1 %
Neutro Abs: 2927 cells/uL (ref 1500–7800)
Neutrophils Relative %: 54.2 %
Platelets: 190 10*3/uL (ref 140–400)
RBC: 2.84 10*6/uL — ABNORMAL LOW (ref 4.20–5.80)
RDW: 13.3 % (ref 11.0–15.0)
Total Lymphocyte: 38 %
WBC: 5.4 10*3/uL (ref 3.8–10.8)

## 2020-12-04 LAB — HIV-1 RNA QUANT-NO REFLEX-BLD
HIV 1 RNA Quant: <20 Copies/mL — ABNORMAL HIGH
HIV-1 RNA Quant, Log: <1.3 Log cps/mL — ABNORMAL HIGH

## 2020-12-04 LAB — RPR: RPR Ser Ql: NONREACTIVE

## 2020-12-04 LAB — T-HELPER CELLS (CD4) COUNT (NOT AT ARMC)
Absolute CD4: 533 cells/uL (ref 490–1740)
CD4 T Helper %: 26 % — ABNORMAL LOW (ref 30–61)
Total lymphocyte count: 2072 cells/uL (ref 850–3900)

## 2020-12-15 ENCOUNTER — Ambulatory Visit (INDEPENDENT_AMBULATORY_CARE_PROVIDER_SITE_OTHER): Payer: Medicaid Other

## 2020-12-15 ENCOUNTER — Ambulatory Visit (INDEPENDENT_AMBULATORY_CARE_PROVIDER_SITE_OTHER): Payer: Medicaid Other | Admitting: Infectious Disease

## 2020-12-15 ENCOUNTER — Other Ambulatory Visit: Payer: Self-pay

## 2020-12-15 VITALS — BP 126/80 | HR 79 | Temp 97.5°F | Resp 16 | Ht 68.0 in | Wt 127.0 lb

## 2020-12-15 DIAGNOSIS — B20 Human immunodeficiency virus [HIV] disease: Secondary | ICD-10-CM | POA: Diagnosis not present

## 2020-12-15 DIAGNOSIS — Z23 Encounter for immunization: Secondary | ICD-10-CM

## 2020-12-15 DIAGNOSIS — Z7901 Long term (current) use of anticoagulants: Secondary | ICD-10-CM | POA: Diagnosis not present

## 2020-12-15 DIAGNOSIS — R110 Nausea: Secondary | ICD-10-CM

## 2020-12-15 DIAGNOSIS — Z952 Presence of prosthetic heart valve: Secondary | ICD-10-CM

## 2020-12-15 MED ORDER — VALACYCLOVIR HCL 1 G PO TABS: 1000.0000 mg | ORAL_TABLET | Freq: Every day | ORAL | 11 refills | Status: DC

## 2020-12-15 MED ORDER — TIVICAY 50 MG PO TABS: 50.0000 mg | ORAL_TABLET | Freq: Two times a day (BID) | ORAL | 11 refills | Status: DC

## 2020-12-15 MED ORDER — RUKOBIA 600 MG PO TB12: ORAL_TABLET | ORAL | 11 refills | Status: DC

## 2020-12-15 MED ORDER — ZIDOVUDINE 300 MG PO TABS: 300.0000 mg | ORAL_TABLET | Freq: Two times a day (BID) | ORAL | 11 refills | Status: DC

## 2020-12-15 MED ORDER — DESCOVY 200-25 MG PO TABS: 1.0000 | ORAL_TABLET | Freq: Every day | ORAL | 11 refills | Status: DC

## 2020-12-15 NOTE — Progress Notes (Signed)
Subjective:   Chief complaint: followup for HIV disease on medications.   Patient ID: Brandon [name], male    DOB: 09-02-1965, 55 y.o.   MRN: [id number]  HPI   55 year old Brandon [name] is a highly complicated man with history of  HIV/AIDS and Multi-DRUG RESISTANT virus formerly followed at [facility]. His HIV nadir was  20 when we first met him     He had been on various complicated antiretroviral regimens in the past, with unfortunate GENOTYPIC resistance to all non-nucleoside reverse transcriptase inhibitors and all NRTIs, Resistance to all protease inhibitors with the exception of Prezista which had some activity genotypically,, Resistance to Isentress, and Elvitegravir and  reduced S to dolutegravir having both a 148H and 140S  and with Dual tropic virus.   02/12/2010 phenotype at [facility] showed:  RT: NRTI: ABC, DDI, D4T, AZT, TDF: resistant; 3TC, FTC: susceptible; NNRTI: EFV susceptible; RPV, NVP, ETR, DLV: Resistant; PI: pan-resistant   He then decided to go back onto [facility] and WAS  referred to [facility].   We  Had  seen him and placed him on a  salvage regimen of Prezista 662m  Twice daily boosted with Norvir 1017mtwice daily, Tivicay twice daily, Combivir twice daily and once daily Viread.   And since then he haD BEEN WITH AN UNDETECTABLE VIRAL LOAD <20 FOR MORE THAN  four  YEARS  And  Healthy CD4 count.   Did have one time where his viral load popped into the thousands but there was no resistance seen and he was able to resuppress on :  --Tivicay 5038mID --Prezista 600m47mD with  --Norvir 100mg58m -AZT  BID --DESCOVY q daily   He continued  to have problems with nausea and vomiting that he attributes to DRV boosted w RTV   We subsequently switched off DRV/RTV BID for Fostemsavir BID along with continuing the remainder of his salvage regimen.  His nausea had dramatically improved and he states that he does not vomit nearly as frequently as he did before on the  prior regimen.  He still does suffer the nausea though   We discussed the drug lenacapvir that should become FDA approved soon.  He currently is comfortable with regimen and is now skeptical that his nausea is related to his ARV's.     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   Stroke (HCC) Brandon [name] loss, unintentional     Past Surgical History:  Procedure Laterality Date   AORTIC VALVE REPLACEMENT     CARDIAC SURGERY     CHOLECYSTECTOMY  02/12/2012   Procedure: LAPAROSCOPIC CHOLECYSTECTOMY;  Surgeon: FaeraStark Klein  Location: [facility] OR;  Service: General;  Laterality: N/A;   COLONOSCOPY WITH ESOPHAGOGASTRODUODENOSCOPY (EGD)     with polypectomy  ESOPHAGOGASTRODUODENOSCOPY N/A 07/24/2012   Procedure: ESOPHAGOGASTRODUODENOSCOPY (EGD);  Surgeon: Beryle Beams, MD;  Location: [facility] ENDOSCOPY;  Service: Endoscopy;  Laterality: N/A;   KNEE SURGERY     MULTIPLE EXTRACTIONS WITH ALVEOLOPLASTY N/A 03/13/2018   Procedure: MULTIPLE EXTRACTION;  Surgeon: Diona Browner, DDS;  Location: [place];  Service: Oral Surgery;  Laterality: N/A;    Family History  Problem Relation Age of Onset   Hypertension Father    Prostate cancer Father    Stomach cancer Father    Hypertension Sister    Diabetes Maternal Aunt    Cancer - Other Cousin    Parkinson's disease Paternal Aunt       Social History   Socioeconomic History   Marital status: Married    Spouse  name: Not on file   Number of children: 6   Years of education: Not on file   Highest education level: Not on file  Occupational History   Occupation: disability rep  Tobacco Use   Smoking status: Former    Packs/day: 0.10    Years: 20.00    Pack years: 2.00    Types: Cigars, Cigarettes    Quit date: 08/02/2017    Years since quitting: 3.3   Smokeless tobacco: Never  Vaping Use   Vaping Use: Some days   Substances: CBD   Devices: CBD vaping  Substance and Sexual Activity   Alcohol use: Yes    Alcohol/week: 0.0 standard drinks    Comment: rarely    Drug use: Yes    Frequency: 7.0 times per week    Types: Marijuana   Sexual activity: Yes    Partners: Female    Birth control/protection: Condom  Other Topics Concern   Not on file  Social History Narrative   Not on file   Social Determinants of Health   Financial Resource Strain: Not on file  Food Insecurity: Not on file  Transportation Needs: Not on file  Physical Activity: Not on file  Stress: Not on file  Social Connections: Not on file    Allergies  Allergen Reactions   Bactrim [Sulfamethoxazole-Trimethoprim]    Bee Venom Anaphylaxis   Sulfa Antibiotics Anaphylaxis   Truvada [Emtricitabine-Tenofovir Df] Anaphylaxis and Rash    Takes plain tenofovir at home   Lidoderm [Lidocaine] Other (See Comments)    Reaction unknown   Raltegravir     resistance   Ceftriaxone Rash   Sulfamethoxazole Itching, Other (See Comments) and Rash    Other reaction(s): Hypotension (ALLERGY/intolerance)     Current Outpatient Medications:    ARIPiprazole (ABILIFY) 5 MG tablet, Take 1 tablet by mouth at bedtime., Disp: , Rfl:    dolutegravir (TIVICAY) 50 MG tablet, Take 1 tablet (50 mg total) by mouth 2 (two) times daily., Disp: 60 tablet, Rfl: 2   emtricitabine-tenofovir AF (DESCOVY) 200-25 MG tablet, Take 1 tablet by mouth daily., Disp: 30 tablet, Rfl: 2   EPINEPHrine (EPI-PEN) 0.3 mg/0.3 mL DEVI, Inject 0.3 mLs (0.3 mg total)  into the muscle once., Disp: 1 Device, Rfl: 1   escitalopram (LEXAPRO) 20 MG tablet, TAKE 1 TABLET BY MOUTH   DAILY (Patient taking differently: Take 20 mg by mouth daily.), Disp: 30 tablet, Rfl: 1   fostemsavir tromethamine (RUKOBIA) 600 MG TB12 ER tablet, Take 1 tablet by mouth every 12 (twelve) hours., Disp: 60 tablet, Rfl: 2   furosemide (LASIX) 20 MG tablet, Please take one tablet 20 mg by mouth daily as needed for shortness of breath., Disp: , Rfl:  LORazepam (ATIVAN) 1 MG tablet, Take 1 mg by mouth at bedtime as needed for anxiety or sleep., Disp: , Rfl:    losartan (COZAAR) 25 MG tablet, Take 25 mg by mouth daily., Disp: , Rfl:    metoprolol succinate (TOPROL-XL) 25 MG 24 hr tablet, Take 25 mg by mouth daily., Disp: , Rfl:    ondansetron (ZOFRAN-ODT) 8 MG disintegrating tablet, Take 1 tablet (8 mg total) by mouth every 8 (eight) hours as needed for nausea or vomiting., Disp: 30 tablet, Rfl: 5   Opium 10 MG/ML (1%) TINC, Take 0.6 mLs (6 mg total) by mouth every 6 (six) hours., Disp: 72 mL, Rfl: 0   PROMETHEGAN 25 MG suppository, Place 25 mg rectally every 6 (six) hours as needed for nausea or vomiting. , Disp: , Rfl:    traZODone (DESYREL) 50 MG tablet, Take 50 mg by mouth at bedtime. , Disp: , Rfl:    valACYclovir (VALTREX) 1000 MG tablet, TAKE 1 TABLET (1,000 MG TOTAL) BY MOUTH DAILY. (Patient taking differently: Take 1,000 mg by mouth daily.), Disp: 30 tablet, Rfl: 4   warfarin (COUMADIN) 1 MG tablet, Take 1 mg by mouth every Friday. Take as directed with 56m tablet per [facility] on Fridays., Disp: , Rfl:    warfarin (COUMADIN) 4 MG tablet, Take by mouth See admin instructions. Takes 5MG and 4MG together (9MG total) on Sundays and Tuesdays, Disp: , Rfl:    warfarin (COUMADIN) 5 MG tablet, Take 5 mg by mouth See admin instructions. Takes 5 MG on Monday, Wednesday, Thursday, Friday and Saturday, then 9 MG on Sunday and Tuesday, Disp: , Rfl:    zidovudine (RETROVIR) 300 MG tablet, Take  1 tablet (300 mg total) by mouth 2 (two) times daily., Disp: 60 tablet, Rfl: 2 No current facility-administered medications for this visit.  Facility-Administered Medications Ordered in Other Visits:    0.9 %  sodium chloride infusion, , Intravenous, Once, HCampbell Riches MD  Review of Systems  Constitutional:  Negative for activity change, appetite change, chills, diaphoresis, fatigue, fever and unexpected weight change.  HENT:  Negative for congestion, rhinorrhea, sinus pressure, sneezing, sore throat and trouble swallowing.   Eyes:  Negative for photophobia and visual disturbance.  Respiratory:  Negative for cough, chest tightness, shortness of breath, wheezing and stridor.   Cardiovascular:  Negative for chest pain, palpitations and leg swelling.  Gastrointestinal:  Positive for nausea. Negative for abdominal distention, abdominal pain, anal bleeding, blood in stool, constipation, diarrhea and vomiting.  Genitourinary:  Negative for difficulty urinating, dysuria, flank pain and hematuria.  Musculoskeletal:  Negative for arthralgias, back pain, gait problem, joint swelling and myalgias.  Skin:  Negative for color change, pallor, rash and wound.  Neurological:  Negative for dizziness, tremors, weakness and light-headedness.  Hematological:  Negative for adenopathy. Does not bruise/bleed easily.  Psychiatric/Behavioral:  Negative for agitation, behavioral problems, confusion, decreased concentration, dysphoric mood, self-injury and sleep disturbance. The patient is not nervous/anxious.       Objective:   Physical Exam Constitutional:      Appearance: He is well-developed.  HENT:     Head: Normocephalic and atraumatic.  Eyes:     Conjunctiva/sclera: Conjunctivae normal.  Cardiovascular:     Rate and Rhythm: Normal rate and regular rhythm.  Pulmonary:     Effort: Pulmonary effort is normal. No respiratory distress.     Breath sounds: No wheezing.  Abdominal:     General: There  is no distension.  Palpations: Abdomen is soft.  Musculoskeletal:        General: No tenderness. Normal range of motion.     Cervical back: Normal range of motion and neck supple.  Skin:    General: Skin is warm and dry.     Coloration: Skin is not pale.     Findings: No erythema or rash.  Neurological:     General: No focal deficit present.     Mental Status: He is alert and oriented to person, place, and time.  Psychiatric:        Mood and Affect: Mood normal.        Behavior: Behavior normal.        Thought Content: Thought content normal.        Judgment: Judgment normal.          Assessment & Plan:   HIV disease with MDR HIV  I have reviewed his VL from 12/01/2020 <20 and CD4 was 533  I have reviewed his cumulative genotype again.  I will continue his BID Tivicay, Rukobia, AZT and daily Descovy  One could consider swap in Lenacaprivir SQ Q 6 months and take away the AZT as one intervention in the future  Nausea: he wants to be referred to allergist and will pursue thjis with PCP  AVR on anticoaguation: being followed at [facility] and on coumadin with recent INR reviewed and was 2.97   Vaccine  counseling: recommended flu vaccine and COVID 19 bivalent vaccine.

## 2021-04-03 ENCOUNTER — Other Ambulatory Visit (HOSPITAL_COMMUNITY): Payer: Self-pay

## 2021-04-04 ENCOUNTER — Emergency Department (HOSPITAL_COMMUNITY)
Admission: EM | Admit: 2021-04-04 | Discharge: 2021-04-04 | Disposition: A | Discharge status: Home / Self Care | Payer: Medicaid Other | Attending: Emergency Medicine | Admitting: Emergency Medicine

## 2021-04-04 ENCOUNTER — Emergency Department (HOSPITAL_COMMUNITY): Payer: Medicaid Other

## 2021-04-04 ENCOUNTER — Encounter (HOSPITAL_COMMUNITY): Payer: Self-pay | Admitting: Emergency Medicine

## 2021-04-04 DIAGNOSIS — M6282 Rhabdomyolysis: Secondary | ICD-10-CM | POA: Diagnosis not present

## 2021-04-04 DIAGNOSIS — R112 Nausea with vomiting, unspecified: Secondary | ICD-10-CM | POA: Diagnosis present

## 2021-04-04 DIAGNOSIS — R1013 Epigastric pain: Secondary | ICD-10-CM | POA: Insufficient documentation

## 2021-04-04 DIAGNOSIS — Z79899 Other long term (current) drug therapy: Secondary | ICD-10-CM | POA: Diagnosis not present

## 2021-04-04 LAB — CBC
HCT: 39.9 % (ref 39.0–52.0)
Hemoglobin: 13.9 g/dL (ref 13.0–17.0)
MCH: 39.9 pg — ABNORMAL HIGH (ref 26.0–34.0)
MCHC: 34.8 g/dL (ref 30.0–36.0)
MCV: 114.7 fL — ABNORMAL HIGH (ref 80.0–100.0)
Platelets: 234 10*3/uL (ref 150–400)
RBC: 3.48 MIL/uL — ABNORMAL LOW (ref 4.22–5.81)
RDW: 13.6 % (ref 11.5–15.5)
WBC: 7.5 10*3/uL (ref 4.0–10.5)
nRBC: 0 % (ref 0.0–0.2)

## 2021-04-04 LAB — RAPID URINE DRUG SCREEN, HOSP PERFORMED
Amphetamines: NOT DETECTED
Barbiturates: NOT DETECTED
Benzodiazepines: NOT DETECTED
Cocaine: NOT DETECTED
Opiates: NOT DETECTED
Tetrahydrocannabinol: POSITIVE — AB

## 2021-04-04 LAB — URINALYSIS, ROUTINE W REFLEX MICROSCOPIC
Bilirubin Urine: NEGATIVE
Glucose, UA: NEGATIVE mg/dL
Ketones, ur: NEGATIVE mg/dL
Leukocytes,Ua: NEGATIVE
Nitrite: NEGATIVE
Protein, ur: >=300 mg/dL — AB
Specific Gravity, Urine: >1.046 — ABNORMAL HIGH (ref 1.005–1.030)
pH: 5 (ref 5.0–8.0)

## 2021-04-04 LAB — COMPREHENSIVE METABOLIC PANEL
ALT: 29 U/L (ref 0–44)
AST: 44 U/L — ABNORMAL HIGH (ref 15–41)
Albumin: 4.7 g/dL (ref 3.5–5.0)
Alkaline Phosphatase: 64 U/L (ref 38–126)
Anion gap: 8 (ref 5–15)
BUN: 29 mg/dL — ABNORMAL HIGH (ref 6–20)
CO2: 30 mmol/L (ref 22–32)
Calcium: 9.2 mg/dL (ref 8.9–10.3)
Chloride: 99 mmol/L (ref 98–111)
Creatinine, Ser: 1.35 mg/dL — ABNORMAL HIGH (ref 0.61–1.24)
GFR, Estimated: >60 mL/min (ref >60)
Glucose, Bld: 119 mg/dL — ABNORMAL HIGH (ref 70–99)
Potassium: 4 mmol/L (ref 3.5–5.1)
Sodium: 137 mmol/L (ref 135–145)
Total Bilirubin: 1.2 mg/dL (ref 0.3–1.2)
Total Protein: 8.2 g/dL — ABNORMAL HIGH (ref 6.5–8.1)

## 2021-04-04 LAB — LIPASE, BLOOD: Lipase: 57 U/L — ABNORMAL HIGH (ref 11–51)

## 2021-04-04 LAB — CK: Total CK: 330 U/L (ref 49–397)

## 2021-04-04 MED ORDER — SODIUM CHLORIDE (PF) 0.9 % IJ SOLN
INTRAMUSCULAR | Status: AC
  Filled 2021-04-04: qty 50

## 2021-04-04 MED ORDER — LACTATED RINGERS IV BOLUS
1000.0000 mL | Freq: Once | INTRAVENOUS | Status: AC
Start: 2021-04-04 — End: 2021-04-04
  Administered 2021-04-04: 13:00:00 1000 mL via INTRAVENOUS

## 2021-04-04 MED ORDER — IOHEXOL 300 MG/ML  SOLN
100.0000 mL | Freq: Once | INTRAMUSCULAR | Status: AC | PRN
  Administered 2021-04-04: 14:00:00 100 mL via INTRAVENOUS

## 2021-04-04 MED ORDER — ALUM & MAG HYDROXIDE-SIMETH 200-200-20 MG/5ML PO SUSP
30.0000 mL | Freq: Once | ORAL | Status: AC
  Administered 2021-04-04: 13:00:00 30 mL via ORAL
  Filled 2021-04-04: qty 30

## 2021-04-04 MED ORDER — ONDANSETRON HCL 4 MG/2ML IJ SOLN
4.0000 mg | Freq: Once | INTRAMUSCULAR | Status: AC
Start: 2021-04-04 — End: 2021-04-04
  Administered 2021-04-04: 15:00:00 4 mg via INTRAVENOUS
  Filled 2021-04-04: qty 2

## 2021-04-04 MED ORDER — MORPHINE SULFATE (PF) 4 MG/ML IV SOLN
4.0000 mg | Freq: Once | INTRAVENOUS | Status: AC
  Administered 2021-04-04: 15:00:00 4 mg via INTRAVENOUS
  Filled 2021-04-04: qty 1

## 2021-04-04 MED ORDER — ONDANSETRON HCL 4 MG PO TABS: 4.0000 mg | ORAL_TABLET | Freq: Four times a day (QID) | ORAL | 0 refills | Status: DC

## 2021-04-04 MED ORDER — LACTATED RINGERS IV BOLUS
1000.0000 mL | Freq: Once | INTRAVENOUS | Status: AC
Start: 2021-04-04 — End: 2021-04-04
  Administered 2021-04-04: 15:00:00 1000 mL via INTRAVENOUS

## 2021-04-04 NOTE — ED Notes (Signed)
Pt tolerated PO fluids as ordered.

## 2021-04-04 NOTE — ED Provider Notes (Signed)
Care assumed from [facility], [place]. See her note for full H&P  Per her note, "Brandon [name] is a 56 y.o. male who presents to the ED for evaluation of epigastric pain, nausea and vomiting that started last night.  Patient states that this is happened intermittently over the last 20 years and has been seen several times in the ED for similar symptoms.  Each time he has had a full work-up without any known cause.  He also states that he is seeing a GI specialist and they were unable to figure out why he has this recurrent pain.  Patient states he has not urinated in 2 days and believes this is due to to his constant vomiting and inability to keep fluids down.  He denies fevers, chills, chest pain, cough and congestion.  He is having some diarrhea now.  He denies urinary symptoms."  Physical Exam  BP 136/71    Pulse 77    Temp 98.8 F (37.1 C) (Oral)    Resp 18    Ht 5\' 8"  (1.727 m)    Wt 57.6 kg    SpO2 98%    BMI 19.31 kg/m   Physical Exam Constitutional:      General: He is not in acute distress.    Appearance: He is well-developed.  Eyes:     Conjunctiva/sclera: Conjunctivae normal.  Cardiovascular:     Rate and Rhythm: Normal rate.  Pulmonary:     Effort: Pulmonary effort is normal.  Skin:    General: Skin is warm and dry.  Neurological:     Mental Status: He is alert and oriented to person, place, and time.    Procedures  Procedures Results for orders placed or performed during the hospital encounter of 04/04/21  Lipase, blood  Result Value Ref Range   Lipase 57 (H) 11 - 51 U/L  Comprehensive metabolic panel  Result Value Ref Range   Sodium 137 135 - 145 mmol/L   Potassium 4.0 3.5 - 5.1 mmol/L   Chloride 99 98 - 111 mmol/L   CO2 30 22 - 32 mmol/L   Glucose, Bld 119 (H) 70 - 99 mg/dL   BUN 29 (H) 6 - 20 mg/dL   Creatinine, Ser 1.35 (H) 0.61 - 1.24 mg/dL   Calcium 9.2 8.9 - 10.3 mg/dL   Total Protein 8.2 (H) 6.5 - 8.1 g/dL   Albumin 4.7 3.5 - 5.0 g/dL   AST 44 (H)  15 - 41 U/L   ALT 29 0 - 44 U/L   Alkaline Phosphatase 64 38 - 126 U/L   Total Bilirubin 1.2 0.3 - 1.2 mg/dL   GFR, Estimated >60 >60 mL/min   Anion gap 8 5 - 15  CBC  Result Value Ref Range   WBC 7.5 4.0 - 10.5 K/uL   RBC 3.48 (L) 4.22 - 5.81 MIL/uL   Hemoglobin 13.9 13.0 - 17.0 g/dL   HCT 39.9 39.0 - 52.0 %   MCV 114.7 (H) 80.0 - 100.0 fL   MCH 39.9 (H) 26.0 - 34.0 pg   MCHC 34.8 30.0 - 36.0 g/dL   RDW 13.6 11.5 - 15.5 %   Platelets 234 150 - 400 K/uL   nRBC 0.0 0.0 - 0.2 %  Urinalysis, Routine w reflex microscopic Urine, Clean Catch  Result Value Ref Range   Color, Urine AMBER (A) YELLOW   APPearance HAZY (A) CLEAR   Specific Gravity, Urine >1.046 (H) 1.005 - 1.030   pH 5.0 5.0 -  8.0   Glucose, UA NEGATIVE NEGATIVE mg/dL   Hgb urine dipstick LARGE (A) NEGATIVE   Bilirubin Urine NEGATIVE NEGATIVE   Ketones, ur NEGATIVE NEGATIVE mg/dL   Protein, ur >=300 (A) NEGATIVE mg/dL   Nitrite NEGATIVE NEGATIVE   Leukocytes,Ua NEGATIVE NEGATIVE   RBC / HPF 6-10 0 - 5 RBC/hpf   WBC, UA 6-10 0 - 5 WBC/hpf   Bacteria, UA RARE (A) NONE SEEN   Squamous Epithelial / LPF 0-5 0 - 5   Mucus PRESENT   CK  Result Value Ref Range   Total CK 330 49 - 397 U/L  Rapid urine drug screen (hospital performed)  Result Value Ref Range   Opiates NONE DETECTED NONE DETECTED   Cocaine NONE DETECTED NONE DETECTED   Benzodiazepines NONE DETECTED NONE DETECTED   Amphetamines NONE DETECTED NONE DETECTED   Tetrahydrocannabinol POSITIVE (A) NONE DETECTED   Barbiturates NONE DETECTED NONE DETECTED   CT ABDOMEN PELVIS W CONTRAST  Result Date: 04/04/2021 CLINICAL DATA:  Abdominal pain with nausea and vomiting EXAM: CT ABDOMEN AND PELVIS WITH CONTRAST TECHNIQUE: Multidetector CT imaging of the abdomen and pelvis was performed using the standard protocol following bolus administration of intravenous contrast. RADIATION DOSE REDUCTION: This exam was performed according to the departmental dose-optimization  program which includes automated exposure control, adjustment of the mA and/or kV according to patient size and/or use of iterative reconstruction technique. CONTRAST:  142mL OMNIPAQUE IOHEXOL 300 MG/ML  SOLN COMPARISON:  CT abdomen and pelvis dated July 06, 2019 FINDINGS: Lower chest: No acute abnormality. Hepatobiliary: No focal liver abnormality is seen. No gallstones, gallbladder wall thickening, or biliary dilatation. Pancreas: Unremarkable. No pancreatic ductal dilatation or surrounding inflammatory changes. Spleen: Normal in size without focal abnormality. Adrenals/Urinary Tract: Bilateral adrenal glands are unremarkable. Kidneys enhance symmetrically with no evidence of hydronephrosis. Low-attenuation lesion of the lower pole of the right kidney which is likely a simple cyst. Bladder is unremarkable. Stomach/Bowel: Stomach is within normal limits. Appendix not definitely visualized, although there are no secondary findings of acute appendicitis. No evidence of bowel wall thickening, distention, or inflammatory changes. Vascular/Lymphatic: Aortic atherosclerosis. Diffuse moderate narrowing of the left common iliac artery due to calcified and noncalcified plaque. No enlarged abdominal or pelvic lymph nodes. Reproductive: Prostatomegaly. Other: No abdominal wall hernia or abnormality. No abdominopelvic ascites. Musculoskeletal: No acute or significant osseous findings. IMPRESSION: 1. No acute findings in the abdomen or pelvis. 2.  Aortic Atherosclerosis (ICD10-I70.0). Electronically Signed   By: Yetta Glassman M.D.   On: 04/04/2021 14:05     ED Course / MDM   Clinical Course as of 04/04/21 1714  Wed Apr 04, 2021  1416 CT ABDOMEN PELVIS W CONTRAST [EC]  1433 Urinalysis, Routine w reflex microscopic Urine, Clean Catch(!) Urine was dark-colored, nearly red.  Large amounts of hemoglobin without red blood cells.  This is consistent with rhabdomyolysis.  I notified my attending, Dr. Godfrey Pick who  evaluated the patient himself.  We will order CK and UDS as he suspects rhabdo may have been triggered by drug use.  Patient to receive 2 L lactated ringer fluid bolus. [EC]  1638 CK CK normal [EC]  5053 Rapid urine drug screen (hospital performed)(!) UDS positive for [facility] [EC]    Clinical Course User Index [EC] [name], PA-C   Medical Decision Making Amount and/or Complexity of Data Reviewed Labs: ordered. Decision-making details documented in ED Course. Radiology: ordered. Decision-making details documented in ED Course.  Risk OTC drugs. Prescription  drug management.   56 y/o male presents for eval of nv. Prior team had concern about rhabdomyolysis however pt has no evidence of this. His ct is negative. He received antiemetics and a gi cocktail and on reassessment he is feeling improved and has been able to tolerate po. Feel his sxs are likely related to his h/o cyclic vomiting syndrome. Cannabis hyperemesis may also be possible. D/c with zofran. Advised to f/u with pcp and return if worse. All questions answered, pt stable for discharge.        Bishop Dublin 04/04/21 1715    Luna Fuse, MD 04/06/21 [phone]

## 2021-04-04 NOTE — ED Provider Notes (Signed)
Brandon [name] DEPT Provider Note   CSN: [id number] Arrival date & time: 04/04/21  5621     History  Chief Complaint  Patient presents with   Nausea   Emesis    Brandon [name] is a 56 y.o. male who presents to the ED for evaluation of epigastric pain, nausea and vomiting that started last night.  Patient states that this is happened intermittently over the last 20 years and has been seen several times in the ED for similar symptoms.  Each time he has had a full work-up without any known cause.  He also states that he is seeing a GI specialist and they were unable to figure out why he has this recurrent pain.  Patient states he has not urinated in 2 days and believes this is due to to his constant vomiting and inability to keep fluids down.  He denies fevers, chills, chest pain, cough and congestion.  He is having some diarrhea now.  He denies urinary symptoms.   Emesis Associated symptoms: abdominal pain   Associated symptoms: no fever and no headaches       Home Medications Prior to Admission medications   Medication Sig Start Date End Date Taking? Authorizing Provider  ARIPiprazole (ABILIFY) 5 MG tablet Take 1 tablet by mouth at bedtime. 04/21/20   [provider]  dolutegravir (TIVICAY) 50 MG tablet Take 1 tablet (50 mg total) by mouth 2 (two) times daily. 12/15/20   Truman Hayward, MD  emtricitabine-tenofovir AF (DESCOVY) 200-25 MG tablet Take 1 tablet by mouth daily. 12/15/20   Truman Hayward, MD  EPINEPHrine (EPI-PEN) 0.3 mg/0.3 mL DEVI Inject 0.3 mLs (0.3 mg total) into the muscle once. 08/20/12   Truman Hayward, MD  escitalopram (LEXAPRO) 20 MG tablet TAKE 1 TABLET BY MOUTH   DAILY Patient taking differently: Take 20 mg by mouth daily. 02/14/16   Truman Hayward, MD  fostemsavir tromethamine (RUKOBIA) 600 MG TB12 ER tablet Take 1 tablet by mouth every 12 (twelve) hours. 12/15/20   Truman Hayward, MD  furosemide  (LASIX) 20 MG tablet Please take one tablet 20 mg by mouth daily as needed for shortness of breath. 01/04/20   [provider]  LORazepam (ATIVAN) 1 MG tablet Take 1 mg by mouth at bedtime as needed for anxiety or sleep.    [provider]  losartan (COZAAR) 25 MG tablet Take 25 mg by mouth daily. 09/24/19   [provider]  metoprolol succinate (TOPROL-XL) 25 MG 24 hr tablet Take 25 mg by mouth daily. 11/18/19   [provider]  ondansetron (ZOFRAN-ODT) 8 MG disintegrating tablet Take 1 tablet (8 mg total) by mouth every 8 (eight) hours as needed for nausea or vomiting. 07/13/20   Tommy Medal, Lavell Islam, MD  Opium 10 MG/ML (1%) TINC Take 0.6 mLs (6 mg total) by mouth every 6 (six) hours. 07/25/16   Truman Hayward, MD  PROMETHEGAN 25 MG suppository Place 25 mg rectally every 6 (six) hours as needed for nausea or vomiting.  10/25/19   [provider]  traZODone (DESYREL) 50 MG tablet Take 50 mg by mouth at bedtime.  12/11/18   [provider]  valACYclovir (VALTREX) 1000 MG tablet Take 1 tablet (1,000 mg total) by mouth daily. 12/15/20   Truman Hayward, MD  warfarin (COUMADIN) 1 MG tablet Take 1 mg by mouth every Friday. Take as directed with 5mg  tablet  per [facility] on Fridays. 03/24/14   [provider]  warfarin (COUMADIN) 4 MG tablet Take by mouth See admin instructions. Takes 5MG  and 4MG  together (9MG  total) on Sundays and Tuesdays 10/22/19   [provider]  warfarin (COUMADIN) 5 MG tablet Take 5 mg by mouth See admin instructions. Takes 5 MG on Monday, Wednesday, Thursday, Friday and Saturday, then 9 MG on Sunday and Tuesday    [provider]  zidovudine (RETROVIR) 300 MG tablet Take 1 tablet (300 mg total) by mouth 2 (two) times daily. 12/15/20   Truman Hayward, MD      Allergies    Bactrim [sulfamethoxazole-trimethoprim], Bee venom, Sulfa antibiotics, Truvada [emtricitabine-tenofovir df], Lidoderm  [lidocaine], Raltegravir, Ceftriaxone, and Sulfamethoxazole    Review of Systems   Review of Systems  Constitutional:  Negative for fever.  HENT: Negative.    Eyes: Negative.   Respiratory:  Negative for shortness of breath.   Cardiovascular: Negative.   Gastrointestinal:  Positive for abdominal pain, nausea and vomiting.  Endocrine: Negative.   Genitourinary: Negative.   Musculoskeletal: Negative.   Skin:  Negative for rash.  Neurological:  Negative for headaches.  All other systems reviewed and are negative.  Physical Exam Updated Vital Signs BP 105/72    Pulse 82    Temp 98 F (36.7 C) (Oral)    Resp 16    SpO2 99%  Physical Exam Vitals and nursing note reviewed.  Constitutional:      General: He is not in acute distress.    Appearance: He is not ill-appearing.     Comments: Patient appears fatigued, ill-appearing but nontoxic  HENT:     Head: Atraumatic.  Eyes:     Conjunctiva/sclera: Conjunctivae normal.  Cardiovascular:     Rate and Rhythm: Normal rate and regular rhythm.     Pulses: Normal pulses.     Heart sounds: No murmur heard. Pulmonary:     Effort: Pulmonary effort is normal. No respiratory distress.     Breath sounds: Normal breath sounds.  Abdominal:     General: Abdomen is flat. There is no distension.     Palpations: Abdomen is soft.     Tenderness: There is no abdominal tenderness.     Comments: Abdomen is soft, nondistended.  He has moderate tenderness at the epigastric region.  Remainder of exam is nontender to palpation.  Negative CVA tenderness bilaterally.  Negative Murphy sign, negative McBurney  Musculoskeletal:        General: Normal range of motion.     Cervical back: Normal range of motion.  Skin:    General: Skin is warm and dry.     Capillary Refill: Capillary refill takes less than 2 seconds.  Neurological:     General: No focal deficit present.     Mental Status: He is alert.  Psychiatric:        Mood and Affect: Mood normal.     ED Results / Procedures / Treatments   Labs (all labs ordered are listed, but only abnormal results are displayed) Labs Reviewed  LIPASE, BLOOD - Abnormal; Notable for the following components:      Result Value   Lipase 57 (*)    All other components within normal limits  COMPREHENSIVE METABOLIC PANEL - Abnormal; Notable for the following components:   Glucose, Bld 119 (*)    BUN 29 (*)    Creatinine, Ser 1.35 (*)    Total Protein 8.2 (*)    AST  44 (*)    All other components within normal limits  CBC - Abnormal; Notable for the following components:   RBC 3.48 (*)    MCV 114.7 (*)    MCH 39.9 (*)    All other components within normal limits  URINALYSIS, ROUTINE W REFLEX MICROSCOPIC    EKG None  Radiology CT ABDOMEN PELVIS W CONTRAST  Result Date: 04/04/2021 CLINICAL DATA:  Abdominal pain with nausea and vomiting EXAM: CT ABDOMEN AND PELVIS WITH CONTRAST TECHNIQUE: Multidetector CT imaging of the abdomen and pelvis was performed using the standard protocol following bolus administration of intravenous contrast. RADIATION DOSE REDUCTION: This exam was performed according to the departmental dose-optimization program which includes automated exposure control, adjustment of the mA and/or kV according to patient size and/or use of iterative reconstruction technique. CONTRAST:  145mL OMNIPAQUE IOHEXOL 300 MG/ML  SOLN COMPARISON:  CT abdomen and pelvis dated July 06, 2019 FINDINGS: Lower chest: No acute abnormality. Hepatobiliary: No focal liver abnormality is seen. No gallstones, gallbladder wall thickening, or biliary dilatation. Pancreas: Unremarkable. No pancreatic ductal dilatation or surrounding inflammatory changes. Spleen: Normal in size without focal abnormality. Adrenals/Urinary Tract: Bilateral adrenal glands are unremarkable. Kidneys enhance symmetrically with no evidence of hydronephrosis. Low-attenuation lesion of the lower pole of the right kidney which is likely a  simple cyst. Bladder is unremarkable. Stomach/Bowel: Stomach is within normal limits. Appendix not definitely visualized, although there are no secondary findings of acute appendicitis. No evidence of bowel wall thickening, distention, or inflammatory changes. Vascular/Lymphatic: Aortic atherosclerosis. Diffuse moderate narrowing of the left common iliac artery due to calcified and noncalcified plaque. No enlarged abdominal or pelvic lymph nodes. Reproductive: Prostatomegaly. Other: No abdominal wall hernia or abnormality. No abdominopelvic ascites. Musculoskeletal: No acute or significant osseous findings. IMPRESSION: 1. No acute findings in the abdomen or pelvis. 2.  Aortic Atherosclerosis (ICD10-I70.0). Electronically Signed   By: Yetta Glassman M.D.   On: 04/04/2021 14:05    Procedures Procedures    Medications Ordered in ED Medications  iohexol (OMNIPAQUE) 300 MG/ML solution 100 mL (has no administration in time range)  sodium chloride (PF) 0.9 % injection (has no administration in time range)    ED Course/ Medical Decision Making/ A&P Clinical Course as of 04/04/21 1642  Wed Apr 04, 2021  1416 CT ABDOMEN PELVIS W CONTRAST [EC]  1433 Urinalysis, Routine w reflex microscopic Urine, Clean Catch(!) Urine was dark-colored, nearly red.  Large amounts of hemoglobin without red blood cells.  This is consistent with rhabdomyolysis.  I notified my attending, Dr. Godfrey Pick who evaluated the patient himself.  We will order CK and UDS as he suspects rhabdo may have been triggered by drug use.  Patient to receive 2 L lactated ringer fluid bolus. [EC]  1638 CK CK normal [EC]  4235 Rapid urine drug screen (hospital performed)(!) UDS positive for [facility] [EC]    Clinical Course User Index [EC] [name], PA-C                           Medical Decision Making Amount and/or Complexity of Data Reviewed Labs: ordered. Radiology: ordered. Decision-making details documented in ED  Course.  Risk OTC drugs. Prescription drug management.   History:   ZAYLAN KISSOON is a 56 y.o. male who presents to the ED for evaluation of epigastric pain, nausea and vomiting that started last night.  Patient states that this is happened intermittently over the last 20 years  and has been seen several times in the ED for similar symptoms.  Each time he has had a full work-up without any known cause.  He also states that he is seeing a GI specialist and they were unable to figure out why he has this recurrent pain.  Patient states he has not urinated in 2 days and believes this is due to to his constant vomiting and inability to keep fluids down.  He denies fevers, chills, chest pain, cough and congestion.  He is having some diarrhea now.  He denies urinary symptomss External records from outside source obtained and reviewed including previous discharge papers, previous labs and imaging  This patient presents to the ED for concern of epigastric pain, this involves an extensive number of treatment options, and is a complaint that carries with it a high risk of complications and morbidity.   The differential diagnosis for generalized abdominal pain includes, but is not limited to AAA, gastroenteritis, appendicitis, Bowel obstruction, Bowel perforation. Gastroparesis, DKA, Hernia, Inflammatory bowel disease, mesenteric ischemia, pancreatitis, peritonitis SBP, volvulus.   Initial impression:  Patient nontoxic although ill-appearing.  Abdominal exam significant for epigastric tenderness, otherwise nondistended.  Vitals were normal.  At time of evaluation available lab results were as follows: CMP with evidence of AKI with creatinine of 1.35, BUN at 29 Lipase slightly elevated at 57 CBC unremarkable CT scan abdomen pelvis without acute abnormality Patient still is unable to produce urine for sample.  I pulled in a bedside ultrasound and was able to visualize a moderate amount of urine within the  bladder.  I encouraged patient to provide a sample so that we can best evaluate him.  He states that he will try.  Lab Tests and EKG:  I Ordered, reviewed, and interpreted labs and EKG.  The pertinent results in my decision-making regarding them are detailed in the ED course and/or initial impression section above.   Imaging Studies ordered:  I ordered imaging studies including CT abdomen pelvis without acute findings I independently visualized and interpreted imaging and I agree with the radiologist interpretation. Decisions made regarding results are detailed in the ED course and/or initial impression section above.   Medicines ordered and prescription drug management:  I ordered medication including: 2 L lactated ringer bolus for AKI and rhabdomyolysis GI cocktail without lidocaine due to patient's allergy for epigastric pain and vomiting Morphine 4 mg IV for epigastric pain in the setting of rhabdo Zofran 4 mg IV for nausea Reevaluation of the patient after these medicines showed that the patient improved I have reviewed the patients home medicines and have made adjustments as needed  Disposition:  After consideration of the diagnostic results, physical exam, history and the patients response to treatment feel that the patent would benefit from discharge with strict return precautions.   Rhabdomyolysis AKI  Patient's presentation is very mild.  He was given a 2 L fluid bolus with pain management.  I handed this patient off to [place] PA-C at shift change.  At this point, patient is pending completion of his IV fluids.  Courtni will then reassess patient and determine ultimate treatment and disposition, although patient will likely be safe for discharge with strict return precautions.   Final Clinical Impression(s) / ED Diagnoses Final diagnoses:  Non-traumatic rhabdomyolysis  Epigastric pain    Rx / DC Orders ED Discharge Orders     None         [name], [place] 04/04/21 1643  Godfrey Pick, MD 04/04/21 2122

## 2021-04-04 NOTE — ED Notes (Signed)
An After Visit Summary was printed and given to the patient. Discharge instructions given and no further questions at this time.  

## 2021-04-04 NOTE — ED Triage Notes (Signed)
Per pt, states nausea and vomiting since yesterday-some abdominal pain

## 2021-04-04 NOTE — ED Provider Triage Note (Signed)
Emergency Medicine Provider Triage Evaluation Note  Brandon [name] , a 56 y.o. male  was evaluated in triage.  Pt complains of epigastric pain, nausea and vomiting that started last night.  Patient denies known ingestion, recent sick contacts recent travel.  He states this is happened to him before and was told he had pancreatitis although he does not feel that the symptoms are similar.  He has not been able to urinate since yesterday and up so much.  He denies fevers, chills, chest pain, cough, congestion.  He states he was constipated and is now having diarrhea.  No blood noted.  Review of Systems  Positive: Epigastric pain, N/V/D Negative: URI symptoms, fever  Physical Exam  BP (!) 129/94 (BP Location: Left Arm)    Pulse 100    Temp 98 F (36.7 C) (Oral)    Resp 16    SpO2 100%  Gen:   Awake, no distress , ill-appearing Resp:  Normal effort  MSK:   Moves extremities without difficulty  Other:  Abdomen is nodistended.  Patient is TTP epigastrically.  Remainder of exam is nontender nondistended.  Medical Decision Making  Medically screening exam initiated at 10:15 AM.  Appropriate orders placed.  Brandon [name] was informed that the remainder of the evaluation will be completed by another provider, this initial triage assessment does not replace that evaluation, and the importance of remaining in the ED until their evaluation is complete.    [name], [place] 04/04/21 1019

## 2021-04-04 NOTE — Discharge Instructions (Addendum)
Your CT scan today was negative for any acute changes that could be causing your abdominal pain and vomiting. Your labs were reassuring.   You were given a prescription for zofran to help with your nausea. Please take as directed.  Please follow up with your primary doctor within the next 5-7 days.  If you do not have a primary care provider, information for a healthcare clinic has been provided for you to make arrangements for follow up care. Please return to the ER sooner if you have any new or worsening symptoms, or if you have any of the following symptoms:  Abdominal pain that does not go away.  You have a fever.  You keep throwing up (vomiting).  The pain is felt only in portions of the abdomen. Pain in the right side could possibly be appendicitis. In an adult, pain in the left lower portion of the abdomen could be colitis or diverticulitis.  You pass bloody or black tarry stools.  There is bright red blood in the stool.  The constipation stays for more than 4 days.  There is belly (abdominal) or rectal pain.  You do not seem to be getting better.  You have any questions or concerns.

## 2021-06-04 ENCOUNTER — Other Ambulatory Visit: Payer: Self-pay

## 2021-06-04 ENCOUNTER — Emergency Department (HOSPITAL_BASED_OUTPATIENT_CLINIC_OR_DEPARTMENT_OTHER)
Admission: EM | Admit: 2021-06-04 | Discharge: 2021-06-04 | Disposition: A | Discharge status: Home / Self Care | Payer: Medicaid Other | Attending: Emergency Medicine | Admitting: Emergency Medicine

## 2021-06-04 ENCOUNTER — Emergency Department (HOSPITAL_BASED_OUTPATIENT_CLINIC_OR_DEPARTMENT_OTHER): Payer: Medicaid Other

## 2021-06-04 ENCOUNTER — Encounter (HOSPITAL_BASED_OUTPATIENT_CLINIC_OR_DEPARTMENT_OTHER): Payer: Self-pay | Admitting: Emergency Medicine

## 2021-06-04 DIAGNOSIS — R1013 Epigastric pain: Secondary | ICD-10-CM | POA: Diagnosis not present

## 2021-06-04 DIAGNOSIS — Z79899 Other long term (current) drug therapy: Secondary | ICD-10-CM | POA: Insufficient documentation

## 2021-06-04 DIAGNOSIS — N179 Acute kidney failure, unspecified: Secondary | ICD-10-CM | POA: Diagnosis not present

## 2021-06-04 DIAGNOSIS — R000 Tachycardia, unspecified: Secondary | ICD-10-CM | POA: Insufficient documentation

## 2021-06-04 DIAGNOSIS — R112 Nausea with vomiting, unspecified: Secondary | ICD-10-CM | POA: Insufficient documentation

## 2021-06-04 DIAGNOSIS — I10 Essential (primary) hypertension: Secondary | ICD-10-CM | POA: Diagnosis not present

## 2021-06-04 DIAGNOSIS — Z21 Asymptomatic human immunodeficiency virus [HIV] infection status: Secondary | ICD-10-CM | POA: Insufficient documentation

## 2021-06-04 DIAGNOSIS — Z7901 Long term (current) use of anticoagulants: Secondary | ICD-10-CM | POA: Insufficient documentation

## 2021-06-04 LAB — URINALYSIS, ROUTINE W REFLEX MICROSCOPIC
Bilirubin Urine: NEGATIVE
Glucose, UA: NEGATIVE mg/dL
Ketones, ur: NEGATIVE mg/dL
Leukocytes,Ua: NEGATIVE
Nitrite: NEGATIVE
Protein, ur: >300 mg/dL — AB
Specific Gravity, Urine: 1.028 (ref 1.005–1.030)
pH: 6 (ref 5.0–8.0)

## 2021-06-04 LAB — COMPREHENSIVE METABOLIC PANEL
ALT: 18 U/L (ref 0–44)
AST: 31 U/L (ref 15–41)
Albumin: 4.5 g/dL (ref 3.5–5.0)
Alkaline Phosphatase: 67 U/L (ref 38–126)
Anion gap: 9 (ref 5–15)
BUN: 22 mg/dL — ABNORMAL HIGH (ref 6–20)
CO2: 31 mmol/L (ref 22–32)
Calcium: 9.8 mg/dL (ref 8.9–10.3)
Chloride: 98 mmol/L (ref 98–111)
Creatinine, Ser: 1.54 mg/dL — ABNORMAL HIGH (ref 0.61–1.24)
GFR, Estimated: 54 mL/min — ABNORMAL LOW (ref >60)
Glucose, Bld: 100 mg/dL — ABNORMAL HIGH (ref 70–99)
Potassium: 4.2 mmol/L (ref 3.5–5.1)
Sodium: 138 mmol/L (ref 135–145)
Total Bilirubin: 1.4 mg/dL — ABNORMAL HIGH (ref 0.3–1.2)
Total Protein: 7.7 g/dL (ref 6.5–8.1)

## 2021-06-04 LAB — PROTIME-INR
INR: 3 — ABNORMAL HIGH (ref 0.8–1.2)
Prothrombin Time: 31.4 seconds — ABNORMAL HIGH (ref 11.4–15.2)

## 2021-06-04 LAB — CBC
HCT: 41.6 % (ref 39.0–52.0)
Hemoglobin: 14.4 g/dL (ref 13.0–17.0)
MCH: 36.8 pg — ABNORMAL HIGH (ref 26.0–34.0)
MCHC: 34.6 g/dL (ref 30.0–36.0)
MCV: 106.4 fL — ABNORMAL HIGH (ref 80.0–100.0)
Platelets: 246 10*3/uL (ref 150–400)
RBC: 3.91 MIL/uL — ABNORMAL LOW (ref 4.22–5.81)
RDW: 13.3 % (ref 11.5–15.5)
WBC: 4.1 10*3/uL (ref 4.0–10.5)
nRBC: 0 % (ref 0.0–0.2)

## 2021-06-04 LAB — TROPONIN I (HIGH SENSITIVITY)
Troponin I (High Sensitivity): 31 ng/L — ABNORMAL HIGH (ref <18)
Troponin I (High Sensitivity): 28 ng/L — ABNORMAL HIGH (ref <18)

## 2021-06-04 LAB — LIPASE, BLOOD: Lipase: 56 U/L — ABNORMAL HIGH (ref 11–51)

## 2021-06-04 MED ORDER — ONDANSETRON 4 MG PO TBDP
4.0000 mg | ORAL_TABLET | Freq: Once | ORAL | Status: AC | PRN
  Administered 2021-06-04: 4 mg via ORAL
  Filled 2021-06-04: qty 1

## 2021-06-04 MED ORDER — LACTATED RINGERS IV BOLUS
1000.0000 mL | Freq: Once | INTRAVENOUS | Status: AC
  Administered 2021-06-04: 1000 mL via INTRAVENOUS

## 2021-06-04 MED ORDER — METOCLOPRAMIDE HCL 5 MG/ML IJ SOLN
10.0000 mg | Freq: Once | INTRAMUSCULAR | Status: AC
Start: 2021-06-04 — End: 2021-06-04
  Administered 2021-06-04: 10 mg via INTRAVENOUS
  Filled 2021-06-04: qty 2

## 2021-06-04 NOTE — Discharge Instructions (Signed)
Continue taking your home medication for nausea.  Continue your current medications.  If your symptoms worsen return. ?

## 2021-06-04 NOTE — ED Notes (Signed)
Patient verbalizes understanding of discharge instructions. Opportunity for questioning and answers were provided. Patient discharged from ED.  °

## 2021-06-04 NOTE — ED Triage Notes (Signed)
Pt reports waking from sleep today at 3am with nausea and vomiting.  Pt denies diarrhea or taking any OTC medications. Pt states he is unable to eat or drink without vomiting.  Endorses mild epigastric pain.  Pt states h/o recurrent vomiting episodes off and on for 20 yrs, q45days "or so." ?

## 2021-06-04 NOTE — ED Notes (Signed)
Water and peanut butter crackers provided for PO challenge. ?

## 2021-06-04 NOTE — ED Provider Notes (Signed)
?[facility] ?Provider Note ? ? ?CSN: [id number] ?Arrival date & time: 06/04/21  7989 ? ?  ? ?History ? ?Chief Complaint  ?Patient presents with  ? Nausea  ? ? ?Brandon [name] is a 56 y.o. male. ? 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  Patient reports that he felt it coming on last night with some nausea and discomfort and woke up at 3 AM in the morning with severe abdominal pain and then vomiting 4 times between 3 and 6 this morning.  He reports he still feeling nauseated but the abdominal pain is somewhat improved.  He denies any diarrhea.  No fevers.  He reports just feeling generally worn out.  He has had no recent change in his medications.  He denies any NSAID or aspirin use.  He does not drink alcohol.  He does take Zofran daily at home but reports when the vomiting starts nothing will slow it down.   ? ? ? ?  ? ?Home Medications ?Prior to Admission medications   ?Medication Sig Start Date End Date Taking? Authorizing Provider  ?ARIPiprazole (ABILIFY) 5 MG tablet Take 1 tablet by mouth at bedtime. 04/21/20   [provider]  ?dolutegravir (TIVICAY) 50 MG tablet Take 1 tablet (50 mg total) by mouth 2 (two) times daily. 12/15/20   Truman Hayward, MD  ?emtricitabine-tenofovir AF (DESCOVY) 200-25 MG tablet Take 1 tablet by mouth daily. 12/15/20   Truman Hayward, MD  ?EPINEPHrine (EPI-PEN) 0.3 mg/0.3 mL DEVI Inject 0.3 mLs (0.3 mg total) into the muscle once. 08/20/12   Truman Hayward, MD  ?escitalopram (LEXAPRO) 20 MG tablet TAKE 1 TABLET BY MOUTH   DAILY ?Patient taking differently: Take 20 mg by mouth daily. 02/14/16    Truman Hayward, MD  ?fostemsavir tromethamine (RUKOBIA) 600 MG TB12 ER tablet Take 1 tablet by mouth every 12 (twelve) hours. 12/15/20   Truman Hayward, MD  ?furosemide (LASIX) 20 MG tablet Please take one tablet 20 mg by mouth daily as needed for shortness of breath. 01/04/20   [provider]  ?LORazepam (ATIVAN) 1 MG tablet Take 1 mg by mouth at bedtime as needed for anxiety or sleep.    [provider]  ?losartan (COZAAR) 25 MG tablet Take 25 mg by mouth daily. 09/24/19   [provider]  ?metoprolol succinate (TOPROL-XL) 25 MG 24 hr tablet Take 25 mg by mouth daily. 11/18/19   [provider]  ?ondansetron (ZOFRAN) 4 MG tablet Take 1 tablet (4 mg total) by mouth every 6 (six) hours. 04/04/21   Couture, Cortni S, PA-C  ?ondansetron (ZOFRAN-ODT) 8 MG disintegrating tablet Take 1 tablet (8 mg total) by mouth every 8 (eight) hours as needed for nausea or vomiting. 07/13/20   Tommy Medal, Lavell Islam, MD  ?Opium 10 MG/ML (1%) TINC Take 0.6 mLs (6 mg total) by mouth every 6 (six) hours. 07/25/16   Truman Hayward, MD  ?PROMETHEGAN 25 MG suppository Place 25 mg rectally every 6 (six) hours as needed for nausea or vomiting.  10/25/19   [provider]  ?traZODone (DESYREL) 50 MG tablet Take 50 mg by mouth at bedtime.  12/11/18   [provider]  ?valACYclovir (VALTREX) 1000 MG tablet Take 1 tablet (1,000 mg total) by mouth daily. 12/15/20   Truman Hayward, MD  ?warfarin (COUMADIN) 1 MG tablet Take 1 mg by mouth every Friday. Take as directed with '5mg'$  tablet per [facility] on Fridays. 03/24/14   [provider]  ?warfarin (COUMADIN) 4 MG tablet Take by mouth See admin instructions. Takes '5MG'$  and '4MG'$  together ('9MG'$  total) on Sundays and Tuesdays 10/22/19   [provider]  ?warfarin (COUMADIN) 5 MG tablet Take 5 mg by mouth See admin instructions. Takes 5 MG on Monday, Wednesday, Thursday, Friday and Saturday, then 9 MG on Sunday and  Tuesday    [provider]  ?zidovudine (RETROVIR) 300 MG tablet Take 1 tablet (300 mg total) by mouth 2 (two) times daily. 12/15/20   Truman Hayward, MD  ?   ? ?Allergies    ?Bactrim [sulfamethoxazole-trimethoprim], Bee venom, Sulfa antibiotics, Truvada [emtricitabine-tenofovir df], Lidoderm [lidocaine], Raltegravir, Ceftriaxone, and Sulfamethoxazole   ? ?Review of Systems   ?Review of Systems ? ?Physical Exam ?Updated Vital Signs ?BP (!) 152/85   Pulse 77   Temp 98.4 ?F (36.9 ?C) (Oral)   Resp (!) 23   Ht '5\' 8"'$  (1.727 m)   Wt 56.7 kg   SpO2 100%   BMI 19.01 kg/m?  ?Physical Exam ?Vitals and nursing note reviewed.  ?Constitutional:   ?   General: He is not in acute distress. ?   Appearance: He is well-developed.  ?   Comments: Chronically ill-appearing, underweight  ?HENT:  ?   Head: Normocephalic and atraumatic.  ?   Mouth/Throat:  ?   Mouth: Mucous membranes are dry.  ?Eyes:  ?   Conjunctiva/sclera: Conjunctivae normal.  ?   Pupils: Pupils are equal, round, and reactive to light.  ?Cardiovascular:  ?   Rate and Rhythm: Normal rate and regular rhythm.  ?   Heart sounds: No murmur heard. ?Pulmonary:  ?   Effort: Pulmonary effort is normal. No respiratory distress.  ?   Breath sounds: Normal breath sounds. No wheezing or rales.  ?Abdominal:  ?   General: There is no distension.  ?   Palpations: Abdomen is soft.  ?   Tenderness: There is abdominal tenderness. There is no right CVA tenderness, left CVA tenderness, guarding or rebound.  ?   Comments: Tenderness in the epigastric area with a small knot palpated  ?Musculoskeletal:     ?   General: No tenderness. Normal range of motion.  ?   Cervical back: Normal range of motion and neck supple.  ?Skin: ?   General: Skin is warm and dry.  ?   Coloration: Skin is pale.  ?   Findings: No erythema or rash.  ?Neurological:  ?   Mental Status: He is alert and oriented to person, place, and time.  ?Psychiatric:     ?   Behavior: Behavior normal.  ? ? ?ED  Results / Procedures / Treatments   ?Labs ?(all labs ordered are listed, but only abnormal results are displayed) ?Labs Reviewed  ?LIPASE, BLOOD - Abnormal; Notable for the following components:  ?    Result Value  ? Lipase 56 (*)   ? All other components within normal limits  ?COMPREHENSIVE METABOLIC PANEL - Abnormal; Notable for the following components:  ? Glucose, Bld 100 (*)   ? BUN 22 (*)   ? Creatinine, Ser 1.54 (*)   ? Total Bilirubin  1.4 (*)   ? GFR, Estimated 54 (*)   ? All other components within normal limits  ?CBC - Abnormal; Notable for the following components:  ? RBC 3.91 (*)   ? MCV 106.4 (*)   ? MCH 36.8 (*)   ? All other components within normal limits  ?URINALYSIS, ROUTINE W REFLEX MICROSCOPIC - Abnormal; Notable for the following components:  ? Color, Urine ORANGE (*)   ? APPearance HAZY (*)   ? Hgb urine dipstick LARGE (*)   ? Protein, ur >300 (*)   ? Bacteria, UA RARE (*)   ? All other components within normal limits  ?PROTIME-INR - Abnormal; Notable for the following components:  ? Prothrombin Time 31.4 (*)   ? INR 3.0 (*)   ? All other components within normal limits  ?TROPONIN I (HIGH SENSITIVITY) - Abnormal; Notable for the following components:  ? Troponin I (High Sensitivity) 31 (*)   ? All other components within normal limits  ?TROPONIN I (HIGH SENSITIVITY) - Abnormal; Notable for the following components:  ? Troponin I (High Sensitivity) 28 (*)   ? All other components within normal limits  ? ? ?EKG ?EKG Interpretation ? ?Date/Time:  Monday June 04 2021 09:25:37 EDT ?Ventricular Rate:  102 ?PR Interval:  126 ?QRS Duration: 94 ?QT Interval:  376 ?QTC Calculation: 490 ?R Axis:   -36 ?Text Interpretation: Sinus tachycardia Possible Left atrial enlargement Left axis deviation Left ventricular hypertrophy with repolarization abnormality ( R in aVL , Sokolow-Lyon , Cornell product , Romhilt-Estes ) , new T wave inversion Inferior leads When compared with ECG of 31-Dec-2019 00:54, PREVIOUS  ECG IS PRESENT Confirmed by Blanchie Dessert [id number]) on 06/04/2021 10:20:22 AM ? ?Radiology ?CT ABDOMEN PELVIS WO CONTRAST ? ?Result Date: 06/04/2021 ?CLINICAL DATA:  Nausea and vomiting for 2 days. Weight loss. Hi

## 2021-06-04 NOTE — ED Notes (Signed)
Patient transported to CT 

## 2021-06-11 ENCOUNTER — Other Ambulatory Visit: Payer: Self-pay | Admitting: Infectious Disease

## 2021-06-11 DIAGNOSIS — R1115 Cyclical vomiting syndrome unrelated to migraine: Secondary | ICD-10-CM

## 2021-06-11 NOTE — Telephone Encounter (Signed)
Appointment on 4/5 ?

## 2021-06-13 ENCOUNTER — Other Ambulatory Visit: Payer: Self-pay

## 2021-06-13 ENCOUNTER — Other Ambulatory Visit: Payer: Medicaid Other

## 2021-06-13 DIAGNOSIS — B20 Human immunodeficiency virus [HIV] disease: Secondary | ICD-10-CM

## 2021-06-14 LAB — T-HELPER CELL (CD4) - (RCID CLINIC ONLY)
CD4 % Helper T Cell: 25 % — ABNORMAL LOW (ref 33–65)
CD4 T Cell Abs: 341 /uL — ABNORMAL LOW (ref 400–1790)

## 2021-06-18 LAB — LIPID PANEL
Cholesterol: 131 mg/dL (ref <200)
HDL: 49 mg/dL (ref >=40)
LDL Cholesterol (Calc): 62 mg/dL (calc)
Non-HDL Cholesterol (Calc): 82 mg/dL (calc) (ref <130)
Total CHOL/HDL Ratio: 2.7 (calc) (ref <5.0)
Triglycerides: 115 mg/dL (ref <150)

## 2021-06-18 LAB — HIV-1 RNA QUANT-NO REFLEX-BLD
HIV 1 RNA Quant: NOT DETECTED copies/mL
HIV-1 RNA Quant, Log: NOT DETECTED Log copies/mL

## 2021-06-18 LAB — CBC WITH DIFFERENTIAL/PLATELET
Absolute Monocytes: 266 cells/uL (ref 200–950)
Basophils Absolute: 19 cells/uL (ref 0–200)
Basophils Relative: 0.5 %
Eosinophils Absolute: 38 cells/uL (ref 15–500)
Eosinophils Relative: 1 %
HCT: 30.3 % — ABNORMAL LOW (ref 38.5–50.0)
Hemoglobin: 10.7 g/dL — ABNORMAL LOW (ref 13.2–17.1)
Lymphs Abs: 1634 cells/uL (ref 850–3900)
MCH: 39.5 pg — ABNORMAL HIGH (ref 27.0–33.0)
MCHC: 35.3 g/dL (ref 32.0–36.0)
MCV: 111.8 fL — ABNORMAL HIGH (ref 80.0–100.0)
MPV: 11.5 fL (ref 7.5–12.5)
Monocytes Relative: 7 %
Neutro Abs: 1843 cells/uL (ref 1500–7800)
Neutrophils Relative %: 48.5 %
Platelets: 200 10*3/uL (ref 140–400)
RBC: 2.71 10*6/uL — ABNORMAL LOW (ref 4.20–5.80)
RDW: 13.9 % (ref 11.0–15.0)
Total Lymphocyte: 43 %
WBC: 3.8 10*3/uL (ref 3.8–10.8)

## 2021-06-18 LAB — COMPLETE METABOLIC PANEL WITH GFR
AG Ratio: 1.8 (calc) (ref 1.0–2.5)
ALT: 16 U/L (ref 9–46)
AST: 27 U/L (ref 10–35)
Albumin: 3.8 g/dL (ref 3.6–5.1)
Alkaline phosphatase (APISO): 56 U/L (ref 35–144)
BUN/Creatinine Ratio: 12 (calc) (ref 6–22)
BUN: 17 mg/dL (ref 7–25)
CO2: 28 mmol/L (ref 20–32)
Calcium: 8.8 mg/dL (ref 8.6–10.3)
Chloride: 109 mmol/L (ref 98–110)
Creat: 1.47 mg/dL — ABNORMAL HIGH (ref 0.70–1.30)
Globulin: 2.1 g/dL (calc) (ref 1.9–3.7)
Glucose, Bld: 111 mg/dL — ABNORMAL HIGH (ref 65–99)
Potassium: 4.2 mmol/L (ref 3.5–5.3)
Sodium: 141 mmol/L (ref 135–146)
Total Bilirubin: 0.7 mg/dL (ref 0.2–1.2)
Total Protein: 5.9 g/dL — ABNORMAL LOW (ref 6.1–8.1)
eGFR: 57 mL/min/{1.73_m2} — ABNORMAL LOW (ref >=60)

## 2021-06-18 LAB — RPR: RPR Ser Ql: NONREACTIVE

## 2021-06-23 ENCOUNTER — Encounter (HOSPITAL_BASED_OUTPATIENT_CLINIC_OR_DEPARTMENT_OTHER): Payer: Self-pay | Admitting: Emergency Medicine

## 2021-06-23 ENCOUNTER — Emergency Department (HOSPITAL_BASED_OUTPATIENT_CLINIC_OR_DEPARTMENT_OTHER): Payer: Medicaid Other

## 2021-06-23 ENCOUNTER — Other Ambulatory Visit: Payer: Self-pay

## 2021-06-23 ENCOUNTER — Emergency Department (HOSPITAL_BASED_OUTPATIENT_CLINIC_OR_DEPARTMENT_OTHER)
Admission: EM | Admit: 2021-06-23 | Discharge: 2021-06-23 | Disposition: A | Discharge status: Home / Self Care | Payer: Medicaid Other | Attending: Emergency Medicine | Admitting: Emergency Medicine

## 2021-06-23 DIAGNOSIS — G43A0 Cyclical vomiting, not intractable: Secondary | ICD-10-CM | POA: Diagnosis not present

## 2021-06-23 DIAGNOSIS — Z7901 Long term (current) use of anticoagulants: Secondary | ICD-10-CM | POA: Insufficient documentation

## 2021-06-23 DIAGNOSIS — R319 Hematuria, unspecified: Secondary | ICD-10-CM | POA: Insufficient documentation

## 2021-06-23 DIAGNOSIS — Z21 Asymptomatic human immunodeficiency virus [HIV] infection status: Secondary | ICD-10-CM | POA: Insufficient documentation

## 2021-06-23 DIAGNOSIS — D7589 Other specified diseases of blood and blood-forming organs: Secondary | ICD-10-CM | POA: Insufficient documentation

## 2021-06-23 DIAGNOSIS — R1115 Cyclical vomiting syndrome unrelated to migraine: Secondary | ICD-10-CM

## 2021-06-23 DIAGNOSIS — R112 Nausea with vomiting, unspecified: Secondary | ICD-10-CM | POA: Diagnosis present

## 2021-06-23 LAB — URINALYSIS, ROUTINE W REFLEX MICROSCOPIC
Bilirubin Urine: NEGATIVE
Glucose, UA: NEGATIVE mg/dL
Ketones, ur: NEGATIVE mg/dL
Leukocytes,Ua: NEGATIVE
Nitrite: NEGATIVE
Protein, ur: 30 mg/dL — AB
Specific Gravity, Urine: 1.016 (ref 1.005–1.030)
pH: 7 (ref 5.0–8.0)

## 2021-06-23 LAB — CBC WITH DIFFERENTIAL/PLATELET
Abs Immature Granulocytes: 0 10*3/uL (ref 0.00–0.07)
Basophils Absolute: 0 10*3/uL (ref 0.0–0.1)
Basophils Relative: 0 %
Eosinophils Absolute: 0.1 10*3/uL (ref 0.0–0.5)
Eosinophils Relative: 3 %
HCT: 35.4 % — ABNORMAL LOW (ref 39.0–52.0)
Hemoglobin: 12.7 g/dL — ABNORMAL LOW (ref 13.0–17.0)
Immature Granulocytes: 0 %
Lymphocytes Relative: 55 %
Lymphs Abs: 2.8 10*3/uL (ref 0.7–4.0)
MCH: 39.9 pg — ABNORMAL HIGH (ref 26.0–34.0)
MCHC: 35.9 g/dL (ref 30.0–36.0)
MCV: 111.3 fL — ABNORMAL HIGH (ref 80.0–100.0)
Monocytes Absolute: 0.3 10*3/uL (ref 0.1–1.0)
Monocytes Relative: 6 %
Neutro Abs: 1.8 10*3/uL (ref 1.7–7.7)
Neutrophils Relative %: 36 %
Platelets: 187 10*3/uL (ref 150–400)
RBC: 3.18 MIL/uL — ABNORMAL LOW (ref 4.22–5.81)
RDW: 15.7 % — ABNORMAL HIGH (ref 11.5–15.5)
WBC: 5 10*3/uL (ref 4.0–10.5)
nRBC: 0 % (ref 0.0–0.2)

## 2021-06-23 LAB — PROTIME-INR
INR: 2.5 — ABNORMAL HIGH (ref 0.8–1.2)
Prothrombin Time: 26.8 seconds — ABNORMAL HIGH (ref 11.4–15.2)

## 2021-06-23 LAB — COMPREHENSIVE METABOLIC PANEL
ALT: 36 U/L (ref 0–44)
AST: 40 U/L (ref 15–41)
Albumin: 4.3 g/dL (ref 3.5–5.0)
Alkaline Phosphatase: 66 U/L (ref 38–126)
Anion gap: 8 (ref 5–15)
BUN: 14 mg/dL (ref 6–20)
CO2: 27 mmol/L (ref 22–32)
Calcium: 9.3 mg/dL (ref 8.9–10.3)
Chloride: 104 mmol/L (ref 98–111)
Creatinine, Ser: 1.28 mg/dL — ABNORMAL HIGH (ref 0.61–1.24)
GFR, Estimated: >60 mL/min (ref >60)
Glucose, Bld: 121 mg/dL — ABNORMAL HIGH (ref 70–99)
Potassium: 3.8 mmol/L (ref 3.5–5.1)
Sodium: 139 mmol/L (ref 135–145)
Total Bilirubin: 1.1 mg/dL (ref 0.3–1.2)
Total Protein: 7.4 g/dL (ref 6.5–8.1)

## 2021-06-23 LAB — TROPONIN I (HIGH SENSITIVITY)
Troponin I (High Sensitivity): 15 ng/L (ref <18)
Troponin I (High Sensitivity): 20 ng/L — ABNORMAL HIGH (ref <18)

## 2021-06-23 LAB — MAGNESIUM: Magnesium: 1.8 mg/dL (ref 1.7–2.4)

## 2021-06-23 LAB — LIPASE, BLOOD: Lipase: 20 U/L (ref 11–51)

## 2021-06-23 MED ORDER — PROMETHAZINE HCL 25 MG/ML IJ SOLN
INTRAMUSCULAR | Status: AC
  Filled 2021-06-23: qty 1

## 2021-06-23 MED ORDER — METOCLOPRAMIDE HCL 5 MG/ML IJ SOLN
10.0000 mg | Freq: Once | INTRAMUSCULAR | Status: AC
Start: 2021-06-23 — End: 2021-06-23
  Administered 2021-06-23: 10 mg via INTRAVENOUS
  Filled 2021-06-23: qty 2

## 2021-06-23 MED ORDER — ALUM & MAG HYDROXIDE-SIMETH 200-200-20 MG/5ML PO SUSP
30.0000 mL | Freq: Once | ORAL | Status: AC
  Administered 2021-06-23: 30 mL via ORAL
  Filled 2021-06-23: qty 30

## 2021-06-23 MED ORDER — SODIUM CHLORIDE 0.9 % IV SOLN
12.5000 mg | Freq: Once | INTRAVENOUS | Status: AC | PRN
  Administered 2021-06-23: 12.5 mg via INTRAVENOUS
  Filled 2021-06-23: qty 0.5

## 2021-06-23 MED ORDER — LACTATED RINGERS IV BOLUS
500.0000 mL | Freq: Once | INTRAVENOUS | Status: AC
  Administered 2021-06-23: 500 mL via INTRAVENOUS

## 2021-06-23 MED ORDER — IOHEXOL 300 MG/ML  SOLN
100.0000 mL | Freq: Once | INTRAMUSCULAR | Status: AC | PRN
  Administered 2021-06-23: 80 mL via INTRAVENOUS

## 2021-06-23 MED ORDER — LOSARTAN POTASSIUM 25 MG PO TABS
25.0000 mg | ORAL_TABLET | Freq: Every day | ORAL | Status: DC
  Administered 2021-06-23: 25 mg via ORAL
  Filled 2021-06-23: qty 1

## 2021-06-23 MED ORDER — HYDROMORPHONE HCL 1 MG/ML IJ SOLN
0.5000 mg | Freq: Once | INTRAMUSCULAR | Status: AC
  Administered 2021-06-23: 0.5 mg via INTRAVENOUS
  Filled 2021-06-23: qty 1

## 2021-06-23 MED ORDER — METOPROLOL SUCCINATE ER 25 MG PO TB24
25.0000 mg | ORAL_TABLET | Freq: Every day | ORAL | Status: DC
  Administered 2021-06-23: 25 mg via ORAL
  Filled 2021-06-23: qty 1

## 2021-06-23 MED ORDER — ONDANSETRON HCL 4 MG/2ML IJ SOLN
INTRAMUSCULAR | Status: AC
  Administered 2021-06-23: 4 mg
  Filled 2021-06-23: qty 2

## 2021-06-23 MED ORDER — LACTATED RINGERS IV BOLUS
1000.0000 mL | Freq: Once | INTRAVENOUS | Status: AC
Start: 2021-06-23 — End: 2021-06-23
  Administered 2021-06-23: 1000 mL via INTRAVENOUS

## 2021-06-23 MED ORDER — ONDANSETRON HCL 4 MG/2ML IJ SOLN: 4.0000 mg | Freq: Once | INTRAMUSCULAR | Status: AC

## 2021-06-23 MED ORDER — FAMOTIDINE IN NACL 20-0.9 MG/50ML-% IV SOLN
20.0000 mg | Freq: Once | INTRAVENOUS | Status: AC
  Administered 2021-06-23: 20 mg via INTRAVENOUS
  Filled 2021-06-23: qty 50

## 2021-06-23 NOTE — ED Notes (Signed)
Pt's IV Medications are Complete ?

## 2021-06-23 NOTE — ED Notes (Signed)
Dc instructions reviewed with patient. Patient voiced understanding. Dc with belongings.  °

## 2021-06-23 NOTE — ED Provider Notes (Signed)
?[facility] ?Provider Note ? ? ?CSN: [id number] ?Arrival date & time: 06/23/21  0719 ? ?  ? ?History ? ?Chief Complaint  ?Patient presents with  ? Abdominal Pain  ? ? ?Brandon [name] is a 56 y.o. male. ? ? ?Abdominal Pain ?Associated symptoms: nausea and vomiting   ?Patient presenting for abdominal pain.  Medical history includes pancreatitis, HIV, malnutrition, opiate dependence, cyclic vomiting, depression, aortic valve replacement (on Coumadin).  He reports that he was in his normal state of health at the time of going to bed last night.  This morning, at around 4:30 AM, he woke up with nausea and vomiting.  With this, he has experienced epigastric pain.  He has had persistent vomiting has not been able to take any medications at home since onset.  He reports similar episodes in the past.  These were attributed to cyclic vomiting syndrome.  Patient continues to be symptomatic with central abdominal pain, nausea, and active vomiting. ?  ? ?Home Medications ?Prior to Admission medications   ?Medication Sig Start Date End Date Taking? Authorizing Provider  ?ARIPiprazole (ABILIFY) 5 MG tablet Take 1 tablet by mouth at bedtime. 04/21/20   [provider]  ?dolutegravir (TIVICAY) 50 MG tablet Take 1 tablet (50 mg total) by mouth 2 (two) times daily. 12/15/20   Truman Hayward, MD  ?emtricitabine-tenofovir AF (DESCOVY) 200-25 MG tablet Take 1 tablet by mouth daily. 12/15/20   Truman Hayward, MD  ?EPINEPHrine (EPI-PEN) 0.3 mg/0.3 mL DEVI Inject 0.3 mLs (0.3 mg total) into the muscle once. 08/20/12   Truman Hayward, MD  ?escitalopram (LEXAPRO) 20 MG tablet TAKE 1 TABLET BY MOUTH   DAILY ?Patient taking differently: Take 20 mg by mouth daily. 02/14/16   Truman Hayward, MD  ?fostemsavir tromethamine (RUKOBIA) 600 MG TB12 ER tablet Take 1 tablet by mouth every 12 (twelve) hours. 12/15/20   Truman Hayward, MD  ?furosemide (LASIX) 20 MG tablet Please take one  tablet 20 mg by mouth daily as needed for shortness of breath. 01/04/20   [provider]  ?LORazepam (ATIVAN) 1 MG tablet Take 1 mg by mouth at bedtime as needed for anxiety or sleep.    [provider]  ?losartan (COZAAR) 25 MG tablet Take 25 mg by mouth daily. 09/24/19   [provider]  ?metoprolol succinate (TOPROL-XL) 25 MG 24 hr tablet Take 25 mg by mouth daily. 11/18/19   [provider]  ?ondansetron (ZOFRAN) 4 MG tablet Take 1 tablet (4 mg total) by mouth every 6 (six) hours. 04/04/21   Couture, Cortni S, PA-C  ?ondansetron (ZOFRAN-ODT) 8 MG disintegrating tablet Take 1 tablet (8 mg total) by mouth every 8 (eight) hours as needed for nausea or vomiting. 07/13/20   Tommy Medal, Lavell Islam, MD  ?Opium 10 MG/ML (1%) TINC Take 0.6 mLs (6 mg total) by mouth every 6 (six) hours. 07/25/16   Truman Hayward, MD  ?PROMETHEGAN 25 MG suppository Place 25 mg rectally every 6 (six) hours as needed for nausea or vomiting.  10/25/19   [provider]  ?traZODone (DESYREL) 50 MG tablet Take 50 mg by mouth at bedtime.  12/11/18   [provider]  ?valACYclovir (VALTREX) 1000 MG tablet Take 1 tablet (1,000 mg total) by mouth daily. 12/15/20   Truman Hayward, MD  ?warfarin (COUMADIN) 1 MG tablet Take 1 mg by mouth every Friday. Take as directed with '5mg'$  tablet  per [facility] on Fridays. 03/24/14   [provider]  ?warfarin (COUMADIN) 4 MG tablet Take by mouth See admin instructions. Takes '5MG'$  and '4MG'$  together ('9MG'$  total) on Sundays and Tuesdays 10/22/19   [provider]  ?warfarin (COUMADIN) 5 MG tablet Take 5 mg by mouth See admin instructions. Takes 5 MG on Monday, Wednesday, Thursday, Friday and Saturday, then 9 MG on Sunday and Tuesday    [provider]  ?zidovudine (RETROVIR) 300 MG tablet Take 1 tablet (300 mg total) by mouth 2 (two) times daily. 12/15/20   Truman Hayward, MD  ?   ? ?Allergies    ?Bactrim  [sulfamethoxazole-trimethoprim], Bee venom, Sulfa antibiotics, Truvada [emtricitabine-tenofovir df], Lidoderm [lidocaine], Raltegravir, Ceftriaxone, and Sulfamethoxazole   ? ?Review of Systems   ?Review of Systems  ?Gastrointestinal:  Positive for abdominal pain, nausea and vomiting.  ?All other systems reviewed and are negative. ? ?Physical Exam ?Updated Vital Signs ?BP (!) 151/100   Pulse 83   Temp 97.6 ?F (36.4 ?C) (Axillary)   Resp (!) 9   SpO2 100%  ?Physical Exam ?Vitals and nursing note reviewed.  ?Constitutional:   ?   General: He is not in acute distress. ?   Appearance: He is well-developed. He is ill-appearing and diaphoretic. He is not toxic-appearing.  ?HENT:  ?   Head: Normocephalic and atraumatic.  ?   Mouth/Throat:  ?   Mouth: Mucous membranes are moist.  ?   Pharynx: Oropharynx is clear.  ?Eyes:  ?   General: No scleral icterus. ?   Conjunctiva/sclera: Conjunctivae normal.  ?   Pupils: Pupils are equal, round, and reactive to light.  ?Cardiovascular:  ?   Rate and Rhythm: Normal rate and regular rhythm.  ?   Heart sounds: No murmur heard. ?Pulmonary:  ?   Effort: Pulmonary effort is normal. No respiratory distress.  ?   Breath sounds: Normal breath sounds. No wheezing, rhonchi or rales.  ?Chest:  ?   Chest wall: No tenderness.  ?Abdominal:  ?   Palpations: Abdomen is soft.  ?   Tenderness: There is abdominal tenderness in the epigastric area. There is no right CVA tenderness, left CVA tenderness, guarding or rebound.  ?Musculoskeletal:     ?   General: No swelling.  ?   Cervical back: Neck supple.  ?Skin: ?   General: Skin is warm.  ?   Capillary Refill: Capillary refill takes less than 2 seconds.  ?Neurological:  ?   General: No focal deficit present.  ?   Mental Status: He is alert and oriented to person, place, and time.  ?Psychiatric:     ?   Mood and Affect: Mood normal.     ?   Behavior: Behavior normal.  ? ? ?ED Results / Procedures / Treatments   ?Labs ?(all labs ordered are listed, but  only abnormal results are displayed) ?Labs Reviewed  ?COMPREHENSIVE METABOLIC PANEL - Abnormal; Notable for the following components:  ?    Result Value  ? Glucose, Bld 121 (*)   ? Creatinine, Ser 1.28 (*)   ? All other components within normal limits  ?CBC WITH DIFFERENTIAL/PLATELET - Abnormal; Notable for the following components:  ? RBC 3.18 (*)   ? Hemoglobin 12.7 (*)   ? HCT 35.4 (*)   ? MCV 111.3 (*)   ? MCH 39.9 (*)   ? RDW 15.7 (*)   ? All other components within normal limits  ?URINALYSIS, ROUTINE W REFLEX  MICROSCOPIC - Abnormal; Notable for the following components:  ? Hgb urine dipstick LARGE (*)   ? Protein, ur 30 (*)   ? All other components within normal limits  ?PROTIME-INR - Abnormal; Notable for the following components:  ? Prothrombin Time 26.8 (*)   ? INR 2.5 (*)   ? All other components within normal limits  ?LIPASE, BLOOD  ?MAGNESIUM  ?TROPONIN I (HIGH SENSITIVITY)  ?TROPONIN I (HIGH SENSITIVITY)  ? ? ?EKG ?EKG Interpretation ? ?Date/Time:  Saturday June 23 2021 07:57:36 EDT ?Ventricular Rate:  79 ?PR Interval:  146 ?QRS Duration: 108 ?QT Interval:  411 ?QTC Calculation: 472 ?R Axis:   -33 ?Text Interpretation: Sinus rhythm Probable left atrial enlargement RSR' in V1 or V2, probably normal variant Left ventricular hypertrophy Confirmed by Godfrey Pick [phone]) on 06/23/2021 8:11:39 AM ? ?Radiology ?DG Chest Portable 1 View ? ?Result Date: 06/23/2021 ?CLINICAL DATA:  56 year old male with cough and chest pain. EXAM: PORTABLE CHEST 1 VIEW COMPARISON:  Chest radiographs 06/16/2019. FINDINGS: Portable AP upright view at 0811 hours. Chronic sternotomy and cardiac valve replacement. Borderline to mild cardiomegaly appears stable since 2021. Other mediastinal contours are within normal limits. Visualized tracheal air column is within normal limits. Lung volumes at the upper limits of normal. Allowing for portable technique the lungs are clear. No pneumothorax. No pleural effusion. Paucity of bowel gas. No  acute osseous abnormality identified. IMPRESSION: Borderline cardiomegaly and pulmonary hyperinflation. No acute cardiopulmonary abnormality. Electronically Signed   By: Genevie Ann M.D.   On: 06/23/2021 08:37   ? ?Procedures ?Procedures

## 2021-06-27 ENCOUNTER — Other Ambulatory Visit: Payer: Self-pay

## 2021-06-27 ENCOUNTER — Ambulatory Visit (INDEPENDENT_AMBULATORY_CARE_PROVIDER_SITE_OTHER): Payer: Medicaid Other | Admitting: Infectious Disease

## 2021-06-27 ENCOUNTER — Encounter: Payer: Self-pay | Admitting: Infectious Disease

## 2021-06-27 VITALS — BP 165/84 | HR 90 | Temp 98.3°F | Wt 128.8 lb

## 2021-06-27 DIAGNOSIS — Z823 Family history of stroke: Secondary | ICD-10-CM | POA: Insufficient documentation

## 2021-06-27 DIAGNOSIS — B20 Human immunodeficiency virus [HIV] disease: Secondary | ICD-10-CM | POA: Diagnosis not present

## 2021-06-27 DIAGNOSIS — R1115 Cyclical vomiting syndrome unrelated to migraine: Secondary | ICD-10-CM | POA: Diagnosis not present

## 2021-06-27 DIAGNOSIS — Z79899 Other long term (current) drug therapy: Secondary | ICD-10-CM | POA: Diagnosis not present

## 2021-06-27 DIAGNOSIS — Z7901 Long term (current) use of anticoagulants: Secondary | ICD-10-CM

## 2021-06-27 DIAGNOSIS — Z952 Presence of prosthetic heart valve: Secondary | ICD-10-CM

## 2021-06-27 HISTORY — DX: Family history of stroke: Z82.3

## 2021-06-27 MED ORDER — TIVICAY 50 MG PO TABS: 50.0000 mg | ORAL_TABLET | Freq: Two times a day (BID) | ORAL | 11 refills | Status: DC

## 2021-06-27 MED ORDER — PITAVASTATIN CALCIUM 1 MG PO TABS: 1.0000 mg | ORAL_TABLET | Freq: Every day | ORAL | 11 refills | Status: DC

## 2021-06-27 MED ORDER — ZIDOVUDINE 300 MG PO TABS: 300.0000 mg | ORAL_TABLET | Freq: Two times a day (BID) | ORAL | 11 refills | Status: DC

## 2021-06-27 MED ORDER — RUKOBIA 600 MG PO TB12: ORAL_TABLET | ORAL | 11 refills | Status: DC

## 2021-06-27 MED ORDER — DESCOVY 200-25 MG PO TABS: 1.0000 | ORAL_TABLET | Freq: Every day | ORAL | 11 refills | Status: DC

## 2021-06-27 NOTE — Progress Notes (Signed)
? ?Subjective:  ? ?Chief complaint: followup for HIV disease on medicaitons ? ? Patient ID: Brandon [name], male    DOB: 26-Aug-1965, 56 y.o.   MRN: [id number] ? ?HPI ? ? ?56 year old Brandon [name] is a highly complicated man with history of  HIV/AIDS and Multi-DRUG RESISTANT virus formerly followed at [facility]. His HIV nadir was  22 when we first met him ?  ?  ?He had been on various complicated antiretroviral regimens in the past, with unfortunate GENOTYPIC resistance to all non-nucleoside reverse transcriptase inhibitors and all NRTIs, Resistance to all protease inhibitors with the exception of Prezista which had some activity genotypically,, Resistance to Isentress, and Elvitegravir and  reduced S to dolutegravir having both a 148H and 140S  and with Dual tropic virus. ?  ?02/12/2010 phenotype at [facility] showed:  RT: NRTI: ABC, DDI, D4T, AZT, TDF: resistant; 3TC, FTC: susceptible; NNRTI: EFV susceptible; RPV, NVP, ETR, DLV: Resistant; PI: pan-resistant ?  ?He then decided to go back onto [facility] and WAS  referred to [facility]. ?  ?We  Had  seen him and placed him on a  salvage regimen of Prezista $RemoveBef'600mg'JiJTSRfRYj$   Twice daily boosted with Norvir $RemoveBef'100mg'xbVOhjKNXQ$  twice daily, Tivicay twice daily, Combivir twice daily and once daily Viread. ?  ?And since then he haD BEEN WITH AN UNDETECTABLE VIRAL LOAD <20 FOR MORE THAN  four  YEARS  And  Healthy CD4 count. ?  ?Did have one time where his viral load popped into the thousands but there was no resistance seen and he was able to resuppress on : ? ?--Tivicay $RemoveBefo'50mg'TNAOGeZyeFe$  BID ?--Prezista $RemoveBeforeD'600mg'TeqafeqnerBMrQ$  BID with  ?--Norvir $RemoveBe'100mg'EgszLnDzd$  BID ?-AZT  BID ?--DESCOVY q daily ?  ?He continued  to have problems with nausea and vomiting that he attributes to DRV boosted w RTV ?  ?We subsequently switched off DRV/RTV BID for Fostemsavir BID along with continuing the remainder of his salvage regimen. ? ?His nausea had dramatically improved and he states that he does not vomit nearly as frequently as he did before on the  prior regimen.  ? ?He continues to have problems with nausea and vomiting sometimes preceded by loose bowel movement and was in the ER twice with 2 CT scans of the abdomen in the past 2 months. ? ? ? ?Past Medical History:  ?Diagnosis Date  ? Abdominal pain   ? Anemia   ? Arthritis   ? Back pain 02/13/2016  ? Constipation   ? Depression   ? Diarrhea   ? Foot lesion 12/19/2014  ? Gallstones   ? Gastric AVM   ? GERD (gastroesophageal reflux disease)   ? GI bleed   ? HIV (human immunodeficiency virus infection) ([place])   ? Hypertension   ? IBS (irritable bowel syndrome)   ? Infectious colitis   ? Interstitial cystitis   ? Lymphopenia 05/31/2020  ? Mechanical heart valve present   ? Nausea & vomiting   ? Osteopenia 06/02/2018  ? Pancreatitis   ? 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 Stroke [facility])   ? Weight loss, unintentional   ? ? ?Past Surgical History:  ?Procedure Laterality Date  ? AORTIC VALVE REPLACEMENT    ? CARDIAC SURGERY    ? CHOLECYSTECTOMY  02/12/2012  ? Procedure: LAPAROSCOPIC CHOLECYSTECTOMY;  Surgeon: Stark Klein, MD;  Location: [facility];  Service: General;  Laterality: N/A;  ? COLONOSCOPY WITH ESOPHAGOGASTRODUODENOSCOPY (EGD)    ? with polypectomy  ? ESOPHAGOGASTRODUODENOSCOPY N/A 07/24/2012  ?  Procedure: ESOPHAGOGASTRODUODENOSCOPY (EGD);  Surgeon: Beryle Beams, MD;  Location[facility] ENDOSCOPY;  Service: Endoscopy;  Laterality: N/A;  ? KNEE SURGERY    ? MULTIPLE EXTRACTIONS WITH ALVEOLOPLASTY N/A 03/13/2018  ? Procedure: MULTIPLE EXTRACTION;  Surgeon: Diona Browner, DDS;  Location: [place];  Service: Oral Surgery;  Laterality: N/A;  ? ? ?Family History  ?Problem Relation Age of Onset  ? Hypertension Father   ? Prostate cancer Father   ? Stomach cancer Father   ? Hypertension Sister   ? Diabetes Maternal Aunt   ? Cancer - Other Cousin   ? Parkinson's disease Paternal Aunt   ? ? ?  ?Social History  ? ?Socioeconomic History  ? Marital status: Married  ?  Spouse name: Not on file  ? Number of children: 6  ?  Years of education: Not on file  ? Highest education level: Not on file  ?Occupational History  ? Occupation: disability rep  ?Tobacco Use  ? Smoking status: Former  ?  Packs/day: 0.10  ?  Years: 20.00  ?  Pack years: 2.00  ?  Types: Cigars, Cigarettes  ?  Quit date: 08/02/2017  ?  Years since quitting: 3.9  ? Smokeless tobacco: Never  ?Vaping Use  ? Vaping Use: Some days  ? Substances: CBD  ? Devices: CBD vaping  ?Substance and Sexual Activity  ? Alcohol use: Yes  ?  Alcohol/week: 0.0 standard drinks  ?  Comment: rarely   ? Drug use: Yes  ?  Frequency: 7.0 times per week  ?  Types: Marijuana  ? Sexual activity: Yes  ?  Partners: Female  ?  Birth control/protection: Condom  ?  Comment: declined condoms  ?Other Topics Concern  ? Not on file  ?Social History Narrative  ? Not on file  ? ?Social Determinants of Health  ? ?Financial Resource Strain: Not on file  ?Food Insecurity: Not on file  ?Transportation Needs: Not on file  ?Physical Activity: Not on file  ?Stress: Not on file  ?Social Connections: Not on file  ? ? ?Allergies  ?Allergen Reactions  ? Bactrim [Sulfamethoxazole-Trimethoprim]   ? Bee Venom Anaphylaxis  ? Sulfa Antibiotics Anaphylaxis  ? Truvada [Emtricitabine-Tenofovir Df] Anaphylaxis and Rash  ?  Takes plain tenofovir at home  ? Lidoderm [Lidocaine] Other (See Comments)  ?  Reaction unknown  ? Raltegravir   ?  resistance  ? Ceftriaxone Rash  ? Sulfamethoxazole Itching, Other (See Comments) and Rash  ?  Other reaction(s): Hypotension (ALLERGY/intolerance)  ? ? ? ?Current Outpatient Medications:  ?  ARIPiprazole (ABILIFY) 5 MG tablet, Take 1 tablet by mouth at bedtime., Disp: , Rfl:  ?  dolutegravir (TIVICAY) 50 MG tablet, Take 1 tablet (50 mg total) by mouth 2 (two) times daily., Disp: 60 tablet, Rfl: 11 ?  emtricitabine-tenofovir AF (DESCOVY) 200-25 MG tablet, Take 1 tablet by mouth daily., Disp: 30 tablet, Rfl: 11 ?  EPINEPHrine (EPI-PEN) 0.3 mg/0.3 mL DEVI, Inject 0.3 mLs (0.3 mg total) into the muscle  once., Disp: 1 Device, Rfl: 1 ?  escitalopram (LEXAPRO) 20 MG tablet, TAKE 1 TABLET BY MOUTH   DAILY (Patient taking differently: Take 20 mg by mouth daily.), Disp: 30 tablet, Rfl: 1 ?  fostemsavir tromethamine (RUKOBIA) 600 MG TB12 ER tablet, Take 1 tablet by mouth every 12 (twelve) hours., Disp: 60 tablet, Rfl: 11 ?  furosemide (LASIX) 20 MG tablet, Please take one tablet 20 mg by mouth daily as needed for shortness of breath., Disp: ,  Rfl:  ?  LORazepam (ATIVAN) 1 MG tablet, Take 1 mg by mouth at bedtime as needed for anxiety or sleep., Disp: , Rfl:  ?  losartan (COZAAR) 25 MG tablet, Take 25 mg by mouth daily., Disp: , Rfl:  ?  metoprolol succinate (TOPROL-XL) 25 MG 24 hr tablet, Take 25 mg by mouth daily., Disp: , Rfl:  ?  ondansetron (ZOFRAN) 4 MG tablet, Take 1 tablet (4 mg total) by mouth every 6 (six) hours., Disp: 12 tablet, Rfl: 0 ?  ondansetron (ZOFRAN-ODT) 8 MG disintegrating tablet, Take 1 tablet (8 mg total) by mouth every 8 (eight) hours as needed for nausea or vomiting., Disp: 30 tablet, Rfl: 5 ?  polyethylene glycol powder (GLYCOLAX/MIRALAX) 17 GM/SCOOP powder, Take 17 g by mouth daily., Disp: , Rfl:  ?  PROMETHEGAN 25 MG suppository, Place 25 mg rectally every 6 (six) hours as needed for nausea or vomiting. , Disp: , Rfl:  ?  traZODone (DESYREL) 50 MG tablet, Take 50 mg by mouth at bedtime. , Disp: , Rfl:  ?  valACYclovir (VALTREX) 1000 MG tablet, Take 1 tablet (1,000 mg total) by mouth daily., Disp: 30 tablet, Rfl: 11 ?  warfarin (COUMADIN) 1 MG tablet, Take 1 mg by mouth every Friday. Take as directed with 10m tablet per [facility] on Fridays., Disp: , Rfl:  ?  warfarin (COUMADIN) 4 MG tablet, Take by mouth See admin instructions. Takes 5MG and 4MG together (9MG total) on Sundays and Tuesdays, Disp: , Rfl:  ?  warfarin (COUMADIN) 5 MG tablet, Take 5 mg by mouth See admin instructions. Takes 5 MG on Monday, Wednesday, Thursday, Friday and Saturday, then 9 MG on Sunday and Tuesday, Disp: ,  Rfl:  ?  zidovudine (RETROVIR) 300 MG tablet, Take 1 tablet (300 mg total) by mouth 2 (two) times daily., Disp: 60 tablet, Rfl: 11 ?  Opium 10 MG/ML (1%) TINC, Take 0.6 mLs (6 mg total) by mouth every 6 (

## 2021-06-29 ENCOUNTER — Telehealth: Payer: Self-pay

## 2021-06-29 NOTE — Telephone Encounter (Signed)
PA approved, approval faxed to [facility].  ? ?PA Case: [id number], Status: Approved, Coverage Starts on: 06/29/2021 12:00:00 AM, Coverage Ends on: 06/29/2022 12:00:00 AM. ? ?Beryle Flock, RN ? ?

## 2021-06-29 NOTE — Telephone Encounter (Signed)
PA for Pitavastatin submitted via cover my meds 06/29/21. Awaiting response. ? ?Key: [id number] ? ?Beryle Flock, RN ? ?

## 2021-08-18 ENCOUNTER — Emergency Department (HOSPITAL_BASED_OUTPATIENT_CLINIC_OR_DEPARTMENT_OTHER): Payer: Medicaid Other

## 2021-08-18 ENCOUNTER — Other Ambulatory Visit: Payer: Self-pay

## 2021-08-18 ENCOUNTER — Encounter (HOSPITAL_BASED_OUTPATIENT_CLINIC_OR_DEPARTMENT_OTHER): Payer: Self-pay

## 2021-08-18 ENCOUNTER — Emergency Department (HOSPITAL_BASED_OUTPATIENT_CLINIC_OR_DEPARTMENT_OTHER)
Admission: EM | Admit: 2021-08-18 | Discharge: 2021-08-18 | Disposition: A | Discharge status: Home / Self Care | Payer: Medicaid Other | Attending: Emergency Medicine | Admitting: Emergency Medicine

## 2021-08-18 DIAGNOSIS — F1220 Cannabis dependence, uncomplicated: Secondary | ICD-10-CM | POA: Insufficient documentation

## 2021-08-18 DIAGNOSIS — Z7902 Long term (current) use of antithrombotics/antiplatelets: Secondary | ICD-10-CM | POA: Diagnosis not present

## 2021-08-18 DIAGNOSIS — R1013 Epigastric pain: Secondary | ICD-10-CM | POA: Insufficient documentation

## 2021-08-18 DIAGNOSIS — Z21 Asymptomatic human immunodeficiency virus [HIV] infection status: Secondary | ICD-10-CM | POA: Insufficient documentation

## 2021-08-18 DIAGNOSIS — R000 Tachycardia, unspecified: Secondary | ICD-10-CM | POA: Insufficient documentation

## 2021-08-18 DIAGNOSIS — R791 Abnormal coagulation profile: Secondary | ICD-10-CM | POA: Diagnosis not present

## 2021-08-18 DIAGNOSIS — K219 Gastro-esophageal reflux disease without esophagitis: Secondary | ICD-10-CM | POA: Insufficient documentation

## 2021-08-18 DIAGNOSIS — R748 Abnormal levels of other serum enzymes: Secondary | ICD-10-CM | POA: Diagnosis not present

## 2021-08-18 DIAGNOSIS — Z79899 Other long term (current) drug therapy: Secondary | ICD-10-CM | POA: Diagnosis not present

## 2021-08-18 DIAGNOSIS — F1290 Cannabis use, unspecified, uncomplicated: Secondary | ICD-10-CM

## 2021-08-18 DIAGNOSIS — R112 Nausea with vomiting, unspecified: Secondary | ICD-10-CM | POA: Insufficient documentation

## 2021-08-18 DIAGNOSIS — I10 Essential (primary) hypertension: Secondary | ICD-10-CM | POA: Insufficient documentation

## 2021-08-18 DIAGNOSIS — R1115 Cyclical vomiting syndrome unrelated to migraine: Secondary | ICD-10-CM

## 2021-08-18 LAB — COMPREHENSIVE METABOLIC PANEL
ALT: 25 U/L (ref 0–44)
AST: 33 U/L (ref 15–41)
Albumin: 4.6 g/dL (ref 3.5–5.0)
Alkaline Phosphatase: 87 U/L (ref 38–126)
Anion gap: 9 (ref 5–15)
BUN: 13 mg/dL (ref 6–20)
CO2: 27 mmol/L (ref 22–32)
Calcium: 9.9 mg/dL (ref 8.9–10.3)
Chloride: 103 mmol/L (ref 98–111)
Creatinine, Ser: 1.11 mg/dL (ref 0.61–1.24)
GFR, Estimated: >60 mL/min (ref >60)
Glucose, Bld: 132 mg/dL — ABNORMAL HIGH (ref 70–99)
Potassium: 3.8 mmol/L (ref 3.5–5.1)
Sodium: 139 mmol/L (ref 135–145)
Total Bilirubin: 1.3 mg/dL — ABNORMAL HIGH (ref 0.3–1.2)
Total Protein: 7.8 g/dL (ref 6.5–8.1)

## 2021-08-18 LAB — LACTIC ACID, PLASMA: Lactic Acid, Venous: 1.6 mmol/L (ref 0.5–1.9)

## 2021-08-18 LAB — PROTIME-INR
INR: 2.5 — ABNORMAL HIGH (ref 0.8–1.2)
Prothrombin Time: 26.5 seconds — ABNORMAL HIGH (ref 11.4–15.2)

## 2021-08-18 LAB — CBC WITH DIFFERENTIAL/PLATELET
Abs Immature Granulocytes: 0.01 10*3/uL (ref 0.00–0.07)
Basophils Absolute: 0.1 10*3/uL (ref 0.0–0.1)
Basophils Relative: 1 %
Eosinophils Absolute: 0.2 10*3/uL (ref 0.0–0.5)
Eosinophils Relative: 4 %
HCT: 37.5 % — ABNORMAL LOW (ref 39.0–52.0)
Hemoglobin: 12.9 g/dL — ABNORMAL LOW (ref 13.0–17.0)
Immature Granulocytes: 0 %
Lymphocytes Relative: 47 %
Lymphs Abs: 2.9 10*3/uL (ref 0.7–4.0)
MCH: 38.7 pg — ABNORMAL HIGH (ref 26.0–34.0)
MCHC: 34.4 g/dL (ref 30.0–36.0)
MCV: 112.6 fL — ABNORMAL HIGH (ref 80.0–100.0)
Monocytes Absolute: 0.6 10*3/uL (ref 0.1–1.0)
Monocytes Relative: 9 %
Neutro Abs: 2.4 10*3/uL (ref 1.7–7.7)
Neutrophils Relative %: 39 %
Platelets: 217 10*3/uL (ref 150–400)
RBC: 3.33 MIL/uL — ABNORMAL LOW (ref 4.22–5.81)
RDW: 14.6 % (ref 11.5–15.5)
Smear Review: ADEQUATE
WBC: 6.2 10*3/uL (ref 4.0–10.5)
nRBC: 0 % (ref 0.0–0.2)

## 2021-08-18 LAB — RAPID URINE DRUG SCREEN, HOSP PERFORMED
Amphetamines: NOT DETECTED
Barbiturates: NOT DETECTED
Benzodiazepines: NOT DETECTED
Cocaine: NOT DETECTED
Opiates: NOT DETECTED
Tetrahydrocannabinol: POSITIVE — AB

## 2021-08-18 LAB — URINALYSIS, ROUTINE W REFLEX MICROSCOPIC
Bilirubin Urine: NEGATIVE
Glucose, UA: 100 mg/dL — AB
Ketones, ur: NEGATIVE mg/dL
Leukocytes,Ua: NEGATIVE
Nitrite: NEGATIVE
Protein, ur: NEGATIVE mg/dL
Specific Gravity, Urine: 1.022 (ref 1.005–1.030)
pH: 7.5 (ref 5.0–8.0)

## 2021-08-18 LAB — TROPONIN I (HIGH SENSITIVITY)
Troponin I (High Sensitivity): 20 ng/L — ABNORMAL HIGH (ref <18)
Troponin I (High Sensitivity): 18 ng/L — ABNORMAL HIGH (ref <18)

## 2021-08-18 LAB — LIPASE, BLOOD: Lipase: 16 U/L (ref 11–51)

## 2021-08-18 MED ORDER — LACTATED RINGERS IV BOLUS
500.0000 mL | Freq: Once | INTRAVENOUS | Status: AC
  Administered 2021-08-18: 500 mL via INTRAVENOUS

## 2021-08-18 MED ORDER — CAPSAICIN 0.075 % EX CREA
TOPICAL_CREAM | Freq: Two times a day (BID) | CUTANEOUS | Status: DC
Start: 2021-08-18 — End: 2021-08-18
  Filled 2021-08-18: qty 60

## 2021-08-18 MED ORDER — FAMOTIDINE IN NACL 20-0.9 MG/50ML-% IV SOLN
20.0000 mg | Freq: Once | INTRAVENOUS | Status: AC
  Administered 2021-08-18: 20 mg via INTRAVENOUS
  Filled 2021-08-18: qty 50

## 2021-08-18 MED ORDER — LABETALOL HCL 5 MG/ML IV SOLN
20.0000 mg | Freq: Once | INTRAVENOUS | Status: AC
  Administered 2021-08-18: 20 mg via INTRAVENOUS
  Filled 2021-08-18: qty 4

## 2021-08-18 MED ORDER — LOSARTAN POTASSIUM 25 MG PO TABS
25.0000 mg | ORAL_TABLET | Freq: Every day | ORAL | Status: DC
  Administered 2021-08-18: 25 mg via ORAL
  Filled 2021-08-18: qty 1

## 2021-08-18 MED ORDER — FENTANYL CITRATE PF 50 MCG/ML IJ SOSY
50.0000 ug | PREFILLED_SYRINGE | Freq: Once | INTRAMUSCULAR | Status: AC
  Administered 2021-08-18: 50 ug via INTRAVENOUS
  Filled 2021-08-18: qty 1

## 2021-08-18 MED ORDER — METOPROLOL SUCCINATE ER 25 MG PO TB24
25.0000 mg | ORAL_TABLET | Freq: Every day | ORAL | Status: DC
  Administered 2021-08-18: 25 mg via ORAL
  Filled 2021-08-18: qty 1

## 2021-08-18 MED ORDER — SODIUM CHLORIDE 0.9 % IV BOLUS
1000.0000 mL | Freq: Once | INTRAVENOUS | Status: AC
  Administered 2021-08-18: 1000 mL via INTRAVENOUS

## 2021-08-18 MED ORDER — DROPERIDOL 2.5 MG/ML IJ SOLN
2.5000 mg | Freq: Once | INTRAMUSCULAR | Status: AC
  Administered 2021-08-18: 2.5 mg via INTRAVENOUS
  Filled 2021-08-18: qty 2

## 2021-08-18 MED ORDER — IOHEXOL 350 MG/ML SOLN
100.0000 mL | Freq: Once | INTRAVENOUS | Status: AC | PRN
  Administered 2021-08-18: 100 mL via INTRAVENOUS

## 2021-08-18 MED ORDER — ONDANSETRON HCL 4 MG/2ML IJ SOLN
4.0000 mg | Freq: Once | INTRAMUSCULAR | Status: AC
  Administered 2021-08-18: 4 mg via INTRAVENOUS
  Filled 2021-08-18: qty 2

## 2021-08-18 MED ORDER — SODIUM CHLORIDE 0.9 % IV SOLN: INTRAVENOUS | Status: DC

## 2021-08-18 MED ORDER — ALUM & MAG HYDROXIDE-SIMETH 200-200-20 MG/5ML PO SUSP
30.0000 mL | Freq: Once | ORAL | Status: AC
  Administered 2021-08-18: 30 mL via ORAL
  Filled 2021-08-18: qty 30

## 2021-08-18 NOTE — ED Provider Notes (Signed)
[facility] EMERGENCY DEPT Provider Note   CSN: [id number] Arrival date & time: 08/18/21  6294     History  Chief Complaint  Patient presents with   Abdominal Pain   Nausea    Brandon [name] is a 56 y.o. male.   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  He does endorse frequent cannabis use.  He has a history of pancreatitis.  He endorses epigastric pain with associated nausea, no radiation of the pain.  He denies any chest pain.  On arrival, the patient was diaphoretic and uncomfortable appearing.  He has had persistent vomiting and has not been able to take any of his home medications.  He has had similar episodes of this in the past and was last seen in the emergency department on 06/23/2021 with similar presentation.  Home Medications Prior to Admission medications   Medication Sig Start Date End Date Taking? Authorizing Provider  ARIPiprazole (ABILIFY) 5 MG tablet Take 1 tablet by mouth at bedtime. 04/21/20   [provider]  dolutegravir (TIVICAY) 50 MG tablet Take 1 tablet (50 mg total) by mouth 2 (two) times daily. 06/27/21   Truman Hayward, MD  emtricitabine-tenofovir AF (DESCOVY) 200-25 MG tablet Take 1 tablet by mouth daily. 06/27/21   Truman Hayward, MD  EPINEPHrine (EPI-PEN) 0.3 mg/0.3 mL DEVI Inject 0.3 mLs (0.3 mg total) into the muscle once. 08/20/12   Truman Hayward, MD  escitalopram (LEXAPRO) 20 MG tablet TAKE 1 TABLET BY MOUTH   DAILY Patient taking differently: Take 20 mg by mouth daily. 02/14/16   Truman Hayward, MD  fostemsavir tromethamine (RUKOBIA) 600 MG TB12 ER tablet Take 1 tablet by mouth every 12 (twelve) hours. 06/27/21   Truman Hayward, MD  furosemide (LASIX)  20 MG tablet Please take one tablet 20 mg by mouth daily as needed for shortness of breath. 01/04/20   [provider]  LORazepam (ATIVAN) 1 MG tablet Take 1 mg by mouth at bedtime as needed for anxiety or sleep.    [provider]  losartan (COZAAR) 25 MG tablet Take 25 mg by mouth daily. 09/24/19   [provider]  metoprolol succinate (TOPROL-XL) 25 MG 24 hr tablet Take 25 mg by mouth daily. 11/18/19   [provider]  ondansetron (ZOFRAN) 4 MG tablet Take 1 tablet (4 mg total) by mouth every 6 (six) hours. 04/04/21   Couture, Cortni S, PA-C  ondansetron (ZOFRAN-ODT) 8 MG disintegrating tablet Take 1 tablet (8 mg total) by mouth every 8 (eight) hours as needed for nausea or vomiting. 07/13/20   Tommy Medal, Lavell Islam, MD  Opium 10 MG/ML (1%) TINC Take 0.6 mLs (6 mg total) by mouth every 6 (six) hours. Patient not taking: Reported on 06/27/2021 07/25/16   Tommy Medal, Lavell Islam, MD  Pitavastatin Calcium 1 MG TABS Take 1 tablet (1 mg total) by mouth daily. 06/27/21   Truman Hayward, MD  polyethylene glycol powder [facility]) 17 GM/SCOOP powder Take 17 g by mouth daily. 06/19/21   [provider]  PROMETHEGAN 25 MG suppository Place 25 mg rectally every 6 (six) hours as needed for nausea or vomiting.  10/25/19   [provider]  traZODone (DESYREL) 50 MG tablet  Take 50 mg by mouth at bedtime.  12/11/18   [provider]  valACYclovir (VALTREX) 1000 MG tablet Take 1 tablet (1,000 mg total) by mouth daily. 12/15/20   Truman Hayward, MD  warfarin (COUMADIN) 1 MG tablet Take 1 mg by mouth every Friday. Take as directed with '5mg'$  tablet per [facility] on Fridays. 03/24/14   [provider]  warfarin (COUMADIN) 4 MG tablet Take by mouth See admin instructions. Takes '5MG'$  and '4MG'$  together ('9MG'$  total) on Sundays and Tuesdays 10/22/19   [provider]  warfarin (COUMADIN) 5 MG tablet Take 5 mg by mouth See admin instructions.  Takes 5 MG on Monday, Wednesday, Thursday, Friday and Saturday, then 9 MG on Sunday and Tuesday    [provider]  zidovudine (RETROVIR) 300 MG tablet Take 1 tablet (300 mg total) by mouth 2 (two) times daily. 06/27/21   Truman Hayward, MD      Allergies    Bactrim [sulfamethoxazole-trimethoprim], Bee venom, Sulfa antibiotics, Truvada [emtricitabine-tenofovir df], Lidoderm [lidocaine], Raltegravir, Ceftriaxone, and Sulfamethoxazole    Review of Systems   Review of Systems  Gastrointestinal:  Positive for abdominal pain.  All other systems reviewed and are negative.   Physical Exam Updated Vital Signs BP (!) 179/94   Pulse (!) 108   Temp (!) 97 F (36.1 C) (Oral)   Resp (!) 22   Ht '5\' 8"'$  (1.727 m)   Wt 59 kg   SpO2 100%   BMI 19.77 kg/m  Physical Exam Vitals and nursing note reviewed.  Constitutional:      General: He is in acute distress.     Appearance: He is ill-appearing and diaphoretic.  HENT:     Head: Normocephalic and atraumatic.  Eyes:     Conjunctiva/sclera: Conjunctivae normal.     Pupils: Pupils are equal, round, and reactive to light.  Cardiovascular:     Rate and Rhythm: Regular rhythm. Tachycardia present.  Pulmonary:     Effort: Pulmonary effort is normal. No respiratory distress.  Abdominal:     General: There is no distension.     Tenderness: There is abdominal tenderness in the epigastric area. There is guarding.  Musculoskeletal:        General: No deformity or signs of injury.     Cervical back: Neck supple.  Skin:    Findings: No lesion or rash.  Neurological:     General: No focal deficit present.     Mental Status: He is alert. Mental status is at baseline.     ED Results / Procedures / Treatments   Labs (all labs ordered are listed, but only abnormal results are displayed) Labs Reviewed  COMPREHENSIVE METABOLIC PANEL - Abnormal; Notable for the following components:      Result Value   Glucose, Bld 132 (*)    Total  Bilirubin 1.3 (*)    All other components within normal limits  CBC WITH DIFFERENTIAL/PLATELET - Abnormal; Notable for the following components:   RBC 3.33 (*)    Hemoglobin 12.9 (*)    HCT 37.5 (*)    MCV 112.6 (*)    MCH 38.7 (*)    All other components within normal limits  URINALYSIS, ROUTINE W REFLEX MICROSCOPIC - Abnormal; Notable for the following components:   Color, Urine COLORLESS (*)    Glucose, UA 100 (*)    Hgb urine dipstick SMALL (*)    All other components within normal limits  RAPID URINE DRUG SCREEN,  HOSP PERFORMED - Abnormal; Notable for the following components:   Tetrahydrocannabinol POSITIVE (*)    All other components within normal limits  PROTIME-INR - Abnormal; Notable for the following components:   Prothrombin Time 26.5 (*)    INR 2.5 (*)    All other components within normal limits  TROPONIN I (HIGH SENSITIVITY) - Abnormal; Notable for the following components:   Troponin I (High Sensitivity) 20 (*)    All other components within normal limits  TROPONIN I (HIGH SENSITIVITY) - Abnormal; Notable for the following components:   Troponin I (High Sensitivity) 18 (*)    All other components within normal limits  LIPASE, BLOOD  LACTIC ACID, PLASMA    EKG EKG Interpretation  Date/Time:  Saturday August 18 2021 08:54:09 EDT Ventricular Rate:  85 PR Interval:  153 QRS Duration: 108 QT Interval:  415 QTC Calculation: 494 R Axis:   -32 Text Interpretation: Sinus rhythm Biatrial enlargement RSR' in V1 or V2, probably normal variant LVH with secondary repolarization abnormality Borderline prolonged QT interval Confirmed by Regan Lemming ([id number]) on 08/18/2021 9:10:59 AM  Radiology CT Angio Chest/Abd/Pel for Dissection W and/or Wo Contrast  Result Date: 08/18/2021 CLINICAL DATA:  Acute aortic syndrome suspected. Complains of abdominal pain, nausea and vomiting. EXAM: CT ANGIOGRAPHY CHEST, ABDOMEN AND PELVIS TECHNIQUE: Non-contrast CT of the chest was initially  obtained. Multidetector CT imaging through the chest, abdomen and pelvis was performed using the standard protocol during bolus administration of intravenous contrast. Multiplanar reconstructed images and MIPs were obtained and reviewed to evaluate the vascular anatomy. RADIATION DOSE REDUCTION: This exam was performed according to the departmental dose-optimization program which includes automated exposure control, adjustment of the mA and/or kV according to patient size and/or use of iterative reconstruction technique. CONTRAST:  162m OMNIPAQUE IOHEXOL 350 MG/ML SOLN COMPARISON:  CT AP 06/23/2021 FINDINGS: CTA CHEST FINDINGS Cardiovascular: Preferential opacification of the thoracic aorta. No evidence for thoracic aortic dissection. Mild aneurysmal dilatation of the ascending thoracic aorta measures 4.1 cm, image 79/8. Aortic atherosclerotic calcifications noted. Status post aortic valve repair heart size is upper limits of normal. No pericardial effusion. Mediastinum/Nodes: No enlarged axillary, supraclavicular, mediastinal or hilar lymph nodes. Thyroid gland, trachea and esophagus demonstrate no significant findings. Lungs/Pleura: No pleural effusion, airspace consolidation, atelectasis, or pneumothorax. Centrilobular emphysema. There is interlobular septal thickening within the lung bases concerning for mild interstitial edema. No suspicious pulmonary nodule or mass. Musculoskeletal: No chest wall abnormality. No acute or significant osseous findings. Review of the MIP images confirms the above findings. CTA ABDOMEN AND PELVIS FINDINGS VASCULAR Aorta: Normal caliber aorta without aneurysm, dissection, vasculitis or significant stenosis. Aortic atherosclerotic calcifications noted. There is extensive calcified and noncalcified plaque at the left common iliac artery with approximately 50% stenosis. Celiac: Patent without evidence of aneurysm, dissection, vasculitis or significant stenosis. SMA: Patent without  evidence of aneurysm, dissection, vasculitis or significant stenosis. Renals: Both renal arteries are patent without evidence of aneurysm, dissection, vasculitis, fibromuscular dysplasia or significant stenosis. IMA: Patent without evidence of aneurysm, dissection, vasculitis or significant stenosis. Inflow: Patent without evidence of aneurysm, dissection, vasculitis or significant stenosis. Veins: No obvious venous abnormality within the limitations of this arterial phase study. Review of the MIP images confirms the above findings. NON-VASCULAR Hepatobiliary: No focal liver abnormality is seen. Status post cholecystectomy. No biliary dilatation. Pancreas: Unremarkable. No pancreatic ductal dilatation or surrounding inflammatory changes. Spleen: Normal in size without focal abnormality. Adrenals/Urinary Tract: Normal adrenal glands. Benign Bosniak class 1 cyst arising off  the lower pole of right kidney measures 1.1 cm. No follow-up recommended. Urinary bladder is unremarkable. Stomach/Bowel: No pathologic dilatation of the large or small bowel loops. Evaluation of bowel pathology is limited however due to lack of enteric contrast material and arterial phase of contrast opacification. A moderate stool burden is identified within the sigmoid colon and rectum. Lymphatic: No signs of abdominopelvic adenopathy. Reproductive: Prostate is unremarkable. Other: No free fluid or fluid collections. No signs of pneumoperitoneum. Musculoskeletal: No acute or significant osseous findings. Review of the MIP images confirms the above findings. IMPRESSION: 1. No evidence for aortic dissection. 2. 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 Recommend annual imaging followup by CTA or MRA. This recommendation follows 2010 ACCF/AHA/AATS/ACR/ASA/SCA/SCAI/SIR/STS/SVM Guidelines for the Diagnosis and Management of Patients with Thoracic Aortic Disease. Circulation. 2010; 121: X735-H299. Aortic aneurysm NOS  (ICD10-I71.9) 3. Status post aortic valve repair. 4. Aortic Atherosclerosis (ICD10-I70.0) and Emphysema (ICD10-J43.9). Electronically Signed   By: Kerby Moors M.D.   On: 08/18/2021 10:30    Procedures Procedures    Medications Ordered in ED Medications  sodium chloride 0.9 % bolus 1,000 mL (1,000 mLs Intravenous New Bag/Given 08/18/21 0846)  fentaNYL (SUBLIMAZE) injection 50 mcg (50 mcg Intravenous Given 08/18/21 0846)  ondansetron (ZOFRAN) injection 4 mg (4 mg Intravenous Given 08/18/21 0846)  iohexol (OMNIPAQUE) 350 MG/ML injection 100 mL (100 mLs Intravenous Contrast Given 08/18/21 0951)  labetalol (NORMODYNE) injection 20 mg (20 mg Intravenous Given 08/18/21 1019)  labetalol (NORMODYNE) injection 20 mg (20 mg Intravenous Given 08/18/21 1107)  droperidol (INAPSINE) 2.5 MG/ML injection 2.5 mg (2.5 mg Intravenous Given 08/18/21 1108)  alum & mag hydroxide-simeth (MAALOX/MYLANTA) 200-200-20 MG/5ML suspension 30 mL (30 mLs Oral Given 08/18/21 1108)  famotidine (PEPCID) IVPB 20 mg premix (20 mg Intravenous New Bag/Given 08/18/21 1302)  lactated ringers bolus 500 mL (500 mLs Intravenous New Bag/Given 08/18/21 1301)    ED Course/ Medical Decision Making/ A&P Clinical Course as of 08/19/21 0846  Sat Aug 18, 2021  1152 Tetrahydrocannabinol(!): POSITIVE [JL]  1248 Troponin I (High Sensitivity)(!): 18 [JL]    Clinical Course User Index [JL] Regan Lemming, MD                           Medical Decision Making Amount and/or Complexity of Data Reviewed Labs: ordered. Decision-making details documented in ED Course. Radiology: ordered.  Risk OTC drugs. Prescription drug management.   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  He does endorse frequent cannabis use.  He  has a history of pancreatitis.  He endorses epigastric pain with associated nausea, no radiation of the pain.  He denies any chest pain.  On arrival, the patient was diaphoretic and uncomfortable appearing.  He has had persistent vomiting and has not been able to take any of his home medications.  He has had similar episodes of this in the past and was last seen in the emergency department on 06/23/2021 with similar presentation.  On arrival, the patient was afebrile, not tachycardic or tachypneic, hypertensive BP 214/112, saturating at 100% on room air.  Sinus rhythm noted on cardiac telemetry.  Differential diagnosis includes aortic dissection, cannabis hyperemesis, cyclic vomiting syndrome, gastroparesis, pancreatitis, cholecystitis, small bowel obstruction, hypertensive emergency, ACS.  Patient does endorse daily  THC use and has previous presentations for cyclic vomiting and abdominal pain.  Screening laboratory work-up initiated and interpreted by myself significant for CBC without a leukocytosis, stable anemia 12.9, CMP without significant electrolyte abnormality, normal renal function, mildly elevated T. bili but otherwise normal liver and biliary function, lipase normal, UDS positive for THC, otherwise unremarkable, urinalysis with small hemoglobin, negative for UTI, initial troponin 20, repeat downtrending to 18, INR therapeutic at 2.5.  CTA chest abdomen pelvis to evaluate for dissection, pancreatitis or other acute intra-abdominal or intrathoracic abnormality was performed and results as follows: IMPRESSION:  1. No evidence for aortic dissection.  2. Mild aneurysmal dilatation of the ascending thoracic aorta  measuring 4.1 cm. Recommend annual imaging followup by CTA or MRA.  This recommendation follows 2010  ACCF/AHA/AATS/ACR/ASA/SCA/SCAI/SIR/STS/SVM Guidelines for the  Diagnosis and Management of Patients with Thoracic Aortic Disease.  Circulation. 2010; 121: [id number]. Aortic aneurysm  NOS (ICD10-I71.9)  3. Status post aortic valve repair.  4. Aortic Atherosclerosis (ICD10-I70.0) and Emphysema (ICD10-J43.9).      Electronically Signed   The patient was administered an IV fluid bolus of 1 L, IV fentanyl, IV droperidol, IV Zofran, IV Pepcid.  He was also administered Maalox orally.  He was tolerating oral intake.  Initially some concern for hypertensive emergency as the patient had elevated blood pressures and a mildly elevated serum troponin which was subsequently downtrending.  He denies any chest pain or shortness of breath with no concern for ACS at this time with flat/downtrending some prominence.  Discussed admission for observation in the setting of the patient's hypertension and mildly elevated troponin.  His home medications were reordered and is blood pressure subsequently was downtrending towards improvement.  After discussion with the patient, he feels symptomatically improved at this time.  Tolerating oral intake, no longer having abdominal pain, nausea has resolved.  Symptoms are most consistent with hypertension associated with likely cyclic vomiting syndrome and cannabis hyperemesis.  Patient does have a history of aortic valve repair.  On chart review, his troponins persistently always are mildly elevated.  Low suspicion for hypertensive emergency at this time.  Suspect likely hypertension in the setting of the patient's cannabis hyperemesis.  No other acute abnormality identified on the patient's imaging or laboratory work-up. No infectious concerns at this time. Given the patient's clinical improvement following the above interventions, ability to tolerate oral intake, feel the patient is stable for discharge with plan for continued outpatient follow-up.  Return precautions provided.    Final Clinical Impression(s) / ED Diagnoses Final diagnoses:  Continuous cannabis use  Cyclical vomiting  Epigastric pain  Hypertension, unspecified type    Rx / DC Orders ED  Discharge Orders     None         Regan Lemming, MD 08/19/21 [phone]

## 2021-08-18 NOTE — ED Triage Notes (Signed)
Pt c/o abdominal pain, nausea, and vomiting that started this morning.

## 2021-08-18 NOTE — ED Notes (Signed)
Dc instructions reviewed with patient. Patient voiced understanding. Dc with belongings.  °

## 2021-08-18 NOTE — ED Notes (Signed)
Patient transported to CT 

## 2021-08-31 ENCOUNTER — Encounter: Payer: Self-pay | Admitting: *Deleted

## 2021-09-07 ENCOUNTER — Encounter: Payer: Self-pay | Admitting: Gastroenterology

## 2021-09-07 ENCOUNTER — Ambulatory Visit: Payer: Medicaid Other | Admitting: Gastroenterology

## 2021-09-07 VITALS — BP 148/70 | HR 88 | Ht 68.0 in | Wt 129.5 lb

## 2021-09-07 DIAGNOSIS — R195 Other fecal abnormalities: Secondary | ICD-10-CM | POA: Diagnosis not present

## 2021-09-07 DIAGNOSIS — R112 Nausea with vomiting, unspecified: Secondary | ICD-10-CM | POA: Diagnosis not present

## 2021-09-07 DIAGNOSIS — R630 Anorexia: Secondary | ICD-10-CM | POA: Diagnosis not present

## 2021-09-07 DIAGNOSIS — R110 Nausea: Secondary | ICD-10-CM

## 2021-09-07 MED ORDER — COLESTIPOL HCL 1 G PO TABS: 1.0000 g | ORAL_TABLET | Freq: Two times a day (BID) | ORAL | 0 refills | Status: AC

## 2021-09-07 NOTE — Patient Instructions (Addendum)
___________________________________________  Cannabinoid Hyperemesis Syndrome  What is cannabinoid hyperemesis syndrome?  Cannabinoid hyperemesis syndrome (CHS) is a condition that leads to repeated and severe bouts of vomiting. It is rare and only occurs in daily long-term users of marijuana.  Marijuana has several active substances. These include THC and related chemicals. These substances bind to molecules found in the brain. That causes the drug "high" and other effects that users feel.  Your digestive tract also has a number of molecules that bind to [facility] and related substances. So marijuana also affects the digestive tract. For example, the drug can change the time it takes the stomach to empty. It also affects the esophageal sphincter. That's the tight band of muscle that opens and closes to let food from the esophagus into the stomach. Long-term marijuana use can change the way the affected molecules respond and lead to the symptoms of CHS.  Marijuana is the most widely used recreational drug in the [place] adults are the most frequent users. A small number of these people develop CHS. It often only happens in people who have regularly used marijuana. Often CHS affects those who use the drug at least once a day.  What causes cannabinoid hyperemesis syndrome?  Marijuana has very complex effects on the body. Experts are still trying to learn exactly how it causes CHS in some people.  In the brain, marijuana often has the opposite effect of CHS. It helps prevent nausea and vomiting. The drug is also good at stopping such symptoms in people having chemotherapy.  But in the digestive tract, marijuana seems to have the opposite effect. It actually makes you more likely to have nausea and vomiting. With the first use of marijuana, the signals from the brain may be more important. That may lead to anti-nausea effects at first. But with repeated use of marijuana, certain receptors in the brain  may stop responding to the drug in the same way. That may cause the repeated bouts of vomiting found in people with CHS.  It still isn't clear why some heavy marijuana users get the syndrome, but others don't.  What are the symptoms of cannabinoid hyperemesis syndrome?  People with CHS suffer from repeated bouts of vomiting. In between these episodes are times without any symptoms. Healthcare providers often divide these symptoms into 3 stages: the prodromal phase, the hyperemetic phase, and the recovery phase.  Prodromal phase. During this phase, the main symptoms are often early morning nausea and belly (abdominal) pain. Some people also develop a fear of vomiting. Most people keep normal eating patterns during this time. Some people use more marijuana because they think it will help stop the nausea. This phase may last for months or years.  Hyperemetic phase. Symptoms during this time may include:  Ongoing nausea  Repeated episodes of vomiting  Belly pain  Decreased food intake and weight loss  Symptoms of fluid loss (dehydration)  During this phase, vomiting is often intense and overwhelming. Many people take a lot of hot showers during the day. They find that doing so eases their nausea. (That may be because of how the hot temperature affects a part of the brain called the hypothalamus. This part of the brain effects both temperature regulation and vomiting.) People often first seek medical care during this phase.  The hyperemetic phase may continue until the person completely stops using marijuana. Then the recovery phase starts.  Recovery phase. During this time, symptoms go away. Normal eating is possible again. This phase  can last days or months. Symptoms often come back if the person tries marijuana again.  How is cannabinoid hyperemesis syndrome diagnosed?  Many health problems can cause repeated vomiting. To make a diagnosis, your healthcare provider will ask you about your  symptoms and your past health. He or she will also do a physical exam, including an exam of your belly. Often this diagnosis can be made from history alone, however.  Your healthcare provider may also need more tests to rule out other causes of the vomiting.  CHS was only recently discovered. So some healthcare providers may not know about it. As a result, they may not spot it for many years. They often confuse CHS with cyclical vomiting disorder. That is a health problem that causes similar symptoms. A specialist trained in diseases of the digestive tract (gastroenterologist) might make the diagnosis.  You may have CHS if you have all of these:  Long-term weekly and daily marijuana use  Belly pain  Severe, repeated nausea and vomiting  You feel better after taking a hot shower  There is no single test that confirms this diagnosis. Only improvement after quitting marijuana confirms the diagnosis.  How is cannabinoid hyperemesis syndrome treated?  To fully get better, you need to stop using marijuana all together. If you stop using marijuana, your symptoms should not come back.  Symptoms often ease after a day or 2 unless marijuana is used before this time.  In a small sample of people with CHS, rubbing capsaicin cream on the belly helped decrease pain and nausea. The chemicals in the cream have the same effect as a hot shower.  Quitting marijuana may lead to other health benefits, such as:  Better lung function  Improved memory and thinking skills  Better sleep  Key points about cannabinoid hyperemesis syndrome  CHS is a condition that leads to repeated and severe bouts of vomiting. It results from long-term use of marijuana.  Most people self-treat using hot showers to help reduce their symptoms.  Some people with CHS may not be diagnosed for several years. Admitting to your healthcare provider that you use marijuana daily can speed up the diagnosis.  Symptoms start to go  away within a day or 2 after stopping marijuana use.  Symptoms can come back if you use marijuana again _______________________________________________________  Brandon [name] have been scheduled for an MRI brain at [facility] on 09/18/21 Tuesday. Your appointment time is 4:00 pm. Please arrive to admitting (at main entrance of the hospital) 30 minutes prior to your appointment time for registration purposes. Please make certain not to have anything to eat or drink 6 hours prior to your test. In addition, if you have any metal in your body, have a pacemaker or defibrillator, please be sure to let your ordering physician know. This test typically takes 45 minutes to 1 hour to complete. Should you need to reschedule, please call [phone] to do so.  ________________________________________________________  We have sent the following medications to your pharmacy for you to pick up at your convenience: Colestid 1 gram twice daily x 1 month (please speak with your pharmacist BEFORE taking this medication along with your HIV medications to make sure that there are no problems between the two meds) ________________________________________________________  Ask your primary care physician if he thinks it is okay for you to add Remeron 15 mg every night to your medication regimen. ________________________________________________________  If you are age 60 or older, your body mass index should be between 23-30.  Your Body mass index is 19.69 kg/m. If this is out of the aforementioned range listed, please consider follow up with your Primary Care Provider.  If you are age 73 or younger, your body mass index should be between 19-25. Your Body mass index is 19.69 kg/m. If this is out of the aformentioned range listed, please consider follow up with your Primary Care Provider.   ________________________________________________________  The [place] [facility] providers would like to encourage you to use [facility] to  communicate with providers for non-urgent requests or questions.  Due to long hold times on the telephone, sending your provider a message by [facility] may be a faster and more efficient way to get a response.  Please allow 48 business hours for a response.  Please remember that this is for non-urgent requests.  _______________________________________________________  Due to recent changes in healthcare laws, you may see the results of your imaging and laboratory studies on MyChart before your provider has had a chance to review them.  We understand that in some cases there may be results that are confusing or concerning to you. Not all laboratory results come back in the same time frame and the provider may be waiting for multiple results in order to interpret others.  Please give [place] 48 hours in order for your provider to thoroughly review all the results before contacting the office for clarification of your results.

## 2021-09-07 NOTE — Progress Notes (Signed)
HPI :  56 year old male with a history of HIV (he reports well-controlled), history of mechanical aortic valve replacement on Coumadin, history of DVT and CVA, here for a follow-up visit for chronic nausea, poor p.o. intake.  Recall he saw me in 2019 about this issue.  He has had ongoing nausea with intermittent vomiting for more than 20-30 years.  He states he has had an upset stomach and not eating well since he was diagnosed with HIV and on antiviral regimen.  He thinks it could be related.  He has had an extensive work-up in the past including a few EGDs, CT scans, had a cholecystectomy which did not change his symptoms at all.  He has been using marijuana daily, but states symptoms started before using marijuana.  He was incarcerated for 7 years at which point he did not smoke any marijuana and his symptoms persisted.  He continues to smoke marijuana daily as it does help him eat.  He states he is nauseated mostly all day every day.  He usually does not vomit, but can have episodes once every 30 days where he will have a few days of vomiting and cannot tolerate much of p.o.  He goes to the emergency room occasionally when this happens for IV fluids.  In between episodes of this he usually does not vomit.  His appetite is chronically poor, he does not like to eat.  He at 1 point earlier in the past year or 2 had lost 10 pounds but has since regained his weight.  He states his weight is stable at 129 pounds.  He takes Zofran for the nausea once or twice a day, he thinks it does help take some of the edge off.  He has Phenergan suppositories he will use as a last resort to prevent going to the ED if it is bad enough.  He has previously been on Reglan which does not help him at all.  He has been on Compazine which has not helped.  He has been on a variety of PPIs which have not helped.  Has been on Zantac which has not helped.  He thinks has been on Remeron at 1 point but cannot recall if it helped or not,  he thinks it may have been a very long time ago.  He does take a B12 supplement daily, again smokes marijuana usually once daily.  He is not taking any NSAIDs, denies any blood in his stools.  Denies any abdominal pains.  He does have some postprandial loose stools after he eats since cholecystectomy.  He states he is maintained on HIV regimen as outlined below with an undetectable viral load.  He has had HIV since the 1990s.  He has had 4 CT scans this year alone for weight loss, nausea vomiting, discomfort.  None of which have shown a clear cause for her symptoms.  He has had 2 scans which suggested some right colon inflammation his last CT scan this month it looked normal.  His colonoscopy did not show any concerning pathology back in December 2019.   Prior workup:  EGD 07/24/2012 - small gastric AVM, otherwise normal Barium swallow -12/01/14 - normal esophogram   EGD 03/09/2018: - The exam of the esophagus was otherwise normal. - The entire examined stomach was normal. Biopsies were taken with a cold forceps for Helicobacter pylori testing. - The duodenal bulb and second portion of the duodenum were normal.   Colonoscopy 03/09/2018: Hemorrhoids were found on perianal  exam. - A diminutive polyp was found in the cecum. The polyp was sessile. The polyp was removed with a cold biopsy forceps. Resection and retrieval were complete. - A 3 mm polyp was found in the transverse colon. The polyp was sessile. The polyp was removed with a cold biopsy forceps. Resection and retrieval were complete. - A diminutive polyp was found in the rectum. The polyp was sessile. The polyp was removed with a cold biopsy forceps. Resection and retrieval were complete. - A few small-mouthed diverticula were found in the ascending colon. - Internal hemorrhoids were found during retroflexion. - The exam was otherwise without abnormality.   1. Surgical [P], gastric antrum and gastric body - GASTRIC ANTRAL AND  OXYNTIC MUCOSA WITH NONSPECIFIC REACTIVE GASTROPATHY - WARTHIN STARRY STAIN IS NEGATIVE FOR HELICOBACTER PYLORI 2. Surgical [P], rectum, transverse and cecum, polyp (4) - TUBULAR ADENOMA WITHOUT HIGH-GRADE DYSPLASIA OR MALIGNANCY - HYPERPLASTIC POLYP(S)   CT C/A/P 08/18/21: IMPRESSION: 1. No evidence for aortic dissection. 2. 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 Recommend annual imaging followup by CTA or MRA. This recommendation follows 2010 ACCF/AHA/AATS/ACR/ASA/SCA/SCAI/SIR/STS/SVM Guidelines for the Diagnosis and Management of Patients with Thoracic Aortic Disease. Circulation. 2010; 121: I627-O350. Aortic aneurysm NOS (ICD10-I71.9) 3. Status post aortic valve repair. 4. Aortic Atherosclerosis (ICD10-I70.0) and Emphysema (ICD10-J43.9).  CT abdomen / pelvis 06/23/21: IMPRESSION: 1. Fluid-filled tubular structure measuring 1.4 cm in diameter is identified within the right lower quadrant of the abdomen. This is favored to represent unopacified fluid-filled loop of small bowel. However, the appendix is not confidently identified. If there is a clinical concern for acute appendicitis consider repeat imaging to allow for enteric contrast opacification of the distal small bowel loops and proximal colon. 2. Mild wall thickening involving the ascending colon is again noted. Correlate for symptoms of ascending colitis. 3. Small volume of ascites. 4. Aortic Atherosclerosis (ICD10-I70.0).   CT abdomen / pelvis 06/04/21: IMPRESSION: 1.  No urinary tract calculi or hydronephrosis. 2. Otherwise, low sensitivity exam secondary to lack of oral/IV contrast, paucity of abdominal fat. 3. Possible ascending colonic wall thickening and pericolonic edema. Correlate with symptoms suggest ascending colitis, likely infectious. Also correlate with colon cancer screening history, to exclude underlying mass. Consider oral and IV contrast enhanced CT if  possible. 4.  Aortic Atherosclerosis (ICD10-I70.0).  CT abdomen / pelvis 04/04/21: IMPRESSION: 1. No acute findings in the abdomen or pelvis. 2.  Aortic Atherosclerosis (ICD10-I70.0).      Past Medical History:  Diagnosis Date   Abdominal pain    Anemia    Aortic atherosclerosis (HCC)    Arthritis    Back pain 02/13/2016   Constipation    Depression    Diarrhea    Diverticulosis    Family history of CVA 06/27/2021   Foot lesion 12/19/2014   Gallstones    Gastric AVM    GERD (gastroesophageal reflux disease)    GI bleed    HIV (human immunodeficiency virus infection) ([place])    Hypertension    IBS (irritable bowel syndrome)    Infectious colitis    Internal hemorrhoids    Interstitial cystitis    Lymphopenia 05/31/2020   Mechanical heart valve present    Nausea & vomiting    Osteopenia 06/02/2018   Pancreatitis    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   Stroke ([place])    Weight loss, unintentional      Past Surgical History:  Procedure Laterality Date   AORTIC VALVE REPLACEMENT  CARDIAC SURGERY     CHOLECYSTECTOMY  02/12/2012   Procedure: LAPAROSCOPIC CHOLECYSTECTOMY;  Surgeon: Stark Klein, MD;  Location: [facility];  Service: General;  Laterality: N/A;   COLONOSCOPY WITH ESOPHAGOGASTRODUODENOSCOPY (EGD)     with polypectomy   ESOPHAGOGASTRODUODENOSCOPY N/A 07/24/2012   Procedure: ESOPHAGOGASTRODUODENOSCOPY (EGD);  Surgeon: Beryle Beams, MD;  Location[facility] ENDOSCOPY;  Service: Endoscopy;  Laterality: N/A;   KNEE SURGERY     MULTIPLE EXTRACTIONS WITH ALVEOLOPLASTY N/A 03/13/2018   Procedure: MULTIPLE EXTRACTION;  Surgeon: Diona Browner, DDS;  Location: [facility];  Service: Oral Surgery;  Laterality: N/A;   Family History  Problem Relation Age of Onset   Hypertension Father    Prostate cancer Father    Stomach cancer Father    Hypertension Sister    Diabetes Maternal Aunt    Cancer - Other Cousin    Parkinson's disease Paternal Aunt    Social  History   Tobacco Use   Smoking status: Former    Packs/day: 0.10    Years: 20.00    Total pack years: 2.00    Types: Cigars, Cigarettes    Quit date: 08/02/2017    Years since quitting: 4.1   Smokeless tobacco: Never  Vaping Use   Vaping Use: Some days   Substances: CBD   Devices: CBD vaping  Substance Use Topics   Alcohol use: Yes    Alcohol/week: 0.0 standard drinks of alcohol    Comment: rarely    Drug use: Yes    Frequency: 7.0 times per week    Types: Marijuana   Current Outpatient Medications  Medication Sig Dispense Refill   ARIPiprazole (ABILIFY) 5 MG tablet Take 1 tablet by mouth at bedtime.     colestipol (COLESTID) 1 g tablet Take 1 tablet (1 g total) by mouth 2 (two) times daily. 30 tablet 0   dolutegravir (TIVICAY) 50 MG tablet Take 1 tablet (50 mg total) by mouth 2 (two) times daily. 60 tablet 11   emtricitabine-tenofovir AF (DESCOVY) 200-25 MG tablet Take 1 tablet by mouth daily. 30 tablet 11   EPINEPHrine (EPI-PEN) 0.3 mg/0.3 mL DEVI Inject 0.3 mLs (0.3 mg total) into the muscle once. 1 Device 1   escitalopram (LEXAPRO) 20 MG tablet TAKE 1 TABLET BY MOUTH   DAILY (Patient taking differently: Take 20 mg by mouth daily.) 30 tablet 1   fostemsavir tromethamine (RUKOBIA) 600 MG TB12 ER tablet Take 1 tablet by mouth every 12 (twelve) hours. 60 tablet 11   furosemide (LASIX) 20 MG tablet Please take one tablet 20 mg by mouth daily as needed for shortness of breath.     LORazepam (ATIVAN) 1 MG tablet Take 1 mg by mouth at bedtime as needed for anxiety or sleep.     losartan (COZAAR) 25 MG tablet Take 25 mg by mouth daily.     metoprolol succinate (TOPROL-XL) 25 MG 24 hr tablet Take 25 mg by mouth daily.     ondansetron (ZOFRAN) 4 MG tablet Take 1 tablet (4 mg total) by mouth every 6 (six) hours. 12 tablet 0   ondansetron (ZOFRAN-ODT) 8 MG disintegrating tablet Take 1 tablet (8 mg total) by mouth every 8 (eight) hours as needed for nausea or vomiting. 30 tablet 5   Opium  10 MG/ML (1%) TINC Take 0.6 mLs (6 mg total) by mouth every 6 (six) hours. 72 mL 0   Pitavastatin Calcium 1 MG TABS Take 1 tablet (1 mg total) by mouth daily. 30 tablet 11  polyethylene glycol powder (GLYCOLAX/MIRALAX) 17 GM/SCOOP powder Take 17 g by mouth daily.     PROMETHEGAN 25 MG suppository Place 25 mg rectally every 6 (six) hours as needed for nausea or vomiting.      traZODone (DESYREL) 50 MG tablet Take 50 mg by mouth at bedtime.      valACYclovir (VALTREX) 1000 MG tablet Take 1 tablet (1,000 mg total) by mouth daily. 30 tablet 11   warfarin (COUMADIN) 1 MG tablet Take 1 mg by mouth every Friday. Take as directed with '5mg'$  tablet per [facility] on Fridays.     warfarin (COUMADIN) 4 MG tablet Take by mouth See admin instructions. Takes '5MG'$  and '4MG'$  together ('9MG'$  total) on Sundays and Tuesdays     warfarin (COUMADIN) 5 MG tablet Take 5 mg by mouth See admin instructions. Takes 5 MG on Monday, Wednesday, Thursday, Friday and Saturday, then 9 MG on Sunday and Tuesday     zidovudine (RETROVIR) 300 MG tablet Take 1 tablet (300 mg total) by mouth 2 (two) times daily. 60 tablet 11   No current facility-administered medications for this visit.   Facility-Administered Medications Ordered in Other Visits  Medication Dose Route Frequency Provider Last Rate Last Admin   0.9 %  sodium chloride infusion   Intravenous Once Campbell Riches, MD       Allergies  Allergen Reactions   Bactrim [Sulfamethoxazole-Trimethoprim]    Bee Venom Anaphylaxis   Sulfa Antibiotics Anaphylaxis   Truvada [Emtricitabine-Tenofovir Df] Anaphylaxis and Rash    Takes plain tenofovir at home   Lidoderm [Lidocaine] Other (See Comments)    Reaction unknown   Raltegravir     resistance   Ceftriaxone Rash   Sulfamethoxazole Itching, Other (See Comments) and Rash    Other reaction(s): Hypotension (ALLERGY/intolerance)     Review of Systems: All systems reviewed and negative except where noted in HPI.    CT  Angio Chest/Abd/Pel for Dissection W and/or Wo Contrast  Result Date: 08/18/2021 CLINICAL DATA:  Acute aortic syndrome suspected. Complains of abdominal pain, nausea and vomiting. EXAM: CT ANGIOGRAPHY CHEST, ABDOMEN AND PELVIS TECHNIQUE: Non-contrast CT of the chest was initially obtained. Multidetector CT imaging through the chest, abdomen and pelvis was performed using the standard protocol during bolus administration of intravenous contrast. Multiplanar reconstructed images and MIPs were obtained and reviewed to evaluate the vascular anatomy. RADIATION DOSE REDUCTION: This exam was performed according to the departmental dose-optimization program which includes automated exposure control, adjustment of the mA and/or kV according to patient size and/or use of iterative reconstruction technique. CONTRAST:  126m OMNIPAQUE IOHEXOL 350 MG/ML SOLN COMPARISON:  CT AP 06/23/2021 FINDINGS: CTA CHEST FINDINGS Cardiovascular: Preferential opacification of the thoracic aorta. No evidence for thoracic aortic dissection. Mild aneurysmal dilatation of the ascending thoracic aorta measures 4.1 cm, image 79/8. Aortic atherosclerotic calcifications noted. Status post aortic valve repair heart size is upper limits of normal. No pericardial effusion. Mediastinum/Nodes: No enlarged axillary, supraclavicular, mediastinal or hilar lymph nodes. Thyroid gland, trachea and esophagus demonstrate no significant findings. Lungs/Pleura: No pleural effusion, airspace consolidation, atelectasis, or pneumothorax. Centrilobular emphysema. There is interlobular septal thickening within the lung bases concerning for mild interstitial edema. No suspicious pulmonary nodule or mass. Musculoskeletal: No chest wall abnormality. No acute or significant osseous findings. Review of the MIP images confirms the above findings. CTA ABDOMEN AND PELVIS FINDINGS VASCULAR Aorta: Normal caliber aorta without aneurysm, dissection, vasculitis or significant  stenosis. Aortic atherosclerotic calcifications noted. There is extensive calcified and noncalcified  plaque at the left common iliac artery with approximately 50% stenosis. Celiac: Patent without evidence of aneurysm, dissection, vasculitis or significant stenosis. SMA: Patent without evidence of aneurysm, dissection, vasculitis or significant stenosis. Renals: Both renal arteries are patent without evidence of aneurysm, dissection, vasculitis, fibromuscular dysplasia or significant stenosis. IMA: Patent without evidence of aneurysm, dissection, vasculitis or significant stenosis. Inflow: Patent without evidence of aneurysm, dissection, vasculitis or significant stenosis. Veins: No obvious venous abnormality within the limitations of this arterial phase study. Review of the MIP images confirms the above findings. NON-VASCULAR Hepatobiliary: No focal liver abnormality is seen. Status post cholecystectomy. No biliary dilatation. Pancreas: Unremarkable. No pancreatic ductal dilatation or surrounding inflammatory changes. Spleen: Normal in size without focal abnormality. Adrenals/Urinary Tract: Normal adrenal glands. Benign Bosniak class 1 cyst arising off the lower pole of right kidney measures 1.1 cm. No follow-up recommended. Urinary bladder is unremarkable. Stomach/Bowel: No pathologic dilatation of the large or small bowel loops. Evaluation of bowel pathology is limited however due to lack of enteric contrast material and arterial phase of contrast opacification. A moderate stool burden is identified within the sigmoid colon and rectum. Lymphatic: No signs of abdominopelvic adenopathy. Reproductive: Prostate is unremarkable. Other: No free fluid or fluid collections. No signs of pneumoperitoneum. Musculoskeletal: No acute or significant osseous findings. Review of the MIP images confirms the above findings. IMPRESSION: 1. No evidence for aortic dissection. 2. Mild aneurysmal dilatation of the ascending thoracic  aorta measuring 4.1 cm. Recommend annual imaging followup by CTA or MRA. This recommendation follows 2010 ACCF/AHA/AATS/ACR/ASA/SCA/SCAI/SIR/STS/SVM Guidelines for the Diagnosis and Management of Patients with Thoracic Aortic Disease. Circulation. 2010; 121: Q947-M546. Aortic aneurysm NOS (ICD10-I71.9) 3. Status post aortic valve repair. 4. Aortic Atherosclerosis (ICD10-I70.0) and Emphysema (ICD10-J43.9). Electronically Signed   By: Kerby Moors M.D.   On: 08/18/2021 10:30    Lab Results  Component Value Date   WBC 6.2 08/18/2021   HGB 12.9 (L) 08/18/2021   HCT 37.5 (L) 08/18/2021   MCV 112.6 (H) 08/18/2021   PLT 217 08/18/2021    Lab Results  Component Value Date   CREATININE 1.11 08/18/2021   BUN 13 08/18/2021   NA 139 08/18/2021   K 3.8 08/18/2021   CL 103 08/18/2021   CO2 27 08/18/2021    Lab Results  Component Value Date   ALT 25 08/18/2021   AST 33 08/18/2021   ALKPHOS 87 08/18/2021   BILITOT 1.3 (H) 08/18/2021     Physical Exam: BP (!) 148/70   Pulse 88   Ht '5\' 8"'$  (1.727 m)   Wt 129 lb 8 oz (58.7 kg)   SpO2 99%   BMI 19.69 kg/m  Constitutional: Pleasant,male in no acute distress. HEENT: Normocephalic and atraumatic. Conjunctivae are normal. No scleral icterus. Neck supple.  Cardiovascular: Normal rate, regular rhythm.  Pulmonary/chest: Effort normal and breath sounds normal.  Abdominal: Soft, nondistended, nontender.  There are no masses palpable.  Extremities: no edema Lymphadenopathy: No cervical adenopathy noted. Neurological: Alert and oriented to person place and time. Skin: Skin is warm and dry. No rashes noted. Psychiatric: Normal mood and affect. Behavior is normal.   ASSESSMENT AND PLAN: 56 year old male here to reestablish care for the following:  Chronic nausea Intermittent vomiting Poor appetite Loose stools  As above, more than 20 years worth of similar symptoms with multiple imaging studies, endoscopies without clear cause.  He has  been on a variety of regimens which have not provided much help, Zofran perhaps slightly.  He has  been off marijuana for periods of time which does not seem to help his symptoms at all.  We have discussed that frequent marijuana use can lead to cannabinoid hyperemesis syndrome, he feels strongly that this actually provides more benefit and again stopping this has not provided any relief.  It is quite possible that his HIV regimen is leading to his chronic nausea, we discussed this for a bit and he feels time course lines up.  Problem is he cannot stop these medications, has been on a variety of regimens in the past, due to resistance he needs to stay on this regimen.  We discussed options.  The only test I can see that he has not had for chronic nausea vomiting would be an MRI of his brain.  He has occasional headaches but not frequently.  That being said he wants to pursue this to make sure okay.  I otherwise think putting him on Remeron might be reasonable to help increase his appetite and perhaps help his nausea.  It could interact with his trazodone and SSRI, I recommend he talk with his primary care about this option. We discussed his failure of all other options over time.  If Remeron does not help we may consider getting him aprepitant as another antiemetic, however this is quite expensive and not routinely covered by insurance as outpatient.  We will await his course, he will contact me if symptoms persist despite Remeron after talking with his primary care.  For his loose stools that seem postprandial we can try him on some Colestid 1 g twice daily in light of his cholecystectomy history.  He will review with pharmacy to make sure it does not bind any of his HIV medicines, and timing of when he should take this.  We discussed his prior CT scans, 1 showed colonic thickening, the rest did not.  He declines colonoscopy at this time.  He can follow-up with me in a few months for reassessment.  Plan: -  MRI brain - counseled on Marijuana use risk for CHS - Colestid 1gm BID for 1 month trial - talk with pharmacist about taking that andHIV meds - consider starting Remeron '15mg'$  q HS - patient will discuss with his PCP to see if safe to do so while on trazodone and SSRI - consider Aprepitant consider if he continues to do poorly, unclear if would be covered by insurance  Jolly Mango, MD [facility]  CC: Thalia Party

## 2021-09-09 ENCOUNTER — Encounter: Payer: Self-pay | Admitting: Gastroenterology

## 2021-09-18 ENCOUNTER — Ambulatory Visit (HOSPITAL_COMMUNITY)
Admission: RE | Admit: 2021-09-18 | Discharge: 2021-09-18 | Disposition: A | Discharge status: Home / Self Care | Payer: 59 | Source: Ambulatory Visit | Attending: Gastroenterology | Admitting: Gastroenterology

## 2021-09-18 DIAGNOSIS — R112 Nausea with vomiting, unspecified: Secondary | ICD-10-CM | POA: Diagnosis present

## 2021-09-18 DIAGNOSIS — R110 Nausea: Secondary | ICD-10-CM | POA: Diagnosis not present

## 2021-09-18 MED ORDER — GADOBUTROL 1 MMOL/ML IV SOLN
6.0000 mL | Freq: Once | INTRAVENOUS | Status: AC | PRN
  Administered 2021-09-18: 6 mL via INTRAVENOUS

## 2021-09-20 ENCOUNTER — Telehealth: Payer: Self-pay

## 2021-09-20 ENCOUNTER — Telehealth: Payer: Self-pay | Admitting: Gastroenterology

## 2021-09-20 DIAGNOSIS — Z823 Family history of stroke: Secondary | ICD-10-CM

## 2021-09-20 DIAGNOSIS — R112 Nausea with vomiting, unspecified: Secondary | ICD-10-CM

## 2021-09-20 DIAGNOSIS — R110 Nausea: Secondary | ICD-10-CM

## 2021-09-20 NOTE — Telephone Encounter (Signed)
Fascinating Is he having any tinnitus?   Plan: -Lets start with ENT consultation. -Also pl send report to PCP  RG

## 2021-09-20 NOTE — Telephone Encounter (Signed)
Please see alternate telephone encounter.

## 2021-09-20 NOTE — Telephone Encounter (Signed)
Lm on vm for patient to return call.  Referral, records, demographic, and insurance information faxed to [facility] ([place]: [phone], F: [phone]).  MRI report faxed to Thalia Party, MD (P: [phone], F: [phone])

## 2021-09-20 NOTE — Telephone Encounter (Signed)
Received a call report from [place] at [facility] with call report for MRI brain.  Dr. Lyndel Safe please review MRI report as DOD AM of 09/20/21, the radiologist was concerned about impression #1.  Dr. Doyne Keel patient with a hx of chronic nausea and vomiting   IMPRESSION: 1. 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 2. Chronic infarct within the medial and posteroinferior left temporal lobe (PCA vascular territory). Notably, this infarct involves portions of the left hippocampus. 3. Background mild chronic small vessel ischemic changes within the cerebral white matter. 4. Multiple small chronic infarcts within the bilateral cerebellar hemispheres.

## 2021-09-20 NOTE — Telephone Encounter (Signed)
Brandon [name], Brandon [name] [phone]  [name], [name] 57 minutes ago (1:43 PM)   PT is returning call. Please reach out to advise   Incoming call    Returned call to patient. We reviewed Dr. Steve Rattler recommendations regarding inner ear nodule. Pt is aware that we placed referral to [facility] in [place] and we faxed a copy of his MRI report to his PCP for their records. Pt denies any ear ringing, but does report occasional left ear pain. Pt knows to expect a call from ENT to set up his appt. Pt verbalized understanding and had no concerns at the end of the call.

## 2021-09-21 NOTE — Telephone Encounter (Signed)
Thanks [employer]. I am just seeing this, was out of town on vacation thanks for covering.  [name] I agree with plan. He has a known history of CVAs, not sure if he is seeing Neurology but would suggest he also follow up with them if it has been a long term, as well as his PCP, and will see what ENT says. Thanks

## 2021-09-22 ENCOUNTER — Other Ambulatory Visit: Payer: Self-pay

## 2021-09-22 ENCOUNTER — Emergency Department (HOSPITAL_BASED_OUTPATIENT_CLINIC_OR_DEPARTMENT_OTHER): Payer: 59

## 2021-09-22 ENCOUNTER — Encounter (HOSPITAL_BASED_OUTPATIENT_CLINIC_OR_DEPARTMENT_OTHER): Payer: Self-pay | Admitting: Emergency Medicine

## 2021-09-22 ENCOUNTER — Emergency Department (HOSPITAL_BASED_OUTPATIENT_CLINIC_OR_DEPARTMENT_OTHER)
Admission: EM | Admit: 2021-09-22 | Discharge: 2021-09-22 | Disposition: A | Discharge status: Home / Self Care | Payer: 59 | Attending: Emergency Medicine | Admitting: Emergency Medicine

## 2021-09-22 DIAGNOSIS — R1084 Generalized abdominal pain: Secondary | ICD-10-CM | POA: Insufficient documentation

## 2021-09-22 DIAGNOSIS — Z21 Asymptomatic human immunodeficiency virus [HIV] infection status: Secondary | ICD-10-CM | POA: Insufficient documentation

## 2021-09-22 DIAGNOSIS — R112 Nausea with vomiting, unspecified: Secondary | ICD-10-CM | POA: Diagnosis not present

## 2021-09-22 DIAGNOSIS — Z79899 Other long term (current) drug therapy: Secondary | ICD-10-CM | POA: Insufficient documentation

## 2021-09-22 DIAGNOSIS — Z7902 Long term (current) use of antithrombotics/antiplatelets: Secondary | ICD-10-CM | POA: Insufficient documentation

## 2021-09-22 DIAGNOSIS — I10 Essential (primary) hypertension: Secondary | ICD-10-CM | POA: Diagnosis not present

## 2021-09-22 DIAGNOSIS — R791 Abnormal coagulation profile: Secondary | ICD-10-CM | POA: Insufficient documentation

## 2021-09-22 LAB — CBC
HCT: 37.9 % — ABNORMAL LOW (ref 39.0–52.0)
Hemoglobin: 13.2 g/dL (ref 13.0–17.0)
MCH: 38.6 pg — ABNORMAL HIGH (ref 26.0–34.0)
MCHC: 34.8 g/dL (ref 30.0–36.0)
MCV: 110.8 fL — ABNORMAL HIGH (ref 80.0–100.0)
Platelets: 215 10*3/uL (ref 150–400)
RBC: 3.42 MIL/uL — ABNORMAL LOW (ref 4.22–5.81)
RDW: 14.6 % (ref 11.5–15.5)
WBC: 4.6 10*3/uL (ref 4.0–10.5)
nRBC: 0 % (ref 0.0–0.2)

## 2021-09-22 LAB — COMPREHENSIVE METABOLIC PANEL
ALT: 25 U/L (ref 0–44)
AST: 34 U/L (ref 15–41)
Albumin: 4.9 g/dL (ref 3.5–5.0)
Alkaline Phosphatase: 77 U/L (ref 38–126)
Anion gap: 9 (ref 5–15)
BUN: 12 mg/dL (ref 6–20)
CO2: 26 mmol/L (ref 22–32)
Calcium: 10.2 mg/dL (ref 8.9–10.3)
Chloride: 105 mmol/L (ref 98–111)
Creatinine, Ser: 1.27 mg/dL — ABNORMAL HIGH (ref 0.61–1.24)
GFR, Estimated: >60 mL/min (ref >60)
Glucose, Bld: 118 mg/dL — ABNORMAL HIGH (ref 70–99)
Potassium: 3.9 mmol/L (ref 3.5–5.1)
Sodium: 140 mmol/L (ref 135–145)
Total Bilirubin: 1.7 mg/dL — ABNORMAL HIGH (ref 0.3–1.2)
Total Protein: 7.4 g/dL (ref 6.5–8.1)

## 2021-09-22 LAB — URINALYSIS, ROUTINE W REFLEX MICROSCOPIC
Bilirubin Urine: NEGATIVE
Glucose, UA: NEGATIVE mg/dL
Ketones, ur: NEGATIVE mg/dL
Leukocytes,Ua: NEGATIVE
Nitrite: NEGATIVE
Protein, ur: 30 mg/dL — AB
Specific Gravity, Urine: 1.012 (ref 1.005–1.030)
pH: 7 (ref 5.0–8.0)

## 2021-09-22 LAB — PROTIME-INR
INR: 2.4 — ABNORMAL HIGH (ref 0.8–1.2)
Prothrombin Time: 26.2 seconds — ABNORMAL HIGH (ref 11.4–15.2)

## 2021-09-22 LAB — LIPASE, BLOOD: Lipase: 26 U/L (ref 11–51)

## 2021-09-22 MED ORDER — LACTATED RINGERS IV BOLUS
1000.0000 mL | Freq: Once | INTRAVENOUS | Status: AC
  Administered 2021-09-22: 1000 mL via INTRAVENOUS

## 2021-09-22 MED ORDER — DIPHENHYDRAMINE HCL 50 MG/ML IJ SOLN
25.0000 mg | Freq: Once | INTRAMUSCULAR | Status: AC
  Administered 2021-09-22: 25 mg via INTRAVENOUS
  Filled 2021-09-22: qty 1

## 2021-09-22 MED ORDER — LABETALOL HCL 5 MG/ML IV SOLN
10.0000 mg | Freq: Once | INTRAVENOUS | Status: AC
  Administered 2021-09-22: 10 mg via INTRAVENOUS
  Filled 2021-09-22: qty 4

## 2021-09-22 MED ORDER — FENTANYL CITRATE PF 50 MCG/ML IJ SOSY
50.0000 ug | PREFILLED_SYRINGE | Freq: Once | INTRAMUSCULAR | Status: AC
  Administered 2021-09-22: 50 ug via INTRAVENOUS
  Filled 2021-09-22: qty 1

## 2021-09-22 MED ORDER — PROMETHEGAN 25 MG RE SUPP
25.0000 mg | Freq: Four times a day (QID) | RECTAL | 0 refills | Status: DC | PRN
Start: 2021-09-22 — End: 2022-04-01

## 2021-09-22 MED ORDER — PANTOPRAZOLE SODIUM 40 MG IV SOLR
40.0000 mg | Freq: Once | INTRAVENOUS | Status: AC
  Administered 2021-09-22: 40 mg via INTRAVENOUS
  Filled 2021-09-22: qty 10

## 2021-09-22 MED ORDER — HYDRALAZINE HCL 20 MG/ML IJ SOLN
5.0000 mg | Freq: Once | INTRAMUSCULAR | Status: AC
Start: 2021-09-22 — End: 2021-09-22
  Administered 2021-09-22: 5 mg via INTRAVENOUS
  Filled 2021-09-22: qty 1

## 2021-09-22 MED ORDER — METOCLOPRAMIDE HCL 5 MG/ML IJ SOLN
10.0000 mg | Freq: Once | INTRAMUSCULAR | Status: AC
  Administered 2021-09-22: 10 mg via INTRAVENOUS
  Filled 2021-09-22: qty 2

## 2021-09-22 MED ORDER — IOHEXOL 350 MG/ML SOLN
100.0000 mL | Freq: Once | INTRAVENOUS | Status: AC | PRN
Start: 2021-09-22 — End: 2021-09-22
  Administered 2021-09-22: 85 mL via INTRAVENOUS

## 2021-09-22 MED ORDER — HALOPERIDOL LACTATE 5 MG/ML IJ SOLN
4.0000 mg | Freq: Once | INTRAMUSCULAR | Status: AC
  Administered 2021-09-22: 4 mg via INTRAVENOUS
  Filled 2021-09-22: qty 1

## 2021-09-22 NOTE — ED Triage Notes (Signed)
Pt returns with n/v/ abdominal pain in the center started few hours ago. Pt smoked marijuana yesterday and endorses daily use.

## 2021-09-22 NOTE — ED Provider Notes (Signed)
[facility] EMERGENCY DEPT Provider Note   CSN: [id number] Arrival date & time: 09/22/21  0855     History  Chief Complaint  Patient presents with   Abdominal Pain    Brandon [name] is a 56 y.o. male.  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  Reports that he woke up at about 8 this morning with symptoms of nausea, vomiting and abdominal pain.  Similar to symptoms he is had in the past.  Reports a sharp pain to the epigastrium of his abdomen.  Reports he has had approximately 5-10 episodes of nausea and vomiting today.  Denies any hematemesis.  Denies black or bloody stool, diarrhea, or constipation.  Denies chest pain, shortness of breath, cough, fever, dysuria.  Denies alcohol use or smoking.  Reports that he did use marijuana yesterday.  He typically takes his blood pressure medications at night. 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  Home Medications Prior to Admission medications   Medication Sig Start Date End Date Taking? Authorizing Provider  ARIPiprazole (ABILIFY) 5 MG tablet Take 1 tablet by mouth at bedtime. 04/21/20   [provider]  colestipol (COLESTID) 1 g tablet Take 1 tablet (1 g total) by mouth 2 (two) times daily. 09/07/21   Armbruster, Carlota Raspberry, MD  dolutegravir (TIVICAY) 50 MG tablet Take 1 tablet (50 mg total) by mouth 2 (two) times daily. 06/27/21   Truman Hayward, MD  emtricitabine-tenofovir AF (DESCOVY) 200-25 MG tablet Take 1  tablet by mouth daily. 06/27/21   Truman Hayward, MD  EPINEPHrine (EPI-PEN) 0.3 mg/0.3 mL DEVI Inject 0.3 mLs (0.3 mg total) into the muscle once. 08/20/12   Truman Hayward, MD  escitalopram (LEXAPRO) 20 MG tablet TAKE 1 TABLET BY MOUTH   DAILY Patient taking differently: Take 20 mg by mouth daily. 02/14/16   Truman Hayward, MD  fostemsavir tromethamine (RUKOBIA) 600 MG TB12 ER tablet Take 1 tablet by mouth every 12 (twelve) hours. 06/27/21   Truman Hayward, MD  furosemide (LASIX) 20 MG tablet Please take one tablet 20 mg by mouth daily as needed for shortness of breath. 01/04/20   [provider]  LORazepam (ATIVAN) 1 MG tablet Take 1 mg by mouth at bedtime as needed for anxiety or sleep.    [provider]  losartan (COZAAR) 25 MG tablet Take 25 mg by mouth daily. 09/24/19   [provider]  metoprolol succinate (TOPROL-XL) 25 MG 24 hr tablet Take 25 mg by mouth daily. 11/18/19   [provider]  ondansetron (ZOFRAN) 4 MG tablet Take 1 tablet (4 mg total) by mouth every 6 (six) hours. 04/04/21   Couture, Cortni S, PA-C  ondansetron (ZOFRAN-ODT) 8 MG disintegrating tablet Take 1 tablet (8 mg total) by mouth every 8 (eight) hours as needed for nausea or vomiting. 07/13/20   Tommy Medal, Lavell Islam, MD  Opium 10 MG/ML (1%) TINC Take 0.6 mLs (6 mg total) by mouth every 6 (six) hours. 07/25/16   Tommy Medal, [place],  MD  Pitavastatin Calcium 1 MG TABS Take 1 tablet (1 mg total) by mouth daily. 06/27/21   Truman Hayward, MD  polyethylene glycol powder [facility]) 17 GM/SCOOP powder Take 17 g by mouth daily. 06/19/21   [provider]  PROMETHEGAN 25 MG suppository Place 1 suppository (25 mg total) rectally every 6 (six) hours as needed for nausea or vomiting. 09/22/21   Gareth Morgan, MD  traZODone (DESYREL) 50 MG tablet Take 50 mg by mouth at bedtime.  12/11/18   [provider]  valACYclovir (VALTREX) 1000 MG tablet Take 1  tablet (1,000 mg total) by mouth daily. 12/15/20   Truman Hayward, MD  warfarin (COUMADIN) 1 MG tablet Take 1 mg by mouth every Friday. Take as directed with '5mg'$  tablet per [facility] on Fridays. 03/24/14   [provider]  warfarin (COUMADIN) 4 MG tablet Take by mouth See admin instructions. Takes '5MG'$  and '4MG'$  together ('9MG'$  total) on Sundays and Tuesdays 10/22/19   [provider]  warfarin (COUMADIN) 5 MG tablet Take 5 mg by mouth See admin instructions. Takes 5 MG on Monday, Wednesday, Thursday, Friday and Saturday, then 9 MG on Sunday and Tuesday    [provider]  zidovudine (RETROVIR) 300 MG tablet Take 1 tablet (300 mg total) by mouth 2 (two) times daily. 06/27/21   Truman Hayward, MD      Allergies    Bactrim [sulfamethoxazole-trimethoprim], Bee venom, Sulfa antibiotics, Truvada [emtricitabine-tenofovir df], Lidoderm [lidocaine], Raltegravir, Ceftriaxone, and Sulfamethoxazole    Review of Systems   Review of Systems  Physical Exam Updated Vital Signs BP (!) 193/112   Pulse (!) 102   Temp 98.2 F (36.8 C) (Oral)   Resp 18   SpO2 99%  Physical Exam Vitals and nursing note reviewed.  Constitutional:      General: He is not in acute distress.    Appearance: He is well-developed. He is not diaphoretic.  HENT:     Head: Normocephalic and atraumatic.  Eyes:     Conjunctiva/sclera: Conjunctivae normal.  Cardiovascular:     Rate and Rhythm: Normal rate and regular rhythm.     Heart sounds: Normal heart sounds. No murmur heard.    No friction rub. No gallop.  Pulmonary:     Effort: Pulmonary effort is normal. No respiratory distress.     Breath sounds: Normal breath sounds. No wheezing or rales.  Abdominal:     General: There is no distension.     Palpations: Abdomen is soft.     Tenderness: There is abdominal tenderness (diffuse). There is no guarding.  Musculoskeletal:     Cervical back: Normal range of motion.  Skin:    General:  Skin is warm and dry.  Neurological:     Mental Status: He is alert and oriented to person, place, and time.     ED Results / Procedures / Treatments   Labs (all labs ordered are listed, but only abnormal results are displayed) Labs Reviewed  COMPREHENSIVE METABOLIC PANEL - Abnormal; Notable for the following components:      Result Value   Glucose, Bld 118 (*)    Creatinine, Ser 1.27 (*)    Total Bilirubin 1.7 (*)    All other components within normal limits  CBC - Abnormal; Notable for the following components:   RBC 3.42 (*)    HCT 37.9 (*)    MCV 110.8 (*)    MCH 38.6 (*)    All  other components within normal limits  URINALYSIS, ROUTINE W REFLEX MICROSCOPIC - Abnormal; Notable for the following components:   Hgb urine dipstick LARGE (*)    Protein, ur 30 (*)    All other components within normal limits  PROTIME-INR - Abnormal; Notable for the following components:   Prothrombin Time 26.2 (*)    INR 2.4 (*)    All other components within normal limits  LIPASE, BLOOD    EKG EKG Interpretation  Date/Time:  Saturday September 22 2021 09:45:29 EDT Ventricular Rate:  75 PR Interval:  151 QRS Duration: 107 QT Interval:  397 QTC Calculation: 444 R Axis:   -38 Text Interpretation: Sinus rhythm Probable left atrial enlargement RSR' in V1 or V2, probably normal variant LVH with secondary repolarization abnormality Confirmed by Gareth Morgan [phone]) on 09/22/2021 10:09:09 AM  Radiology CT Angio Chest/Abd/Pel for Dissection W and/or Wo Contrast  Result Date: 09/22/2021 CLINICAL DATA:  Chest pain or back pain, aortic dissection suspected. Nausea, vomiting and abdominal pain. EXAM: CT ANGIOGRAPHY CHEST, ABDOMEN AND PELVIS TECHNIQUE: Non-contrast CT of the chest was initially obtained. Multidetector CT imaging through the chest, abdomen and pelvis was performed using the standard protocol during bolus administration of intravenous contrast. Multiplanar reconstructed images and MIPs  were obtained and reviewed to evaluate the vascular anatomy. RADIATION DOSE REDUCTION: This exam was performed according to the departmental dose-optimization program which includes automated exposure control, adjustment of the mA and/or kV according to patient size and/or use of iterative reconstruction technique. CONTRAST:  8m OMNIPAQUE IOHEXOL 350 MG/ML SOLN COMPARISON:  08/18/2021 CT angiogram of the chest, abdomen and pelvis. FINDINGS: CTA CHEST FINDINGS Cardiovascular: Normal heart size. No significant pericardial effusion/thickening. Aortic valve prosthesis in place. Atherosclerotic thoracic aorta with dilated 4.2 cm ascending thoracic aorta, stable using similar measurement technique. No acute intramural hematoma, dissection, pseudoaneurysm or penetrating atherosclerotic ulcer in the thoracic aorta. Aortic arch branch vessels are patent. Top-normal caliber main pulmonary artery (3.2 cm diameter). No central pulmonary emboli. Mediastinum/Nodes: No discrete thyroid nodules. Unremarkable esophagus. No pathologically enlarged axillary, mediastinal or hilar lymph nodes. Lungs/Pleura: No pneumothorax. No pleural effusion. Mild centrilobular emphysema with diffuse bronchial wall thickening. No acute consolidative airspace disease, lung masses or significant pulmonary nodules. Musculoskeletal: No aggressive appearing focal osseous lesions. Intact sternotomy wires. Symmetric mild bilateral gynecomastia is unchanged. Review of the MIP images confirms the above findings. CTA ABDOMEN AND PELVIS FINDINGS VASCULAR Aorta: Atherosclerotic nonaneurysmal abdominal aorta with no dissection or significant stenosis. Celiac: Patent without evidence of aneurysm, dissection, vasculitis or significant stenosis. SMA: Patent without evidence of aneurysm, dissection, vasculitis or significant stenosis. Renals: Both renal arteries are patent without evidence of aneurysm, dissection, vasculitis, fibromuscular dysplasia or significant  stenosis. IMA: Patent without evidence of aneurysm, dissection, vasculitis or significant stenosis. 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 Otherwise patent visualized bilateral iliofemoral arteries with no dissection or aneurysm. Veins: Contrast reflux into the IVC and hepatic veins. Review of the MIP images confirms the above findings. NON-VASCULAR Hepatobiliary: Normal liver with no liver mass. Cholecystectomy. No biliary ductal dilatation. Pancreas: Normal, with no mass or duct dilation. Spleen: Normal size. No mass. Adrenals/Urinary Tract: Normal adrenals. No hydronephrosis. Simple 1.0 cm anterior lower right renal cyst, for which no imaging follow-up is recommended. No additional contour deforming renal masses. Normal bladder. Stomach/Bowel: Normal non-distended stomach. Normal caliber small bowel with no small bowel wall thickening. Appendix not discretely visualized. No pericecal inflammatory changes. Normal large bowel with no diverticulosis, large bowel wall  thickening or pericolonic fat stranding. Vascular/Lymphatic: No pathologically enlarged lymph nodes in the abdomen or pelvis. Reproductive: Normal size prostate. Other: No pneumoperitoneum, ascites or focal fluid collection. Musculoskeletal: No aggressive appearing focal osseous lesions. Mild lumbar spondylosis. Review of the MIP images confirms the above findings. IMPRESSION: 1. No acute aortic syndrome. No acute abnormality in the chest, abdomen or pelvis. 2. Stable dilated 4.2 cm ascending thoracic aorta. Recommend annual imaging followup by CTA or MRA. This recommendation follows 2010 ACCF/AHA/AATS/ACR/ASA/SCA/SCAI/SIR/STS/SVM Guidelines for the Diagnosis and Management of Patients with Thoracic Aortic Disease. Circulation. 2010; 121: U045-W098. Aortic aneurysm NOS (ICD10-I71.9). 3. Mild centrilobular emphysema with diffuse bronchial wall thickening, suggesting COPD. 4. Aortic Atherosclerosis (ICD10-I70.0) and  Emphysema (ICD10-J43.9). Electronically Signed   By: Ilona Sorrel M.D.   On: 09/22/2021 11:34    Procedures Procedures    Medications Ordered in ED Medications  lactated ringers bolus 1,000 mL (0 mLs Intravenous Stopped 09/22/21 1209)  haloperidol lactate (HALDOL) injection 4 mg (4 mg Intravenous Given 09/22/21 1107)  pantoprazole (PROTONIX) injection 40 mg (40 mg Intravenous Given 09/22/21 1114)  hydrALAZINE (APRESOLINE) injection 5 mg (5 mg Intravenous Given 09/22/21 1110)  iohexol (OMNIPAQUE) 350 MG/ML injection 100 mL (85 mLs Intravenous Contrast Given 09/22/21 1035)  metoCLOPramide (REGLAN) injection 10 mg (10 mg Intravenous Given 09/22/21 1218)  diphenhydrAMINE (BENADRYL) injection 25 mg (25 mg Intravenous Given 09/22/21 1216)  fentaNYL (SUBLIMAZE) injection 50 mcg (50 mcg Intravenous Given 09/22/21 1220)  labetalol (NORMODYNE) injection 10 mg (10 mg Intravenous Given 09/22/21 1221)    ED Course/ Medical Decision Making/ A&P                            56 year old male with a history of HIV, hypertension, CVA, pancreatitis, mechanical aortic valve replacement on Coumadin, GERD, 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  Differential diagnosis includes aortic dissection, cannabis hyperemesis, cyclic vomiting syndrome, gastroparesis, pancreatitis, choledocholithiasis, small bowel obstruction, hypertensive emergency, ACS.  EKG was completed and personally evaluated interpreted by me and shows LVH with secondary repolarization abnormality, similar to prior.  Labs are completed and personally evaluated interpreted by me show no evidence of pancreatitis or hepatitis, creatinine at baseline, no leukocytosis, no anemia.  Has significant abdominal tenderness on exam, do not suspect ACS.  CTA dissection study was ordered and showed no acute abnormality, stable dilated 4.2 cm ascending thoracic aorta.    For  his significant hypertension, he was given hydralazine.  He was given Haldol for his nausea, vomiting and abdominal pain as well as IV fluids for hydration. Reports improvement following this, has continuing nausea which is less severe. Given additional reglan, benadryl, fentanyl and labetalol with improvement in symptoms, HR, BP.  Able to tolerate po. Suspect likely cannabinoid hyperemesis or other cyclic vomiting with similar episodes in the past. Patient discharged in stable condition with understanding of reasons to return.         Final Clinical Impression(s) / ED Diagnoses Final diagnoses:  Nausea and vomiting, unspecified vomiting type  Generalized abdominal pain  Hypertension, unspecified type    Rx / DC Orders ED Discharge Orders          Ordered    PROMETHEGAN 25 MG suppository  Every 6 hours PRN        09/22/21 1552              Gareth Morgan, MD 09/22/21  2157  

## 2021-09-22 NOTE — ED Notes (Signed)
Reviewed AVS/discharge instruction with patient. Time allotted for and all questions answered. Patient is agreeable for d/c and escorted to ed exit by staff.  

## 2021-09-24 ENCOUNTER — Other Ambulatory Visit: Payer: Self-pay

## 2021-09-24 ENCOUNTER — Emergency Department (HOSPITAL_BASED_OUTPATIENT_CLINIC_OR_DEPARTMENT_OTHER)
Admission: EM | Admit: 2021-09-24 | Discharge: 2021-09-25 | Disposition: A | Discharge status: Home / Self Care | Payer: 59 | Attending: Emergency Medicine | Admitting: Emergency Medicine

## 2021-09-24 ENCOUNTER — Encounter: Payer: Self-pay | Admitting: Neurology

## 2021-09-24 ENCOUNTER — Encounter (HOSPITAL_BASED_OUTPATIENT_CLINIC_OR_DEPARTMENT_OTHER): Payer: Self-pay | Admitting: Obstetrics and Gynecology

## 2021-09-24 DIAGNOSIS — I10 Essential (primary) hypertension: Secondary | ICD-10-CM | POA: Diagnosis not present

## 2021-09-24 DIAGNOSIS — E876 Hypokalemia: Secondary | ICD-10-CM | POA: Insufficient documentation

## 2021-09-24 DIAGNOSIS — Z79899 Other long term (current) drug therapy: Secondary | ICD-10-CM | POA: Diagnosis not present

## 2021-09-24 DIAGNOSIS — Z21 Asymptomatic human immunodeficiency virus [HIV] infection status: Secondary | ICD-10-CM | POA: Insufficient documentation

## 2021-09-24 DIAGNOSIS — R112 Nausea with vomiting, unspecified: Secondary | ICD-10-CM | POA: Diagnosis present

## 2021-09-24 DIAGNOSIS — R1013 Epigastric pain: Secondary | ICD-10-CM | POA: Insufficient documentation

## 2021-09-24 DIAGNOSIS — Z7901 Long term (current) use of anticoagulants: Secondary | ICD-10-CM | POA: Insufficient documentation

## 2021-09-24 LAB — BASIC METABOLIC PANEL
Anion gap: 11 (ref 5–15)
BUN: 31 mg/dL — ABNORMAL HIGH (ref 6–20)
CO2: 31 mmol/L (ref 22–32)
Calcium: 10.7 mg/dL — ABNORMAL HIGH (ref 8.9–10.3)
Chloride: 96 mmol/L — ABNORMAL LOW (ref 98–111)
Creatinine, Ser: 1.46 mg/dL — ABNORMAL HIGH (ref 0.61–1.24)
GFR, Estimated: 57 mL/min — ABNORMAL LOW (ref >60)
Glucose, Bld: 114 mg/dL — ABNORMAL HIGH (ref 70–99)
Potassium: 3.4 mmol/L — ABNORMAL LOW (ref 3.5–5.1)
Sodium: 138 mmol/L (ref 135–145)

## 2021-09-24 LAB — MAGNESIUM: Magnesium: 2.1 mg/dL (ref 1.7–2.4)

## 2021-09-24 LAB — CBC WITH DIFFERENTIAL/PLATELET
Abs Immature Granulocytes: 0.01 10*3/uL (ref 0.00–0.07)
Basophils Absolute: 0 10*3/uL (ref 0.0–0.1)
Basophils Relative: 0 %
Eosinophils Absolute: 0 10*3/uL (ref 0.0–0.5)
Eosinophils Relative: 0 %
HCT: 40 % (ref 39.0–52.0)
Hemoglobin: 14.2 g/dL (ref 13.0–17.0)
Immature Granulocytes: 0 %
Lymphocytes Relative: 18 %
Lymphs Abs: 1 10*3/uL (ref 0.7–4.0)
MCH: 39.1 pg — ABNORMAL HIGH (ref 26.0–34.0)
MCHC: 35.5 g/dL (ref 30.0–36.0)
MCV: 110.2 fL — ABNORMAL HIGH (ref 80.0–100.0)
Monocytes Absolute: 0.2 10*3/uL (ref 0.1–1.0)
Monocytes Relative: 5 %
Neutro Abs: 4.1 10*3/uL (ref 1.7–7.7)
Neutrophils Relative %: 77 %
Platelets: 231 10*3/uL (ref 150–400)
RBC: 3.63 MIL/uL — ABNORMAL LOW (ref 4.22–5.81)
RDW: 14.6 % (ref 11.5–15.5)
Smear Review: NORMAL
WBC: 5.4 10*3/uL (ref 4.0–10.5)
nRBC: 0 % (ref 0.0–0.2)

## 2021-09-24 LAB — LIPASE, BLOOD: Lipase: 49 U/L (ref 11–51)

## 2021-09-24 LAB — RAPID URINE DRUG SCREEN, HOSP PERFORMED
Amphetamines: NOT DETECTED
Barbiturates: NOT DETECTED
Benzodiazepines: NOT DETECTED
Cocaine: NOT DETECTED
Opiates: NOT DETECTED
Tetrahydrocannabinol: POSITIVE — AB

## 2021-09-24 LAB — PROTIME-INR
INR: 2.3 — ABNORMAL HIGH (ref 0.8–1.2)
Prothrombin Time: 24.9 seconds — ABNORMAL HIGH (ref 11.4–15.2)

## 2021-09-24 LAB — HEPATIC FUNCTION PANEL
ALT: 21 U/L (ref 0–44)
AST: 45 U/L — ABNORMAL HIGH (ref 15–41)
Albumin: 5.6 g/dL — ABNORMAL HIGH (ref 3.5–5.0)
Alkaline Phosphatase: 73 U/L (ref 38–126)
Bilirubin, Direct: 0.4 mg/dL — ABNORMAL HIGH (ref 0.0–0.2)
Indirect Bilirubin: 2 mg/dL — ABNORMAL HIGH (ref 0.3–0.9)
Total Bilirubin: 2.4 mg/dL — ABNORMAL HIGH (ref 0.3–1.2)
Total Protein: 8.8 g/dL — ABNORMAL HIGH (ref 6.5–8.1)

## 2021-09-24 LAB — TROPONIN I (HIGH SENSITIVITY): Troponin I (High Sensitivity): 60 ng/L — ABNORMAL HIGH (ref <18)

## 2021-09-24 MED ORDER — LACTATED RINGERS IV BOLUS
1000.0000 mL | Freq: Once | INTRAVENOUS | Status: AC
  Administered 2021-09-24: 1000 mL via INTRAVENOUS

## 2021-09-24 MED ORDER — HYDRALAZINE HCL 20 MG/ML IJ SOLN
10.0000 mg | Freq: Once | INTRAMUSCULAR | Status: AC
  Administered 2021-09-24: 10 mg via INTRAVENOUS
  Filled 2021-09-24: qty 1

## 2021-09-24 MED ORDER — DROPERIDOL 2.5 MG/ML IJ SOLN
2.5000 mg | Freq: Once | INTRAMUSCULAR | Status: AC
  Administered 2021-09-24: 2.5 mg via INTRAVENOUS
  Filled 2021-09-24: qty 2

## 2021-09-24 NOTE — Addendum Note (Signed)
Addended by: Yevette Edwards on: 09/24/2021 12:03 PM   Modules accepted: Orders

## 2021-09-24 NOTE — Telephone Encounter (Signed)
Called and spoke with patient. Pt states that he does not have a neurologist and would like to proceed with referral at this time. Pt is aware that I will place the referral and Neurology will contact him to set up an appt. Pt reported that he had not heard anything from ENT, I told the patient to expect a call within a couple of weeks as we just placed the referral. Pt verbalized understanding and had no concerns at the end of the call.  Ambulatory referral to Neurology in epic.

## 2021-09-24 NOTE — ED Triage Notes (Signed)
Patient reports to the ER for cyclic emesis. Patient reports this has been going on for 20 years and does not think that marijuana is the reason.

## 2021-09-24 NOTE — ED Provider Notes (Signed)
[facility] EMERGENC[facility] DEPT Provider Note   CSN: [id number] Arrival date & time: 09/24/21  1731     History {Add pertinent medical, surgical, social history, OB history to HPI:1} Chief Complaint  Patient presents with   Nausea   Emesis    ESTABAN MAINVILLE is a 56 y.o. male.   Emesis      Home Medications Prior to Admission medications   Medication Sig Start Date End Date Taking? Authorizing Provider  ARIPiprazole (ABILIFY) 5 MG tablet Take 1 tablet by mouth at bedtime. 04/21/20   [provider]  colestipol (COLESTID) 1 g tablet Take 1 tablet (1 g total) by mouth 2 (two) times daily. 09/07/21   Armbruster, Carlota Raspberry, MD  dolutegravir (TIVICAY) 50 MG tablet Take 1 tablet (50 mg total) by mouth 2 (two) times daily. 06/27/21   Truman Hayward, MD  emtricitabine-tenofovir AF (DESCOVY) 200-25 MG tablet Take 1 tablet by mouth daily. 06/27/21   Truman Hayward, MD  EPINEPHrine (EPI-PEN) 0.3 mg/0.3 mL DEVI Inject 0.3 mLs (0.3 mg total) into the muscle once. 08/20/12   Truman Hayward, MD  escitalopram (LEXAPRO) 20 MG tablet TAKE 1 TABLET BY MOUTH   DAILY Patient taking differently: Take 20 mg by mouth daily. 02/14/16   Truman Hayward, MD  fostemsavir tromethamine (RUKOBIA) 600 MG TB12 ER tablet Take 1 tablet by mouth every 12 (twelve) hours. 06/27/21   Truman Hayward, MD  furosemide (LASIX) 20 MG tablet Please take one tablet 20 mg by mouth daily as needed for shortness of breath. 01/04/20   [provider]  LORazepam (ATIVAN) 1 MG tablet Take 1 mg by mouth at bedtime as needed for anxiety or sleep.    [provider]  losartan (COZAAR) 25 MG tablet Take 25 mg by mouth daily. 09/24/19   [provider]  metoprolol succinate (TOPROL-XL) 25 MG 24 hr tablet Take 25 mg by mouth daily. 11/18/19   [provider]  ondansetron (ZOFRAN) 4 MG tablet Take 1 tablet (4 mg total) by mouth every 6 (six) hours. 04/04/21    Couture, Cortni S, PA-C  ondansetron (ZOFRAN-ODT) 8 MG disintegrating tablet Take 1 tablet (8 mg total) by mouth every 8 (eight) hours as needed for nausea or vomiting. 07/13/20   Tommy Medal, Lavell Islam, MD  Opium 10 MG/ML (1%) TINC Take 0.6 mLs (6 mg total) by mouth every 6 (six) hours. 07/25/16   Truman Hayward, MD  Pitavastatin Calcium 1 MG TABS Take 1 tablet (1 mg total) by mouth daily. 06/27/21   Truman Hayward, MD  polyethylene glycol powder [facility]) 17 GM/SCOOP powder Take 17 g by mouth daily. 06/19/21   [provider]  PROMETHEGAN 25 MG suppository Place 1 suppository (25 mg total) rectally every 6 (six) hours as needed for nausea or vomiting. 09/22/21   Gareth Morgan, MD  traZODone (DESYREL) 50 MG tablet Take 50 mg by mouth at bedtime.  12/11/18   [provider]  valACYclovir (VALTREX) 1000 MG tablet Take 1 tablet (1,000 mg total) by mouth daily. 12/15/20   Truman Hayward, MD  warfarin (COUMADIN) 1 MG tablet Take 1 mg by mouth every Friday. Take as directed with '5mg'$  tablet per [facility] on Fridays. 03/24/14   [provider]  warfarin (COUMADIN) 4 MG tablet Take by mouth See admin instructions. Takes '5MG'$  and '4MG'$  together ('9MG'$  total) on Sundays and Tuesdays 10/22/19   [provider]  warfarin (COUMADIN) 5 MG tablet Take 5 mg by mouth See admin instructions. Takes 5 MG on Monday, Wednesday, Thursday, Friday and Saturday, then 9 MG on Sunday and Tuesday    [provider]  zidovudine (RETROVIR) 300 MG tablet Take 1 tablet (300 mg total) by mouth 2 (two) times daily. 06/27/21   Truman Hayward, MD      Allergies    Bactrim [sulfamethoxazole-trimethoprim], Bee venom, Sulfa antibiotics, Truvada [emtricitabine-tenofovir df], Lidoderm [lidocaine], Raltegravir, Ceftriaxone, and Sulfamethoxazole    Review of Systems   Review of Systems  Gastrointestinal:  Positive for vomiting.    Physical Exam Updated Vital Signs BP  (!) 216/105 (BP Location: Right Arm)   Pulse 96   Temp 98.1 F (36.7 C)   Resp 16   Ht '5\' 8"'$  (1.727 m)   Wt 56.7 kg   SpO2 100%   BMI 19.01 kg/m  Physical Exam  ED Results / Procedures / Treatments   Labs (all labs ordered are listed, but only abnormal results are displayed) Labs Reviewed - No data to display  EKG None  Radiology No results found.  Procedures Procedures  {Document cardiac monitor, telemetry assessment procedure when appropriate:1}  Medications Ordered in ED Medications - No data to display  ED Course/ Medical Decision Making/ A&P                           Medical Decision Making  ***  {Document critical care time when appropriate:1} {Document review of labs and clinical decision tools ie heart score, Chads2Vasc2 etc:1}  {Document your independent review of radiology images, and any outside records:1} {Document your discussion with family members, caretakers, and with consultants:1} {Document social determinants of health affecting pt's care:1} {Document your decision making why or why not admission, treatments were needed:1} Final Clinical Impression(s) / ED Diagnoses Final diagnoses:  None    Rx / DC Orders ED Discharge Orders     None

## 2021-09-25 LAB — TROPONIN I (HIGH SENSITIVITY): Troponin I (High Sensitivity): 58 ng/L — ABNORMAL HIGH (ref <18)

## 2021-09-25 MED ORDER — ONDANSETRON HCL 8 MG PO TABS: 8.0000 mg | ORAL_TABLET | ORAL | 0 refills | Status: AC | PRN

## 2021-09-25 NOTE — ED Provider Notes (Signed)
  Physical Exam  BP 122/74   Pulse 93   Temp 98.3 F (36.8 C)   Resp 16   Ht '5\' 8"'$  (1.727 m)   Wt 56.7 kg   SpO2 99%   BMI 19.01 kg/m   Physical Exam Vitals and nursing note reviewed.  Constitutional:      General: He is not in acute distress.    Appearance: He is well-developed. He is not diaphoretic.  HENT:     Head: Normocephalic and atraumatic.  Cardiovascular:     Rate and Rhythm: Normal rate and regular rhythm.     Heart sounds: No murmur heard.    No friction rub.  Pulmonary:     Effort: Pulmonary effort is normal. No respiratory distress.     Breath sounds: Normal breath sounds. No wheezing or rales.  Abdominal:     General: Bowel sounds are normal. There is no distension.     Palpations: Abdomen is soft.     Tenderness: There is no abdominal tenderness.  Musculoskeletal:        General: Normal range of motion.     Cervical back: Normal range of motion and neck supple.  Skin:    General: Skin is warm and dry.  Neurological:     Mental Status: He is alert and oriented to person, place, and time.     Coordination: Coordination normal.     Procedures  Procedures  ED Course / MDM  Care assumed from Dr. Armandina Gemma at shift change.  Patient presenting here with complaints of vomiting.  Care signed out to me awaiting results of second troponin and p.o. challenge.  Second troponin has returned negative and patient tolerating p.o.  At this point, discharge seems appropriate.  Vomiting likely related to cannabis.  Patient to follow-up as needed.       Veryl Speak, MD 09/25/21 [id number]

## 2021-09-25 NOTE — Discharge Instructions (Signed)
Begin taking Zofran as prescribed.  Clear liquid diet for the next 12 hours, then slowly advance to normal as tolerated.  Return to the ER if symptoms significantly worsen or change.

## 2021-09-25 NOTE — ED Notes (Signed)
Reviewed AVS/discharge instruction with patient. Time allotted for and all questions answered. Patient is agreeable for d/c and escorted to ed exit by staff.  

## 2021-09-25 NOTE — ED Notes (Signed)
Patinet reports feeling much better.  Juice and crackers at bedside for PO challenge.

## 2021-09-25 NOTE — ED Notes (Signed)
Patient drank 8oz juice and reports feeling "better"  Denies nausea, no vomitting

## 2021-11-09 ENCOUNTER — Telehealth: Payer: Self-pay

## 2021-11-09 NOTE — Telephone Encounter (Signed)
PA for [place] submitted via covermymeds, awaiting response.   Key: [place], RN

## 2021-11-15 NOTE — Telephone Encounter (Signed)
PA approved and approval faxed to pharmacy.   Beryle Flock, RN

## 2021-11-16 ENCOUNTER — Other Ambulatory Visit (HOSPITAL_COMMUNITY): Payer: Self-pay

## 2021-11-16 ENCOUNTER — Telehealth: Payer: Self-pay

## 2021-11-16 NOTE — Telephone Encounter (Signed)
PA for [place] submitted via covermymeds 9/8. Awaiting response.   Key: [id number]  Beryle Flock, RN

## 2021-11-27 NOTE — Telephone Encounter (Signed)
PA denied. Will notify provider.   Beryle Flock, RN

## 2021-11-29 ENCOUNTER — Other Ambulatory Visit: Payer: Self-pay | Admitting: Infectious Disease

## 2021-11-29 MED ORDER — ROSUVASTATIN CALCIUM 20 MG PO TABS: 20.0000 mg | ORAL_TABLET | Freq: Every day | ORAL | 11 refills | Status: DC

## 2021-12-25 ENCOUNTER — Telehealth: Payer: Self-pay

## 2021-12-25 NOTE — Telephone Encounter (Signed)
PA for [place] faxed to [facility]. Awaiting response.   Beryle Flock, RN

## 2021-12-31 ENCOUNTER — Other Ambulatory Visit: Payer: Self-pay

## 2021-12-31 ENCOUNTER — Other Ambulatory Visit (HOSPITAL_COMMUNITY)
Admission: RE | Admit: 2021-12-31 | Discharge: 2021-12-31 | Disposition: A | Discharge status: Home / Self Care | Payer: 59 | Source: Ambulatory Visit | Attending: Infectious Disease | Admitting: Infectious Disease

## 2021-12-31 ENCOUNTER — Ambulatory Visit (INDEPENDENT_AMBULATORY_CARE_PROVIDER_SITE_OTHER): Payer: 59 | Admitting: Infectious Disease

## 2021-12-31 ENCOUNTER — Ambulatory Visit (INDEPENDENT_AMBULATORY_CARE_PROVIDER_SITE_OTHER): Payer: 59

## 2021-12-31 ENCOUNTER — Encounter: Payer: Self-pay | Admitting: Infectious Disease

## 2021-12-31 VITALS — BP 115/70 | HR 83 | Temp 98.5°F

## 2021-12-31 DIAGNOSIS — Z952 Presence of prosthetic heart valve: Secondary | ICD-10-CM | POA: Diagnosis not present

## 2021-12-31 DIAGNOSIS — Z23 Encounter for immunization: Secondary | ICD-10-CM

## 2021-12-31 DIAGNOSIS — B20 Human immunodeficiency virus [HIV] disease: Secondary | ICD-10-CM | POA: Diagnosis present

## 2021-12-31 DIAGNOSIS — Z7185 Encounter for immunization safety counseling: Secondary | ICD-10-CM | POA: Insufficient documentation

## 2021-12-31 DIAGNOSIS — Z79899 Other long term (current) drug therapy: Secondary | ICD-10-CM

## 2021-12-31 MED ORDER — ROSUVASTATIN CALCIUM 20 MG PO TABS: 20.0000 mg | ORAL_TABLET | Freq: Every day | ORAL | 11 refills | Status: DC

## 2021-12-31 MED ORDER — RUKOBIA 600 MG PO TB12: ORAL_TABLET | ORAL | 11 refills | Status: DC

## 2021-12-31 MED ORDER — TIVICAY 50 MG PO TABS: 50.0000 mg | ORAL_TABLET | Freq: Two times a day (BID) | ORAL | 11 refills | Status: DC

## 2021-12-31 MED ORDER — DESCOVY 200-25 MG PO TABS: 1.0000 | ORAL_TABLET | Freq: Every day | ORAL | 11 refills | Status: DC

## 2021-12-31 MED ORDER — ZIDOVUDINE 300 MG PO TABS: 300.0000 mg | ORAL_TABLET | Freq: Two times a day (BID) | ORAL | 11 refills | Status: DC

## 2021-12-31 NOTE — Progress Notes (Signed)
Subjective:   Chief complaint: follow-up for HIV disease on medications   Patient ID: Brandon [name], male    DOB: 1965-05-21, 56 y.o.   MRN: [id number]  HPI   56 year old Brandon [name] is a highly complicated man with history of  HIV/AIDS and Multi-DRUG RESISTANT virus formerly followed at [facility]. His HIV nadir was  20 when we first met him     He had been on various complicated antiretroviral regimens in the past, with unfortunate GENOTYPIC resistance to all non-nucleoside reverse transcriptase inhibitors and all NRTIs, Resistance to all protease inhibitors with the exception of Prezista which had some activity genotypically,, Resistance to Isentress, and Elvitegravir and  reduced S to dolutegravir having both a 148H and 140S  and with Dual tropic virus.   02/12/2010 phenotype at [facility] showed:  RT: NRTI: ABC, DDI, D4T, AZT, TDF: resistant; 3TC, FTC: susceptible; NNRTI: EFV susceptible; RPV, NVP, ETR, DLV: Resistant; PI: pan-resistant   He then decided to go back onto [facility] and WAS  referred to [facility].   We  Had  seen him and placed him on a  salvage regimen of Prezista 68m  Twice daily boosted with Norvir 1028mtwice daily, Tivicay twice daily, Combivir twice daily and once daily Viread.   And since then he haD BEEN WITH AN UNDETECTABLE VIRAL LOAD <20 FOR MORE THAN  four  YEARS  And  Healthy CD4 count.   Did have one time where his viral load popped into the thousands but there was no resistance seen and he was able to resuppress on :  --Tivicay 5053mID --Prezista 600m74mD with  --Norvir 100mg72m -AZT  BID --DESCOVY q daily   He continued  to have problems with nausea and vomiting that he attributes to DRV boosted w RTV   We subsequently switched off DRV/RTV BID for Fostemsavir BID along with continuing the remainder of his salvage regimen.  His nausea had dramatically improved and he states that he does not vomit nearly as frequently as he did before on the  prior regimen.   I offered idea of swapping in injectable LEN for Rukobia but FletcGriffonly dislikes needles so that does not appeal to him.      Past Medical History:  Diagnosis Date   Abdominal pain    Anemia    Aortic atherosclerosis (HCC)    Arthritis    Back pain 02/13/2016   Constipation    Depression    Diarrhea    Diverticulosis    Family history of CVA 06/27/2021   Foot lesion 12/19/2014   Gallstones    Gastric AVM    GERD (gastroesophageal reflux disease)    GI bleed    HIV (human immunodeficiency virus infection) (HCC) [place]ension    IBS (irritable bowel syndrome)    Infectious colitis    Internal hemorrhoids    Interstitial cystitis    Lymphopenia 05/31/2020   Mechanical heart valve present    Nausea & vomiting    Osteopenia 06/02/2018   Pancreatitis    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   Stroke (HCC) [place], unint[place]     Past Surgical History:  Procedure Laterality Date   AORTIC VALVE REPLACEMENT     CARDIAC SURGERY     CHOLECYSTECTOMY  02/12/2012   Procedure: LAPAROSCOPIC CHOLECYSTECTOMY;  Surgeon: FaeraStark Klein  [facility];  Laterality: N/A;   COLONOSCOPY WITH ESOPHAGOGASTRODUODENOSCOPY (EGD)  with polypectomy   ESOPHAGOGASTRODUODENOSCOPY N/A 07/24/2012   Procedure: ESOPHAGOGASTRODUODENOSCOPY (EGD);  Surgeon: Beryle Beams, MD;  Location: [facility] ENDOSCOPY;  Service: Endoscopy;  Laterality: N/A;   KNEE SURGERY     MULTIPLE EXTRACTIONS WITH ALVEOLOPLASTY N/A 03/13/2018   Procedure: MULTIPLE EXTRACTION;  Surgeon: Diona Browner, DDS;  Location: [facility];  Service: Oral Surgery;  Laterality: N/A;    Family History  Problem Relation Age of Onset   Hypertension Father    Prostate cancer Father    Stomach cancer Father    Hypertension Sister    Diabetes Maternal Aunt    Cancer - Other Cousin    Parkinson's disease Paternal Aunt       Social History   Socioeconomic History   Marital  status: Married    Spouse name: Not on file   Number of children: 6   Years of education: Not on file   Highest education level: Not on file  Occupational History   Occupation: disability rep  Tobacco Use   Smoking status: Former    Packs/day: 0.10    Years: 20.00    Total pack years: 2.00    Types: Cigars, Cigarettes    Quit date: 08/02/2017    Years since quitting: 4.4   Smokeless tobacco: Never  Vaping Use   Vaping Use: Some days   Substances: CBD   Devices: CBD vaping  Substance and Sexual Activity   Alcohol use: Yes    Alcohol/week: 0.0 standard drinks of alcohol    Comment: rarely    Drug use: Yes    Frequency: 7.0 times per week    Types: Marijuana   Sexual activity: Yes    Partners: Female    Birth control/protection: Condom    Comment: declined condoms  Other Topics Concern   Not on file  Social History Narrative   Not on file   Social Determinants of Health   Financial Resource Strain: Not on file  Food Insecurity: Not on file  Transportation Needs: Not on file  Physical Activity: Not on file  Stress: Not on file  Social Connections: Not on file    Allergies  Allergen Reactions   Bactrim [Sulfamethoxazole-Trimethoprim]    Bee Venom Anaphylaxis   Sulfa Antibiotics Anaphylaxis   Truvada [Emtricitabine-Tenofovir Df] Anaphylaxis and Rash    Takes plain tenofovir at home   Lidoderm [Lidocaine] Other (See Comments)    Reaction unknown   Raltegravir     resistance   Ceftriaxone Rash   Sulfamethoxazole Itching, Other (See Comments) and Rash    Other reaction(s): Hypotension (ALLERGY/intolerance)     Current Outpatient Medications:    ARIPiprazole (ABILIFY) 5 MG tablet, Take 1 tablet by mouth at bedtime., Disp: , Rfl:    colestipol (COLESTID) 1 g tablet, Take 1 tablet (1 g total) by mouth 2 (two) times daily., Disp: 30 tablet, Rfl: 0   dolutegravir (TIVICAY) 50 MG tablet, Take 1 tablet (50 mg total) by mouth 2 (two) times daily., Disp: 60 tablet, Rfl:  11   emtricitabine-tenofovir AF (DESCOVY) 200-25 MG tablet, Take 1 tablet by mouth daily., Disp: 30 tablet, Rfl: 11   EPINEPHrine (EPI-PEN) 0.3 mg/0.3 mL DEVI, Inject 0.3 mLs (0.3 mg total) into the muscle once., Disp: 1 Device, Rfl: 1   escitalopram (LEXAPRO) 20 MG tablet, TAKE 1 TABLET BY MOUTH   DAILY (Patient taking differently: Take 20 mg by mouth daily.), Disp: 30 tablet, Rfl: 1   fostemsavir tromethamine (RUKOBIA) 600 MG TB12 ER tablet, Take 1  tablet by mouth every 12 (twelve) hours., Disp: 60 tablet, Rfl: 11   furosemide (LASIX) 20 MG tablet, Please take one tablet 20 mg by mouth daily as needed for shortness of breath., Disp: , Rfl:    LORazepam (ATIVAN) 1 MG tablet, Take 1 mg by mouth at bedtime as needed for anxiety or sleep., Disp: , Rfl:    losartan (COZAAR) 25 MG tablet, Take 25 mg by mouth daily., Disp: , Rfl:    metoprolol succinate (TOPROL-XL) 25 MG 24 hr tablet, Take 25 mg by mouth daily., Disp: , Rfl:    ondansetron (ZOFRAN) 8 MG tablet, Take 1 tablet (8 mg total) by mouth every 4 (four) hours as needed for nausea., Disp: 10 tablet, Rfl: 0   ondansetron (ZOFRAN-ODT) 8 MG disintegrating tablet, Take 1 tablet (8 mg total) by mouth every 8 (eight) hours as needed for nausea or vomiting., Disp: 30 tablet, Rfl: 5   Opium 10 MG/ML (1%) TINC, Take 0.6 mLs (6 mg total) by mouth every 6 (six) hours., Disp: 72 mL, Rfl: 0   polyethylene glycol powder (GLYCOLAX/MIRALAX) 17 GM/SCOOP powder, Take 17 g by mouth daily., Disp: , Rfl:    PROMETHEGAN 25 MG suppository, Place 1 suppository (25 mg total) rectally every 6 (six) hours as needed for nausea or vomiting., Disp: 12 each, Rfl: 0   rosuvastatin (CRESTOR) 20 MG tablet, Take 1 tablet (20 mg total) by mouth daily., Disp: 30 tablet, Rfl: 11   traZODone (DESYREL) 50 MG tablet, Take 50 mg by mouth at bedtime. , Disp: , Rfl:    valACYclovir (VALTREX) 1000 MG tablet, Take 1 tablet (1,000 mg total) by mouth daily., Disp: 30 tablet, Rfl: 11   warfarin  (COUMADIN) 1 MG tablet, Take 1 mg by mouth every Friday. Take as directed with 62m tablet per [facility] on Fridays., Disp: , Rfl:    warfarin (COUMADIN) 4 MG tablet, Take by mouth See admin instructions. Takes 5MG and 4MG together (9MG total) on Sundays and Tuesdays, Disp: , Rfl:    warfarin (COUMADIN) 5 MG tablet, Take 5 mg by mouth See admin instructions. Takes 5 MG on Monday, Wednesday, Thursday, Friday and Saturday, then 9 MG on Sunday and Tuesday, Disp: , Rfl:    zidovudine (RETROVIR) 300 MG tablet, Take 1 tablet (300 mg total) by mouth 2 (two) times daily., Disp: 60 tablet, Rfl: 11 No current facility-administered medications for this visit.  Facility-Administered Medications Ordered in Other Visits:    0.9 %  sodium chloride infusion, , Intravenous, Once, HCampbell Riches MD  Review of Systems  Constitutional:  Negative for activity change, appetite change, chills, diaphoresis, fatigue, fever and unexpected weight change.  HENT:  Negative for congestion, rhinorrhea, sinus pressure, sneezing, sore throat and trouble swallowing.   Eyes:  Negative for photophobia and visual disturbance.  Respiratory:  Negative for cough, chest tightness, shortness of breath, wheezing and stridor.   Cardiovascular:  Negative for chest pain, palpitations and leg swelling.  Gastrointestinal:  Positive for nausea. Negative for abdominal distention, abdominal pain, anal bleeding, blood in stool, constipation, diarrhea and vomiting.  Genitourinary:  Negative for difficulty urinating, dysuria, flank pain and hematuria.  Musculoskeletal:  Negative for arthralgias, back pain, gait problem, joint swelling and myalgias.  Skin:  Negative for color change, pallor, rash and wound.  Neurological:  Negative for dizziness, tremors, weakness and light-headedness.  Hematological:  Negative for adenopathy. Does not bruise/bleed easily.  Psychiatric/Behavioral:  Negative for agitation, behavioral problems, confusion,  decreased concentration,  dysphoric mood and sleep disturbance.        Objective:   Physical Exam Constitutional:      Appearance: He is well-developed.  HENT:     Head: Normocephalic and atraumatic.  Eyes:     Conjunctiva/sclera: Conjunctivae normal.  Cardiovascular:     Rate and Rhythm: Normal rate and regular rhythm.  Pulmonary:     Effort: Pulmonary effort is normal. No respiratory distress.     Breath sounds: No wheezing.  Abdominal:     General: There is no distension.     Palpations: Abdomen is soft.  Musculoskeletal:        General: No tenderness. Normal range of motion.     Cervical back: Normal range of motion and neck supple.  Skin:    General: Skin is warm and dry.     Coloration: Skin is not pale.     Findings: No erythema or rash.  Neurological:     General: No focal deficit present.     Mental Status: He is alert and oriented to person, place, and time.  Psychiatric:        Mood and Affect: Mood normal.        Behavior: Behavior normal.        Thought Content: Thought content normal.        Judgment: Judgment normal.           Assessment & Plan:   HIV disease:  I will add order HIV viral load CD4 count CBC with differential CMP, RPR GC and chlamydia and I will continue  Brandon [name]'s twice daily TIVICAY, twice daily Rukobia twice daily AZT and once daily DESCOVY scription's  Hyperlipidemia I am continue his Crestor    Aortic valve replacement on Coumadin  Nausea: Has been followed by GI closely   Counseling recommended flu and COVID vaccines which he received.

## 2022-01-01 LAB — T-HELPER CELLS (CD4) COUNT (NOT AT ARMC)
CD4 % Helper T Cell: 30 % — ABNORMAL LOW (ref 33–65)
CD4 T Cell Abs: 213 /uL — ABNORMAL LOW (ref 400–1790)

## 2022-01-02 ENCOUNTER — Telehealth: Payer: Self-pay

## 2022-01-02 ENCOUNTER — Other Ambulatory Visit: Payer: Self-pay | Admitting: Infectious Disease

## 2022-01-02 DIAGNOSIS — B20 Human immunodeficiency virus [HIV] disease: Secondary | ICD-10-CM

## 2022-01-02 LAB — RPR: RPR Ser Ql: NONREACTIVE

## 2022-01-02 LAB — COMPLETE METABOLIC PANEL WITH GFR
AG Ratio: 1.8 (calc) (ref 1.0–2.5)
ALT: 19 U/L (ref 9–46)
AST: 35 U/L (ref 10–35)
Albumin: 4.1 g/dL (ref 3.6–5.1)
Alkaline phosphatase (APISO): 73 U/L (ref 35–144)
BUN/Creatinine Ratio: 16 (calc) (ref 6–22)
BUN: 24 mg/dL (ref 7–25)
CO2: 25 mmol/L (ref 20–32)
Calcium: 8.5 mg/dL — ABNORMAL LOW (ref 8.6–10.3)
Chloride: 105 mmol/L (ref 98–110)
Creat: 1.51 mg/dL — ABNORMAL HIGH (ref 0.70–1.30)
Globulin: 2.3 g/dL (calc) (ref 1.9–3.7)
Glucose, Bld: 95 mg/dL (ref 65–99)
Potassium: 4.1 mmol/L (ref 3.5–5.3)
Sodium: 138 mmol/L (ref 135–146)
Total Bilirubin: 0.7 mg/dL (ref 0.2–1.2)
Total Protein: 6.4 g/dL (ref 6.1–8.1)
eGFR: 55 mL/min/{1.73_m2} — ABNORMAL LOW (ref >=60)

## 2022-01-02 LAB — CBC WITH DIFFERENTIAL/PLATELET
Absolute Monocytes: 292 cells/uL (ref 200–950)
Basophils Absolute: 10 cells/uL (ref 0–200)
Basophils Relative: 0.5 %
Eosinophils Absolute: 0 cells/uL — ABNORMAL LOW (ref 15–500)
Eosinophils Relative: 0 %
HCT: 32.4 % — ABNORMAL LOW (ref 38.5–50.0)
Hemoglobin: 11.7 g/dL — ABNORMAL LOW (ref 13.2–17.1)
Lymphs Abs: 748 cells/uL — ABNORMAL LOW (ref 850–3900)
MCH: 37.1 pg — ABNORMAL HIGH (ref 27.0–33.0)
MCHC: 36.1 g/dL — ABNORMAL HIGH (ref 32.0–36.0)
MCV: 102.9 fL — ABNORMAL HIGH (ref 80.0–100.0)
MPV: 11.8 fL (ref 7.5–12.5)
Monocytes Relative: 14.6 %
Neutro Abs: 950 cells/uL — ABNORMAL LOW (ref 1500–7800)
Neutrophils Relative %: 47.5 %
Platelets: 120 10*3/uL — ABNORMAL LOW (ref 140–400)
RBC: 3.15 10*6/uL — ABNORMAL LOW (ref 4.20–5.80)
RDW: 13.6 % (ref 11.0–15.0)
Total Lymphocyte: 37.4 %
WBC: 2 10*3/uL — ABNORMAL LOW (ref 3.8–10.8)

## 2022-01-02 LAB — LIPID PANEL
Cholesterol: 112 mg/dL (ref <200)
HDL: 42 mg/dL (ref >=40)
LDL Cholesterol (Calc): 50 mg/dL (calc)
Non-HDL Cholesterol (Calc): 70 mg/dL (calc) (ref <130)
Total CHOL/HDL Ratio: 2.7 (calc) (ref <5.0)
Triglycerides: 110 mg/dL (ref <150)

## 2022-01-02 LAB — URINE CYTOLOGY ANCILLARY ONLY
Chlamydia: NEGATIVE
Comment: NEGATIVE
Comment: NORMAL
Neisseria Gonorrhea: NEGATIVE

## 2022-01-02 LAB — HIV-1 RNA QUANT-NO REFLEX-BLD
HIV 1 RNA Quant: 261000 Copies/mL — ABNORMAL HIGH
HIV-1 RNA Quant, Log: 5.42 Log cps/mL — ABNORMAL HIGH

## 2022-01-02 NOTE — Telephone Encounter (Signed)
Spoke with Kris Mouton, he says he has only missed two doses. He will come in for genotype testing tomorrow. 3 week follow up with provider scheduled.   Beryle Flock, RN

## 2022-01-02 NOTE — Addendum Note (Signed)
Addended by: Lucie Leather D on: 01/02/2022 03:58 PM   Modules accepted: Orders

## 2022-01-02 NOTE — Telephone Encounter (Signed)
-----   Message from Truman Hayward, MD sent at 01/02/2022  3:30 PM EDT ----- Regarding: FW: What is going on w Kris Mouton? He must not be taking meds or if he is he's got more R now than he had before. We need to see if we can add pangenotipe To labs and also have him see me in next 3 weeka ----- Message ----- From: Interface, [facility] Lab Results In Sent: 12/31/2021  11:48 PM EDT To: Truman Hayward, MD

## 2022-01-03 ENCOUNTER — Telehealth: Payer: Self-pay

## 2022-01-03 ENCOUNTER — Encounter: Payer: Self-pay | Admitting: Infectious Diseases

## 2022-01-03 ENCOUNTER — Other Ambulatory Visit: Payer: 59

## 2022-01-03 ENCOUNTER — Other Ambulatory Visit: Payer: Self-pay

## 2022-01-03 DIAGNOSIS — B20 Human immunodeficiency virus [HIV] disease: Secondary | ICD-10-CM

## 2022-01-03 NOTE — Telephone Encounter (Signed)
Patient here for a lab appointment and requested to speak with a nurse.   He asked for a work note for tomorrow since he's not feeling well. Reports nausea, vomiting, and abdominal pain. Says this has been an ongoing issue for years. Has tried Zofran for nausea in the past.   He sees GI and has a PCP in [place].   Vitals taken at 1540: BP: 179/104 Temp: 98 oral SpO2: 100% room air HR: 98 bpm  He denies headache or vision changes. Declines offer to be transported to the emergency department. He declines going to urgent care as they have told him in the past that they are "not equipped" to help him.   Repeat BP 186/105 (1603 PM)  Janene Madeira, NP spoke with the patient and also recommended he go to urgent care/ED for evaluation.   His wife is here with him and agrees to take him to [facility] for evaluation tonight. Patient is agreeable with this plan. Stressed importance of urgent evaluation for hypertension. Patient verbalized understanding and has no further questions.   Beryle Flock, RN

## 2022-01-03 NOTE — Telephone Encounter (Signed)
Spoke with Brandon [name], advised him that Dr. Tommy Medal would like him to stop all HIV medications until his genotype testing comes back. Patient verbalized understanding and has no further questions.   Beryle Flock, RN

## 2022-01-04 LAB — T-HELPER CELL (CD4) - (RCID CLINIC ONLY)
CD4 % Helper T Cell: 19 % — ABNORMAL LOW (ref 33–65)
CD4 T Cell Abs: 163 /uL — ABNORMAL LOW (ref 400–1790)

## 2022-01-07 ENCOUNTER — Other Ambulatory Visit: Payer: Self-pay | Admitting: Infectious Disease

## 2022-01-09 NOTE — Telephone Encounter (Signed)
Called [facility] to follow up on request, no answer. Left voicemail requesting call back to triage.   P: [phone] ext. [place], RN

## 2022-01-12 LAB — HIV-1 GENOTYPING (RTI,PI,IN INHBTR): HIV-1 Genotype: DETECTED — AB

## 2022-01-12 LAB — COMPLETE METABOLIC PANEL WITH GFR
AG Ratio: 1.7 (calc) (ref 1.0–2.5)
ALT: 21 U/L (ref 9–46)
AST: 58 U/L — ABNORMAL HIGH (ref 10–35)
Albumin: 5.3 g/dL — ABNORMAL HIGH (ref 3.6–5.1)
Alkaline phosphatase (APISO): 88 U/L (ref 35–144)
BUN: 19 mg/dL (ref 7–25)
CO2: 24 mmol/L (ref 20–32)
Calcium: 10.2 mg/dL (ref 8.6–10.3)
Chloride: 101 mmol/L (ref 98–110)
Creat: 1.13 mg/dL (ref 0.70–1.30)
Globulin: 3.2 g/dL (calc) (ref 1.9–3.7)
Glucose, Bld: 134 mg/dL — ABNORMAL HIGH (ref 65–99)
Potassium: 4.3 mmol/L (ref 3.5–5.3)
Sodium: 139 mmol/L (ref 135–146)
Total Bilirubin: 1.5 mg/dL — ABNORMAL HIGH (ref 0.2–1.2)
Total Protein: 8.5 g/dL — ABNORMAL HIGH (ref 6.1–8.1)
eGFR: 77 mL/min/{1.73_m2} (ref >=60)

## 2022-01-12 LAB — LIPID PANEL
Cholesterol: 146 mg/dL (ref <200)
HDL: 63 mg/dL (ref >=40)
LDL Cholesterol (Calc): 67 mg/dL (calc)
Non-HDL Cholesterol (Calc): 83 mg/dL (calc) (ref <130)
Total CHOL/HDL Ratio: 2.3 (calc) (ref <5.0)
Triglycerides: 80 mg/dL (ref <150)

## 2022-01-12 LAB — CBC WITH DIFFERENTIAL/PLATELET
Absolute Monocytes: 152 cells/uL — ABNORMAL LOW (ref 200–950)
Basophils Absolute: 9 cells/uL (ref 0–200)
Basophils Relative: 0.3 %
Eosinophils Absolute: 0 cells/uL — ABNORMAL LOW (ref 15–500)
Eosinophils Relative: 0 %
HCT: 40 % (ref 38.5–50.0)
Hemoglobin: 14 g/dL (ref 13.2–17.1)
Lymphs Abs: 1107 cells/uL (ref 850–3900)
MCH: 36.3 pg — ABNORMAL HIGH (ref 27.0–33.0)
MCHC: 35 g/dL (ref 32.0–36.0)
MCV: 103.6 fL — ABNORMAL HIGH (ref 80.0–100.0)
MPV: 12.9 fL — ABNORMAL HIGH (ref 7.5–12.5)
Monocytes Relative: 4.9 %
Neutro Abs: 1832 cells/uL (ref 1500–7800)
Neutrophils Relative %: 59.1 %
Platelets: 173 10*3/uL (ref 140–400)
RBC: 3.86 10*6/uL — ABNORMAL LOW (ref 4.20–5.80)
RDW: 13.3 % (ref 11.0–15.0)
Total Lymphocyte: 35.7 %
WBC: 3.1 10*3/uL — ABNORMAL LOW (ref 3.8–10.8)

## 2022-01-12 LAB — RPR: RPR Ser Ql: NONREACTIVE

## 2022-01-12 LAB — HIV-1 RNA QUANT-NO REFLEX-BLD
HIV 1 RNA Quant: 6920 Copies/mL — ABNORMAL HIGH
HIV-1 RNA Quant, Log: 3.84 Log cps/mL — ABNORMAL HIGH

## 2022-01-14 ENCOUNTER — Telehealth: Payer: Self-pay

## 2022-01-14 NOTE — Telephone Encounter (Signed)
Spoke with Brandon [name], advised him that Dr. Tommy Medal would like him to resume his ART as it does not appear he has developed any new resistance. Reminded him of follow up appointment on 11/15. Patient verbalized understanding and has no further questions.   Beryle Flock, RN

## 2022-01-14 NOTE — Telephone Encounter (Signed)
-----   Message from Truman Hayward, MD sent at 01/13/2022 11:29 AM EST ----- Regarding: FW: Brandon [name] can resume his current regimen as it seems like his high viral load was not result of new  resistance. He needs to see me though and we may need to find ways to strengthen his regimen ----- Message ----- From: Interface, [facility] Lab Results In Sent: 01/03/2022  10:31 PM EST To: Truman Hayward, MD

## 2022-01-23 ENCOUNTER — Ambulatory Visit: Payer: 59 | Admitting: Infectious Disease

## 2022-01-30 ENCOUNTER — Ambulatory Visit (INDEPENDENT_AMBULATORY_CARE_PROVIDER_SITE_OTHER): Payer: 59 | Admitting: Infectious Disease

## 2022-01-30 ENCOUNTER — Encounter: Payer: Self-pay | Admitting: Infectious Disease

## 2022-01-30 ENCOUNTER — Other Ambulatory Visit: Payer: Self-pay

## 2022-01-30 VITALS — BP 122/77 | HR 90 | Temp 98.2°F | Ht 68.0 in | Wt 125.0 lb

## 2022-01-30 DIAGNOSIS — Z952 Presence of prosthetic heart valve: Secondary | ICD-10-CM

## 2022-01-30 DIAGNOSIS — R110 Nausea: Secondary | ICD-10-CM | POA: Diagnosis not present

## 2022-01-30 DIAGNOSIS — B20 Human immunodeficiency virus [HIV] disease: Secondary | ICD-10-CM | POA: Diagnosis not present

## 2022-01-30 NOTE — Progress Notes (Signed)
Subjective:   Chief complaint: Follow-up for HIV disease on medications   Patient ID: Brandon [name], male    DOB: December 04, 1965, 56 y.o.   MRN: [id number]  HPI   56 year old Brandon [name] is a highly complicated man with history of  HIV/AIDS and Multi-DRUG RESISTANT virus formerly followed at [facility]. His HIV nadir was  20 when we first met him     He had been on various complicated antiretroviral regimens in the past, with unfortunate GENOTYPIC resistance to all non-nucleoside reverse transcriptase inhibitors and all NRTIs, Resistance to all protease inhibitors with the exception of Prezista which had some activity genotypically,, Resistance to Isentress, and Elvitegravir and  reduced S to dolutegravir having both a 148H and 140S  and with Dual tropic virus.   02/12/2010 phenotype at [facility] showed:  RT: NRTI: ABC, DDI, D4T, AZT, TDF: resistant; 3TC, FTC: susceptible; NNRTI: EFV susceptible; RPV, NVP, ETR, DLV: Resistant; PI: pan-resistant   He then decided to go back onto [facility] and WAS  referred to [facility].   We  Had  seen him and placed him on a  salvage regimen of Prezista 642m  Twice daily boosted with Norvir 1073mtwice daily, Tivicay twice daily, Combivir twice daily and once daily Viread.   And since then he haD BEEN WITH AN UNDETECTABLE VIRAL [name] <20 FOR MORE THAN  four  YEARS  And  Healthy CD4 count.   Did have one time where his viral [name] popped into the thousands but there was no resistance seen and he was able to resuppress on :  --Tivicay 5058mID --Prezista 600m82mD with  --Norvir 100mg25m -AZT  BID --DESCOVY q daily   He continued  to have problems with nausea and vomiting that he attributes to DRV boosted w RTV   We subsequently switched off DRV/RTV BID for Fostemsavir BID along with continuing the remainder of his salvage regimen.  His nausea had dramatically improved and he states that he does [name] vomit nearly as frequently as he did before on the  prior regimen.   I offered idea of swapping in injectable LEN for Rukobia but FletcThinhly dislikes needles so that does [name] appeal to him.  When I last saw Brandon [name] had jumped over 260,000.    We brought him back literally 3 days later and his viral [name] was 6920 and resistance testing showed some resistance but [name] the amount of resistance we know that he has.   [name] came down in the ensuing lab check.  Lab Results  Component Value Date   HIV1RNAQUANT 6,920 (H) 01/03/2022     Says he is back taking the medications and continues to suffer from chronic nausea.  Vomiting and nausea/ Dr GessnCarlean Purliously suggest the idea of cyclic vomiting associated marijuana use.  Brandon [name] feel that marijuana use is made any difference when he has [name] been on marijuana he does smoke daily.      Past Medical History:  Diagnosis Date   Abdominal pain    Anemia    Aortic atherosclerosis (HCC)    Arthritis    Back pain 02/13/2016   Constipation    Depression    Diarrhea    Diverticulosis    Family history of CVA 06/27/2021   Foot lesion 12/19/2014   Gallstones    Gastric AVM    GERD (gastroesophageal reflux disease)    GI bleed    HIV (human immunodeficiency  virus infection) ([place])    Hypertension    IBS (irritable bowel syndrome)    Infectious colitis    Internal hemorrhoids    Interstitial cystitis    Lymphopenia 05/31/2020   Mechanical heart valve present    Nausea & vomiting    Osteopenia 06/02/2018   Pancreatitis    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   Stroke ([place])    Weight loss, unintentional     Past Surgical History:  Procedure Laterality Date   AORTIC VALVE REPLACEMENT     CARDIAC SURGERY     CHOLECYSTECTOMY  02/12/2012   Procedure: LAPAROSCOPIC CHOLECYSTECTOMY;  Surgeon: Stark Klein, MD;  Location: [facility] OR;  Service: General;  Laterality: N/A;   COLONOSCOPY WITH ESOPHAGOGASTRODUODENOSCOPY (EGD)     with polypectomy    ESOPHAGOGASTRODUODENOSCOPY N/A 07/24/2012   Procedure: ESOPHAGOGASTRODUODENOSCOPY (EGD);  Surgeon: Beryle Beams, MD;  Location: [facility] ENDOSCOPY;  Service: Endoscopy;  Laterality: N/A;   KNEE SURGERY     MULTIPLE EXTRACTIONS WITH ALVEOLOPLASTY N/A 03/13/2018   Procedure: MULTIPLE EXTRACTION;  Surgeon: Diona Browner, DDS;  Location: [place];  Service: Oral Surgery;  Laterality: N/A;    Family History  Problem Relation Age of Onset   Hypertension Father    Prostate cancer Father    Stomach cancer Father    Hypertension Sister    Diabetes Maternal Aunt    Cancer - Other Cousin    Parkinson's disease Paternal Aunt       Social History   Socioeconomic History   Marital status: Married    Spouse name: [name] on file   Number of children: 6   Years of education: [name] on file   Highest education level: [name] on file  Occupational History   Occupation: disability rep  Tobacco Use   Smoking status: Former    Packs/day: 0.10    Years: 20.00    Total pack years: 2.00    Types: Cigars, Cigarettes    Quit date: 08/02/2017    Years since quitting: 4.4   Smokeless tobacco: Never  Vaping Use   Vaping Use: Some days   Substances: CBD   Devices: CBD vaping  Substance and Sexual Activity   Alcohol use: Yes    Alcohol/week: 0.0 standard drinks of alcohol    Comment: rarely    Drug use: Yes    Frequency: 7.0 times per week    Types: Marijuana   Sexual activity: Yes    Partners: Female    Birth control/protection: Condom    Comment: declined condoms  Other Topics Concern   [name] on file  Social History Narrative   [name] on file   Social Determinants of Health   Financial Resource Strain: [name] on file  Food Insecurity: [name] on file  Transportation Needs: [name] on file  Physical Activity: [name] on file  Stress: [name] on file  Social Connections: [name] on file    Allergies  Allergen Reactions   Bactrim [Sulfamethoxazole-Trimethoprim]    Bee Venom Anaphylaxis   Sulfa Antibiotics Anaphylaxis    Truvada [Emtricitabine-Tenofovir Df] Anaphylaxis and Rash    Takes plain tenofovir at home   Lidoderm [Lidocaine] Other (See Comments)    Reaction unknown   Raltegravir     resistance   Ceftriaxone Rash   Sulfamethoxazole Itching, Other (See Comments) and Rash    Other reaction(s): Hypotension (ALLERGY/intolerance)     Current Outpatient Medications:    ARIPiprazole (ABILIFY) 5 MG tablet, Take 1 tablet by mouth at bedtime., Disp: , Rfl:  colestipol (COLESTID) 1 g tablet, Take 1 tablet (1 g total) by mouth 2 (two) times daily., Disp: 30 tablet, Rfl: 0   dolutegravir (TIVICAY) 50 MG tablet, Take 1 tablet (50 mg total) by mouth 2 (two) times daily., Disp: 60 tablet, Rfl: 11   emtricitabine-tenofovir AF (DESCOVY) 200-25 MG tablet, Take 1 tablet by mouth daily., Disp: 30 tablet, Rfl: 11   EPINEPHrine (EPI-PEN) 0.3 mg/0.3 mL DEVI, Inject 0.3 mLs (0.3 mg total) into the muscle once., Disp: 1 Device, Rfl: 1   escitalopram (LEXAPRO) 20 MG tablet, TAKE 1 TABLET BY MOUTH   DAILY (Patient taking differently: Take 20 mg by mouth daily.), Disp: 30 tablet, Rfl: 1   fostemsavir tromethamine (RUKOBIA) 600 MG TB12 ER tablet, Take 1 tablet by mouth every 12 (twelve) hours., Disp: 60 tablet, Rfl: 11   furosemide (LASIX) 20 MG tablet, Please take one tablet 20 mg by mouth daily as needed for shortness of breath., Disp: , Rfl:    LORazepam (ATIVAN) 1 MG tablet, Take 1 mg by mouth at bedtime as needed for anxiety or sleep., Disp: , Rfl:    losartan (COZAAR) 25 MG tablet, Take 25 mg by mouth daily., Disp: , Rfl:    metoprolol succinate (TOPROL-XL) 25 MG 24 hr tablet, Take 25 mg by mouth daily., Disp: , Rfl:    ondansetron (ZOFRAN) 8 MG tablet, Take 1 tablet (8 mg total) by mouth every 4 (four) hours as needed for nausea., Disp: 10 tablet, Rfl: 0   ondansetron (ZOFRAN-ODT) 8 MG disintegrating tablet, Take 1 tablet (8 mg total) by mouth every 8 (eight) hours as needed for nausea or vomiting., Disp: 30 tablet, Rfl:  5   Opium 10 MG/ML (1%) TINC, Take 0.6 mLs (6 mg total) by mouth every 6 (six) hours., Disp: 72 mL, Rfl: 0   polyethylene glycol powder (GLYCOLAX/MIRALAX) 17 GM/SCOOP powder, Take 17 g by mouth daily., Disp: , Rfl:    PROMETHEGAN 25 MG suppository, Place 1 suppository (25 mg total) rectally every 6 (six) hours as needed for nausea or vomiting., Disp: 12 each, Rfl: 0   rosuvastatin (CRESTOR) 20 MG tablet, Take 1 tablet (20 mg total) by mouth daily., Disp: 30 tablet, Rfl: 11   traZODone (DESYREL) 50 MG tablet, Take 50 mg by mouth at bedtime. , Disp: , Rfl:    valACYclovir (VALTREX) 1000 MG tablet, Take 1 tablet (1,000 mg total) by mouth daily., Disp: 30 tablet, Rfl: 11   warfarin (COUMADIN) 1 MG tablet, Take 1 mg by mouth every Friday. Take as directed with 51m tablet per [facility] on Fridays., Disp: , Rfl:    warfarin (COUMADIN) 4 MG tablet, Take by mouth See admin instructions. Takes 5MG and 4MG together (9MG total) on Sundays and Tuesdays, Disp: , Rfl:    warfarin (COUMADIN) 5 MG tablet, Take 5 mg by mouth See admin instructions. Takes 5 MG on Monday, Wednesday, Thursday, Friday and Saturday, then 9 MG on Sunday and Tuesday, Disp: , Rfl:    zidovudine (RETROVIR) 300 MG tablet, Take 1 tablet (300 mg total) by mouth 2 (two) times daily., Disp: 60 tablet, Rfl: 11 No current facility-administered medications for this visit.  Facility-Administered Medications Ordered in Other Visits:    0.9 %  sodium chloride infusion, , Intravenous, Once, HCampbell Riches MD  Review of Systems  Constitutional:  Negative for activity change, appetite change, chills, diaphoresis, fatigue, fever and unexpected weight change.  HENT:  Negative for congestion, rhinorrhea, sinus pressure, sneezing, sore throat  and trouble swallowing.   Eyes:  Negative for photophobia and visual disturbance.  Respiratory:  Negative for cough, chest tightness, shortness of breath, wheezing and stridor.   Cardiovascular:  Negative  for chest pain, palpitations and leg swelling.  Gastrointestinal:  Negative for abdominal distention, abdominal pain, anal bleeding, blood in stool, constipation, diarrhea, nausea and vomiting.  Genitourinary:  Negative for difficulty urinating, dysuria, flank pain and hematuria.  Musculoskeletal:  Negative for arthralgias, back pain, gait problem, joint swelling and myalgias.  Skin:  Negative for color change, pallor, rash and wound.  Neurological:  Negative for dizziness, tremors, weakness and light-headedness.  Hematological:  Negative for adenopathy. Does [name] bruise/bleed easily.  Psychiatric/Behavioral:  Negative for agitation, behavioral problems, confusion, decreased concentration, dysphoric mood and sleep disturbance.        Objective:   Physical Exam Constitutional:      Appearance: He is well-developed.  HENT:     Head: Normocephalic and atraumatic.  Eyes:     Conjunctiva/sclera: Conjunctivae normal.  Cardiovascular:     Rate and Rhythm: Normal rate and regular rhythm.  Pulmonary:     Effort: Pulmonary effort is normal. No respiratory distress.     Breath sounds: No wheezing.  Abdominal:     General: There is no distension.     Palpations: Abdomen is soft.  Musculoskeletal:        General: No tenderness. Normal range of motion.     Cervical back: Normal range of motion and neck supple.  Skin:    General: Skin is warm and dry.     Coloration: Skin is [name] pale.     Findings: No erythema or rash.  Neurological:     General: No focal deficit present.     Mental Status: He is alert and oriented to person, place, and time.  Psychiatric:        Mood and Affect: Mood normal.        Behavior: Behavior normal.        Thought Content: Thought content normal.        Judgment: Judgment normal.           Assessment & Plan:   HIV disease: Fully Adryan is suppressed again I will recheck a viral [name] with reflex to genotype as well as a CD4 count.  Will plan on  continuing his twice daily TIVICAY twice daily Rukobia twice daily AZT and once daily DESCOVY along with twice daily Rukobia   Hyperlipidemia continue Crestor   Aortic valve replacement on Coumadin  Nausea: Has been followed by GI closely I am [name] sure whawt to do to improve this

## 2022-02-04 LAB — HIV RNA, RTPCR W/R GT (RTI, PI,INT)
HIV 1 RNA Quant: 138 copies/mL — ABNORMAL HIGH
HIV-1 RNA Quant, Log: 2.14 Log copies/mL — ABNORMAL HIGH

## 2022-02-04 LAB — T-HELPER CELLS (CD4) COUNT (NOT AT ARMC)
Absolute CD4: 643 cells/uL (ref 490–1740)
CD4 T Helper %: 21 % — ABNORMAL LOW (ref 30–61)
Total lymphocyte count: 3110 cells/uL (ref 850–3900)

## 2022-02-05 ENCOUNTER — Emergency Department (HOSPITAL_COMMUNITY)
Admission: EM | Admit: 2022-02-05 | Discharge: 2022-02-06 | Disposition: A | Discharge status: Home / Self Care | Payer: 59 | Attending: Emergency Medicine | Admitting: Emergency Medicine

## 2022-02-05 ENCOUNTER — Other Ambulatory Visit: Payer: Self-pay

## 2022-02-05 ENCOUNTER — Encounter (HOSPITAL_COMMUNITY): Payer: Self-pay | Admitting: Emergency Medicine

## 2022-02-05 ENCOUNTER — Emergency Department (HOSPITAL_COMMUNITY): Payer: 59

## 2022-02-05 DIAGNOSIS — R109 Unspecified abdominal pain: Secondary | ICD-10-CM | POA: Diagnosis present

## 2022-02-05 DIAGNOSIS — Z21 Asymptomatic human immunodeficiency virus [HIV] infection status: Secondary | ICD-10-CM | POA: Diagnosis not present

## 2022-02-05 DIAGNOSIS — N3090 Cystitis, unspecified without hematuria: Secondary | ICD-10-CM | POA: Diagnosis not present

## 2022-02-05 LAB — COMPREHENSIVE METABOLIC PANEL
ALT: 16 U/L (ref 0–44)
AST: 32 U/L (ref 15–41)
Albumin: 4.2 g/dL (ref 3.5–5.0)
Alkaline Phosphatase: 72 U/L (ref 38–126)
Anion gap: 12 (ref 5–15)
BUN: 16 mg/dL (ref 6–20)
CO2: 28 mmol/L (ref 22–32)
Calcium: 9.7 mg/dL (ref 8.9–10.3)
Chloride: 101 mmol/L (ref 98–111)
Creatinine, Ser: 1.49 mg/dL — ABNORMAL HIGH (ref 0.61–1.24)
GFR, Estimated: 55 mL/min — ABNORMAL LOW (ref >60)
Glucose, Bld: 105 mg/dL — ABNORMAL HIGH (ref 70–99)
Potassium: 4 mmol/L (ref 3.5–5.1)
Sodium: 141 mmol/L (ref 135–145)
Total Bilirubin: 1.3 mg/dL — ABNORMAL HIGH (ref 0.3–1.2)
Total Protein: 7.1 g/dL (ref 6.5–8.1)

## 2022-02-05 LAB — CBC
HCT: 38.1 % — ABNORMAL LOW (ref 39.0–52.0)
Hemoglobin: 12.8 g/dL — ABNORMAL LOW (ref 13.0–17.0)
MCH: 36.7 pg — ABNORMAL HIGH (ref 26.0–34.0)
MCHC: 33.6 g/dL (ref 30.0–36.0)
MCV: 109.2 fL — ABNORMAL HIGH (ref 80.0–100.0)
Platelets: 246 10*3/uL (ref 150–400)
RBC: 3.49 MIL/uL — ABNORMAL LOW (ref 4.22–5.81)
RDW: 15.6 % — ABNORMAL HIGH (ref 11.5–15.5)
WBC: 5.4 10*3/uL (ref 4.0–10.5)
nRBC: 0 % (ref 0.0–0.2)

## 2022-02-05 LAB — URINALYSIS, MICROSCOPIC (REFLEX)

## 2022-02-05 LAB — URINALYSIS, ROUTINE W REFLEX MICROSCOPIC
Glucose, UA: NEGATIVE mg/dL
Ketones, ur: NEGATIVE mg/dL
Leukocytes,Ua: NEGATIVE
Nitrite: POSITIVE — AB
Protein, ur: 100 mg/dL — AB
Specific Gravity, Urine: 1.025 (ref 1.005–1.030)
pH: 6.5 (ref 5.0–8.0)

## 2022-02-05 LAB — LIPASE, BLOOD: Lipase: 60 U/L — ABNORMAL HIGH (ref 11–51)

## 2022-02-05 MED ORDER — ONDANSETRON 4 MG PO TBDP: 4.0000 mg | ORAL_TABLET | Freq: Three times a day (TID) | ORAL | 0 refills | Status: AC | PRN

## 2022-02-05 MED ORDER — ONDANSETRON HCL 4 MG/2ML IJ SOLN
4.0000 mg | Freq: Once | INTRAMUSCULAR | Status: AC
  Administered 2022-02-05: 4 mg via INTRAVENOUS
  Filled 2022-02-05: qty 2

## 2022-02-05 MED ORDER — CIPROFLOXACIN HCL 500 MG PO TABS: 500.0000 mg | ORAL_TABLET | Freq: Two times a day (BID) | ORAL | 0 refills | Status: DC

## 2022-02-05 MED ORDER — IOHEXOL 350 MG/ML SOLN
75.0000 mL | Freq: Once | INTRAVENOUS | Status: AC | PRN
  Administered 2022-02-05: 75 mL via INTRAVENOUS

## 2022-02-05 MED ORDER — SODIUM CHLORIDE 0.9 % IV BOLUS
1000.0000 mL | Freq: Once | INTRAVENOUS | Status: AC
  Administered 2022-02-05: 1000 mL via INTRAVENOUS

## 2022-02-05 MED ORDER — ONDANSETRON 4 MG PO TBDP
8.0000 mg | ORAL_TABLET | Freq: Once | ORAL | Status: AC
  Administered 2022-02-05: 8 mg via ORAL
  Filled 2022-02-05: qty 2

## 2022-02-05 MED ORDER — CIPROFLOXACIN IN D5W 400 MG/200ML IV SOLN
400.0000 mg | Freq: Once | INTRAVENOUS | Status: AC
  Administered 2022-02-05: 400 mg via INTRAVENOUS
  Filled 2022-02-05: qty 200

## 2022-02-05 NOTE — ED Provider Notes (Signed)
[facility] Emergency Department Provider Note MRN:  [id number]  Arrival date & time: 02/06/22     Chief Complaint   Abdominal Pain   History of Present Illness   Brandon [name] is a 56 y.o. year-old male presents to the ED with chief complaint of nausea and vomiting.  Onset yesterday after work.  Denies any associated diarrhea.  Denies fever, chills, or productive cough.  Denies dysuria or hematuria.  Denies any other associated symptoms.    Hx of HIV followed by [facility]. Hx of mechanical heart valve on Coumadin  History provided by patient.   Review of Systems  Pertinent positive and negative review of systems noted in HPI.    Physical Exam   Vitals:   02/05/22 1855 02/05/22 1955  BP: 129/84 127/80  Pulse: 91 (!) 101  Resp: 16 14  Temp:  99 F (37.2 C)  SpO2: 99% 100%    CONSTITUTIONAL:  non toxic-appearing, NAD NEURO:  Alert and oriented x 3, CN 3-12 grossly intact EYES:  eyes equal and reactive ENT/NECK:  Supple, no stridor  CARDIO:  normal rate, regular rhythm, appears well-perfused  PULM:  No respiratory distress, CTAB GI/GU:  non-distended,  MSK/SPINE:  No gross deformities, no edema, moves all extremities  SKIN:  no rash, atraumatic   *Additional and/or pertinent findings included in MDM below  Diagnostic and Interventional Summary     Labs Reviewed  LIPASE, BLOOD - Abnormal; Notable for the following components:      Result Value   Lipase 60 (*)    All other components within normal limits  COMPREHENSIVE METABOLIC PANEL - Abnormal; Notable for the following components:   Glucose, Bld 105 (*)    Creatinine, Ser 1.49 (*)    Total Bilirubin 1.3 (*)    GFR, Estimated 55 (*)    All other components within normal limits  CBC - Abnormal; Notable for the following components:   RBC 3.49 (*)    Hemoglobin 12.8 (*)    HCT 38.1 (*)    MCV 109.2 (*)    MCH 36.7 (*)    RDW 15.6 (*)    All other components  within normal limits  URINALYSIS, ROUTINE W REFLEX MICROSCOPIC - Abnormal; Notable for the following components:   Hgb urine dipstick LARGE (*)    Bilirubin Urine SMALL (*)    Protein, ur 100 (*)    Nitrite POSITIVE (*)    All other components within normal limits  URINALYSIS, MICROSCOPIC (REFLEX) - Abnormal; Notable for the following components:   Bacteria, UA RARE (*)    All other components within normal limits    CT Abdomen Pelvis W Contrast  Final Result      Medications  ondansetron (ZOFRAN-ODT) disintegrating tablet 8 mg (8 mg Oral Given 02/05/22 0946)  iohexol (OMNIPAQUE) 350 MG/ML injection 75 mL (75 mLs Intravenous Contrast Given 02/05/22 1150)  sodium chloride 0.9 % bolus 1,000 mL (1,000 mLs Intravenous New Bag/Given 02/05/22 2244)  ciprofloxacin (CIPRO) IVPB 400 mg (0 mg Intravenous Stopped 02/05/22 2352)  ondansetron (ZOFRAN) injection 4 mg (4 mg Intravenous Given 02/05/22 2245)     Procedures  /  Critical Care Procedures  ED Course and Medical Decision Making  I have reviewed the triage vital signs, the nursing notes, and pertinent available records from the EMR.  Social Determinants Affecting Complexity of Care: Patient has no clinically significant social determinants affecting this chief complaint..   ED Course:    Medical  Decision Making Patient here with nausea, vomiting, and generalized abdominal pain.  Workup consistent with cystitis based on CT and UA findings.  He is non-toxic.  I don't think patient requires admission or further workup in the ED tonight.    Allergic to cephalosporins, will start on Cipro, which he has taken before.  He will need to follow-up in the Coumadin clinic in 3-5 days.    Amount and/or Complexity of Data Reviewed Labs: ordered.    Details: UA concerning for infection. Lipase mildly elevated, but no evidence of pancreatitis on CT. Radiology: ordered and independent interpretation performed.    Details: No obvious free  air  Risk Prescription drug management.     Consultants: No consultations were needed in caring for this patient.   Treatment and Plan: I considered admission due to patient's initial presentation, but after considering the examination and diagnostic results, patient will not require admission and can be discharged with outpatient follow-up.    Final Clinical Impressions(s) / ED Diagnoses     ICD-10-CM   1. Cystitis  N30.90       ED Discharge Orders          Ordered    ciprofloxacin (CIPRO) 500 MG tablet  2 times daily        02/05/22 2353    ondansetron (ZOFRAN-ODT) 4 MG disintegrating tablet  Every 8 hours PRN        02/05/22 2353              Discharge Instructions Discussed with and Provided to Patient:     Discharge Instructions      You need to have your Coumadin level checked in 3-5 days.  The antibiotic can affect the Coumadin's effectiveness.   If your symptoms change or worsen, please return to the ER.       Montine Circle, PA-C 02/06/22 0004    Lennice Sites, DO 02/06/22 1605

## 2022-02-05 NOTE — Discharge Instructions (Signed)
You need to have your Coumadin level checked in 3-5 days.  The antibiotic can affect the Coumadin's effectiveness.   If your symptoms change or worsen, please return to the ER.

## 2022-02-05 NOTE — ED Triage Notes (Signed)
Pt states he has had n/v/abd since yesterday. Pt also complains some numbness in his left arm that started yesterday/ intermittently numb. Unable to recall what time it started.

## 2022-02-05 NOTE — ED Provider Triage Note (Signed)
Emergency Medicine Provider Triage Evaluation Note  Brandon [name] , a 56 y.o. male  was evaluated in triage.  Pt complains of generalized abdominal pain, nausea, vomiting which began yesterday afternoon at approximately 6 PM.  Patient states he got off work yesterday at 1:00 in the afternoon and had a can of soup.  He states that shortly thereafter he began having fairly frequent episodes of emesis.  He denies diarrhea.  Patient states that his doctor has recommended he come for fluids.  The patient has history of cholecystectomy approximately 3 years ago.  Patient denies alcohol use.  Review of Systems  Positive: As above Negative: As above  Physical Exam  BP (!) 130/91   Pulse 94   Temp 98.9 F (37.2 C)   Resp 12   SpO2 100%  Gen:   Awake, no distress   Resp:  Normal effort  MSK:   Moves extremities without difficulty  Other:    Medical Decision Making  Medically screening exam initiated at 9:40 AM.  Appropriate orders placed.  Brandon [name] was informed that the remainder of the evaluation will be completed by another provider, this initial triage assessment does not replace that evaluation, and the importance of remaining in the ED until their evaluation is complete.     Dorothyann Peng, [place] 02/05/22 [phone]

## 2022-02-06 NOTE — ED Notes (Signed)
Patient verbalizes understanding of discharge instructions. Opportunity for questioning and answers were provided. Armband removed by staff, pt discharged from ED. Ambulated out to lobby with wife  

## 2022-02-12 ENCOUNTER — Other Ambulatory Visit (HOSPITAL_COMMUNITY): Payer: Self-pay

## 2022-02-12 NOTE — Telephone Encounter (Signed)
Brandon [name] checked patient's eligibility, Brandon [name] is covered with a $0 copay and no PA needed.   Beryle Flock, RN

## 2022-02-26 NOTE — Progress Notes (Unsigned)
NEUROLOGY CONSULTATION NOTE  Brandon [name] MRN: [id number] DOB: 1965/11/08  Referring provider: Dannebrog Cellar, MD Primary care provider: Thalia Party  Reason for consult:  schwannoma  Assessment/Plan:   Chronic nausea and vomiting - unknown etiology but does not appear to be neurologic Left vestibular schwannoma  History of stroke/left PCA territory Hypertension Hyperlipidemia   Management of schwannoma as per ENT Secondary stroke management as per PCP: Antithrombotic therapy - already on chronic warfarin due to history of DVT Statin.  LDL goal less than 70 Hgb A1c goal less than 7 Normotensive blood pressure - follow up with PCP Mediterranean diet Routine exercise Follow up as needed   Subjective:  Brandon [name] is a 56 year old male with HTN, HIV, IBS, gastric AVM, chronic warfarin secondary to aortic valve replacement and history of stroke (2008) who presents for schwannoma.  History supplemented by GI and primary care notes.  He has had chronic nausea and abdominal pain with intermittent vomiting for several years.  Work up by gastroenterology was unrevealing.  They ordered an MRI of the brain with and without contrast performed on 09/18/2021 which was personally reviewed and revealed 4 mm vestibular schwannoma within the left internal auditory canal as well as mild chronic small vessel ischemic changes and chronic infarct within the medial and posteroinferior left temporal lobe and portions of the left hippocampus.  He has a history of stroke.  He saw ENT for the schwannoma.  Audiometric testing was normal, which was not expected, so he was referred to [facility] for second opinion regarding treatment of monitoring.       PAST MEDICAL HISTORY: Past Medical History:  Diagnosis Date   Abdominal pain    Anemia    Aortic atherosclerosis (HCC)    Arthritis    Back pain 02/13/2016   Constipation    Depression    Diarrhea    Diverticulosis    Family  history of CVA 06/27/2021   Foot lesion 12/19/2014   Gallstones    Gastric AVM    GERD (gastroesophageal reflux disease)    GI bleed    HIV (human immunodeficiency virus infection) ([place])    Hypertension    IBS (irritable bowel syndrome)    Infectious colitis    Internal hemorrhoids    Interstitial cystitis    Lymphopenia 05/31/2020   Mechanical heart valve present    Nausea & vomiting    Osteopenia 06/02/2018   Pancreatitis    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   Stroke ([place])    Weight loss, unintentional     PAST SURGICAL HISTORY: Past Surgical History:  Procedure Laterality Date   AORTIC VALVE REPLACEMENT     CARDIAC SURGERY     CHOLECYSTECTOMY  02/12/2012   Procedure: LAPAROSCOPIC CHOLECYSTECTOMY;  Surgeon: Stark Klein, MD;  [facility];  Service: General;  Laterality: N/A;   COLONOSCOPY WITH ESOPHAGOGASTRODUODENOSCOPY (EGD)     with polypectomy   ESOPHAGOGASTRODUODENOSCOPY N/A 07/24/2012   Procedure: ESOPHAGOGASTRODUODENOSCOPY (EGD);  Surgeon: Beryle Beams, MD;  Location: [facility] ENDOSCOPY;  Service: Endoscopy;  Laterality: N/A;   KNEE SURGERY     MULTIPLE EXTRACTIONS WITH ALVEOLOPLASTY N/A 03/13/2018   Procedure: MULTIPLE EXTRACTION;  Surgeon: Diona Browner, DDS;  Location: [place];  Service: Oral Surgery;  Laterality: N/A;    MEDICATIONS: Current Outpatient Medications on File Prior to Visit  Medication Sig Dispense Refill   ARIPiprazole (ABILIFY) 5 MG tablet Take 1 tablet by mouth at bedtime.  ciprofloxacin (CIPRO) 500 MG tablet Take 1 tablet (500 mg total) by mouth 2 (two) times daily. 14 tablet 0   colestipol (COLESTID) 1 g tablet Take 1 tablet (1 g total) by mouth 2 (two) times daily. 30 tablet 0   dolutegravir (TIVICAY) 50 MG tablet Take 1 tablet (50 mg total) by mouth 2 (two) times daily. 60 tablet 11   emtricitabine-tenofovir AF (DESCOVY) 200-25 MG tablet Take 1 tablet by mouth daily. 30 tablet 11   EPINEPHrine (EPI-PEN) 0.3 mg/0.3 mL DEVI  Inject 0.3 mLs (0.3 mg total) into the muscle once. 1 Device 1   escitalopram (LEXAPRO) 20 MG tablet TAKE 1 TABLET BY MOUTH   DAILY (Patient taking differently: Take 20 mg by mouth daily.) 30 tablet 1   fostemsavir tromethamine (RUKOBIA) 600 MG TB12 ER tablet Take 1 tablet by mouth every 12 (twelve) hours. 60 tablet 11   furosemide (LASIX) 20 MG tablet Please take one tablet 20 mg by mouth daily as needed for shortness of breath.     LORazepam (ATIVAN) 1 MG tablet Take 1 mg by mouth at bedtime as needed for anxiety or sleep.     losartan (COZAAR) 25 MG tablet Take 25 mg by mouth daily.     metoprolol succinate (TOPROL-XL) 25 MG 24 hr tablet Take 25 mg by mouth daily.     ondansetron (ZOFRAN) 8 MG tablet Take 1 tablet (8 mg total) by mouth every 4 (four) hours as needed for nausea. 10 tablet 0   ondansetron (ZOFRAN-ODT) 4 MG disintegrating tablet Take 1 tablet (4 mg total) by mouth every 8 (eight) hours as needed for nausea or vomiting. 10 tablet 0   Opium 10 MG/ML (1%) TINC Take 0.6 mLs (6 mg total) by mouth every 6 (six) hours. 72 mL 0   polyethylene glycol powder (GLYCOLAX/MIRALAX) 17 GM/SCOOP powder Take 17 g by mouth daily.     PROMETHEGAN 25 MG suppository Place 1 suppository (25 mg total) rectally every 6 (six) hours as needed for nausea or vomiting. 12 each 0   rosuvastatin (CRESTOR) 20 MG tablet Take 1 tablet (20 mg total) by mouth daily. 30 tablet 11   traZODone (DESYREL) 50 MG tablet Take 50 mg by mouth at bedtime.      valACYclovir (VALTREX) 1000 MG tablet Take 1 tablet (1,000 mg total) by mouth daily. 30 tablet 11   warfarin (COUMADIN) 1 MG tablet Take 1 mg by mouth every Friday. Take as directed with '5mg'$  tablet per [facility] on Fridays.     warfarin (COUMADIN) 4 MG tablet Take by mouth See admin instructions. Takes '5MG'$  and '4MG'$  together ('9MG'$  total) on Sundays and Tuesdays     warfarin (COUMADIN) 5 MG tablet Take 5 mg by mouth See admin instructions. Takes 5 MG on Monday, Wednesday,  Thursday, Friday and Saturday, then 9 MG on Sunday and Tuesday     zidovudine (RETROVIR) 300 MG tablet Take 1 tablet (300 mg total) by mouth 2 (two) times daily. 60 tablet 11   Current Facility-Administered Medications on File Prior to Visit  Medication Dose Route Frequency Provider Last Rate Last Admin   0.9 %  sodium chloride infusion   Intravenous Once Campbell Riches, MD        ALLERGIES: Allergies  Allergen Reactions   Bactrim [Sulfamethoxazole-Trimethoprim]    Bee Venom Anaphylaxis   Sulfa Antibiotics Anaphylaxis   Truvada [Emtricitabine-Tenofovir Df] Anaphylaxis and Rash    Takes plain tenofovir at home   Lidoderm [Lidocaine] Other (  See Comments)    Reaction unknown   Raltegravir     resistance   Ceftriaxone Rash   Sulfamethoxazole Itching, Other (See Comments) and Rash    Other reaction(s): Hypotension (ALLERGY/intolerance)    FAMILY HISTORY: Family History  Problem Relation Age of Onset   Hypertension Father    Prostate cancer Father    Stomach cancer Father    Hypertension Sister    Diabetes Maternal Aunt    Cancer - Other Cousin    Parkinson's disease Paternal Aunt     Objective:  Blood pressure (!) 162/70, pulse (!) 124, height '5\' 9"'$  (1.753 m), weight 127 lb 12.8 oz (58 kg), SpO2 98 %. General: No acute distress.  Patient appears well-groomed.   Head:  Normocephalic/atraumatic Eyes:  fundi examined but not visualized Neck: supple, no paraspinal tenderness, full range of motion Back: No paraspinal tenderness Heart: regular rate and rhythm Lungs: Clear to auscultation bilaterally. Vascular: No carotid bruits. Neurological Exam: Mental status: alert and oriented to person, place, and time, speech fluent and not dysarthric, language intact. Cranial nerves: CN I: not tested CN II: pupils equal, round and reactive to light, visual fields intact CN III, IV, VI:  full range of motion, no nystagmus, no ptosis CN V: facial sensation intact. CN VII: upper and  lower face symmetric CN VIII: hearing intact CN IX, X: gag intact, uvula midline CN XI: sternocleidomastoid and trapezius muscles intact CN XII: tongue midline Bulk & Tone: normal, no fasciculations. Motor:  muscle strength 5/5 throughout Sensation:  Pinprick, temperature and vibratory sensation intact. Deep Tendon Reflexes:  2+ throughout,  toes downgoing.   Finger to nose testing:  Without dysmetria.   Heel to shin:  Without dysmetria.   Gait:  Normal station and stride.  Romberg negative.    Thank you for allowing me to take part in the care of this patient.  Metta Clines, DO  CC:  Bayard Beaver, MD

## 2022-02-27 ENCOUNTER — Ambulatory Visit (INDEPENDENT_AMBULATORY_CARE_PROVIDER_SITE_OTHER): Payer: 59 | Admitting: Neurology

## 2022-02-27 ENCOUNTER — Encounter: Payer: Self-pay | Admitting: Neurology

## 2022-02-27 VITALS — BP 162/70 | HR 124 | Ht 69.0 in | Wt 127.8 lb

## 2022-02-27 DIAGNOSIS — Z8673 Personal history of transient ischemic attack (TIA), and cerebral infarction without residual deficits: Secondary | ICD-10-CM | POA: Diagnosis not present

## 2022-02-27 DIAGNOSIS — D333 Benign neoplasm of cranial nerves: Secondary | ICD-10-CM

## 2022-02-27 DIAGNOSIS — I10 Essential (primary) hypertension: Secondary | ICD-10-CM | POA: Diagnosis not present

## 2022-02-27 DIAGNOSIS — R110 Nausea: Secondary | ICD-10-CM | POA: Diagnosis not present

## 2022-02-27 NOTE — Patient Instructions (Signed)
I have no explanation for the nausea and vomiting from a neurologic perspective Follow up with ENT as directed Follow up with me as needed

## 2022-04-01 ENCOUNTER — Encounter: Payer: Self-pay | Admitting: Infectious Disease

## 2022-04-01 ENCOUNTER — Ambulatory Visit (INDEPENDENT_AMBULATORY_CARE_PROVIDER_SITE_OTHER): Payer: 59 | Admitting: Infectious Disease

## 2022-04-01 ENCOUNTER — Other Ambulatory Visit (HOSPITAL_COMMUNITY)
Admission: RE | Admit: 2022-04-01 | Discharge: 2022-04-01 | Disposition: A | Discharge status: Home / Self Care | Payer: 59 | Source: Ambulatory Visit | Attending: Infectious Disease | Admitting: Infectious Disease

## 2022-04-01 ENCOUNTER — Other Ambulatory Visit: Payer: Self-pay

## 2022-04-01 VITALS — BP 128/77 | HR 97 | Temp 98.4°F | Wt 128.0 lb

## 2022-04-01 DIAGNOSIS — R1084 Generalized abdominal pain: Secondary | ICD-10-CM

## 2022-04-01 DIAGNOSIS — B20 Human immunodeficiency virus [HIV] disease: Secondary | ICD-10-CM | POA: Insufficient documentation

## 2022-04-01 DIAGNOSIS — F339 Major depressive disorder, recurrent, unspecified: Secondary | ICD-10-CM | POA: Diagnosis not present

## 2022-04-01 DIAGNOSIS — M545 Low back pain, unspecified: Secondary | ICD-10-CM | POA: Insufficient documentation

## 2022-04-01 DIAGNOSIS — Z7185 Encounter for immunization safety counseling: Secondary | ICD-10-CM | POA: Insufficient documentation

## 2022-04-01 DIAGNOSIS — Z952 Presence of prosthetic heart valve: Secondary | ICD-10-CM

## 2022-04-01 MED ORDER — ROSUVASTATIN CALCIUM 20 MG PO TABS: 20.0000 mg | ORAL_TABLET | Freq: Every day | ORAL | 11 refills | Status: DC

## 2022-04-01 MED ORDER — PROMETHAZINE HCL 25 MG PO TABS: ORAL_TABLET | ORAL | 3 refills | Status: AC

## 2022-04-01 MED ORDER — RUKOBIA 600 MG PO TB12: ORAL_TABLET | ORAL | 11 refills | Status: DC

## 2022-04-01 MED ORDER — TIVICAY 50 MG PO TABS: 50.0000 mg | ORAL_TABLET | Freq: Two times a day (BID) | ORAL | 11 refills | Status: DC

## 2022-04-01 MED ORDER — DESCOVY 200-25 MG PO TABS: 1.0000 | ORAL_TABLET | Freq: Every day | ORAL | 11 refills | Status: DC

## 2022-04-01 MED ORDER — ZIDOVUDINE 300 MG PO TABS: 300.0000 mg | ORAL_TABLET | Freq: Two times a day (BID) | ORAL | 11 refills | Status: DC

## 2022-04-01 NOTE — Progress Notes (Signed)
Subjective:   Chief complaint: back pain and abdominal pain with episodes of vomiting this weekend.    Patient ID: Brandon [name], male    DOB: 1965/08/08, 57 y.o.   MRN: [id number]  HPI   57 year old Brandon [name] is a highly complicated man with history of  HIV/AIDS and Multi-DRUG RESISTANT virus formerly followed at [facility]. His HIV nadir was  20 when we first met him     He had been on various complicated antiretroviral regimens in the past, with unfortunate GENOTYPIC resistance to all non-nucleoside reverse transcriptase inhibitors and all NRTIs, Resistance to all protease inhibitors with the exception of Prezista which had some activity genotypically,, Resistance to Isentress, and Elvitegravir and  reduced S to dolutegravir having both a 148H and 140S  and with Dual tropic virus.   02/12/2010 phenotype at [facility] showed:  RT: NRTI: ABC, DDI, D4T, AZT, TDF: resistant; 3TC, FTC: susceptible; NNRTI: EFV susceptible; RPV, NVP, ETR, DLV: Resistant; PI: pan-resistant   He then decided to go back onto [facility] and WAS  referred to [facility].   We  Had  seen him and placed him on a  salvage regimen of Prezista '600mg'$   Twice daily boosted with Norvir '100mg'$  twice daily, Tivicay twice daily, Combivir twice daily and once daily Viread.   And since then he haD BEEN WITH AN UNDETECTABLE VIRAL LOAD <20 FOR MORE THAN  four  YEARS  And  Healthy CD4 count.   Did have one time where his viral load popped into the thousands but there was no resistance seen and he was able to resuppress on :  --Tivicay '50mg'$  BID --Prezista '600mg'$  BID with  --Norvir '100mg'$  BID -AZT  BID --DESCOVY q daily   He continued  to have problems with nausea and vomiting that he and I  attributed to DRV boosted w RTV   We subsequently switched off DRV/RTV BID for Fostemsavir BID along with continuing the remainder of his salvage regimen.  His nausea had dramatically improved and he states that he does not vomit nearly as  frequently as he did before on the prior regimen.   I offered idea of swapping in injectable LEN for Rukobia but Kage really dislikes needles so that does not appeal to him.  His viral load had jumped over 260,000.    We brought him back literally 3 days later and his viral load was 6920 and resistance testing showed some resistance but not the amount of resistance we know that he has.   Viral load came down in the ensuing check.  He again has had some vomiting this weekend with epigastric pain and some back pain that he feels is not related to this.  He tells me these been diagnosed with chronic pancreatitis by the ER though I do not know if he really does have chronic pancreatitis I think maybe the cyclic vomiting syndrome may be more of an appropriate fit for him.    Lab Results  Component Value Date   HIV1RNAQUANT 138 (H) 01/30/2022     Says he is back taking the medications and continues to suffer from chronic nausea.  Vomiting and nausea/ Dr Carlean Purl previously suggest the idea of cyclic vomiting associated marijuana use.  Marsel does not feel that marijuana use is made any difference when he has not been on marijuana he does smoke daily.      Past Medical History:  Diagnosis Date   Abdominal pain  Anemia    Aortic atherosclerosis (HCC)    Arthritis    Back pain 02/13/2016   Constipation    Depression    Diarrhea    Diverticulosis    Family history of CVA 06/27/2021   Foot lesion 12/19/2014   Gallstones    Gastric AVM    GERD (gastroesophageal reflux disease)    GI bleed    HIV (human immunodeficiency virus infection) ([place])    Hypertension    IBS (irritable bowel syndrome)    Infectious colitis    Internal hemorrhoids    Interstitial cystitis    Lymphopenia 05/31/2020   Mechanical heart valve present    Nausea & vomiting    Osteopenia 06/02/2018   Pancreatitis    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   Stroke ([place])    Weight  loss, unintentional     Past Surgical History:  Procedure Laterality Date   AORTIC VALVE REPLACEMENT     CARDIAC SURGERY     CHOLECYSTECTOMY  02/12/2012   Procedure: LAPAROSCOPIC CHOLECYSTECTOMY;  Surgeon: Stark Klein, MD;  Location: [facility] OR;  Service: General;  Laterality: N/A;   COLONOSCOPY WITH ESOPHAGOGASTRODUODENOSCOPY (EGD)     with polypectomy   ESOPHAGOGASTRODUODENOSCOPY N/A 07/24/2012   Procedure: ESOPHAGOGASTRODUODENOSCOPY (EGD);  Surgeon: Beryle Beams, MD;  Location: [facility] ENDOSCOPY;  Service: Endoscopy;  Laterality: N/A;   KNEE SURGERY     MULTIPLE EXTRACTIONS WITH ALVEOLOPLASTY N/A 03/13/2018   Procedure: MULTIPLE EXTRACTION;  Surgeon: Diona Browner, DDS;  Location: [place];  Service: Oral Surgery;  Laterality: N/A;    Family History  Problem Relation Age of Onset   Hypertension Father    Prostate cancer Father    Stomach cancer Father    Hypertension Sister    Diabetes Maternal Aunt    Parkinson's disease Paternal Aunt    Dementia Paternal Uncle    Dementia Paternal Uncle    Migraines Paternal Grandmother    Cancer - Other Cousin       Social History   Socioeconomic History   Marital status: Married    Spouse name: Not on file   Number of children: 6   Years of education: Not on file   Highest education level: Not on file  Occupational History   Occupation: disability rep  Tobacco Use   Smoking status: Former    Packs/day: 0.10    Years: 20.00    Total pack years: 2.00    Types: Cigars, Cigarettes    Quit date: 08/02/2017    Years since quitting: 4.6   Smokeless tobacco: Never  Vaping Use   Vaping Use: Some days   Substances: CBD   Devices: CBD vaping  Substance and Sexual Activity   Alcohol use: Yes    Alcohol/week: 0.0 standard drinks of alcohol    Comment: rarely    Drug use: Yes    Frequency: 7.0 times per week    Types: Marijuana   Sexual activity: Yes    Partners: Female    Birth control/protection: Condom    Comment: declined condoms   Other Topics Concern   Not on file  Social History Narrative   Are you right handed or left handed? Right    Are you currently employed ? 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 NO   Who lives with you?    What type of home do you live in: 1 story or 2 story? 1  Social Determinants of Health   Financial Resource Strain: Not on file  Food Insecurity: Not on file  Transportation Needs: Not on file  Physical Activity: Not on file  Stress: Not on file  Social Connections: Not on file    Allergies  Allergen Reactions   Bactrim [Sulfamethoxazole-Trimethoprim]    Bee Venom Anaphylaxis   Sulfa Antibiotics Anaphylaxis   Truvada [Emtricitabine-Tenofovir Df] Anaphylaxis and Rash    Takes plain tenofovir at home   Lidoderm [Lidocaine] Other (See Comments)    Reaction unknown   Raltegravir     resistance   Ceftriaxone Rash   Sulfamethoxazole Itching, Other (See Comments) and Rash    Other reaction(s): Hypotension (ALLERGY/intolerance)     Current Outpatient Medications:    ARIPiprazole (ABILIFY) 5 MG tablet, Take 1 tablet by mouth at bedtime., Disp: , Rfl:    colestipol (COLESTID) 1 g tablet, Take 1 tablet (1 g total) by mouth 2 (two) times daily., Disp: 30 tablet, Rfl: 0   dolutegravir (TIVICAY) 50 MG tablet, Take 1 tablet (50 mg total) by mouth 2 (two) times daily., Disp: 60 tablet, Rfl: 11   emtricitabine-tenofovir AF (DESCOVY) 200-25 MG tablet, Take 1 tablet by mouth daily., Disp: 30 tablet, Rfl: 11   EPINEPHrine (EPI-PEN) 0.3 mg/0.3 mL DEVI, Inject 0.3 mLs (0.3 mg total) into the muscle once., Disp: 1 Device, Rfl: 1   escitalopram (LEXAPRO) 20 MG tablet, TAKE 1 TABLET BY MOUTH   DAILY (Patient taking differently: Take 20 mg by mouth daily.), Disp: 30 tablet, Rfl: 1   fostemsavir tromethamine (RUKOBIA) 600 MG TB12 ER tablet, Take 1 tablet by mouth every 12 (twelve) hours., Disp: 60 tablet, Rfl: 11   furosemide (LASIX) 20 MG  tablet, Please take one tablet 20 mg by mouth daily as needed for shortness of breath., Disp: , Rfl:    LORazepam (ATIVAN) 1 MG tablet, Take 1 mg by mouth at bedtime as needed for anxiety or sleep., Disp: , Rfl:    losartan (COZAAR) 25 MG tablet, Take 25 mg by mouth daily., Disp: , Rfl:    metoprolol succinate (TOPROL-XL) 25 MG 24 hr tablet, Take 25 mg by mouth daily., Disp: , Rfl:    ondansetron (ZOFRAN) 8 MG tablet, Take 1 tablet (8 mg total) by mouth every 4 (four) hours as needed for nausea., Disp: 10 tablet, Rfl: 0   ondansetron (ZOFRAN-ODT) 4 MG disintegrating tablet, Take 1 tablet (4 mg total) by mouth every 8 (eight) hours as needed for nausea or vomiting., Disp: 10 tablet, Rfl: 0   Opium 10 MG/ML (1%) TINC, Take 0.6 mLs (6 mg total) by mouth every 6 (six) hours., Disp: 72 mL, Rfl: 0   polyethylene glycol powder (GLYCOLAX/MIRALAX) 17 GM/SCOOP powder, Take 17 g by mouth daily., Disp: , Rfl:    PROMETHEGAN 25 MG suppository, Place 1 suppository (25 mg total) rectally every 6 (six) hours as needed for nausea or vomiting., Disp: 12 each, Rfl: 0   rosuvastatin (CRESTOR) 20 MG tablet, Take 1 tablet (20 mg total) by mouth daily., Disp: 30 tablet, Rfl: 11   sucralfate (CARAFATE) 1 g tablet, Take 1 g by mouth 3 (three) times daily., Disp: , Rfl:    traZODone (DESYREL) 50 MG tablet, Take 50 mg by mouth at bedtime. , Disp: , Rfl:    valACYclovir (VALTREX) 1000 MG tablet, Take 1 tablet (1,000 mg total) by mouth daily., Disp: 30 tablet, Rfl: 11   warfarin (COUMADIN) 1 MG tablet, Take 1 mg by mouth every  Friday. Take as directed with '5mg'$  tablet per [facility] on Fridays., Disp: , Rfl:    warfarin (COUMADIN) 4 MG tablet, Take by mouth See admin instructions. Takes '5MG'$  and '4MG'$  together ('9MG'$  total) on Sundays and Tuesdays, Disp: , Rfl:    warfarin (COUMADIN) 5 MG tablet, Take 5 mg by mouth See admin instructions. Takes 5 MG on Monday, Wednesday, Thursday, Friday and Saturday, then 9 MG on Sunday and  Tuesday, Disp: , Rfl:    zidovudine (RETROVIR) 300 MG tablet, Take 1 tablet (300 mg total) by mouth 2 (two) times daily., Disp: 60 tablet, Rfl: 11 No current facility-administered medications for this visit.  Facility-Administered Medications Ordered in Other Visits:    0.9 %  sodium chloride infusion, , Intravenous, Once, Campbell Riches, MD  Review of Systems  Constitutional:  Negative for activity change, appetite change, chills, diaphoresis, fatigue, fever and unexpected weight change.  HENT:  Negative for congestion, rhinorrhea, sinus pressure, sneezing, sore throat and trouble swallowing.   Eyes:  Negative for photophobia and visual disturbance.  Respiratory:  Negative for cough, chest tightness, shortness of breath, wheezing and stridor.   Cardiovascular:  Negative for chest pain, palpitations and leg swelling.  Gastrointestinal:  Positive for abdominal pain, nausea and vomiting. Negative for abdominal distention, anal bleeding, blood in stool, constipation and diarrhea.  Genitourinary:  Negative for difficulty urinating, dysuria, flank pain and hematuria.  Musculoskeletal:  Negative for arthralgias, back pain, gait problem, joint swelling and myalgias.  Skin:  Negative for color change, pallor, rash and wound.  Neurological:  Negative for dizziness, tremors, weakness and light-headedness.  Hematological:  Negative for adenopathy. Does not bruise/bleed easily.  Psychiatric/Behavioral:  Negative for agitation, behavioral problems, confusion, decreased concentration, dysphoric mood and sleep disturbance.        Objective:   Physical Exam Constitutional:      General: He is not in acute distress.    Appearance: Normal appearance. He is well-developed. He is not ill-appearing or diaphoretic.  HENT:     Head: Normocephalic and atraumatic.     Right Ear: Hearing and external ear normal.     Left Ear: Hearing and external ear normal.     Nose: No nasal deformity or rhinorrhea.   Eyes:     General: No scleral icterus.    Conjunctiva/sclera: Conjunctivae normal.     Right eye: Right conjunctiva is not injected.     Left eye: Left conjunctiva is not injected.     Pupils: Pupils are equal, round, and reactive to light.  Neck:     Vascular: No JVD.  Cardiovascular:     Rate and Rhythm: Normal rate and regular rhythm.     Heart sounds: S1 normal and S2 normal.  Pulmonary:     Effort: Pulmonary effort is normal. No respiratory distress.     Breath sounds: No wheezing.  Abdominal:     General: Bowel sounds are decreased. There is no distension.     Palpations: Abdomen is soft.     Tenderness: There is abdominal tenderness in the epigastric area. There is no guarding.  Musculoskeletal:        General: Normal range of motion.     Right shoulder: Normal.     Left shoulder: Normal.     Cervical back: Normal range of motion and neck supple.     Right hip: Normal.     Left hip: Normal.     Right knee: Normal.     Left knee:  Normal.  Lymphadenopathy:     Head:     Right side of head: No submandibular, preauricular or posterior auricular adenopathy.     Left side of head: No submandibular, preauricular or posterior auricular adenopathy.     Cervical: No cervical adenopathy.     Right cervical: No superficial or deep cervical adenopathy.    Left cervical: No superficial or deep cervical adenopathy.  Skin:    General: Skin is warm and dry.     Coloration: Skin is not pale.     Findings: No abrasion, bruising, ecchymosis, erythema, lesion or rash.     Nails: There is no clubbing.  Neurological:     Mental Status: He is alert and oriented to person, place, and time.     Sensory: No sensory deficit.     Coordination: Coordination normal.     Gait: Gait normal.  Psychiatric:        Attention and Perception: He is attentive.        Speech: Speech normal.        Behavior: Behavior normal. Behavior is cooperative.        Thought Content: Thought content normal.         Judgment: Judgment normal.           Assessment & Plan:   HIV disease:  We will continue his [place] twice daily TIVICAY twice daily AZT twice daily and once daily DESCOVY  Check a viral load with reflex to genotype including integrase strand transfer inhibitors  Nausea vomiting abdominal pain I will check a lipase as well as liver function test today.  I have also written a prescription for 25 mg of Phenergan tablets and asked him to try it these at a dose of 25 or even 50 mg when his nausea begins to escalate in the face of his daily maintenance Zofran.  He should refrain from driving however after taking the Phenergan  Hyperlipidemia continue Crestor   Aortic valve replacement on Coumadin     Vaccine counseling: up to date on his vaccines

## 2022-04-02 LAB — T-HELPER CELLS (CD4) COUNT (NOT AT ARMC)
CD4 % Helper T Cell: 23 % — ABNORMAL LOW (ref 33–65)
CD4 T Cell Abs: 545 /uL (ref 400–1790)

## 2022-04-03 LAB — URINE CYTOLOGY ANCILLARY ONLY
Chlamydia: NEGATIVE
Comment: NEGATIVE
Comment: NORMAL
Neisseria Gonorrhea: NEGATIVE

## 2022-04-06 LAB — CBC WITH DIFFERENTIAL/PLATELET
Absolute Monocytes: 453 cells/uL (ref 200–950)
Basophils Absolute: 37 cells/uL (ref 0–200)
Basophils Relative: 0.6 %
Eosinophils Absolute: 31 cells/uL (ref 15–500)
Eosinophils Relative: 0.5 %
HCT: 35.2 % — ABNORMAL LOW (ref 38.5–50.0)
Hemoglobin: 12.3 g/dL — ABNORMAL LOW (ref 13.2–17.1)
Lymphs Abs: 2511 cells/uL (ref 850–3900)
MCH: 36.7 pg — ABNORMAL HIGH (ref 27.0–33.0)
MCHC: 34.9 g/dL (ref 32.0–36.0)
MCV: 105.1 fL — ABNORMAL HIGH (ref 80.0–100.0)
MPV: 11.7 fL (ref 7.5–12.5)
Monocytes Relative: 7.3 %
Neutro Abs: 3168 cells/uL (ref 1500–7800)
Neutrophils Relative %: 51.1 %
Platelets: 224 10*3/uL (ref 140–400)
RBC: 3.35 10*6/uL — ABNORMAL LOW (ref 4.20–5.80)
RDW: 15.3 % — ABNORMAL HIGH (ref 11.0–15.0)
Total Lymphocyte: 40.5 %
WBC: 6.2 10*3/uL (ref 3.8–10.8)

## 2022-04-06 LAB — COMPLETE METABOLIC PANEL WITH GFR
AG Ratio: 1.6 (calc) (ref 1.0–2.5)
ALT: 19 U/L (ref 9–46)
AST: 28 U/L (ref 10–35)
Albumin: 4.4 g/dL (ref 3.6–5.1)
Alkaline phosphatase (APISO): 90 U/L (ref 35–144)
BUN/Creatinine Ratio: 21 (calc) (ref 6–22)
BUN: 28 mg/dL — ABNORMAL HIGH (ref 7–25)
CO2: 29 mmol/L (ref 20–32)
Calcium: 9.4 mg/dL (ref 8.6–10.3)
Chloride: 103 mmol/L (ref 98–110)
Creat: 1.33 mg/dL — ABNORMAL HIGH (ref 0.70–1.30)
Globulin: 2.8 g/dL (calc) (ref 1.9–3.7)
Glucose, Bld: 84 mg/dL (ref 65–99)
Potassium: 3.5 mmol/L (ref 3.5–5.3)
Sodium: 140 mmol/L (ref 135–146)
Total Bilirubin: 0.8 mg/dL (ref 0.2–1.2)
Total Protein: 7.2 g/dL (ref 6.1–8.1)
eGFR: 64 mL/min/{1.73_m2} (ref >=60)

## 2022-04-06 LAB — LIPID PANEL
Cholesterol: 144 mg/dL (ref <200)
HDL: 56 mg/dL (ref >=40)
LDL Cholesterol (Calc): 64 mg/dL (calc)
Non-HDL Cholesterol (Calc): 88 mg/dL (calc) (ref <130)
Total CHOL/HDL Ratio: 2.6 (calc) (ref <5.0)
Triglycerides: 158 mg/dL — ABNORMAL HIGH (ref <150)

## 2022-04-06 LAB — LIPASE: Lipase: 37 U/L (ref 7–60)

## 2022-04-06 LAB — RPR: RPR Ser Ql: NONREACTIVE

## 2022-04-06 LAB — HIV RNA, RTPCR W/R GT (RTI, PI,INT)
HIV 1 RNA Quant: NOT DETECTED copies/mL
HIV-1 RNA Quant, Log: NOT DETECTED Log copies/mL

## 2022-05-15 ENCOUNTER — Telehealth: Payer: Self-pay

## 2022-05-15 ENCOUNTER — Other Ambulatory Visit (HOSPITAL_COMMUNITY): Payer: Self-pay

## 2022-05-15 ENCOUNTER — Other Ambulatory Visit: Payer: Self-pay

## 2022-05-15 DIAGNOSIS — B20 Human immunodeficiency virus [HIV] disease: Secondary | ICD-10-CM

## 2022-05-15 MED ORDER — RUKOBIA 600 MG PO TB12
ORAL_TABLET | ORAL | 11 refills | Status: DC
  Filled 2022-05-15: qty 60, fill #0
  Filled 2022-05-16: qty 60, 30d supply, fill #0

## 2022-05-15 NOTE — Telephone Encounter (Signed)
Patient called stating that [facility] couldn't order Marny Lowenstein (confirmed with pharmacy). Per [facility] pharmacy team - RX can be sent to [facility] and ordered for patient. RX sent.  [place], CMA

## 2022-05-16 ENCOUNTER — Other Ambulatory Visit (HOSPITAL_COMMUNITY): Payer: Self-pay

## 2022-05-27 ENCOUNTER — Other Ambulatory Visit: Payer: Self-pay | Admitting: Infectious Disease

## 2022-06-05 ENCOUNTER — Other Ambulatory Visit: Payer: Self-pay

## 2022-06-05 ENCOUNTER — Encounter: Payer: Self-pay | Admitting: Infectious Disease

## 2022-06-05 ENCOUNTER — Ambulatory Visit (INDEPENDENT_AMBULATORY_CARE_PROVIDER_SITE_OTHER): Payer: 59 | Admitting: Infectious Disease

## 2022-06-05 ENCOUNTER — Other Ambulatory Visit (HOSPITAL_COMMUNITY): Payer: Self-pay

## 2022-06-05 ENCOUNTER — Other Ambulatory Visit (HOSPITAL_COMMUNITY)
Admission: RE | Admit: 2022-06-05 | Discharge: 2022-06-05 | Disposition: A | Discharge status: Home / Self Care | Payer: 59 | Source: Ambulatory Visit | Attending: Infectious Disease | Admitting: Infectious Disease

## 2022-06-05 VITALS — BP 135/78 | HR 94 | Resp 16 | Ht 69.0 in | Wt 134.0 lb

## 2022-06-05 DIAGNOSIS — Z7901 Long term (current) use of anticoagulants: Secondary | ICD-10-CM | POA: Diagnosis present

## 2022-06-05 DIAGNOSIS — B20 Human immunodeficiency virus [HIV] disease: Secondary | ICD-10-CM | POA: Diagnosis present

## 2022-06-05 DIAGNOSIS — Z952 Presence of prosthetic heart valve: Secondary | ICD-10-CM

## 2022-06-05 DIAGNOSIS — Z7185 Encounter for immunization safety counseling: Secondary | ICD-10-CM | POA: Diagnosis present

## 2022-06-05 DIAGNOSIS — R1115 Cyclical vomiting syndrome unrelated to migraine: Secondary | ICD-10-CM | POA: Diagnosis present

## 2022-06-05 MED ORDER — DESCOVY 200-25 MG PO TABS: 1.0000 | ORAL_TABLET | Freq: Every day | ORAL | 11 refills | Status: DC

## 2022-06-05 MED ORDER — TIVICAY 50 MG PO TABS: 50.0000 mg | ORAL_TABLET | Freq: Two times a day (BID) | ORAL | 11 refills | Status: DC

## 2022-06-05 MED ORDER — ZIDOVUDINE 300 MG PO TABS: 300.0000 mg | ORAL_TABLET | Freq: Two times a day (BID) | ORAL | 11 refills | Status: DC

## 2022-06-05 MED ORDER — ROSUVASTATIN CALCIUM 20 MG PO TABS: 20.0000 mg | ORAL_TABLET | Freq: Every day | ORAL | 11 refills | Status: DC

## 2022-06-05 MED ORDER — RUKOBIA 600 MG PO TB12: ORAL_TABLET | ORAL | 11 refills | Status: DC

## 2022-06-05 NOTE — Progress Notes (Signed)
Subjective:   Chief complaint: Nausea though it is improved since I last saw him with higher dose Phenergan    Patient ID: Brandon [name], male    DOB: 04-24-1965, 57 y.o.   MRN: [id number]  HPI   57 year old Brandon [name] is a highly complicated man with history of  HIV/AIDS and Multi-DRUG RESISTANT virus formerly followed at [facility]. His HIV nadir was  20 when we first met him     He had been on various complicated antiretroviral regimens in the past, with unfortunate GENOTYPIC resistance to all non-nucleoside reverse transcriptase inhibitors and all NRTIs, Resistance to all protease inhibitors with the exception of Prezista which had some activity genotypically,, Resistance to Isentress, and Elvitegravir and  reduced S to dolutegravir having both a 148H and 140S  and with Dual tropic virus.   02/12/2010 phenotype at [facility] showed:  RT: NRTI: ABC, DDI, D4T, AZT, TDF: resistant; 3TC, FTC: susceptible; NNRTI: EFV susceptible; RPV, NVP, ETR, DLV: Resistant; PI: pan-resistant   He then decided to go back onto [facility] and WAS  referred to [facility].   We  Had  seen him and placed him on a  salvage regimen of Prezista 600mg   Twice daily boosted with Norvir 100mg  twice daily, Tivicay twice daily, Combivir twice daily and once daily Viread.   And since then he haD BEEN WITH AN UNDETECTABLE VIRAL LOAD <20 FOR MORE THAN  four  YEARS  And  Healthy CD4 count.   Did have one time where his viral load popped into the thousands but there was no resistance seen and he was able to resuppress on :  --Tivicay 50mg  BID --Prezista 600mg  BID with  --Norvir 100mg  BID -AZT  BID --DESCOVY q daily   He continued  to have problems with nausea and vomiting that he and I  attributed to DRV boosted w RTV   We subsequently switched off DRV/RTV BID for Fostemsavir BID along with continuing the remainder of his salvage regimen.  His nausea had dramatically improved and he states that he does not vomit  nearly as frequently as he did before on the prior regimen.   I offered idea of swapping in injectable LEN for Rukobia but Brandon [name] really dislikes needles so that does not appeal to him.  His viral load had jumped over 260,000.    We brought him back literally 3 days later and his viral load was 6920 and resistance testing showed some resistance but not the amount of resistance we know that he has.   Viral load came down in the ensuing check.  He continued problems with nausea and I ended up giving him higher dose Phenergan which seems to have made a big difference to him.      Lab Results  Component Value Date   HIV1RNAQUANT NOT DETECTED 04/01/2022     Says he is back taking the medications and continues to suffer from chronic nausea.  Vomiting and nausea/ Dr Carlean Purl previously suggest the idea of cyclic vomiting associated marijuana use.  Brandon [name] does not feel that marijuana use is made any difference when he has not been on marijuana he does smoke daily.      Past Medical History:  Diagnosis Date   Abdominal pain    Anemia    Aortic atherosclerosis (HCC)    Arthritis    Back pain 02/13/2016   Constipation    Depression    Diarrhea    Diverticulosis  Family history of CVA 06/27/2021   Foot lesion 12/19/2014   Gallstones    Gastric AVM    GERD (gastroesophageal reflux disease)    GI bleed    HIV (human immunodeficiency virus infection) ([place])    Hypertension    IBS (irritable bowel syndrome)    Infectious colitis    Internal hemorrhoids    Interstitial cystitis    Lymphopenia 05/31/2020   Mechanical heart valve present    Nausea & vomiting    Osteopenia 06/02/2018   Pancreatitis    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   Stroke ([place])    Weight loss, unintentional     Past Surgical History:  Procedure Laterality Date   AORTIC VALVE REPLACEMENT     CARDIAC SURGERY     CHOLECYSTECTOMY  02/12/2012   Procedure: LAPAROSCOPIC  CHOLECYSTECTOMY;  Surgeon: Stark Klein, MD;  Location: [facility] OR;  Service: General;  Laterality: N/A;   COLONOSCOPY WITH ESOPHAGOGASTRODUODENOSCOPY (EGD)     with polypectomy   ESOPHAGOGASTRODUODENOSCOPY N/A 07/24/2012   Procedure: ESOPHAGOGASTRODUODENOSCOPY (EGD);  Surgeon: Beryle Beams, MD;  Location: [facility] ENDOSCOPY;  Service: Endoscopy;  Laterality: N/A;   KNEE SURGERY     MULTIPLE EXTRACTIONS WITH ALVEOLOPLASTY N/A 03/13/2018   Procedure: MULTIPLE EXTRACTION;  Surgeon: Diona Browner, DDS;  Location: [place];  Service: Oral Surgery;  Laterality: N/A;    Family History  Problem Relation Age of Onset   Hypertension Father    Prostate cancer Father    Stomach cancer Father    Hypertension Sister    Diabetes Maternal Aunt    Parkinson's disease Paternal Aunt    Dementia Paternal Uncle    Dementia Paternal Uncle    Migraines Paternal Grandmother    Cancer - Other Cousin       Social History   Socioeconomic History   Marital status: Married    Spouse name: Not on file   Number of children: 6   Years of education: Not on file   Highest education level: Not on file  Occupational History   Occupation: disability rep  Tobacco Use   Smoking status: Former    Packs/day: 0.10    Years: 20.00    Additional pack years: 0.00    Total pack years: 2.00    Types: Cigars, Cigarettes    Quit date: 08/02/2017    Years since quitting: 4.8   Smokeless tobacco: Never  Vaping Use   Vaping Use: Some days   Substances: CBD   Devices: CBD vaping  Substance and Sexual Activity   Alcohol use: Yes    Alcohol/week: 0.0 standard drinks of alcohol    Comment: rarely    Drug use: Yes    Frequency: 7.0 times per week    Types: Marijuana   Sexual activity: Yes    Partners: Female    Birth control/protection: Condom    Comment: declined condoms  Other Topics Concern   Not on file  Social History Narrative   Are you right handed or left handed? Right    Are you currently employed ? 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 NO   Who lives with you?    What type of home do you live in: 1 story or 2 story? 1       Social Determinants of Radio broadcast assistant Strain: Not on file  Food Insecurity: Not on file  Transportation Needs: Not on file  Physical Activity: Not on file  Stress: Not on file  Social Connections: Not on file    Allergies  Allergen Reactions   Bactrim [Sulfamethoxazole-Trimethoprim]    Bee Venom Anaphylaxis   Sulfa Antibiotics Anaphylaxis   Truvada [Emtricitabine-Tenofovir Df] Anaphylaxis and Rash    Takes plain tenofovir at home   Lidoderm [Lidocaine] Other (See Comments)    Reaction unknown   Raltegravir     resistance   Ceftriaxone Rash   Sulfamethoxazole Itching, Other (See Comments) and Rash    Other reaction(s): Hypotension (ALLERGY/intolerance)     Current Outpatient Medications:    ARIPiprazole (ABILIFY) 5 MG tablet, Take 1 tablet by mouth at bedtime., Disp: , Rfl:    colestipol (COLESTID) 1 g tablet, Take 1 tablet (1 g total) by mouth 2 (two) times daily., Disp: 30 tablet, Rfl: 0   dolutegravir (TIVICAY) 50 MG tablet, Take 1 tablet (50 mg total) by mouth 2 (two) times daily., Disp: 60 tablet, Rfl: 11   emtricitabine-tenofovir AF (DESCOVY) 200-25 MG tablet, Take 1 tablet by mouth daily., Disp: 30 tablet, Rfl: 11   EPINEPHrine (EPI-PEN) 0.3 mg/0.3 mL DEVI, Inject 0.3 mLs (0.3 mg total) into the muscle once., Disp: 1 Device, Rfl: 1   escitalopram (LEXAPRO) 20 MG tablet, TAKE 1 TABLET BY MOUTH   DAILY (Patient taking differently: Take 20 mg by mouth daily.), Disp: 30 tablet, Rfl: 1   fostemsavir tromethamine (RUKOBIA) 600 MG TB12 ER tablet, Take 1 tablet by mouth every 12 (twelve) hours., Disp: 60 tablet, Rfl: 11   furosemide (LASIX) 20 MG tablet, Please take one tablet 20 mg by mouth daily as needed for shortness of breath., Disp: , Rfl:    LORazepam (ATIVAN) 1 MG tablet, Take 1 mg by mouth at  bedtime as needed for anxiety or sleep., Disp: , Rfl:    losartan (COZAAR) 25 MG tablet, Take 25 mg by mouth daily., Disp: , Rfl:    metoprolol succinate (TOPROL-XL) 25 MG 24 hr tablet, Take 25 mg by mouth daily., Disp: , Rfl:    ondansetron (ZOFRAN) 8 MG tablet, Take 1 tablet (8 mg total) by mouth every 4 (four) hours as needed for nausea., Disp: 10 tablet, Rfl: 0   ondansetron (ZOFRAN-ODT) 4 MG disintegrating tablet, Take 1 tablet (4 mg total) by mouth every 8 (eight) hours as needed for nausea or vomiting., Disp: 10 tablet, Rfl: 0   Opium 10 MG/ML (1%) TINC, Take 0.6 mLs (6 mg total) by mouth every 6 (six) hours., Disp: 72 mL, Rfl: 0   polyethylene glycol powder (GLYCOLAX/MIRALAX) 17 GM/SCOOP powder, Take 17 g by mouth daily., Disp: , Rfl:    promethazine (PHENERGAN) 25 MG tablet, 1-2 tablets every six hours prn nausea not relieved by zofran, Disp: 100 tablet, Rfl: 3   rosuvastatin (CRESTOR) 20 MG tablet, Take 1 tablet (20 mg total) by mouth daily., Disp: 30 tablet, Rfl: 11   sucralfate (CARAFATE) 1 g tablet, Take 1 g by mouth 3 (three) times daily., Disp: , Rfl:    traZODone (DESYREL) 50 MG tablet, Take 50 mg by mouth at bedtime. , Disp: , Rfl:    valACYclovir (VALTREX) 1000 MG tablet, Take 1 tablet (1,000 mg total) by mouth daily., Disp: 30 tablet, Rfl: 11   warfarin (COUMADIN) 1 MG tablet, Take 1 mg by mouth every Friday. Take as directed with 5mg  tablet per [facility] on Fridays., Disp: , Rfl:    warfarin (COUMADIN) 4 MG tablet, Take by mouth See admin instructions. Takes  5MG  and 4MG  together (9MG  total) on Sundays and Tuesdays, Disp: , Rfl:    warfarin (COUMADIN) 5 MG tablet, Take 5 mg by mouth See admin instructions. Takes 5 MG on Monday, Wednesday, Thursday, Friday and Saturday, then 9 MG on Sunday and Tuesday, Disp: , Rfl:    zidovudine (RETROVIR) 300 MG tablet, Take 1 tablet (300 mg total) by mouth 2 (two) times daily., Disp: 60 tablet, Rfl: 11 No current facility-administered  medications for this visit.  Facility-Administered Medications Ordered in Other Visits:    0.9 %  sodium chloride infusion, , Intravenous, Once, Campbell Riches, MD  Review of Systems  Constitutional:  Negative for activity change, appetite change, chills, diaphoresis, fatigue, fever and unexpected weight change.  HENT:  Negative for congestion, rhinorrhea, sinus pressure, sneezing, sore throat and trouble swallowing.   Eyes:  Negative for photophobia and visual disturbance.  Respiratory:  Negative for cough, chest tightness, shortness of breath, wheezing and stridor.   Cardiovascular:  Negative for chest pain, palpitations and leg swelling.  Gastrointestinal:  Positive for nausea. Negative for abdominal distention, abdominal pain, anal bleeding, blood in stool, constipation, diarrhea and vomiting.  Genitourinary:  Negative for difficulty urinating, dysuria, flank pain and hematuria.  Musculoskeletal:  Negative for arthralgias, back pain, gait problem, joint swelling and myalgias.  Skin:  Negative for color change, pallor, rash and wound.  Neurological:  Negative for dizziness, tremors, weakness and light-headedness.  Hematological:  Negative for adenopathy. Does not bruise/bleed easily.  Psychiatric/Behavioral:  Negative for agitation, behavioral problems, confusion, decreased concentration, dysphoric mood and sleep disturbance.        Objective:   Physical Exam Constitutional:      General: He is not in acute distress.    Appearance: Normal appearance. He is well-developed. He is not ill-appearing or diaphoretic.  HENT:     Head: Normocephalic and atraumatic.     Right Ear: Hearing and external ear normal.     Left Ear: Hearing and external ear normal.     Nose: No nasal deformity or rhinorrhea.  Eyes:     General: No scleral icterus.    Conjunctiva/sclera: Conjunctivae normal.     Right eye: Right conjunctiva is not injected.     Left eye: Left conjunctiva is not injected.      Pupils: Pupils are equal, round, and reactive to light.  Neck:     Vascular: No JVD.  Cardiovascular:     Rate and Rhythm: Normal rate and regular rhythm.     Heart sounds: Normal heart sounds, S1 normal and S2 normal. No murmur heard.    No friction rub.  Abdominal:     General: Bowel sounds are normal. There is no distension.     Palpations: Abdomen is soft.     Tenderness: There is no abdominal tenderness.  Musculoskeletal:        General: Normal range of motion.     Right shoulder: Normal.     Left shoulder: Normal.     Cervical back: Normal range of motion and neck supple.     Right hip: Normal.     Left hip: Normal.     Right knee: Normal.     Left knee: Normal.  Lymphadenopathy:     Head:     Right side of head: No submandibular, preauricular or posterior auricular adenopathy.     Left side of head: No submandibular, preauricular or posterior auricular adenopathy.     Cervical: No cervical adenopathy.  Right cervical: No superficial or deep cervical adenopathy.    Left cervical: No superficial or deep cervical adenopathy.  Skin:    General: Skin is warm and dry.     Coloration: Skin is not pale.     Findings: No abrasion, bruising, ecchymosis, erythema, lesion or rash.     Nails: There is no clubbing.  Neurological:     General: No focal deficit present.     Mental Status: He is alert and oriented to person, place, and time.     Sensory: No sensory deficit.     Coordination: Coordination normal.     Gait: Gait normal.  Psychiatric:        Attention and Perception: He is attentive.        Mood and Affect: Mood normal.        Speech: Speech normal.        Behavior: Behavior normal. Behavior is cooperative.        Thought Content: Thought content normal.        Judgment: Judgment normal.           Assessment & Plan:   HIV disease:  I will add order HIV viral load CD4 count CBC with differential CMP, RPR GC and chlamydia and I will continue  Brandon [name]'s BID Rukobia, Tivicay, AZT and once daily Descovy   Chronic nausea and vomiting: Continue higher dose Phenergan and Zofran    Hyperlipidemia continue Crestor  Aortic valve replacement he will continue on Coumadin followed closely  Vaccine counseling recommended that he get Shingrix at a pharmacy.  Also recommended Tdap but he did not want this vaccine today.

## 2022-06-06 LAB — T-HELPER CELLS (CD4) COUNT (NOT AT ARMC)
CD4 % Helper T Cell: 25 % — ABNORMAL LOW (ref 33–65)
CD4 T Cell Abs: 440 /uL (ref 400–1790)

## 2022-06-07 ENCOUNTER — Other Ambulatory Visit (HOSPITAL_COMMUNITY): Payer: Self-pay

## 2022-06-07 LAB — URINE CYTOLOGY ANCILLARY ONLY
Chlamydia: NEGATIVE
Comment: NEGATIVE
Comment: NORMAL
Neisseria Gonorrhea: NEGATIVE

## 2022-06-08 LAB — CBC WITH DIFFERENTIAL/PLATELET
Absolute Monocytes: 369 cells/uL (ref 200–950)
Basophils Absolute: 41 cells/uL (ref 0–200)
Basophils Relative: 0.9 %
Eosinophils Absolute: 59 cells/uL (ref 15–500)
Eosinophils Relative: 1.3 %
HCT: 33.1 % — ABNORMAL LOW (ref 38.5–50.0)
Hemoglobin: 11.3 g/dL — ABNORMAL LOW (ref 13.2–17.1)
Lymphs Abs: 1976 cells/uL (ref 850–3900)
MCH: 36.5 pg — ABNORMAL HIGH (ref 27.0–33.0)
MCHC: 34.1 g/dL (ref 32.0–36.0)
MCV: 106.8 fL — ABNORMAL HIGH (ref 80.0–100.0)
MPV: 12 fL (ref 7.5–12.5)
Monocytes Relative: 8.2 %
Neutro Abs: 2057 cells/uL (ref 1500–7800)
Neutrophils Relative %: 45.7 %
Platelets: 206 10*3/uL (ref 140–400)
RBC: 3.1 10*6/uL — ABNORMAL LOW (ref 4.20–5.80)
RDW: 15.1 % — ABNORMAL HIGH (ref 11.0–15.0)
Total Lymphocyte: 43.9 %
WBC: 4.5 10*3/uL (ref 3.8–10.8)

## 2022-06-08 LAB — LIPID PANEL
Cholesterol: 92 mg/dL (ref <200)
HDL: 46 mg/dL (ref >=40)
LDL Cholesterol (Calc): 30 mg/dL (calc)
Non-HDL Cholesterol (Calc): 46 mg/dL (calc) (ref <130)
Total CHOL/HDL Ratio: 2 (calc) (ref <5.0)
Triglycerides: 78 mg/dL (ref <150)

## 2022-06-08 LAB — COMPLETE METABOLIC PANEL WITH GFR
AG Ratio: 1.9 (calc) (ref 1.0–2.5)
ALT: 24 U/L (ref 9–46)
AST: 36 U/L — ABNORMAL HIGH (ref 10–35)
Albumin: 4.1 g/dL (ref 3.6–5.1)
Alkaline phosphatase (APISO): 89 U/L (ref 35–144)
BUN: 14 mg/dL (ref 7–25)
CO2: 28 mmol/L (ref 20–32)
Calcium: 9 mg/dL (ref 8.6–10.3)
Chloride: 107 mmol/L (ref 98–110)
Creat: 1.2 mg/dL (ref 0.70–1.30)
Globulin: 2.2 g/dL (calc) (ref 1.9–3.7)
Glucose, Bld: 69 mg/dL (ref 65–99)
Potassium: 4.1 mmol/L (ref 3.5–5.3)
Sodium: 140 mmol/L (ref 135–146)
Total Bilirubin: 1 mg/dL (ref 0.2–1.2)
Total Protein: 6.3 g/dL (ref 6.1–8.1)
eGFR: 72 mL/min/{1.73_m2} (ref >=60)

## 2022-06-08 LAB — HIV-1 RNA QUANT-NO REFLEX-BLD
HIV 1 RNA Quant: <20 Copies/mL — ABNORMAL HIGH
HIV-1 RNA Quant, Log: <1.3 Log cps/mL — ABNORMAL HIGH

## 2022-06-08 LAB — RPR: RPR Ser Ql: NONREACTIVE

## 2022-06-10 ENCOUNTER — Other Ambulatory Visit (HOSPITAL_COMMUNITY): Payer: Self-pay

## 2022-06-18 ENCOUNTER — Other Ambulatory Visit: Payer: Self-pay | Admitting: Infectious Disease

## 2022-07-02 ENCOUNTER — Ambulatory Visit: Payer: 59 | Admitting: Infectious Disease

## 2022-09-30 ENCOUNTER — Emergency Department (HOSPITAL_BASED_OUTPATIENT_CLINIC_OR_DEPARTMENT_OTHER)
Admission: EM | Admit: 2022-09-30 | Discharge: 2022-09-30 | Disposition: A | Discharge status: Home / Self Care | Payer: 59 | Attending: Emergency Medicine | Admitting: Emergency Medicine

## 2022-09-30 ENCOUNTER — Other Ambulatory Visit: Payer: Self-pay

## 2022-09-30 ENCOUNTER — Encounter (HOSPITAL_BASED_OUTPATIENT_CLINIC_OR_DEPARTMENT_OTHER): Payer: Self-pay | Admitting: Emergency Medicine

## 2022-09-30 DIAGNOSIS — Z21 Asymptomatic human immunodeficiency virus [HIV] infection status: Secondary | ICD-10-CM | POA: Insufficient documentation

## 2022-09-30 DIAGNOSIS — R1010 Upper abdominal pain, unspecified: Secondary | ICD-10-CM

## 2022-09-30 DIAGNOSIS — E860 Dehydration: Secondary | ICD-10-CM

## 2022-09-30 DIAGNOSIS — N179 Acute kidney failure, unspecified: Secondary | ICD-10-CM | POA: Insufficient documentation

## 2022-09-30 DIAGNOSIS — K2980 Duodenitis without bleeding: Secondary | ICD-10-CM | POA: Insufficient documentation

## 2022-09-30 DIAGNOSIS — R112 Nausea with vomiting, unspecified: Secondary | ICD-10-CM | POA: Diagnosis present

## 2022-09-30 DIAGNOSIS — R030 Elevated blood-pressure reading, without diagnosis of hypertension: Secondary | ICD-10-CM

## 2022-09-30 DIAGNOSIS — Z7901 Long term (current) use of anticoagulants: Secondary | ICD-10-CM | POA: Insufficient documentation

## 2022-09-30 DIAGNOSIS — F12188 Cannabis abuse with other cannabis-induced disorder: Secondary | ICD-10-CM | POA: Diagnosis not present

## 2022-09-30 LAB — URINALYSIS, ROUTINE W REFLEX MICROSCOPIC
Bacteria, UA: NONE SEEN
Bilirubin Urine: NEGATIVE
Glucose, UA: NEGATIVE mg/dL
Ketones, ur: NEGATIVE mg/dL
Leukocytes,Ua: NEGATIVE
Nitrite: NEGATIVE
Protein, ur: 30 mg/dL — AB
Specific Gravity, Urine: 1.013 (ref 1.005–1.030)
pH: 5.5 (ref 5.0–8.0)

## 2022-09-30 LAB — RAPID URINE DRUG SCREEN, HOSP PERFORMED
Amphetamines: NOT DETECTED
Barbiturates: NOT DETECTED
Benzodiazepines: POSITIVE — AB
Cocaine: NOT DETECTED
Opiates: POSITIVE — AB
Tetrahydrocannabinol: POSITIVE — AB

## 2022-09-30 LAB — CBC
HCT: 46.4 % (ref 39.0–52.0)
Hemoglobin: 16 g/dL (ref 13.0–17.0)
MCH: 38.3 pg — ABNORMAL HIGH (ref 26.0–34.0)
MCHC: 34.5 g/dL (ref 30.0–36.0)
MCV: 111 fL — ABNORMAL HIGH (ref 80.0–100.0)
Platelets: 197 10*3/uL (ref 150–400)
RBC: 4.18 MIL/uL — ABNORMAL LOW (ref 4.22–5.81)
RDW: 13.2 % (ref 11.5–15.5)
WBC: 5.9 10*3/uL (ref 4.0–10.5)
nRBC: 0 % (ref 0.0–0.2)

## 2022-09-30 LAB — COMPREHENSIVE METABOLIC PANEL
ALT: 27 U/L (ref 0–44)
AST: 28 U/L (ref 15–41)
Albumin: 5.2 g/dL — ABNORMAL HIGH (ref 3.5–5.0)
Alkaline Phosphatase: 101 U/L (ref 38–126)
Anion gap: 12 (ref 5–15)
BUN: 20 mg/dL (ref 6–20)
CO2: 29 mmol/L (ref 22–32)
Calcium: 10.4 mg/dL — ABNORMAL HIGH (ref 8.9–10.3)
Chloride: 97 mmol/L — ABNORMAL LOW (ref 98–111)
Creatinine, Ser: 1.87 mg/dL — ABNORMAL HIGH (ref 0.61–1.24)
GFR, Estimated: 42 mL/min — ABNORMAL LOW (ref >60)
Glucose, Bld: 108 mg/dL — ABNORMAL HIGH (ref 70–99)
Potassium: 4 mmol/L (ref 3.5–5.1)
Sodium: 138 mmol/L (ref 135–145)
Total Bilirubin: 1.3 mg/dL — ABNORMAL HIGH (ref 0.3–1.2)
Total Protein: 8.6 g/dL — ABNORMAL HIGH (ref 6.5–8.1)

## 2022-09-30 LAB — PROTIME-INR
INR: 2 — ABNORMAL HIGH (ref 0.8–1.2)
Prothrombin Time: 23 seconds — ABNORMAL HIGH (ref 11.4–15.2)

## 2022-09-30 LAB — LIPASE, BLOOD: Lipase: 26 U/L (ref 11–51)

## 2022-09-30 MED ORDER — HYDROMORPHONE HCL 1 MG/ML IJ SOLN
0.5000 mg | Freq: Once | INTRAMUSCULAR | Status: AC
  Administered 2022-09-30: 0.5 mg via INTRAVENOUS
  Filled 2022-09-30: qty 1

## 2022-09-30 MED ORDER — LACTATED RINGERS IV BOLUS
1000.0000 mL | Freq: Once | INTRAVENOUS | Status: AC
  Administered 2022-09-30: 1000 mL via INTRAVENOUS

## 2022-09-30 MED ORDER — ONDANSETRON HCL 4 MG/2ML IJ SOLN
4.0000 mg | Freq: Once | INTRAMUSCULAR | Status: AC
  Administered 2022-09-30: 4 mg via INTRAVENOUS
  Filled 2022-09-30: qty 2

## 2022-09-30 MED ORDER — PANTOPRAZOLE SODIUM 40 MG IV SOLR
40.0000 mg | Freq: Once | INTRAVENOUS | Status: AC
  Administered 2022-09-30: 40 mg via INTRAVENOUS
  Filled 2022-09-30: qty 10

## 2022-09-30 NOTE — Discharge Instructions (Addendum)
It was our pleasure to provide your ER care today - we hope that you feel better.  Drink plenty of fluids/stay well hydrated. Take zofran as need for nausea. Continue acid blocker medication (omeprazole, as prescribed by your GI doctor).   Your blood pressure is high today - take your meds as prescribed, limit salt intake, and follow up closely with primary care doctor in 1-2 weeks.  Your kidney test was mildly high today, likely reflecting dehydration - you were given iv fluids to help.  Follow up your doctor for repeat testing in the next 3-4 days.    Your coumadin level/INR is 2 - contact your doctor today to discuss, and to discuss whether or not they want to adjust your coumadin dose.   Note that increasingly we are seeing a recurrent abdominal pain and/or vomiting syndrome called Cannabinoid Hyperemesis Syndrome - see attached info - in these cases avoiding marijuana use will prevent symptoms from recurrent (note that symptoms can persist for a few weeks if history of heavy marijuana use as it can take time to get out of system).   Return to ER right away  if worse, fevers, new symptoms, new or worsening or severe abdominal pain, persistent vomiting, chest pain, trouble breathing, or other emergency concern.  You were given pain meds in the ER - no driving for the next 6 hours.

## 2022-09-30 NOTE — ED Notes (Signed)
Pt offered fluids and given apple juice to drink.

## 2022-09-30 NOTE — ED Provider Notes (Signed)
[place] EMERGENCY DEPARTMENT AT [facility] Provider Note   CSN: [id number] Arrival date & time: 09/30/22  1191     History  Chief Complaint  Patient presents with   Emesis    DORRIAN DOGGETT is a 57 y.o. male.  Pt with hx hiv, pancreatitis, presents c/o nausea/vomiting, and epigastric pain in the past 1-2 days. Emesis clear/yellow, not bloody or bilious. A few diarrhea stools, not bloody. No abd distension. Epigastric pain, dull, non radiating. Remote hx cholecystectomy. Had egd and colonoscopy 4 days ago - duodenitis noted. No chest pain or discomfort. No sob. No fever or chills. No known ill contacts or bad food ingestion.   The history is provided by the patient, a relative and medical records.  Emesis Associated symptoms: abdominal pain and diarrhea   Associated symptoms: no cough, no fever, no headaches and no sore throat        Home Medications Prior to Admission medications   Medication Sig Start Date End Date Taking? Authorizing Provider  ARIPiprazole (ABILIFY) 5 MG tablet Take 1 tablet by mouth at bedtime. 04/21/20   [provider]  colestipol (COLESTID) 1 g tablet Take 1 tablet (1 g total) by mouth 2 (two) times daily. 09/07/21   Armbruster, Willaim Rayas, MD  dolutegravir (TIVICAY) 50 MG tablet Take 1 tablet (50 mg total) by mouth 2 (two) times daily. 06/05/22   Randall Hiss, MD  emtricitabine-tenofovir AF (DESCOVY) 200-25 MG tablet Take 1 tablet by mouth daily. 06/05/22   Randall Hiss, MD  EPINEPHrine (EPI-PEN) 0.3 mg/0.3 mL DEVI Inject 0.3 mLs (0.3 mg total) into the muscle once. 08/20/12   Randall Hiss, MD  escitalopram (LEXAPRO) 20 MG tablet TAKE 1 TABLET BY MOUTH   DAILY Patient taking differently: Take 20 mg by mouth daily. 02/14/16   Randall Hiss, MD  fostemsavir tromethamine (RUKOBIA) 600 MG TB12 ER tablet Take 1 tablet by mouth every 12 (twelve) hours. 06/05/22   Randall Hiss, MD  furosemide (LASIX) 20 MG  tablet Please take one tablet 20 mg by mouth daily as needed for shortness of breath. 01/04/20   [provider]  LORazepam (ATIVAN) 1 MG tablet Take 1 mg by mouth at bedtime as needed for anxiety or sleep.    [provider]  losartan (COZAAR) 25 MG tablet Take 25 mg by mouth daily. 09/24/19   [provider]  metoprolol succinate (TOPROL-XL) 25 MG 24 hr tablet Take 25 mg by mouth daily. 11/18/19   [provider]  ondansetron (ZOFRAN) 8 MG tablet Take 1 tablet (8 mg total) by mouth every 4 (four) hours as needed for nausea. 09/25/21   Geoffery Lyons, MD  ondansetron (ZOFRAN-ODT) 4 MG disintegrating tablet Take 1 tablet (4 mg total) by mouth every 8 (eight) hours as needed for nausea or vomiting. 02/05/22   Roxy Horseman, PA-C  Opium 10 MG/ML (1%) TINC Take 0.6 mLs (6 mg total) by mouth every 6 (six) hours. 07/25/16   Randall Hiss, MD  polyethylene glycol powder [facility]) 17 GM/SCOOP powder Take 17 g by mouth daily. 06/19/21   [provider]  promethazine (PHENERGAN) 25 MG tablet 1-2 tablets every six hours prn nausea not relieved by zofran 04/01/22   Daiva Eves, Lisette Grinder, MD  rosuvastatin (CRESTOR) 20 MG tablet Take 1 tablet (20 mg total) by mouth daily. 06/05/22   Randall Hiss, MD  sucralfate (CARAFATE) 1 g tablet Take  1 g by mouth 3 (three) times daily. 02/19/22   [provider]  traZODone (DESYREL) 50 MG tablet Take 50 mg by mouth at bedtime.  12/11/18   [provider]  valACYclovir (VALTREX) 1000 MG tablet Take 1 tablet (1,000 mg total) by mouth daily. 01/07/22   Randall Hiss, MD  warfarin (COUMADIN) 1 MG tablet Take 1 mg by mouth every Friday. Take as directed with 5mg  tablet per [facility] on Fridays. 03/24/14   [provider]  warfarin (COUMADIN) 4 MG tablet Take by mouth See admin instructions. Takes 5MG  and 4MG  together (9MG  total) on Sundays and Tuesdays 10/22/19   [provider]  warfarin (COUMADIN) 5 MG tablet Take 5 mg by mouth See admin instructions. Takes 5 MG on Monday, Wednesday, Thursday, Friday and Saturday, then 9 MG on Sunday and Tuesday    [provider]  zidovudine (RETROVIR) 300 MG tablet Take 1 tablet (300 mg total) by mouth 2 (two) times daily. 06/05/22   Randall Hiss, MD      Allergies    Bactrim [sulfamethoxazole-trimethoprim], Bee venom, Sulfa antibiotics, Truvada [emtricitabine-tenofovir df], Lidoderm [lidocaine], Raltegravir, Ceftriaxone, and Sulfamethoxazole    Review of Systems   Review of Systems  Constitutional:  Negative for fever.  HENT:  Negative for sore throat.   Eyes:  Negative for redness.  Respiratory:  Negative for cough and shortness of breath.   Cardiovascular:  Negative for chest pain.  Gastrointestinal:  Positive for abdominal pain, diarrhea, nausea and vomiting.  Genitourinary:  Negative for dysuria and flank pain.  Musculoskeletal:  Negative for back pain and neck pain.  Skin:  Negative for rash.  Neurological:  Negative for headaches.    Physical Exam Updated Vital Signs BP (!) 147/93 (BP Location: Left Arm)   Pulse 88   Temp 97.8 F (36.6 C) (Oral)   Resp 18   Ht 1.753 m (5\' 9" )   Wt 56.7 kg   SpO2 95%   BMI 18.46 kg/m  Physical Exam Vitals and nursing note reviewed.  Constitutional:      Appearance: Normal appearance. He is well-developed.  HENT:     Head: Atraumatic.     Nose: Nose normal.     Mouth/Throat:     Mouth: Mucous membranes are moist.     Pharynx: Oropharynx is clear.  Eyes:     General: No scleral icterus.    Conjunctiva/sclera: Conjunctivae normal.  Neck:     Trachea: No tracheal deviation.  Cardiovascular:     Rate and Rhythm: Normal rate and regular rhythm.     Pulses: Normal pulses.     Comments: +mech valve.  Pulmonary:     Effort: Pulmonary effort is normal. No accessory muscle usage or respiratory distress.     Breath sounds: Normal breath sounds.   Abdominal:     General: Bowel sounds are normal. There is no distension.     Palpations: Abdomen is soft.     Tenderness: There is abdominal tenderness. There is no guarding.     Comments: Epigastric tenderness.   Genitourinary:    Comments: No cva tenderness. Musculoskeletal:        General: No swelling or tenderness.     Cervical back: Normal range of motion and neck supple. No rigidity.  Skin:    General: Skin is warm and dry.     Findings: No rash.  Neurological:     Mental Status: He is alert.  Comments: Alert, speech clear.   Psychiatric:        Mood and Affect: Mood normal.     ED Results / Procedures / Treatments   Labs (all labs ordered are listed, but only abnormal results are displayed) Results for orders placed or performed during the hospital encounter of 09/30/22  Lipase, blood  Result Value Ref Range   Lipase 26 11 - 51 U/L  Comprehensive metabolic panel  Result Value Ref Range   Sodium 138 135 - 145 mmol/L   Potassium 4.0 3.5 - 5.1 mmol/L   Chloride 97 (L) 98 - 111 mmol/L   CO2 29 22 - 32 mmol/L   Glucose, Bld 108 (H) 70 - 99 mg/dL   BUN 20 6 - 20 mg/dL   Creatinine, Ser 1.61 (H) 0.61 - 1.24 mg/dL   Calcium 09.6 (H) 8.9 - 10.3 mg/dL   Total Protein 8.6 (H) 6.5 - 8.1 g/dL   Albumin 5.2 (H) 3.5 - 5.0 g/dL   AST 28 15 - 41 U/L   ALT 27 0 - 44 U/L   Alkaline Phosphatase 101 38 - 126 U/L   Total Bilirubin 1.3 (H) 0.3 - 1.2 mg/dL   GFR, Estimated 42 (L) >60 mL/min   Anion gap 12 5 - 15  CBC  Result Value Ref Range   WBC 5.9 4.0 - 10.5 K/uL   RBC 4.18 (L) 4.22 - 5.81 MIL/uL   Hemoglobin 16.0 13.0 - 17.0 g/dL   HCT 04.5 40.9 - 81.1 %   MCV 111.0 (H) 80.0 - 100.0 fL   MCH 38.3 (H) 26.0 - 34.0 pg   MCHC 34.5 30.0 - 36.0 g/dL   RDW 91.4 78.2 - 95.6 %   Platelets 197 150 - 400 K/uL   nRBC 0.0 0.0 - 0.2 %  Urinalysis, Routine w reflex microscopic -Urine, Clean Catch  Result Value Ref Range   Color, Urine YELLOW YELLOW   APPearance CLEAR CLEAR    Specific Gravity, Urine 1.013 1.005 - 1.030   pH 5.5 5.0 - 8.0   Glucose, UA NEGATIVE NEGATIVE mg/dL   Hgb urine dipstick MODERATE (A) NEGATIVE   Bilirubin Urine NEGATIVE NEGATIVE   Ketones, ur NEGATIVE NEGATIVE mg/dL   Protein, ur 30 (A) NEGATIVE mg/dL   Nitrite NEGATIVE NEGATIVE   Leukocytes,Ua NEGATIVE NEGATIVE   RBC / HPF 0-5 0 - 5 RBC/hpf   WBC, UA 0-5 0 - 5 WBC/hpf   Bacteria, UA NONE SEEN NONE SEEN   Squamous Epithelial / HPF 0-5 0 - 5 /HPF   Mucus PRESENT    Hyaline Casts, UA PRESENT   Rapid urine drug screen (hospital performed)  Result Value Ref Range   Opiates POSITIVE (A) NONE DETECTED   Cocaine NONE DETECTED NONE DETECTED   Benzodiazepines POSITIVE (A) NONE DETECTED   Amphetamines NONE DETECTED NONE DETECTED   Tetrahydrocannabinol POSITIVE (A) NONE DETECTED   Barbiturates NONE DETECTED NONE DETECTED  Protime-INR  Result Value Ref Range   Prothrombin Time 23.0 (H) 11.4 - 15.2 seconds   INR 2.0 (H) 0.8 - 1.2     EKG None  Radiology No results found.  Procedures Procedures    Medications Ordered in ED Medications  HYDROmorphone (DILAUDID) injection 0.5 mg (has no administration in time range)  lactated ringers bolus 1,000 mL (1,000 mLs Intravenous New Bag/Given 09/30/22 0808)  ondansetron (ZOFRAN) injection 4 mg (4 mg Intravenous Given 09/30/22 0809)    ED Course/ Medical Decision Making/ A&P  Medical Decision Making Problems Addressed: AKI (acute kidney injury) [facility]): acute illness or injury Cannabinoid hyperemesis syndrome: chronic illness or injury with exacerbation, progression, or side effects of treatment that poses a threat to life or bodily functions Dehydration: acute illness or injury with systemic symptoms that poses a threat to life or bodily functions Duodenitis: chronic illness or injury Elevated blood pressure reading: acute illness or injury Nausea and vomiting in adult: acute illness or injury with systemic  symptoms that poses a threat to life or bodily functions    Details: Acute/chronic Upper abdominal pain: acute illness or injury    Details: Acute/chronic  Amount and/or Complexity of Data Reviewed Independent Historian:     Details: Family, hx External Data Reviewed: notes. Labs: ordered. Decision-making details documented in ED Course.  Risk Prescription drug management. Parenteral controlled substances. Decision regarding hospitalization.   Iv ns. Continuous pulse ox and cardiac monitoring. Labs ordered/sent.  Differential diagnosis includes gastritis, pud, pancreatitis, CHS, dehydration, etc . Dispo decision including potential need for admission considered - will get labs and reassess.   Reviewed nursing notes and prior charts for additional history. External reports reviewed. Additional history from: family.  Recent egd and colonoscopy results reviewed - duodenitis noted.   Given multiple prior cts for upper abd pain, nv, neg for acute process, recurrent nv syndrome, suspect possible cannabinoid hyperemesis syndrome.   Cardiac monitor: sinus rhythm, rate 88.  LR bolus. Dilaudid iv. Protonix iv. Zofran iv.   Labs reviewed/interpreted by me - wbc and hgb normal. Lipase normal. K normal. Cr mildly elev compared to prior - additional ivf. +THC use, suspect CHS.   Po fluids.  Pt reports feeling improved. Tolerating po. No recurrent emesis.  Pt currently appears stable for d/c.   Rec close pcp f/u.  Return precautions provided.          Final Clinical Impression(s) / ED Diagnoses Final diagnoses:  None    Rx / DC Orders ED Discharge Orders     None         Cathren Laine, MD 09/30/22 1059

## 2022-09-30 NOTE — ED Triage Notes (Signed)
Pt arrived POV from home, caox4, ambulatory c/o N/V/D since he had a colonoscopy done on Thursday. Pt also c/o epigastric abd pain. Pt reports approx 15-20 episodes of vomiting since yesterday, last approx 5a. Denies hematemesis. No active vomiting in triage. Hx pancreatitis.

## 2022-10-14 ENCOUNTER — Other Ambulatory Visit (HOSPITAL_COMMUNITY): Payer: Self-pay

## 2022-10-14 ENCOUNTER — Telehealth: Payer: Self-pay

## 2022-10-14 NOTE — Telephone Encounter (Signed)
[facility] Patient Advocate Encounter   Received notification from [employer] that prior authorization for Rukobia is required.   PA submitted on 10/14/22 Key [id number] Status is pending    [facility] will continue to follow.   Clearance Coots, CPhT Specialty Pharmacy Patient [facility] Phone: [phone] Fax:  [phone]

## 2022-10-14 NOTE — Telephone Encounter (Signed)
[facility] Patient Advocate Encounter  Prior Authorization for Elias Else has been approved.    PA# [id number] Effective dates: 10/14/22 through 10/14/23  Patients co-pay is $0.00.   [facility] will continue to follow.  Clearance Coots, CPhT Specialty Pharmacy Patient [facility] Phone: [phone] Fax:  [phone]

## 2022-10-15 ENCOUNTER — Telehealth: Payer: Self-pay

## 2022-10-15 NOTE — Telephone Encounter (Signed)
Request Reference Number: [id number]. RUKOBIA TAB 600MG  ER is approved through 10/14/2023. Patient may now fill this prescription and it will be covered. Left VM regarding approval. Authorization Expiration Date: 10/14/2023

## 2022-11-15 ENCOUNTER — Other Ambulatory Visit: Payer: Self-pay | Admitting: Infectious Disease

## 2022-12-09 ENCOUNTER — Other Ambulatory Visit: Payer: Self-pay

## 2022-12-09 ENCOUNTER — Other Ambulatory Visit (HOSPITAL_COMMUNITY)
Admission: RE | Admit: 2022-12-09 | Discharge: 2022-12-09 | Disposition: A | Discharge status: Home / Self Care | Payer: 59 | Source: Ambulatory Visit | Attending: Infectious Disease | Admitting: Infectious Disease

## 2022-12-09 ENCOUNTER — Ambulatory Visit (INDEPENDENT_AMBULATORY_CARE_PROVIDER_SITE_OTHER): Payer: 59 | Admitting: Infectious Disease

## 2022-12-09 ENCOUNTER — Encounter: Payer: Self-pay | Admitting: Infectious Disease

## 2022-12-09 VITALS — BP 126/77 | HR 98 | Temp 98.1°F | Ht 69.0 in | Wt 130.0 lb

## 2022-12-09 DIAGNOSIS — B20 Human immunodeficiency virus [HIV] disease: Secondary | ICD-10-CM | POA: Insufficient documentation

## 2022-12-09 DIAGNOSIS — Z23 Encounter for immunization: Secondary | ICD-10-CM | POA: Diagnosis not present

## 2022-12-09 DIAGNOSIS — Z7185 Encounter for immunization safety counseling: Secondary | ICD-10-CM

## 2022-12-09 DIAGNOSIS — E785 Hyperlipidemia, unspecified: Secondary | ICD-10-CM | POA: Insufficient documentation

## 2022-12-09 DIAGNOSIS — R1115 Cyclical vomiting syndrome unrelated to migraine: Secondary | ICD-10-CM

## 2022-12-09 DIAGNOSIS — G8929 Other chronic pain: Secondary | ICD-10-CM

## 2022-12-09 DIAGNOSIS — Z952 Presence of prosthetic heart valve: Secondary | ICD-10-CM

## 2022-12-09 HISTORY — DX: Hyperlipidemia, unspecified: E78.5

## 2022-12-09 MED ORDER — DESCOVY 200-25 MG PO TABS
1.0000 | ORAL_TABLET | Freq: Every day | ORAL | 11 refills | Status: DC
Start: 2022-12-09 — End: 2023-12-14

## 2022-12-09 MED ORDER — ROSUVASTATIN CALCIUM 20 MG PO TABS: 20.0000 mg | ORAL_TABLET | Freq: Every day | ORAL | 11 refills | Status: DC

## 2022-12-09 MED ORDER — RUKOBIA 600 MG PO TB12
ORAL_TABLET | ORAL | 11 refills | Status: DC
Start: 2022-12-09 — End: 2023-12-14

## 2022-12-09 MED ORDER — TIVICAY 50 MG PO TABS
50.0000 mg | ORAL_TABLET | Freq: Two times a day (BID) | ORAL | 11 refills | Status: DC
Start: 2022-12-09 — End: 2023-12-14

## 2022-12-09 MED ORDER — ZIDOVUDINE 300 MG PO TABS
300.0000 mg | ORAL_TABLET | Freq: Two times a day (BID) | ORAL | 11 refills | Status: DC
Start: 2022-12-09 — End: 2023-12-14

## 2022-12-09 NOTE — Progress Notes (Signed)
Subjective:   Chief complaint: Back pain shoulder pain  Patient ID: Brandon [name], male    DOB: 05-22-1965, 57 y.o.   MRN: [id number]  HPI   57 year old Brandon [name] is a highly complicated man with history of  HIV/AIDS and Multi-DRUG RESISTANT virus formerly followed at [facility]. His HIV nadir was  20 when we first met him     He had been on various complicated antiretroviral regimens in the past, with unfortunate GENOTYPIC resistance to all non-nucleoside reverse transcriptase inhibitors and all NRTIs, Resistance to all protease inhibitors with the exception of Prezista which had some activity genotypically,, Resistance to Isentress, and Elvitegravir and  reduced S to dolutegravir having both a 148H and 140S  and with Dual tropic virus.   02/12/2010 phenotype at [facility] showed:  RT: NRTI: ABC, DDI, D4T, AZT, TDF: resistant; 3TC, FTC: susceptible; NNRTI: EFV susceptible; RPV, NVP, ETR, DLV: Resistant; PI: pan-resistant   He then decided to go back onto [facility] and WAS  referred to [facility].   We  Had  seen him and placed him on a  salvage regimen of Prezista 600mg   Twice daily boosted with Norvir 100mg  twice daily, Tivicay twice daily, Combivir twice daily and once daily Viread.   And since then he haD BEEN WITH AN UNDETECTABLE VIRAL LOAD <20 FOR MORE THAN  four  YEARS  And  Healthy CD4 count.   Did have one time where his viral load popped into the thousands but there was no resistance seen and he was able to resuppress on :  --Tivicay 50mg  BID --Prezista 600mg  BID with  --Norvir 100mg  BID -AZT  BID --DESCOVY q daily   He continued  to have problems with nausea and vomiting that he and I  attributed to DRV boosted w RTV   We subsequently switched off DRV/RTV BID for Fostemsavir BID along with continuing the remainder of his salvage regimen.  His nausea had dramatically improved and he states that he does not vomit nearly as frequently as he did before on the prior regimen.    I offered idea of swapping in injectable LEN for Rukobia but Jerred really dislikes needles so that does not appeal to him.  His viral load had jumped over 260,000.    We brought him back literally 3 days later and his viral load was 6920 and resistance testing showed some resistance but not the amount of resistance we know that he has.   Viral load came down in the ensuing check.  He continued problems with nausea and I ended up giving him higher dose Phenergan which seems to have made a big difference to him.  As I last saw [name] he had upper and lower endoscopies which fortunately did not show any significant pathology for malignancy or infection such as H. pylori.  He says he is doing better with his nausea and vomiting because he is able to anticipated and get out ahead of it and it helps if he drinks fluid aggressively beforehand he is retaining fluid in his lower extremities for which she is now taking Lasix though.  He also can obtain a complaint of low back pain upper back pain and shoulder pain.  Offered referral to physical therapy but he said he could not afford that at this point in time.      Lab Results  Component Value Date   HIV1RNAQUANT <20 (H) 06/05/2022     Says he is back taking  the medications and continues to suffer from chronic nausea.  Vomiting and nausea/ Dr Leone Payor previously suggest the idea of cyclic vomiting associated marijuana use.  Cadien does not feel that marijuana use is made any difference when he has not been on marijuana he does smoke daily.      Past Medical History:  Diagnosis Date   Abdominal pain    Anemia    Aortic atherosclerosis (HCC)    Arthritis    Back pain 02/13/2016   Constipation    Depression    Diarrhea    Diverticulosis    Family history of CVA 06/27/2021   Foot lesion 12/19/2014   Gallstones    Gastric AVM    GERD (gastroesophageal reflux disease)    GI bleed    HIV (human immunodeficiency virus  infection) (HCC)    Hyperlipidemia 12/09/2022   Hypertension    IBS (irritable bowel syndrome)    Infectious colitis    Internal hemorrhoids    Interstitial cystitis    Lymphopenia 05/31/2020   Mechanical heart valve present    Nausea & vomiting    Osteopenia 06/02/2018   Pancreatitis    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   Stroke (HCC)    Weight loss, unintentional     Past Surgical History:  Procedure Laterality Date   AORTIC VALVE REPLACEMENT     CARDIAC SURGERY     CHOLECYSTECTOMY  02/12/2012   Procedure: LAPAROSCOPIC CHOLECYSTECTOMY;  Surgeon: Almond Lint, MD;  Location: [facility] OR;  Service: General;  Laterality: N/A;   COLONOSCOPY WITH ESOPHAGOGASTRODUODENOSCOPY (EGD)     with polypectomy   ESOPHAGOGASTRODUODENOSCOPY N/A 07/24/2012   Procedure: ESOPHAGOGASTRODUODENOSCOPY (EGD);  Surgeon: Theda Belfast, MD;  Location: [facility] ENDOSCOPY;  Service: Endoscopy;  Laterality: N/A;   KNEE SURGERY     MULTIPLE EXTRACTIONS WITH ALVEOLOPLASTY N/A 03/13/2018   Procedure: MULTIPLE EXTRACTION;  Surgeon: Ocie Doyne, DDS;  Location: [facility] OR;  Service: Oral Surgery;  Laterality: N/A;    Family History  Problem Relation Age of Onset   Hypertension Father    Prostate cancer Father    Stomach cancer Father    Hypertension Sister    Diabetes Maternal Aunt    Parkinson's disease Paternal Aunt    Dementia Paternal Uncle    Dementia Paternal Uncle    Migraines Paternal Grandmother    Cancer - Other Cousin       Social History   Socioeconomic History   Marital status: Married    Spouse name: Not on file   Number of children: 6   Years of education: Not on file   Highest education level: Not on file  Occupational History   Occupation: disability rep  Tobacco Use   Smoking status: Former    Current packs/day: 0.00    Average packs/day: 0.1 packs/day for 20.0 years (2.0 ttl pk-yrs)    Types: Cigars, Cigarettes    Start date: 08/02/1997    Quit date: 08/02/2017    Years  since quitting: 5.3    Passive exposure: Never   Smokeless tobacco: Never  Vaping Use   Vaping status: Some Days   Substances: CBD   Devices: CBD vaping  Substance and Sexual Activity   Alcohol use: Yes    Alcohol/week: 0.0 standard drinks of alcohol    Comment: rarely    Drug use: Yes    Frequency: 7.0 times per week    Types: Marijuana   Sexual activity: Yes    Partners: Female    Birth  control/protection: Condom    Comment: declined condoms  Other Topics Concern   Not on file  Social History Narrative   Are you right handed or left handed? Right    Are you currently employed ? 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 NO   Who lives with you?    What type of home do you live in: 1 story or 2 story? 1       Social Determinants of Health   Financial Resource Strain: Not on file  Food Insecurity: Low Risk  (07/29/2022)   [facility]   Hunger Vital Sign    Worried About Running Out of Food in the Last Year: Never true    Ran Out of Food in the Last Year: Never true  Transportation Needs: No Transportation Needs (07/29/2022)   [facility]   Transportation    In the past 12 months, has lack of reliable transportation kept you from medical appointments, meetings, work or from getting things needed for daily living? : No  Physical Activity: Not on file  Stress: Not on file  Social Connections: Not on file    Allergies  Allergen Reactions   Bactrim [Sulfamethoxazole-Trimethoprim]    Bee Venom Anaphylaxis   Sulfa Antibiotics Anaphylaxis   Truvada [Emtricitabine-Tenofovir Df] Anaphylaxis and Rash    Takes plain tenofovir at home   Lidoderm [Lidocaine] Other (See Comments)    Reaction unknown   Raltegravir     resistance   Ceftriaxone Rash   Sulfamethoxazole Itching, Other (See Comments) and Rash    Other reaction(s): Hypotension (ALLERGY/intolerance)     Current  Outpatient Medications:    ARIPiprazole (ABILIFY) 5 MG tablet, Take 1 tablet by mouth at bedtime., Disp: , Rfl:    colestipol (COLESTID) 1 g tablet, Take 1 tablet (1 g total) by mouth 2 (two) times daily., Disp: 30 tablet, Rfl: 0   dolutegravir (TIVICAY) 50 MG tablet, Take 1 tablet (50 mg total) by mouth 2 (two) times daily., Disp: 60 tablet, Rfl: 11   emtricitabine-tenofovir AF (DESCOVY) 200-25 MG tablet, Take 1 tablet by mouth daily., Disp: 30 tablet, Rfl: 11   EPINEPHrine (EPI-PEN) 0.3 mg/0.3 mL DEVI, Inject 0.3 mLs (0.3 mg total) into the muscle once., Disp: 1 Device, Rfl: 1   escitalopram (LEXAPRO) 20 MG tablet, TAKE 1 TABLET BY MOUTH   DAILY (Patient taking differently: Take 20 mg by mouth daily.), Disp: 30 tablet, Rfl: 1   fostemsavir tromethamine (RUKOBIA) 600 MG TB12 ER tablet, Take 1 tablet by mouth every 12 (twelve) hours., Disp: 60 tablet, Rfl: 11   furosemide (LASIX) 20 MG tablet, Please take one tablet 20 mg by mouth daily as needed for shortness of breath., Disp: , Rfl:    LORazepam (ATIVAN) 1 MG tablet, Take 1 mg by mouth at bedtime as needed for anxiety or sleep., Disp: , Rfl:    losartan (COZAAR) 25 MG tablet, Take 25 mg by mouth daily., Disp: , Rfl:    metoprolol succinate (TOPROL-XL) 25 MG 24 hr tablet, Take 25 mg by mouth daily., Disp: , Rfl:    ondansetron (ZOFRAN) 8 MG tablet, Take 1 tablet (8 mg total) by mouth every 4 (four) hours as needed for nausea., Disp: 10 tablet, Rfl: 0   ondansetron (ZOFRAN-ODT) 4 MG disintegrating tablet, Take 1 tablet (4 mg total) by mouth every 8 (eight) hours as needed for nausea or  vomiting., Disp: 10 tablet, Rfl: 0   Opium 10 MG/ML (1%) TINC, Take 0.6 mLs (6 mg total) by mouth every 6 (six) hours., Disp: 72 mL, Rfl: 0   polyethylene glycol powder (GLYCOLAX/MIRALAX) 17 GM/SCOOP powder, Take 17 g by mouth daily., Disp: , Rfl:    promethazine (PHENERGAN) 25 MG tablet, 1-2 tablets every six hours prn nausea not relieved by zofran, Disp: 100 tablet,  Rfl: 3   rosuvastatin (CRESTOR) 20 MG tablet, Take 1 tablet (20 mg total) by mouth daily., Disp: 30 tablet, Rfl: 11   sucralfate (CARAFATE) 1 g tablet, Take 1 g by mouth 3 (three) times daily., Disp: , Rfl:    traZODone (DESYREL) 50 MG tablet, Take 50 mg by mouth at bedtime. , Disp: , Rfl:    valACYclovir (VALTREX) 1000 MG tablet, Take 1 tablet (1,000 mg total) by mouth daily., Disp: 30 tablet, Rfl: 11   warfarin (COUMADIN) 1 MG tablet, Take 1 mg by mouth every Friday. Take as directed with 5mg  tablet per [facility] on Fridays., Disp: , Rfl:    warfarin (COUMADIN) 4 MG tablet, Take by mouth See admin instructions. Takes 5MG  and 4MG  together (9MG  total) on Sundays and Tuesdays, Disp: , Rfl:    warfarin (COUMADIN) 5 MG tablet, Take 5 mg by mouth See admin instructions. Takes 5 MG on Monday, Wednesday, Thursday, Friday and Saturday, then 9 MG on Sunday and Tuesday, Disp: , Rfl:    zidovudine (RETROVIR) 300 MG tablet, Take 1 tablet (300 mg total) by mouth 2 (two) times daily., Disp: 60 tablet, Rfl: 11 No current facility-administered medications for this visit.  Facility-Administered Medications Ordered in Other Visits:    0.9 %  sodium chloride infusion, , Intravenous, Once, Ginnie Smart, MD  Review of Systems  Constitutional:  Negative for activity change, appetite change, chills, diaphoresis, fatigue, fever and unexpected weight change.  HENT:  Negative for congestion, rhinorrhea, sinus pressure, sneezing, sore throat and trouble swallowing.   Eyes:  Negative for photophobia and visual disturbance.  Respiratory:  Negative for cough, chest tightness, shortness of breath, wheezing and stridor.   Cardiovascular:  Negative for chest pain, palpitations and leg swelling.  Gastrointestinal:  Negative for abdominal distention, abdominal pain, anal bleeding, blood in stool, constipation, diarrhea, nausea and vomiting.  Genitourinary:  Negative for difficulty urinating, dysuria, flank pain and  hematuria.  Musculoskeletal:  Positive for back pain and myalgias. Negative for arthralgias, gait problem and joint swelling.  Skin:  Negative for color change, pallor, rash and wound.  Neurological:  Negative for dizziness, tremors, weakness and light-headedness.  Hematological:  Negative for adenopathy. Does not bruise/bleed easily.  Psychiatric/Behavioral:  Negative for agitation, behavioral problems, confusion, decreased concentration, dysphoric mood and sleep disturbance.        Objective:   Physical Exam Constitutional:      General: He is not in acute distress.    Appearance: Normal appearance. He is well-developed. He is not ill-appearing or diaphoretic.  HENT:     Head: Normocephalic and atraumatic.     Right Ear: Hearing and external ear normal.     Left Ear: Hearing and external ear normal.     Nose: No nasal deformity or rhinorrhea.  Eyes:     General: No scleral icterus.    Conjunctiva/sclera: Conjunctivae normal.     Right eye: Right conjunctiva is not injected.     Left eye: Left conjunctiva is not injected.     Pupils: Pupils are equal, round, and reactive  to light.  Neck:     Vascular: No JVD.  Cardiovascular:     Rate and Rhythm: Normal rate and regular rhythm.     Heart sounds: Normal heart sounds, S1 normal and S2 normal. No murmur heard.    No friction rub.  Abdominal:     General: Bowel sounds are normal. There is no distension.     Palpations: Abdomen is soft.     Tenderness: There is no abdominal tenderness.  Musculoskeletal:        General: Normal range of motion.     Right shoulder: Normal.     Left shoulder: Normal.     Cervical back: Normal range of motion and neck supple.     Right hip: Normal.     Left hip: Normal.     Right knee: Normal.     Left knee: Normal.  Lymphadenopathy:     Head:     Right side of head: No submandibular, preauricular or posterior auricular adenopathy.     Left side of head: No submandibular, preauricular or  posterior auricular adenopathy.     Cervical: No cervical adenopathy.     Right cervical: No superficial or deep cervical adenopathy.    Left cervical: No superficial or deep cervical adenopathy.  Skin:    General: Skin is warm and dry.     Coloration: Skin is not pale.     Findings: No abrasion, bruising, ecchymosis, erythema, lesion or rash.     Nails: There is no clubbing.  Neurological:     General: No focal deficit present.     Mental Status: He is alert and oriented to person, place, and time.     Sensory: No sensory deficit.     Coordination: Coordination normal.     Gait: Gait normal.  Psychiatric:        Attention and Perception: He is attentive.        Mood and Affect: Mood normal.        Speech: Speech normal.        Behavior: Behavior normal. Behavior is cooperative.        Thought Content: Thought content normal.        Judgment: Judgment normal.           Assessment & Plan:   HIV disease:  HIV disease:  I will add order HIV viral load CD4 count CBC with differential CMP, RPR GC and chlamydia and I will continue  Turrell L Mickiewicz's salvage regimen of twice daily TIVICAY twice daily Rukobia twice daily AZT and once daily DESCOVY    Night nausea and vomiting: Continue with Phenergan and Zofran he is being followed by GI at [facility]  Back pain shoulder pain: Would refer to physical therapy but he feels like he cannot afford it we will defer to PCP   Hyperlipidemia: Continue Crestor and check lipid panel today aortic valve replacement on Coumadin he is continue to be followed at [facility] for COVID and levels  Vaccine counseling recommended that he received updated flu and COVID vaccinations

## 2022-12-10 LAB — URINE CYTOLOGY ANCILLARY ONLY
Chlamydia: NEGATIVE
Comment: NEGATIVE
Comment: NORMAL
Neisseria Gonorrhea: NEGATIVE

## 2022-12-11 LAB — T-HELPER CELLS (CD4) COUNT (NOT AT ARMC)
CD4 % Helper T Cell: 31 % — ABNORMAL LOW (ref 33–65)
CD4 T Cell Abs: 621 /uL (ref 400–1790)

## 2022-12-13 LAB — LIPID PANEL
Cholesterol: 119 mg/dL (ref <200)
HDL: 46 mg/dL (ref >=40)
LDL Cholesterol (Calc): 51 mg/dL
Non-HDL Cholesterol (Calc): 73 mg/dL (ref <130)
Total CHOL/HDL Ratio: 2.6 (calc) (ref <5.0)
Triglycerides: 132 mg/dL (ref <150)

## 2022-12-13 LAB — COMPLETE METABOLIC PANEL WITH GFR
AG Ratio: 1.6 (calc) (ref 1.0–2.5)
ALT: 34 U/L (ref 9–46)
AST: 47 U/L — ABNORMAL HIGH (ref 10–35)
Albumin: 3.9 g/dL (ref 3.6–5.1)
Alkaline phosphatase (APISO): 101 U/L (ref 35–144)
BUN: 18 mg/dL (ref 7–25)
CO2: 28 mmol/L (ref 20–32)
Calcium: 8.9 mg/dL (ref 8.6–10.3)
Chloride: 106 mmol/L (ref 98–110)
Creat: 1.19 mg/dL (ref 0.70–1.30)
Globulin: 2.4 g/dL (ref 1.9–3.7)
Glucose, Bld: 71 mg/dL (ref 65–99)
Potassium: 4.2 mmol/L (ref 3.5–5.3)
Sodium: 141 mmol/L (ref 135–146)
Total Bilirubin: 0.7 mg/dL (ref 0.2–1.2)
Total Protein: 6.3 g/dL (ref 6.1–8.1)
eGFR: 72 mL/min/{1.73_m2} (ref >=60)

## 2022-12-13 LAB — CBC WITH DIFFERENTIAL/PLATELET
Absolute Monocytes: 282 {cells}/uL (ref 200–950)
Basophils Absolute: 40 {cells}/uL (ref 0–200)
Basophils Relative: 0.9 %
Eosinophils Absolute: 88 {cells}/uL (ref 15–500)
Eosinophils Relative: 2 %
HCT: 36.2 % — ABNORMAL LOW (ref 38.5–50.0)
Hemoglobin: 12.4 g/dL — ABNORMAL LOW (ref 13.2–17.1)
Lymphs Abs: 2358 {cells}/uL (ref 850–3900)
MCH: 38.6 pg — ABNORMAL HIGH (ref 27.0–33.0)
MCHC: 34.3 g/dL (ref 32.0–36.0)
MCV: 112.8 fL — ABNORMAL HIGH (ref 80.0–100.0)
MPV: 11.3 fL (ref 7.5–12.5)
Monocytes Relative: 6.4 %
Neutro Abs: 1632 {cells}/uL (ref 1500–7800)
Neutrophils Relative %: 37.1 %
Platelets: 212 10*3/uL (ref 140–400)
RBC: 3.21 10*6/uL — ABNORMAL LOW (ref 4.20–5.80)
RDW: 13 % (ref 11.0–15.0)
Total Lymphocyte: 53.6 %
WBC: 4.4 10*3/uL (ref 3.8–10.8)

## 2022-12-13 LAB — HIV RNA, RTPCR W/R GT (RTI, PI,INT)
HIV 1 RNA Quant: NOT DETECTED {copies}/mL
HIV-1 RNA Quant, Log: NOT DETECTED {Log}

## 2022-12-13 LAB — RPR: RPR Ser Ql: NONREACTIVE

## 2023-02-03 ENCOUNTER — Encounter: Payer: Self-pay | Admitting: Gastroenterology

## 2023-02-05 ENCOUNTER — Other Ambulatory Visit: Payer: Self-pay

## 2023-02-05 ENCOUNTER — Emergency Department (HOSPITAL_BASED_OUTPATIENT_CLINIC_OR_DEPARTMENT_OTHER): Payer: 59 | Admitting: Radiology

## 2023-02-05 ENCOUNTER — Encounter (HOSPITAL_BASED_OUTPATIENT_CLINIC_OR_DEPARTMENT_OTHER): Payer: Self-pay | Admitting: Emergency Medicine

## 2023-02-05 ENCOUNTER — Emergency Department (HOSPITAL_BASED_OUTPATIENT_CLINIC_OR_DEPARTMENT_OTHER)
Admission: EM | Admit: 2023-02-05 | Discharge: 2023-02-05 | Disposition: A | Discharge status: Home / Self Care | Payer: 59 | Attending: Emergency Medicine | Admitting: Emergency Medicine

## 2023-02-05 DIAGNOSIS — I509 Heart failure, unspecified: Secondary | ICD-10-CM | POA: Diagnosis not present

## 2023-02-05 DIAGNOSIS — Z7901 Long term (current) use of anticoagulants: Secondary | ICD-10-CM | POA: Diagnosis not present

## 2023-02-05 DIAGNOSIS — B20 Human immunodeficiency virus [HIV] disease: Secondary | ICD-10-CM | POA: Insufficient documentation

## 2023-02-05 DIAGNOSIS — R0600 Dyspnea, unspecified: Secondary | ICD-10-CM

## 2023-02-05 DIAGNOSIS — R0602 Shortness of breath: Secondary | ICD-10-CM | POA: Diagnosis present

## 2023-02-05 LAB — BASIC METABOLIC PANEL
Anion gap: 7 (ref 5–15)
BUN: 19 mg/dL (ref 6–20)
CO2: 25 mmol/L (ref 22–32)
Calcium: 8.8 mg/dL — ABNORMAL LOW (ref 8.9–10.3)
Chloride: 105 mmol/L (ref 98–111)
Creatinine, Ser: 1.45 mg/dL — ABNORMAL HIGH (ref 0.61–1.24)
GFR, Estimated: 57 mL/min — ABNORMAL LOW (ref >60)
Glucose, Bld: 108 mg/dL — ABNORMAL HIGH (ref 70–99)
Potassium: 3.8 mmol/L (ref 3.5–5.1)
Sodium: 137 mmol/L (ref 135–145)

## 2023-02-05 LAB — CBC
HCT: 36.9 % — ABNORMAL LOW (ref 39.0–52.0)
Hemoglobin: 12.5 g/dL — ABNORMAL LOW (ref 13.0–17.0)
MCH: 36.5 pg — ABNORMAL HIGH (ref 26.0–34.0)
MCHC: 33.9 g/dL (ref 30.0–36.0)
MCV: 107.9 fL — ABNORMAL HIGH (ref 80.0–100.0)
Platelets: 200 10*3/uL (ref 150–400)
RBC: 3.42 MIL/uL — ABNORMAL LOW (ref 4.22–5.81)
RDW: 15.3 % (ref 11.5–15.5)
WBC: 5.3 10*3/uL (ref 4.0–10.5)
nRBC: 0 % (ref 0.0–0.2)

## 2023-02-05 LAB — TROPONIN I (HIGH SENSITIVITY): Troponin I (High Sensitivity): 51 ng/L — ABNORMAL HIGH (ref <18)

## 2023-02-05 LAB — PROTIME-INR
INR: 2.1 — ABNORMAL HIGH (ref 0.8–1.2)
Prothrombin Time: 23.4 s — ABNORMAL HIGH (ref 11.4–15.2)

## 2023-02-05 LAB — BRAIN NATRIURETIC PEPTIDE: B Natriuretic Peptide: 1619.5 pg/mL — ABNORMAL HIGH (ref 0.0–100.0)

## 2023-02-05 MED ORDER — FUROSEMIDE 20 MG PO TABS: 20.0000 mg | ORAL_TABLET | Freq: Every day | ORAL | 1 refills | Status: AC

## 2023-02-05 MED ORDER — AZITHROMYCIN 250 MG PO TABS
500.0000 mg | ORAL_TABLET | Freq: Once | ORAL | Status: AC
  Administered 2023-02-05: 500 mg via ORAL
  Filled 2023-02-05: qty 2

## 2023-02-05 MED ORDER — AZITHROMYCIN 250 MG PO TABS: 250.0000 mg | ORAL_TABLET | Freq: Every day | ORAL | 0 refills | Status: DC

## 2023-02-05 MED ORDER — FUROSEMIDE 10 MG/ML IJ SOLN
20.0000 mg | Freq: Once | INTRAMUSCULAR | Status: AC
  Administered 2023-02-05: 20 mg via INTRAVENOUS
  Filled 2023-02-05: qty 2

## 2023-02-05 NOTE — ED Triage Notes (Addendum)
Patient presents with midsternal chest pain x 3 weeks. States "it worse every week". Patient also endorses SOB. Hx aortic valve replacement

## 2023-02-05 NOTE — ED Provider Notes (Signed)
[place] EMERGENCY DEPARTMENT AT [facility] Provider Note   CSN: [id number] Arrival date & time: 02/05/23  0041     History  Chief Complaint  Patient presents with   Chest Pain   Shortness of Breath    Brandon [name] is a 57 y.o. male.  Patient is a 57 year old male with past medical history of aortic valve replacement anticoagulated on Coumadin, HIV disease, hyperlipidemia.  Patient presenting today for evaluation of chest pain and shortness of breath.  This has been worsening over the past 3 weeks.  Symptoms have been persistent and gradually worsening.  He reports occasional cough that is nonproductive.  No fevers or chills.  He does report increased leg swelling during this period of time.  He has Lasix at home that he has been told to take as needed, however has not taken this.  No ill contacts.  The history is provided by the patient.       Home Medications Prior to Admission medications   Medication Sig Start Date End Date Taking? Authorizing Provider  ARIPiprazole (ABILIFY) 5 MG tablet Take 1 tablet by mouth at bedtime. 04/21/20   [provider]  colestipol (COLESTID) 1 g tablet Take 1 tablet (1 g total) by mouth 2 (two) times daily. 09/07/21   Armbruster, Willaim Rayas, MD  dolutegravir (TIVICAY) 50 MG tablet Take 1 tablet (50 mg total) by mouth 2 (two) times daily. 12/09/22   Randall Hiss, MD  emtricitabine-tenofovir AF (DESCOVY) 200-25 MG tablet Take 1 tablet by mouth daily. 12/09/22   Randall Hiss, MD  EPINEPHrine (EPI-PEN) 0.3 mg/0.3 mL DEVI Inject 0.3 mLs (0.3 mg total) into the muscle once. 08/20/12   Randall Hiss, MD  escitalopram (LEXAPRO) 20 MG tablet TAKE 1 TABLET BY MOUTH   DAILY Patient taking differently: Take 20 mg by mouth daily. 02/14/16   Randall Hiss, MD  fostemsavir tromethamine (RUKOBIA) 600 MG TB12 ER tablet Take 1 tablet by mouth every 12 (twelve) hours. 12/09/22   Randall Hiss, MD  furosemide  (LASIX) 20 MG tablet Please take one tablet 20 mg by mouth daily as needed for shortness of breath. 01/04/20   [provider]  LORazepam (ATIVAN) 1 MG tablet Take 1 mg by mouth at bedtime as needed for anxiety or sleep.    [provider]  losartan (COZAAR) 25 MG tablet Take 25 mg by mouth daily. 09/24/19   [provider]  metoprolol succinate (TOPROL-XL) 25 MG 24 hr tablet Take 25 mg by mouth daily. 11/18/19   [provider]  ondansetron (ZOFRAN) 8 MG tablet Take 1 tablet (8 mg total) by mouth every 4 (four) hours as needed for nausea. 09/25/21   Geoffery Lyons, MD  ondansetron (ZOFRAN-ODT) 4 MG disintegrating tablet Take 1 tablet (4 mg total) by mouth every 8 (eight) hours as needed for nausea or vomiting. 02/05/22   Roxy Horseman, PA-C  Opium 10 MG/ML (1%) TINC Take 0.6 mLs (6 mg total) by mouth every 6 (six) hours. 07/25/16   Randall Hiss, MD  polyethylene glycol powder [facility]) 17 GM/SCOOP powder Take 17 g by mouth daily. 06/19/21   [provider]  promethazine (PHENERGAN) 25 MG tablet 1-2 tablets every six hours prn nausea not relieved by zofran 04/01/22   Daiva Eves, Lisette Grinder, MD  rosuvastatin (CRESTOR) 20 MG tablet Take 1 tablet (20 mg total) by mouth daily. 12/09/22   Daiva Eves, [place],  MD  sucralfate (CARAFATE) 1 g tablet Take 1 g by mouth 3 (three) times daily. 02/19/22   [provider]  traZODone (DESYREL) 50 MG tablet Take 50 mg by mouth at bedtime.  12/11/18   [provider]  valACYclovir (VALTREX) 1000 MG tablet Take 1 tablet (1,000 mg total) by mouth daily. 11/15/22   Randall Hiss, MD  warfarin (COUMADIN) 1 MG tablet Take 1 mg by mouth every Friday. Take as directed with 5mg  tablet per [facility] on Fridays. 03/24/14   [provider]  warfarin (COUMADIN) 4 MG tablet Take by mouth See admin instructions. Takes 5MG  and 4MG  together (9MG  total) on Sundays and Tuesdays 10/22/19   [provider]  warfarin (COUMADIN) 5 MG tablet Take 5 mg by mouth See admin instructions. Takes 5 MG on Monday, Wednesday, Thursday, Friday and Saturday, then 9 MG on Sunday and Tuesday    [provider]  zidovudine (RETROVIR) 300 MG tablet Take 1 tablet (300 mg total) by mouth 2 (two) times daily. 12/09/22   Randall Hiss, MD      Allergies    Bactrim [sulfamethoxazole-trimethoprim], Bee venom, Sulfa antibiotics, Truvada [emtricitabine-tenofovir df], Lidoderm [lidocaine], Raltegravir, Ceftriaxone, and Sulfamethoxazole    Review of Systems   Review of Systems  All other systems reviewed and are negative.   Physical Exam Updated Vital Signs BP (!) 156/89   Pulse 94   Temp 97.6 F (36.4 C) (Oral)   Resp (!) 22   Ht 5\' 9"  (1.753 m)   Wt 59 kg   SpO2 96%   BMI 19.21 kg/m  Physical Exam Vitals and nursing note reviewed.  Constitutional:      General: He is not in acute distress.    Appearance: He is well-developed. He is not diaphoretic.  HENT:     Head: Normocephalic and atraumatic.  Cardiovascular:     Rate and Rhythm: Normal rate and regular rhythm.     Heart sounds: No murmur heard.    No friction rub.  Pulmonary:     Effort: Pulmonary effort is normal. No respiratory distress.     Breath sounds: Normal breath sounds. No wheezing or rales.  Abdominal:     General: Bowel sounds are normal. There is no distension.     Palpations: Abdomen is soft.     Tenderness: There is no abdominal tenderness.  Musculoskeletal:        General: Normal range of motion.     Cervical back: Normal range of motion and neck supple.     Right lower leg: Edema present.     Left lower leg: Edema present.     Comments: There is 1+ edema both lower extremities.  Skin:    General: Skin is warm and dry.  Neurological:     Mental Status: He is alert and oriented to person, place, and time.     Coordination: Coordination normal.     ED Results / Procedures / Treatments    Labs (all labs ordered are listed, but only abnormal results are displayed) Labs Reviewed  BASIC METABOLIC PANEL  CBC  PROTIME-INR  BRAIN NATRIURETIC PEPTIDE  TROPONIN I (HIGH SENSITIVITY)    EKG EKG Interpretation Date/Time:  Wednesday February 05 2023 00:46:56 EST Ventricular Rate:  106 PR Interval:  156 QRS Duration:  137 QT Interval:  398 QTC Calculation: 529 R Axis:   -58  Text Interpretation: Sinus tachycardia Biatrial enlargement IVCD, consider atypical RBBB LVH with IVCD,  LAD and secondary repol abnrm Probable posterior infarct, acute Prolonged QT interval Baseline wander in lead(s) V5 No significant change since 09/30/2022 Confirmed by Geoffery Lyons ([id number]) on 02/05/2023 12:49:32 AM  Radiology No results found.  Procedures Procedures    Medications Ordered in ED Medications  furosemide (LASIX) injection 20 mg (has no administration in time range)    ED Course/ Medical Decision Making/ A&P  Patient is a 57 year old male presenting with complaints of shortness of breath and chest tightness as described in the HPI.  This has been worsening over the past 3 weeks.  Patient arrives here with stable vital signs and is afebrile.  There is no hypoxia.  Lungs are basically clear.  Workup initiated including CBC, basic metabolic panel, BNP, and troponin.  Studies remarkable for a BNP of 1600, mildly elevated troponin of 51 which seems to be his baseline.  Chest x-ray shows cardiomegaly with vascular congestion and small pleural effusions.  There is a patchy airspace at the left base may be due to atelectasis or pneumonia.  Patient has received IV Lasix here in the ER and with a good diuresis.  I suspect the cause of his symptoms is fluid retention.  He has Lasix at home and I will advise him to take 40 mg of this daily and follow-up next week with his cardiologist.  Given the possible pneumonia, I feel as though it is reasonable to also provide an antibiotic.  Patient given  Zithromax and will be discharged with a prescription for Zithromax.  Final Clinical Impression(s) / ED Diagnoses Final diagnoses:  None    Rx / DC Orders ED Discharge Orders     None         Geoffery Lyons, MD 02/05/23 [phone]

## 2023-02-05 NOTE — Discharge Instructions (Addendum)
Begin taking Lasix 40 mg once daily for the next 5 days.  Begin taking Zithromax as prescribed.  Follow-up with your primary doctor next week, and return to the ER if you develop severe chest pain, worsening breathing, high fevers, or for other new and concerning symptoms.

## 2023-05-09 ENCOUNTER — Other Ambulatory Visit: Payer: Self-pay

## 2023-05-09 DIAGNOSIS — Z113 Encounter for screening for infections with a predominantly sexual mode of transmission: Secondary | ICD-10-CM

## 2023-05-09 DIAGNOSIS — B20 Human immunodeficiency virus [HIV] disease: Secondary | ICD-10-CM

## 2023-05-12 ENCOUNTER — Other Ambulatory Visit: Payer: Self-pay

## 2023-05-12 ENCOUNTER — Other Ambulatory Visit: Payer: 59

## 2023-05-12 DIAGNOSIS — B20 Human immunodeficiency virus [HIV] disease: Secondary | ICD-10-CM

## 2023-05-12 DIAGNOSIS — Z113 Encounter for screening for infections with a predominantly sexual mode of transmission: Secondary | ICD-10-CM

## 2023-05-13 LAB — T-HELPER CELL (CD4) - (RCID CLINIC ONLY)
CD4 % Helper T Cell: 27 % — ABNORMAL LOW (ref 33–65)
CD4 T Cell Abs: 434 /uL (ref 400–1790)

## 2023-05-14 LAB — COMPLETE METABOLIC PANEL WITH GFR
AG Ratio: 1.8 (calc) (ref 1.0–2.5)
ALT: 12 U/L (ref 9–46)
AST: 18 U/L (ref 10–35)
Albumin: 3.7 g/dL (ref 3.6–5.1)
Alkaline phosphatase (APISO): 77 U/L (ref 35–144)
BUN: 17 mg/dL (ref 7–25)
CO2: 27 mmol/L (ref 20–32)
Calcium: 8.6 mg/dL (ref 8.6–10.3)
Chloride: 109 mmol/L (ref 98–110)
Creat: 1.28 mg/dL (ref 0.70–1.30)
Globulin: 2.1 g/dL (ref 1.9–3.7)
Glucose, Bld: 91 mg/dL (ref 65–99)
Potassium: 4 mmol/L (ref 3.5–5.3)
Sodium: 142 mmol/L (ref 135–146)
Total Bilirubin: 0.6 mg/dL (ref 0.2–1.2)
Total Protein: 5.8 g/dL — ABNORMAL LOW (ref 6.1–8.1)
eGFR: 66 mL/min/{1.73_m2} (ref >=60)

## 2023-05-14 LAB — CBC WITH DIFFERENTIAL/PLATELET
Absolute Lymphocytes: 1780 {cells}/uL (ref 850–3900)
Absolute Monocytes: 330 {cells}/uL (ref 200–950)
Basophils Absolute: 20 {cells}/uL (ref 0–200)
Basophils Relative: 0.4 %
Eosinophils Absolute: 30 {cells}/uL (ref 15–500)
Eosinophils Relative: 0.6 %
HCT: 36.5 % — ABNORMAL LOW (ref 38.5–50.0)
Hemoglobin: 11.9 g/dL — ABNORMAL LOW (ref 13.2–17.1)
MCH: 34.1 pg — ABNORMAL HIGH (ref 27.0–33.0)
MCHC: 32.6 g/dL (ref 32.0–36.0)
MCV: 104.6 fL — ABNORMAL HIGH (ref 80.0–100.0)
MPV: 11 fL (ref 7.5–12.5)
Monocytes Relative: 6.6 %
Neutro Abs: 2840 {cells}/uL (ref 1500–7800)
Neutrophils Relative %: 56.8 %
Platelets: 205 10*3/uL (ref 140–400)
RBC: 3.49 10*6/uL — ABNORMAL LOW (ref 4.20–5.80)
RDW: 14.5 % (ref 11.0–15.0)
Total Lymphocyte: 35.6 %
WBC: 5 10*3/uL (ref 3.8–10.8)

## 2023-05-14 LAB — RPR: RPR Ser Ql: NONREACTIVE

## 2023-05-14 LAB — HIV-1 RNA QUANT-NO REFLEX-BLD
HIV 1 RNA Quant: <20 {copies}/mL — ABNORMAL HIGH
HIV-1 RNA Quant, Log: <1.3 {Log_copies}/mL — ABNORMAL HIGH

## 2023-05-26 ENCOUNTER — Ambulatory Visit: Payer: 59 | Admitting: Infectious Disease

## 2023-05-26 ENCOUNTER — Encounter: Payer: Self-pay | Admitting: Infectious Diseases

## 2023-05-29 ENCOUNTER — Ambulatory Visit: Admitting: Infectious Disease

## 2023-08-08 NOTE — Progress Notes (Signed)
 The ASCVD Risk score (Arnett DK, et al., 2019) failed to calculate for the following reasons:   Risk score cannot be calculated because patient has a medical history suggesting prior/existing ASCVD  Arlon Bergamo, BSN, RN

## 2023-08-14 ENCOUNTER — Telehealth: Payer: Self-pay

## 2023-08-14 NOTE — Telephone Encounter (Signed)
 Received voicemail from [facility]. Returned call, pharmacist states Emanuelle is late picking up his ART. Last fill was 07/12/23.  [phone]  Jeanne Diefendorf, BSN, RN

## 2023-09-15 ENCOUNTER — Other Ambulatory Visit (HOSPITAL_COMMUNITY): Payer: Self-pay

## 2023-11-07 ENCOUNTER — Other Ambulatory Visit: Payer: Self-pay | Admitting: Infectious Disease

## 2023-11-07 DIAGNOSIS — B20 Human immunodeficiency virus [HIV] disease: Secondary | ICD-10-CM

## 2023-11-11 ENCOUNTER — Other Ambulatory Visit (HOSPITAL_COMMUNITY): Payer: Self-pay

## 2023-11-11 ENCOUNTER — Telehealth: Payer: Self-pay

## 2023-11-11 NOTE — Telephone Encounter (Signed)
 Pharmacy Patient Advocate Encounter   Received notification from [employer] that prior authorization for RUKOBIA  is required/requested.   Insurance verification completed.   The patient is insured through [facility] .   Per test claim: PA required; PA submitted to above mentioned insurance via [employer] Key/confirmation #/EOC [id number] Status is pending   CLINICAL QUESTIONS ANSWERED

## 2023-11-11 NOTE — Telephone Encounter (Signed)
 Pharmacy Patient Advocate Encounter  Received notification from [employer] that Prior Authorization for RUKOBIA  has been APPROVED from 11/11/23 to 11/10/24   PA #/Case ID/Reference #: [id number]

## 2023-11-23 IMAGING — CT CT ABD-PELV W/ CM
2 of 5 series · 16 of 46 positions shown, 18 images · IV contrast (agent unspecified)
Comparison: 06/04/2021

CLINICAL DATA: Nausea, vomiting and abdominal pain.  HIV positive.

EXAM:
CT ABDOMEN AND PELVIS WITH CONTRAST
TECHNIQUE: Multidetector CT imaging of the abdomen and pelvis was performed
using the standard protocol following bolus administration of
intravenous contrast.

[Series 2: abd pel w · axial · 0.61mm/px · z∈[+579,+949]mm · 13 of 84 slices shown, 15 images]
[im 5/84  soft-tissue]
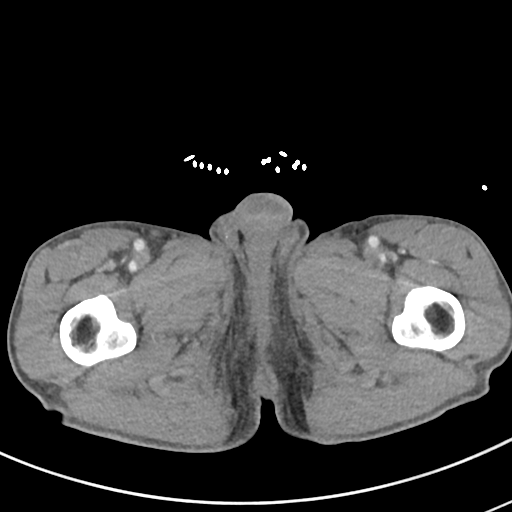
[im 5/84  bone]
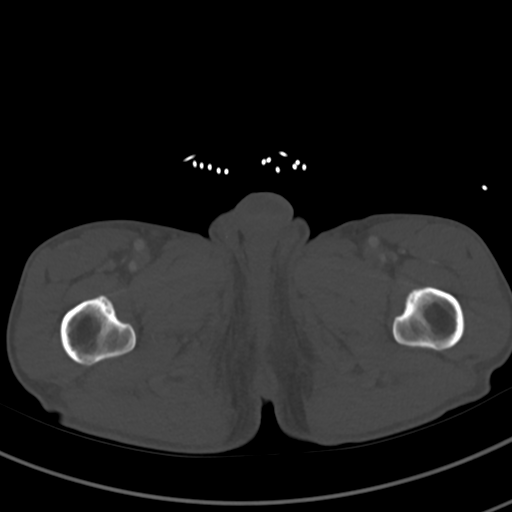
[im 14/84  soft-tissue]
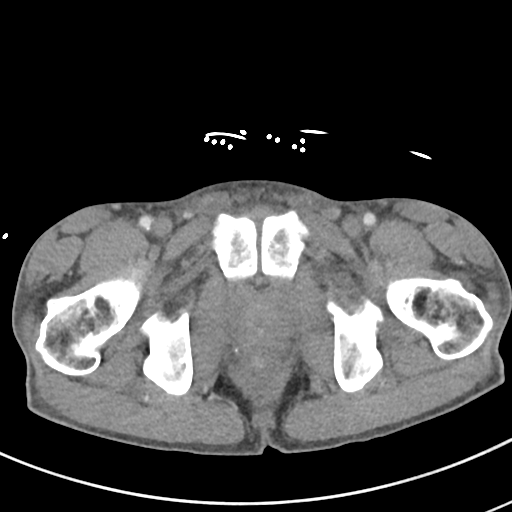
[im 18/84  soft-tissue]
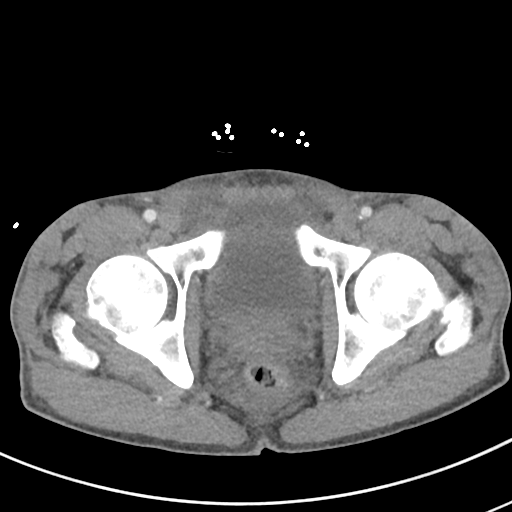
[im 22/84  soft-tissue]
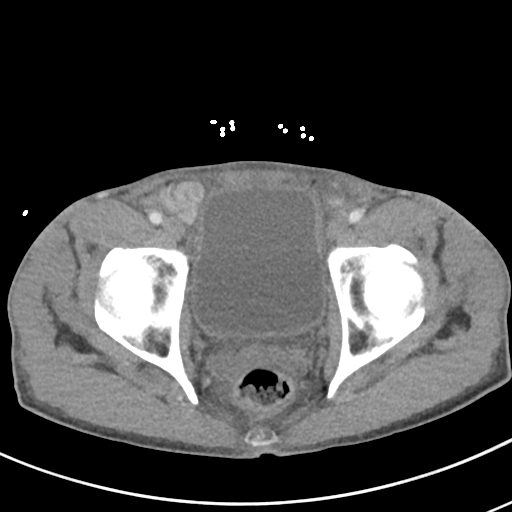
[im 31/84  soft-tissue]
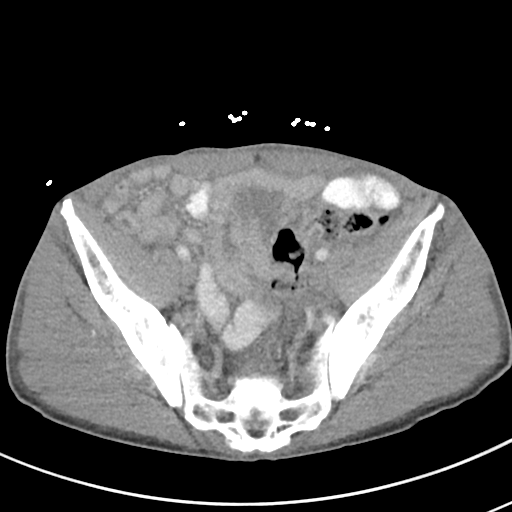
[im 35/84  soft-tissue]
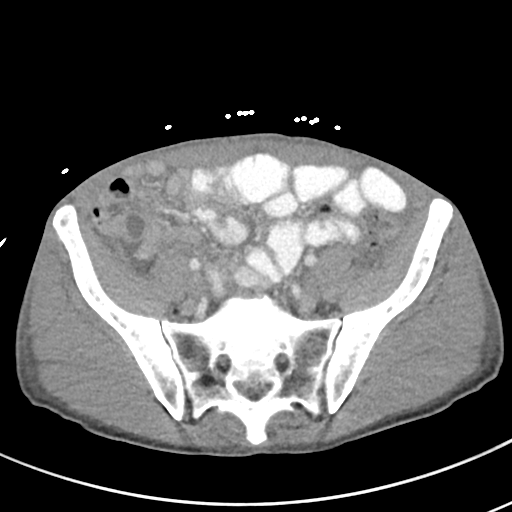
[im 44/84  soft-tissue]
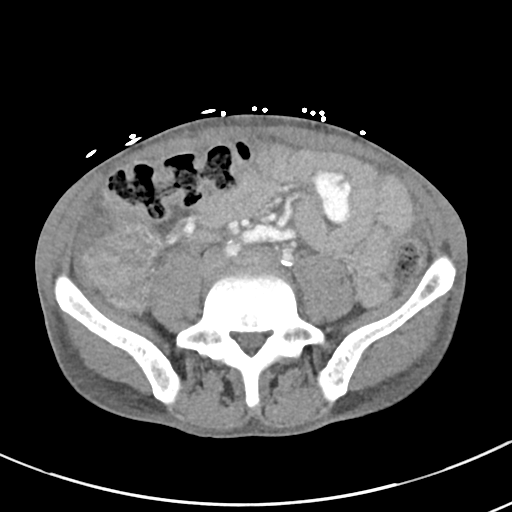
[im 49/84  soft-tissue]
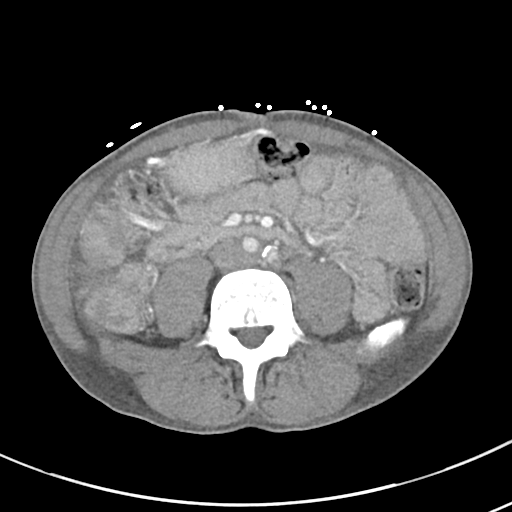
[im 53/84  soft-tissue]
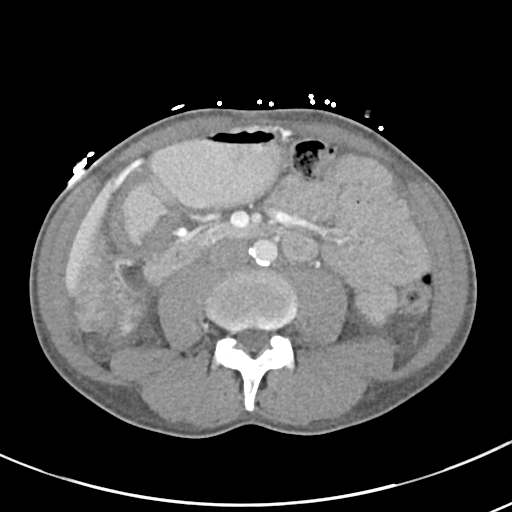
[im 53/84  bone]
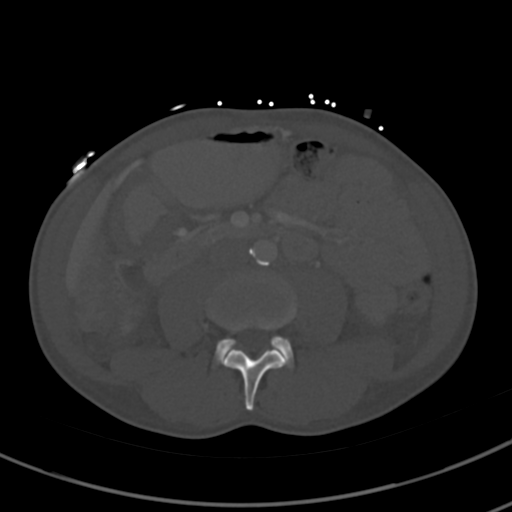
[im 62/84  soft-tissue]
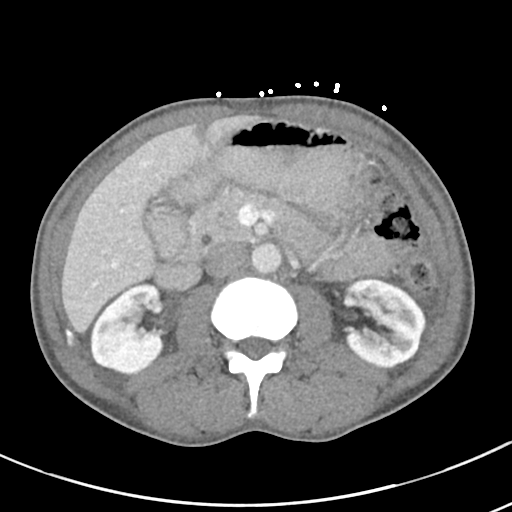
[im 66/84  soft-tissue]
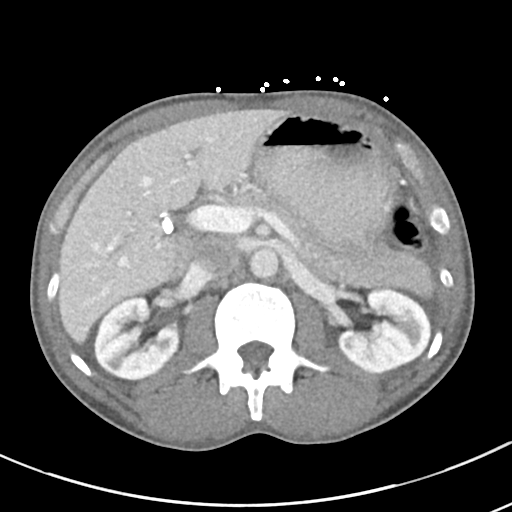
[im 70/84  soft-tissue]
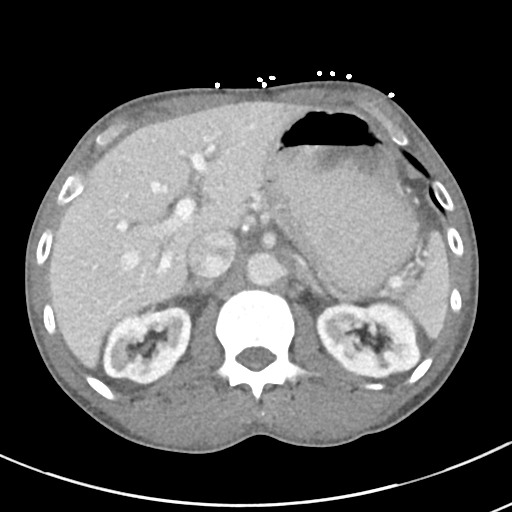
[im 79/84  soft-tissue]
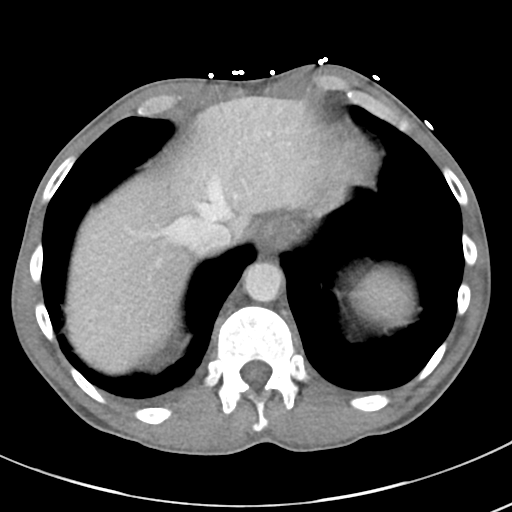

[Series 5: coronal · coronal · 0.71mm/px · 3 of 84 slices shown]
[im 28/84  soft-tissue]
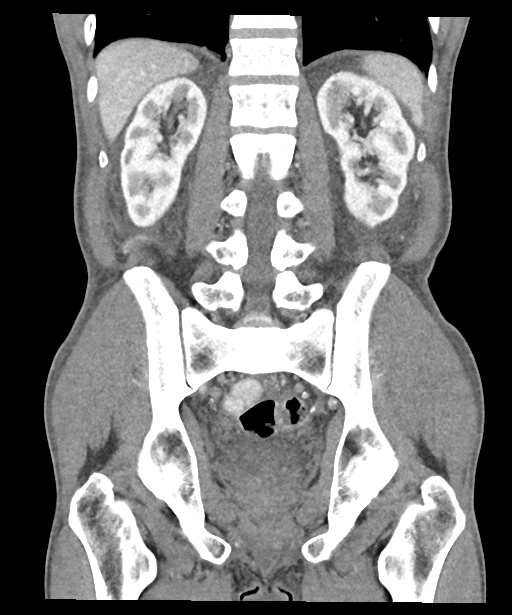
[im 37/84  soft-tissue]
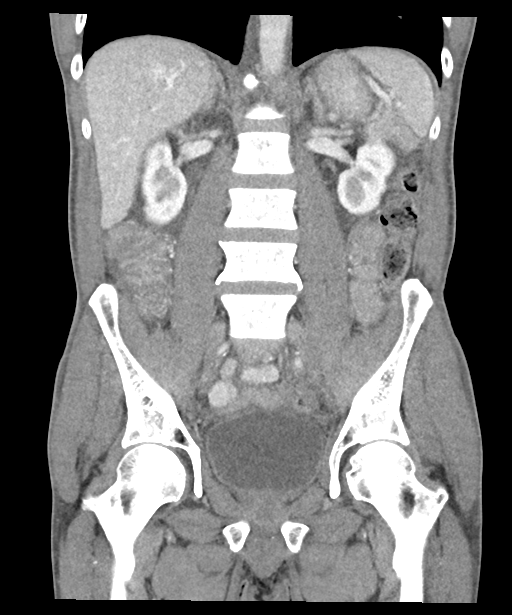
[im 47/84  soft-tissue]
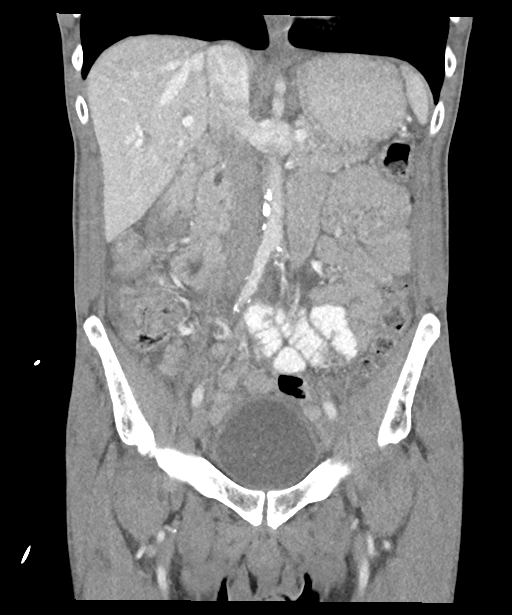

[16 of 46 positions shown; findings below may reference images not displayed]

RADIATION DOSE REDUCTION: This exam was performed according to the
departmental dose-optimization program which includes automated
exposure control, adjustment of the mA and/or kV according to
patient size and/or use of iterative reconstruction technique.

CONTRAST:  80mL OMNIPAQUE IOHEXOL 300 MG/ML  SOLN
FINDINGS: Lower chest: No acute abnormality.

Hepatobiliary: No focal liver abnormality is seen. Status post
cholecystectomy. No biliary dilatation.

Pancreas: Unremarkable. No pancreatic ductal dilatation or
surrounding inflammatory changes.

Spleen: Normal in size without focal abnormality.

Adrenals/Urinary Tract: Normal adrenal glands. No kidney mass,
nephrolithiasis, or hydronephrosis. Urinary bladder is unremarkable.

Stomach/Bowel: Stomach appears normal. Fluid-filled tubular
structure within the right lower quadrant of the abdomen measuring
1.4 cm appears contiguous with adjacent small bowel loops, image
51/2. The appendix is not confidently identified mild wall
thickening involving the ascending colon is again noted. No
pathologic dilatation of the large or small bowel loops to suggest
obstruction.

Vascular/Lymphatic: Aortic atherosclerosis. No aneurysm. No
abdominopelvic adenopathy.

Reproductive: Prostate is unremarkable.

Other: Small volume of ascites identified within the abdomen and
pelvis. No discrete fluid collections identified. No signs of
pneumoperitoneum.

Musculoskeletal: No acute or significant osseous findings.
IMPRESSION: 1. Fluid-filled tubular structure measuring 1.4 cm in diameter is
identified within the right lower quadrant of the abdomen. This is
favored to represent unopacified fluid-filled loop of small bowel.
However, the appendix is not confidently identified. If there is a
clinical concern for acute appendicitis consider repeat imaging to
allow for enteric contrast opacification of the distal small bowel
loops and proximal colon.
2. Mild wall thickening involving the ascending colon is again
noted. Correlate for symptoms of ascending colitis.
3. Small volume of ascites.
4. Aortic Atherosclerosis ([id number].C).

## 2023-11-23 IMAGING — DX DG CHEST 1V PORT
2 series · 2 of 2 positions shown · non-contrast
Comparison: Chest radiographs 06/16/2019.

CLINICAL DATA: 55-year-old male with cough and chest pain.

EXAM:
PORTABLE CHEST 1 VIEW

[chest ap (1 of 2)]
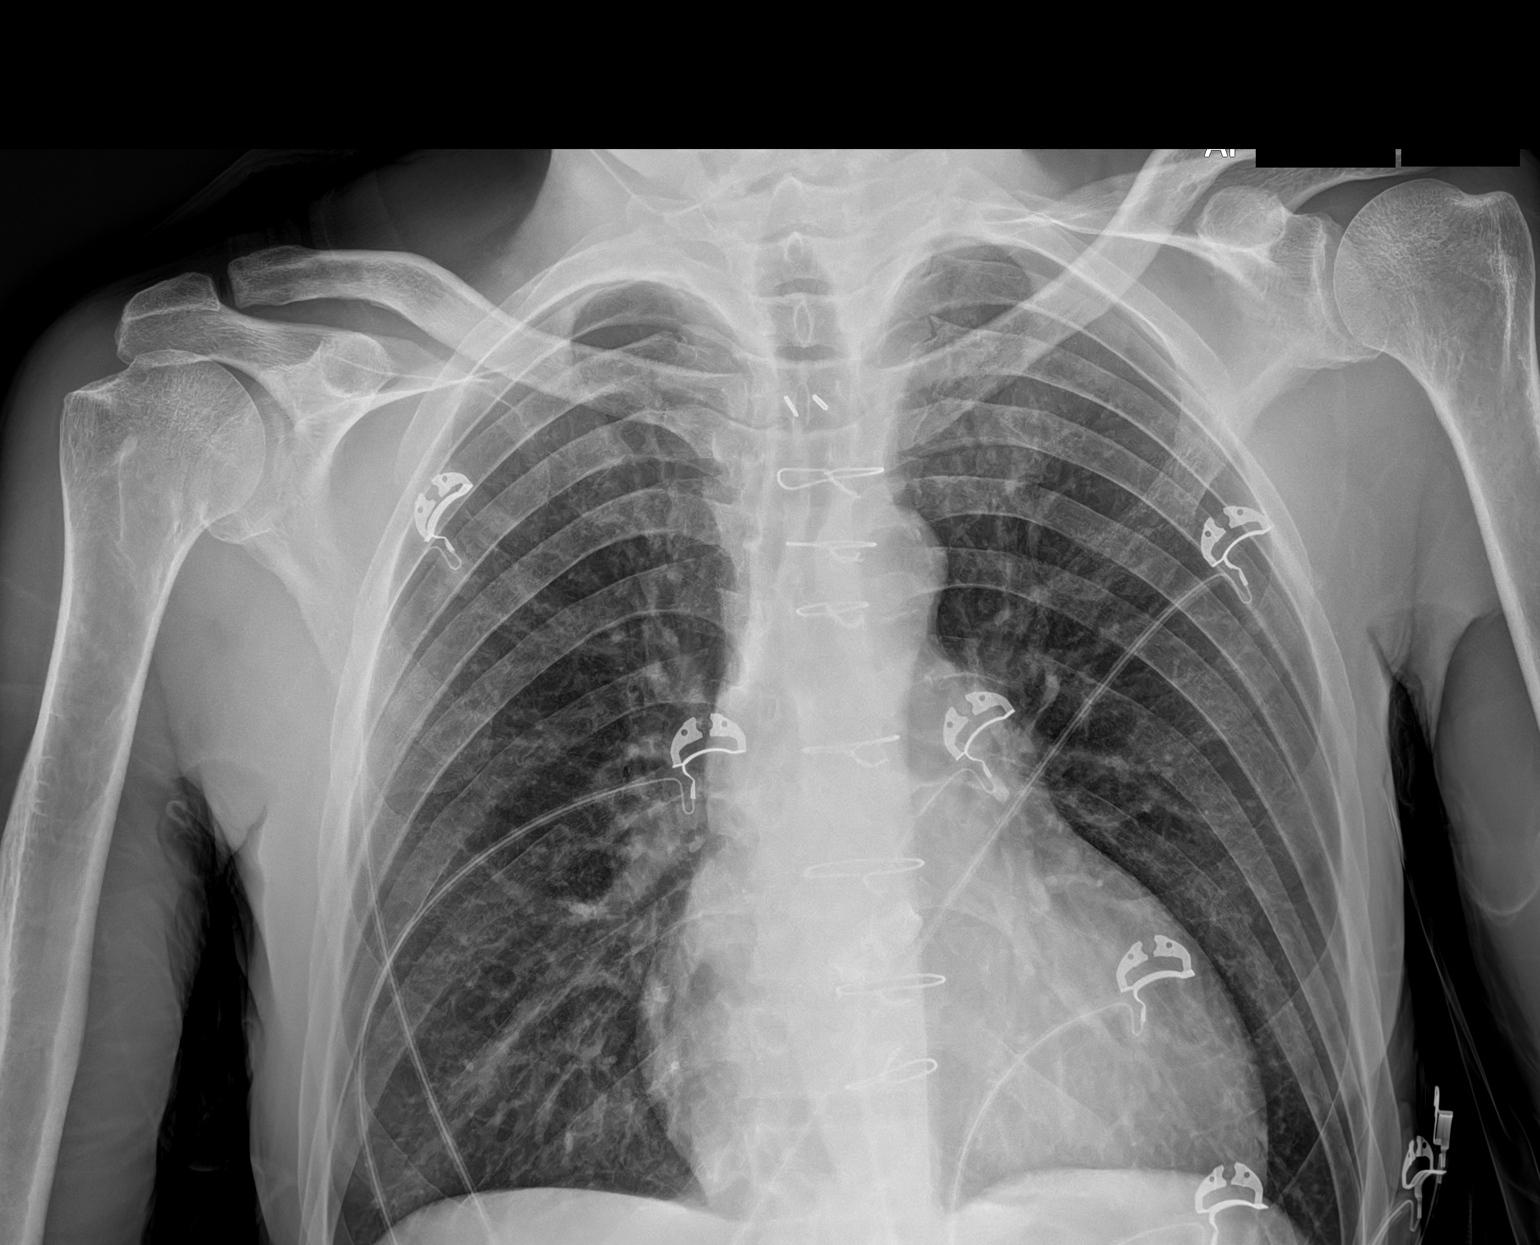

[chest ap (2 of 2)]
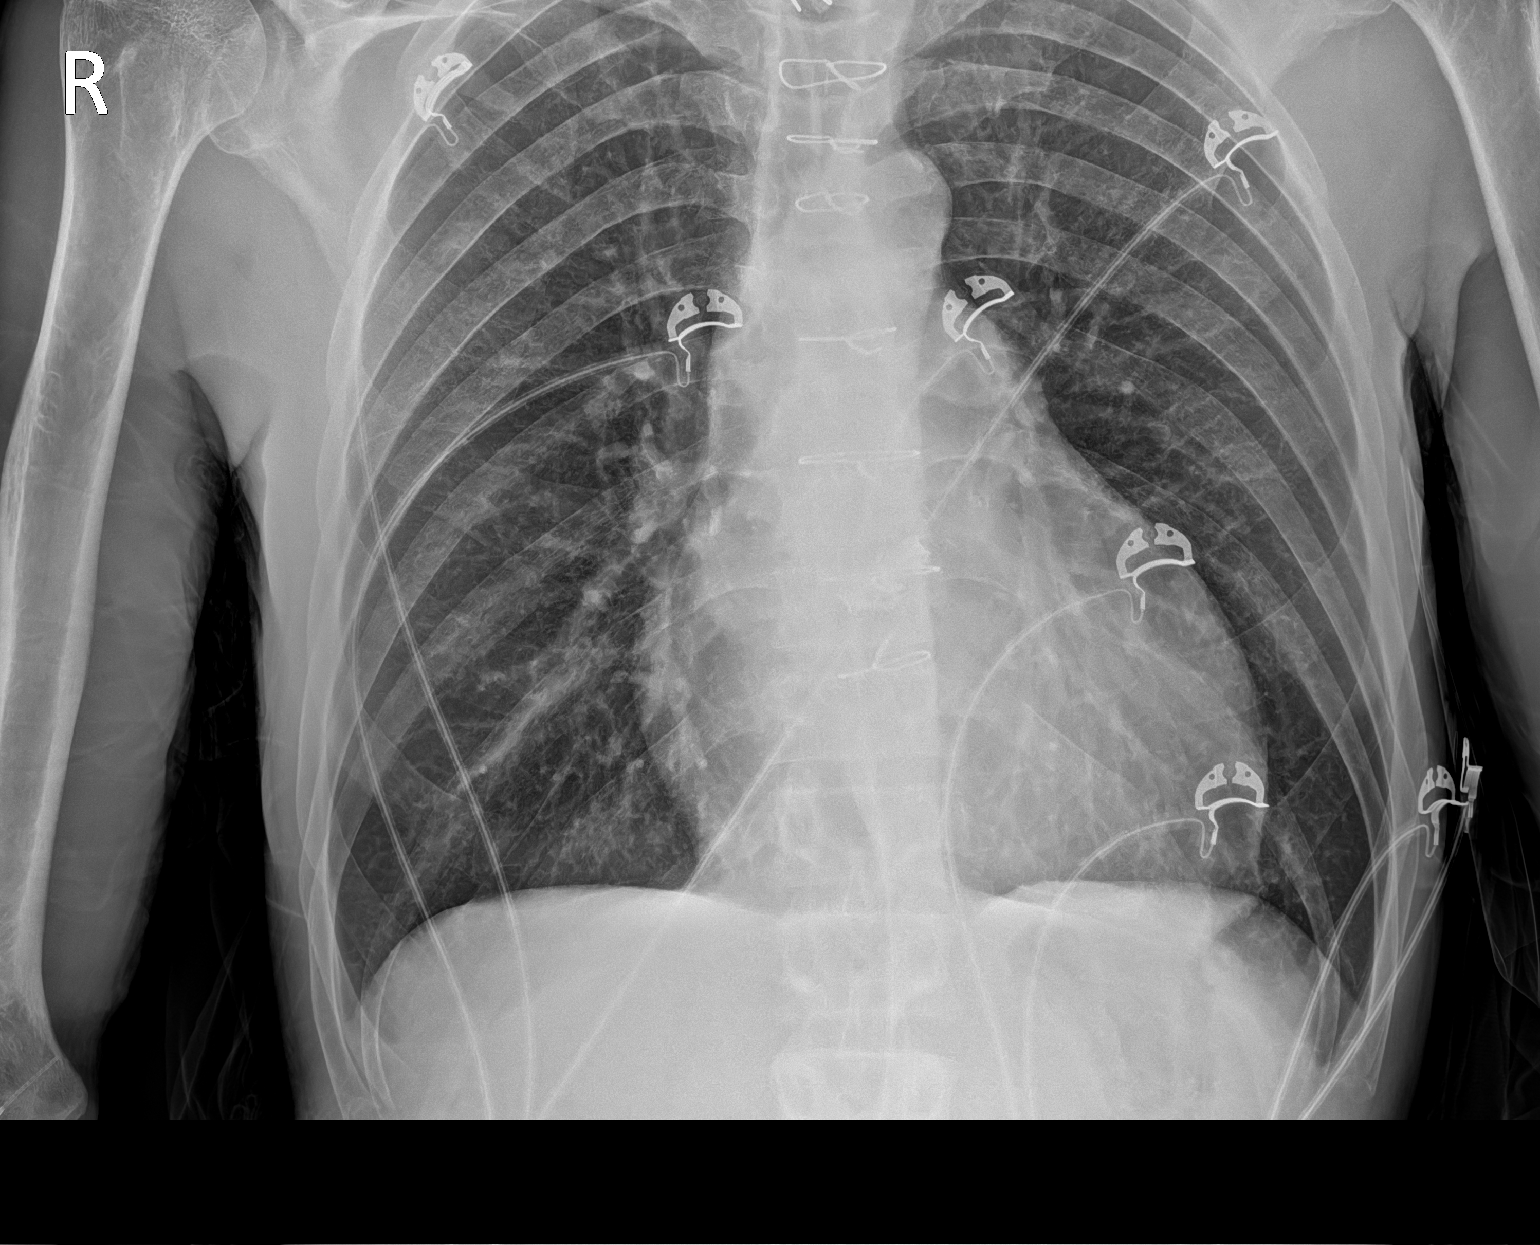

[2 of 2 positions shown; findings below may reference images not displayed]

FINDINGS: Portable AP upright view at 9000 hours. Chronic sternotomy and
cardiac valve replacement. Borderline to mild cardiomegaly appears
stable since 6969. Other mediastinal contours are within normal
limits. Visualized tracheal air column is within normal limits. Lung
volumes at the upper limits of normal. Allowing for portable
technique the lungs are clear. No pneumothorax. No pleural effusion.
Paucity of bowel gas. No acute osseous abnormality identified.
IMPRESSION: Borderline cardiomegaly and pulmonary hyperinflation. No acute
cardiopulmonary abnormality.

## 2023-12-08 ENCOUNTER — Other Ambulatory Visit: Payer: Self-pay | Admitting: Infectious Disease

## 2023-12-08 DIAGNOSIS — B20 Human immunodeficiency virus [HIV] disease: Secondary | ICD-10-CM

## 2023-12-08 NOTE — Telephone Encounter (Signed)
 ART last dispensed 12/08/23. Additional refills to be provided at 10/6 appt.  Caidyn Blossom, BSN, RN

## 2023-12-08 NOTE — Telephone Encounter (Signed)
 Last dispensed 9/29, patient has appointment 10/6.  Doninique Lwin, BSN, RN

## 2023-12-14 NOTE — Progress Notes (Unsigned)
 Subjective:  Chief complaint: follow-up for HIV disease on medications   Patient ID: Brandon [name], male    DOB: 03-10-1966, 58 y.o.   MRN: [id number]  HPI  Past Medical History:  Diagnosis Date   Abdominal pain    Anemia    Aortic atherosclerosis    Arthritis    Back pain 02/13/2016   Constipation    Depression    Diarrhea    Diverticulosis    Family history of CVA 06/27/2021   Foot lesion 12/19/2014   Gallstones    Gastric AVM    GERD (gastroesophageal reflux disease)    GI bleed    HIV (human immunodeficiency virus infection) (HCC)    Hyperlipidemia 12/09/2022   Hypertension    IBS (irritable bowel syndrome)    Infectious colitis    Internal hemorrhoids    Interstitial cystitis    Lymphopenia 05/31/2020   Mechanical heart valve present    Nausea & vomiting    Osteopenia 06/02/2018   Pancreatitis    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   Stroke (HCC)    Weight loss, unintentional     Past Surgical History:  Procedure Laterality Date   AORTIC VALVE REPLACEMENT     CARDIAC SURGERY     CHOLECYSTECTOMY  02/12/2012   Procedure: LAPAROSCOPIC CHOLECYSTECTOMY;  Surgeon: Jina Nephew, MD;  Location: [facility] OR;  Service: General;  Laterality: N/A;   COLONOSCOPY WITH ESOPHAGOGASTRODUODENOSCOPY (EGD)     with polypectomy   ESOPHAGOGASTRODUODENOSCOPY N/A 07/24/2012   Procedure: ESOPHAGOGASTRODUODENOSCOPY (EGD);  Surgeon: Belvie JONETTA Just, MD;  Location: [facility] ENDOSCOPY;  Service: Endoscopy;  Laterality: N/A;   KNEE SURGERY     MULTIPLE EXTRACTIONS WITH ALVEOLOPLASTY N/A 03/13/2018   Procedure: MULTIPLE EXTRACTION;  Surgeon: Sheryle Hamilton, DDS;  Location: [facility] OR;  Service: Oral Surgery;  Laterality: N/A;    Family History  Problem Relation Age of Onset   Hypertension Father    Prostate cancer Father    Stomach cancer Father    Hypertension Sister    Diabetes Maternal Aunt    Parkinson's disease Paternal Aunt    Dementia Paternal Uncle    Dementia Paternal  Uncle    Migraines Paternal Grandmother    Cancer - Other Cousin       Social History   Socioeconomic History   Marital status: Married    Spouse name: Not on file   Number of children: 6   Years of education: Not on file   Highest education level: Not on file  Occupational History   Occupation: disability rep  Tobacco Use   Smoking status: Former    Current packs/day: 0.00    Average packs/day: 0.1 packs/day for 20.0 years (2.0 ttl pk-yrs)    Types: Cigars, Cigarettes    Start date: 08/02/1997    Quit date: 08/02/2017    Years since quitting: 6.3    Passive exposure: Never   Smokeless tobacco: Never  Vaping Use   Vaping status: Some Days   Substances: CBD   Devices: CBD vaping  Substance and Sexual Activity   Alcohol use: Yes    Alcohol/week: 0.0 standard drinks of alcohol    Comment: rarely    Drug use: Yes    Frequency: 7.0 times per week    Types: Marijuana   Sexual activity: Yes    Partners: Female    Birth control/protection: Condom    Comment: declined condoms  Other Topics Concern   Not on file  Social History Narrative  Are you right handed or left handed? Right    Are you currently employed ? 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 NO   Who lives with you?    What type of home do you live in: 1 story or 2 story? 1       Social Drivers of Corporate investment banker Strain: Not on file  Food Insecurity: Low Risk  (11/14/2023)   Received from [facility]   Hunger Vital Sign    Within the past 12 months, you worried that your food would run out before you got money to buy more: Never true    Within the past 12 months, the food you bought just didn't last and you didn't have money to get more. : Never true  Recent Concern: Food Insecurity - Medium Risk (09/01/2023)   Received from [facility]   Hunger Vital Sign    Within the past 12 months, you worried that your food would run out before you got money to  buy more: Sometimes true    Within the past 12 months, the food you bought just didn't last and you didn't have money to get more. : Sometimes true  Transportation Needs: No Transportation Needs (09/01/2023)   Received from [facility]    In the past 12 months, has lack of reliable transportation kept you from medical appointments, meetings, work or from getting things needed for daily living? : No  Physical Activity: Not on file  Stress: Not on file  Social Connections: Not on file    Allergies  Allergen Reactions   Bactrim [Sulfamethoxazole-Trimethoprim]    Bee Venom Anaphylaxis   Sulfa Antibiotics Anaphylaxis   Truvada [Emtricitabine -Tenofovir  Df] Anaphylaxis and Rash    Takes plain tenofovir  at home   Lidoderm  [Lidocaine ] Other (See Comments)    Reaction unknown   Raltegravir      resistance   Ceftriaxone Rash   Sulfamethoxazole Itching, Other (See Comments) and Rash    Other reaction(s): Hypotension (ALLERGY/intolerance)     Current Outpatient Medications:    ARIPiprazole (ABILIFY) 5 MG tablet, Take 1 tablet by mouth at bedtime., Disp: , Rfl:    azithromycin  (ZITHROMAX ) 250 MG tablet, Take 1 tablet (250 mg total) by mouth daily., Disp: 4 tablet, Rfl: 0   colestipol  (COLESTID ) 1 g tablet, Take 1 tablet (1 g total) by mouth 2 (two) times daily., Disp: 30 tablet, Rfl: 0   dolutegravir  (TIVICAY ) 50 MG tablet, Take 1 tablet (50 mg total) by mouth 2 (two) times daily., Disp: 60 tablet, Rfl: 11   emtricitabine -tenofovir  AF (DESCOVY ) 200-25 MG tablet, Take 1 tablet by mouth daily., Disp: 30 tablet, Rfl: 11   EPINEPHrine  (EPI-PEN) 0.3 mg/0.3 mL DEVI, Inject 0.3 mLs (0.3 mg total) into the muscle once., Disp: 1 Device, Rfl: 1   escitalopram  (LEXAPRO ) 20 MG tablet, TAKE 1 TABLET BY MOUTH   DAILY (Patient taking differently: Take 20 mg by mouth daily.), Disp: 30 tablet, Rfl: 1   fostemsavir tromethamine  (RUKOBIA ) 600 MG TB12 ER tablet, Take 1 tablet by mouth every 12  (twelve) hours., Disp: 60 tablet, Rfl: 11   furosemide  (LASIX ) 20 MG tablet, Take 1 tablet (20 mg total) by mouth daily., Disp: 30 tablet, Rfl: 1   LORazepam  (ATIVAN ) 1 MG tablet, Take 1 mg by mouth at bedtime as needed for anxiety or sleep., Disp: , Rfl:    losartan  (COZAAR ) 25 MG  tablet, Take 25 mg by mouth daily., Disp: , Rfl:    metoprolol  succinate (TOPROL -XL) 25 MG 24 hr tablet, Take 25 mg by mouth daily., Disp: , Rfl:    ondansetron  (ZOFRAN ) 8 MG tablet, Take 1 tablet (8 mg total) by mouth every 4 (four) hours as needed for nausea., Disp: 10 tablet, Rfl: 0   ondansetron  (ZOFRAN -ODT) 4 MG disintegrating tablet, Take 1 tablet (4 mg total) by mouth every 8 (eight) hours as needed for nausea or vomiting., Disp: 10 tablet, Rfl: 0   Opium  10 MG/ML (1%) TINC, Take 0.6 mLs (6 mg total) by mouth every 6 (six) hours., Disp: 72 mL, Rfl: 0   polyethylene glycol powder (GLYCOLAX/MIRALAX) 17 GM/SCOOP powder, Take 17 g by mouth daily., Disp: , Rfl:    promethazine  (PHENERGAN ) 25 MG tablet, 1-2 tablets every six hours prn nausea not relieved by zofran , Disp: 100 tablet, Rfl: 3   rosuvastatin  (CRESTOR ) 20 MG tablet, Take 1 tablet (20 mg total) by mouth daily., Disp: 30 tablet, Rfl: 11   sucralfate (CARAFATE) 1 g tablet, Take 1 g by mouth 3 (three) times daily., Disp: , Rfl:    traZODone  (DESYREL ) 50 MG tablet, Take 50 mg by mouth at bedtime. , Disp: , Rfl:    valACYclovir  (VALTREX ) 1000 MG tablet, Take 1 tablet (1,000 mg total) by mouth daily., Disp: 30 tablet, Rfl: 11   warfarin (COUMADIN ) 1 MG tablet, Take 1 mg by mouth every Friday. Take as directed with 5mg  tablet per [facility]mad[facility]  Clinic on Fridays., Disp: , Rfl:    warfarin (COUMADIN ) 4 MG tablet, Take by mouth See admin instructions. Takes 5MG  and 4MG  together (9MG  total) on Sundays and Tuesdays, Disp: , Rfl:    warfarin (COUMADIN ) 5 MG tablet, Take 5 mg by mouth See admin instructions. Takes 5 MG on Monday, Wednesday, Thursday, Friday and Saturday, then 9  MG on Sunday and Tuesday, Disp: , Rfl:    zidovudine  (RETROVIR ) 300 MG tablet, Take 1 tablet (300 mg total) by mouth 2 (two) times daily., Disp: 60 tablet, Rfl: 11 No current facility-administered medications for this visit.  Facility-Administered Medications Ordered in Other Visits:    0.9 %  sodium chloride  infusion, , Intravenous, Once, Eben Reyes BROCKS, MD   Review of Systems     Objective:   Physical Exam        Assessment & Plan:

## 2023-12-15 ENCOUNTER — Ambulatory Visit (INDEPENDENT_AMBULATORY_CARE_PROVIDER_SITE_OTHER): Admitting: Infectious Disease

## 2023-12-15 ENCOUNTER — Other Ambulatory Visit (HOSPITAL_COMMUNITY)
Admission: RE | Admit: 2023-12-15 | Discharge: 2023-12-15 | Disposition: A | Discharge status: Home / Self Care | Source: Ambulatory Visit | Attending: Infectious Disease | Admitting: Infectious Disease

## 2023-12-15 ENCOUNTER — Other Ambulatory Visit: Payer: Self-pay

## 2023-12-15 VITALS — BP 149/83 | HR 86 | Temp 97.3°F | Resp 16 | Wt 124.4 lb

## 2023-12-15 DIAGNOSIS — F339 Major depressive disorder, recurrent, unspecified: Secondary | ICD-10-CM | POA: Insufficient documentation

## 2023-12-15 DIAGNOSIS — Z952 Presence of prosthetic heart valve: Secondary | ICD-10-CM | POA: Diagnosis present

## 2023-12-15 DIAGNOSIS — E785 Hyperlipidemia, unspecified: Secondary | ICD-10-CM | POA: Diagnosis not present

## 2023-12-15 DIAGNOSIS — Z23 Encounter for immunization: Secondary | ICD-10-CM | POA: Diagnosis not present

## 2023-12-15 DIAGNOSIS — R1115 Cyclical vomiting syndrome unrelated to migraine: Secondary | ICD-10-CM | POA: Diagnosis present

## 2023-12-15 DIAGNOSIS — B20 Human immunodeficiency virus [HIV] disease: Secondary | ICD-10-CM | POA: Diagnosis not present

## 2023-12-15 MED ORDER — TIVICAY 50 MG PO TABS: 50.0000 mg | ORAL_TABLET | Freq: Two times a day (BID) | ORAL | 11 refills | Status: AC

## 2023-12-15 MED ORDER — ROSUVASTATIN CALCIUM 20 MG PO TABS: 20.0000 mg | ORAL_TABLET | Freq: Every day | ORAL | 11 refills | Status: AC

## 2023-12-15 MED ORDER — ZIDOVUDINE 300 MG PO TABS: 300.0000 mg | ORAL_TABLET | Freq: Two times a day (BID) | ORAL | 11 refills | Status: DC

## 2023-12-15 MED ORDER — RUKOBIA 600 MG PO TB12: ORAL_TABLET | ORAL | 11 refills | Status: AC

## 2023-12-15 MED ORDER — DESCOVY 200-25 MG PO TABS
1.0000 | ORAL_TABLET | Freq: Every day | ORAL | 11 refills | Status: DC
Start: 2023-12-15 — End: 2024-01-15

## 2023-12-15 MED ORDER — VALACYCLOVIR HCL 1 G PO TABS: 1000.0000 mg | ORAL_TABLET | Freq: Every day | ORAL | 11 refills | Status: AC

## 2023-12-16 LAB — URINE CYTOLOGY ANCILLARY ONLY
Chlamydia: NEGATIVE
Comment: NEGATIVE
Comment: NORMAL
Neisseria Gonorrhea: NEGATIVE

## 2023-12-16 LAB — T-HELPER CELLS (CD4) COUNT (NOT AT ARMC)
CD4 % Helper T Cell: 20 % — ABNORMAL LOW (ref 33–65)
CD4 T Cell Abs: 298 /uL — ABNORMAL LOW (ref 400–1790)

## 2023-12-18 ENCOUNTER — Ambulatory Visit: Payer: Self-pay

## 2023-12-18 NOTE — Progress Notes (Signed)
 Thank you :)

## 2023-12-18 NOTE — Telephone Encounter (Signed)
 Spoke with Genette, discussed elevated viral load and he is agreeable with scheduling a pharmacy visit in one month to check in on medication tolerability. Scheduled with Alan.   Ksenia Kunz, BSN, RN

## 2023-12-22 LAB — LIPID PANEL
Cholesterol: 143 mg/dL (ref <200)
HDL: 40 mg/dL (ref >=40)
LDL Cholesterol (Calc): 80 mg/dL
Non-HDL Cholesterol (Calc): 103 mg/dL (ref <130)
Total CHOL/HDL Ratio: 3.6 (calc) (ref <5.0)
Triglycerides: 126 mg/dL (ref <150)

## 2023-12-22 LAB — HIV-1 INTEGRASE GENOTYPE

## 2023-12-22 LAB — HIV RNA, RTPCR W/R GT (RTI, PI,INT)
HIV 1 RNA Quant: 83300 {copies}/mL — ABNORMAL HIGH
HIV-1 RNA Quant, Log: 4.92 {Log_copies}/mL — ABNORMAL HIGH

## 2023-12-22 LAB — RPR: RPR Ser Ql: NONREACTIVE

## 2023-12-22 LAB — HIV-1 GENOTYPE: HIV-1 Genotype: DETECTED — AB

## 2024-01-14 NOTE — Progress Notes (Signed)
 HPI: Brandon [name] is a 58 y.o. male who presents to the [facility] pharmacy clinic for HIV follow-up.  Referring ID Provider: Dr. 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   Protein-calorie malnutrition, severe 08/24/2013   Diarrhea 08/23/2013   Gastroenteritis 08/05/2013   Depression 03/17/2013   Sinus congestion 03/17/2013   Odynophagia 07/24/2012   Persistent vomiting 07/24/2012   Candida esophagitis (HCC) 07/23/2012   Intractable nausea and vomiting 07/23/2012   Adjustment disorder with mixed anxiety and depressed mood 05/14/2012   Genital herpes 05/14/2012   Long term (current) use of anticoagulants 05/14/2012   cerebral artey occ 05/14/2012   Cerebral artery occlusion with cerebral infarction (HCC) 05/14/2012   Chronic pancreatitis (HCC) 02/20/2012   Hypokalemia 02/20/2012   Acute pancreatitis 02/09/2012   Nausea 02/09/2012   Abdominal pain 02/09/2012   Anemia 02/09/2012   S/P AVR (aortic valve replacement) 02/09/2012   Septic arthritis of knee (HCC) 02/09/2012   Warfarin-induced coagulopathy 02/09/2012   AIDS (HCC) 02/09/2012    Patient's Medications  New Prescriptions   No medications on file  Previous Medications   ARIPIPRAZOLE (ABILIFY) 5 MG TABLET    Take 1 tablet by mouth at bedtime.   AZITHROMYCIN  (ZITHROMAX ) 250 MG TABLET    Take 1 tablet (250 mg total) by mouth daily.    COLESTIPOL  (COLESTID ) 1 G TABLET    Take 1 tablet (1 g total) by mouth 2 (two) times daily.   DOLUTEGRAVIR  (TIVICAY ) 50 MG TABLET    Take 1 tablet (50 mg total) by mouth 2 (two) times daily.   EMTRICITABINE -TENOFOVIR  AF (DESCOVY ) 200-25 MG TABLET    Take 1 tablet by mouth daily.   EPINEPHRINE  (EPI-PEN) 0.3 MG/0.3 ML DEVI    Inject 0.3 mLs (0.3 mg total) into the muscle once.   ESCITALOPRAM  (LEXAPRO ) 20 MG TABLET    TAKE 1 TABLET BY MOUTH   DAILY   FOSTEMSAVIR TROMETHAMINE  (RUKOBIA ) 600 MG TB12 ER TABLET    Take 1 tablet by mouth every 12 (twelve) hours.   FUROSEMIDE  (LASIX ) 20 MG TABLET    Take 1 tablet (20 mg total) by mouth daily.   LORAZEPAM  (ATIVAN ) 1 MG TABLET    Take 1 mg by mouth at bedtime as needed for anxiety or sleep.   LOSARTAN  (COZAAR ) 25 MG TABLET    Take 25 mg by mouth daily.   METOPROLOL  SUCCINATE (TOPROL -XL) 25 MG 24 HR TABLET    Take 25 mg by mouth daily.   ONDANSETRON  (ZOFRAN ) 8 MG TABLET    Take 1 tablet (8 mg total) by mouth every 4 (four) hours as needed for nausea.   ONDANSETRON  (ZOFRAN -ODT) 4 MG DISINTEGRATING TABLET    Take 1 tablet (4 mg total) by mouth every 8 (eight) hours as needed for nausea or vomiting.   OPIUM  10 MG/ML (1%) TINC    Take 0.6 mLs (6 mg total) by mouth every 6 (six)  hours.   POLYETHYLENE GLYCOL POWDER (GLYCOLAX/MIRALAX) 17 GM/SCOOP POWDER    Take 17 g by mouth daily.   PROMETHAZINE  (PHENERGAN ) 25 MG TABLET    1-2 tablets every six hours prn nausea not relieved by zofran    ROSUVASTATIN  (CRESTOR ) 20 MG TABLET    Take 1 tablet (20 mg total) by mouth daily.   SUCRALFATE (CARAFATE) 1 G TABLET    Take 1 g by mouth 3 (three) times daily.   TRAZODONE  (DESYREL ) 50 MG TABLET    Take 50 mg by mouth at bedtime.    VALACYCLOVIR  (VALTREX ) 1000 MG TABLET    Take 1 tablet (1,000 mg total) by mouth daily.   WARFARIN (COUMADIN ) 1 MG TABLET    Take 1 mg by mouth every Friday. Take as directed with 5mg  tablet per Coumadin  Clinic on Fridays.   WARFARIN (COUMADIN ) 4 MG  TABLET    Take by mouth See admin instructions. Takes 5MG  and 4MG  together (9MG  total) on Sundays and Tuesdays   WARFARIN (COUMADIN ) 5 MG TABLET    Take 5 mg by mouth See admin instructions. Takes 5 MG on Monday, Wednesday, Thursday, Friday and Saturday, then 9 MG on Sunday and Tuesday   ZIDOVUDINE  (RETROVIR ) 300 MG TABLET    Take 1 tablet (300 mg total) by mouth 2 (two) times daily.  Modified Medications   No medications on file  Discontinued Medications   No medications on file    Labs: Lab Results  Component Value Date   HIV1RNAQUANT 83,300 (H) 12/15/2023   HIV1RNAQUANT <20 (H) 05/12/2023   HIV1RNAQUANT NOT DETECTED 12/09/2022   CD4TABS 298 (L) 12/15/2023   CD4TABS 434 05/12/2023   CD4TABS 621 12/09/2022    RPR and STI Lab Results  Component Value Date   LABRPR NON-REACTIVE 12/15/2023   LABRPR NON-REACTIVE 05/12/2023   LABRPR NON-REACTIVE 12/09/2022   LABRPR NON-REACTIVE 06/05/2022   LABRPR NON-REACTIVE 04/01/2022   RPRTITER 1:1 (H) 11/28/2017    STI Results GC CT  12/15/2023  3:41 PM Negative  Negative   12/09/2022  3:41 PM Negative  Negative   06/05/2022  4:13 PM Negative  Negative   04/01/2022  3:34 PM Negative  Negative   12/31/2021  3:59 PM Negative  Negative   12/01/2019  4:21 PM Negative  Negative   05/14/2018 12:00 AM Negative  Negative   02/13/2016 12:00 AM Negative  Negative   03/31/2014 12:00 AM NG: Negative  CT: Negative   01/26/2014 12:00 AM NG: Negative  CT: Negative     Hepatitis B Lab Results  Component Value Date   HEPBSAB NONREACTIVE 05/14/2012   HEPBSAG NEGATIVE 05/14/2012   HEPBCAB NEG 05/14/2012   Hepatitis C No results found for: HEPCAB, HCVRNAPCRQN Hepatitis A Lab Results  Component Value Date   HAV POS (A) 05/14/2012   Lipids: Lab Results  Component Value Date   CHOL 143 12/15/2023   TRIG 126 12/15/2023   HDL 40 12/15/2023   CHOLHDL 3.6 12/15/2023   VLDL 17 07/18/2014   LDLCALC 80 12/15/2023    Current HIV  Regimen: Dolutegravir  (Tivicay ) + Descovy  (emtricitabine /TAF) + fostemsavir (Rukobia ) + Zidovudine  (Retrovir )   Assessment: Brandon [name] presents today for follow up on tolerance of HIV medications in the setting of persistent nausea and vomiting. Upon chart review, patient has experienced persistent nausea and vomiting for months despite having anti-nausea medications such as Zofran  and Phenergan . Patient reports continued nausea since last office visit (12/15/2023) but has overall improved when the patient eats a snack. Also  reports that vomiting is less frequent since last visit. He reports no missed doses of HIV antiretroviral medications since last office visit.   Interpretation of genotype data per [employer] HIV drug resistance database shows the following: - High-level of resistance to NRTIs, therefore, Descovy  and Retrovir  are not active agents - Intermediate dolutegravir  resistance, therefore, should be administered twice daily  - Intermediate darunavir , therefore, should be administered twice daily   Discussed two potential regimen options with the patient:  - Option #1 - Tivicay  BID + Prezista  BID + Norvir  BID + Rukobia  BID - Option #2 - Tivicay  BID + Rukobia  BID + lenacapavir injections  Discussed that lenacapavir is a twice-yearly subcutaneous injection involving two injections in the abdomen every 6 months. Initial injections consists of oral lenacapavir 600 mg daily for two days, followed by maintenance injections every 6 months. Counseled on injection site pain/nodules surrounding the abdomen which can be relieved with ice pack/heat pack and OTC pain relievers. Patient states that his main concern would be bruising around the injection site in the setting of being on warfarin. For now, he states that he would like to pursue option #1 and take some more time to think and discuss with his wife regarding lenacapavir. Patient informed that if he decides he would like to pursue with lenacapavir  injections, it may take at least 2 weeks to get insurance approval and receive the medication.   Aside from infectious disease management, noticed patient's recent pharmacy dispense history consisting of losartan  and Entresto which are contraindicated when taken together. Patient states that he is taking losartan  but was unfamiliar with Entresto and will check on this when he returns home. Counseled patient that these should not be taken together and to follow up with cardiology team at [facility] to determine which agent he should be taking.   Labs:  HIV RNA with reflex to genotype   Eligible vaccinations: Tdap, Shingrix 1/2  - Patient politely declines vaccines today   Plan: - Stop Descovy  + Retrovir  - Start Prezista  600 mg BID + Norvir  100 mg BID and continue Tivicay  50 mg BID + Rukobia  600 mg BID - HIV RNA today  - Follow up on 02/10/2024 with Alan, then with Dr. Fleeta [name] on 03/01/2024  Feliciano Close, PharmD PGY2 Infectious Diseases Pharmacy Resident

## 2024-01-15 ENCOUNTER — Other Ambulatory Visit: Payer: Self-pay

## 2024-01-15 ENCOUNTER — Ambulatory Visit (INDEPENDENT_AMBULATORY_CARE_PROVIDER_SITE_OTHER): Admitting: Pharmacist

## 2024-01-15 ENCOUNTER — Telehealth: Payer: Self-pay | Admitting: Pharmacist

## 2024-01-15 DIAGNOSIS — Z113 Encounter for screening for infections with a predominantly sexual mode of transmission: Secondary | ICD-10-CM

## 2024-01-15 DIAGNOSIS — B20 Human immunodeficiency virus [HIV] disease: Secondary | ICD-10-CM

## 2024-01-15 MED ORDER — DARUNAVIR 600 MG PO TABS: 600.0000 mg | ORAL_TABLET | Freq: Two times a day (BID) | ORAL | 1 refills | Status: DC

## 2024-01-15 MED ORDER — RITONAVIR 100 MG PO TABS: 100.0000 mg | ORAL_TABLET | Freq: Two times a day (BID) | ORAL | 1 refills | Status: DC

## 2024-01-15 NOTE — Telephone Encounter (Signed)
 Cumulative HIV Genotype Data  Genotype Dates: 02/09/2012 ([facility]), 05/14/2012, 08/14/2015, 02/13/2016, 01/03/2022  RT Mutations M41L, D67N, L74LV, M184V, L210W, T215DY, L100I, K101E, K103N, Y181YC, G190S, T69I, R211RK  PI Mutations I13IV, K20KIM, D60E, I62V, L63P, A71AV, V77I, M46I, I47V, I50L, I54IV, V82A, I84IV, L90M, L10EFI, F53L, T74TP  Integrase Mutations  G140S, Q148H, M50I   Interpretation of Genotype Data per [employer] HIV Drug Resistance Database:  Nucleoside RTIs  Abacavir - High-level resistance Zidovudine  - High-level resistance Emtricitabine  - High-level resistance Lamivudine  - High-level resistance Tenofovir  - High-level resistance   Non-Nucleoside RTIs  Doravirine - High-level resistance Efavirenz - High-level resistance Etravirine - High-level resistance Nevirapine - High-level resistance Rilpivirine - High-level resistance   Protease Inhibitors  Atazanavir - High-level resistance Darunavir  - Intermediate resistance Lopinavir - High-level resistance   Integrase Inhibitors  Bictegravir - Intermediate resistance Cabotegravir - High-level resistance Dolutegravir  -Intermediate resistance Elvitegravir - High-level resistance Raltegravir  - High-level resistance   There is evidence for intermediate DTG resistance. If DTG is used, it should be administered twice daily. There is evidence for intermediate DRV resistance. If DRV is administered it should be used twice daily.  Alan Geralds, PharmD, CPP, BCIDP, AAHIVP Clinical Pharmacist Practitioner Infectious Diseases Clinical Pharmacist [facility]

## 2024-01-15 NOTE — Patient Instructions (Signed)
 TAKE darunavir  + ritonavir  + Rukobia  + Tivicay  TWICE daily  STOP taking zidovudine  + Descovy    ALWAYS take ALL FOUR tablets together  Call us  at [phone] with any questions!   GLENWOOD Maffucci

## 2024-01-18 LAB — HIV RNA, RTPCR W/R GT (RTI, PI,INT)
HIV 1 RNA Quant: 79 {copies}/mL — ABNORMAL HIGH
HIV-1 RNA Quant, Log: 1.9 {Log_copies}/mL — ABNORMAL HIGH

## 2024-02-09 NOTE — Progress Notes (Unsigned)
 HPI: Brandon [name] is a 58 y.o. male who presents to the [facility] pharmacy clinic for HIV follow-up.  Referring ID Provider: Dr. Fleeta Rothman   Patient Active Problem List   Diagnosis Date Noted   Hyperlipidemia 12/09/2022   Vaccine counseling 04/01/2022   Family history of CVA 06/27/2021   Lymphopenia 05/31/2020   Encounter for long-term (current) use of medications 04/05/2019   Osteopenia 06/02/2018   HIV disease (HCC) 11/28/2017   Back pain 02/13/2016   Foot lesion 12/19/2014   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   Thumb pain 03/31/2014   Left hand weakness 03/31/2014   Opiate dependence (HCC) 03/31/2014   Major depression, recurrent, chronic 03/31/2014   Cyclic vomiting syndrome 03/31/2014   Alleged drug diversion 01/31/2014   Protein-calorie malnutrition, severe 08/24/2013   Diarrhea 08/23/2013   Gastroenteritis 08/05/2013   Depression 03/17/2013   Sinus congestion 03/17/2013   Odynophagia 07/24/2012   Persistent vomiting 07/24/2012   Candida esophagitis (HCC) 07/23/2012   Intractable nausea and vomiting 07/23/2012   Adjustment disorder with mixed anxiety and depressed mood 05/14/2012   Genital herpes 05/14/2012   Long term (current) use of anticoagulants 05/14/2012   cerebral artey occ 05/14/2012   Cerebral artery occlusion with cerebral infarction (HCC) 05/14/2012   Chronic pancreatitis (HCC) 02/20/2012   Hypokalemia 02/20/2012   Acute pancreatitis 02/09/2012   Nausea 02/09/2012   Abdominal pain 02/09/2012   Anemia 02/09/2012   S/P AVR (aortic valve replacement) 02/09/2012   Septic arthritis of knee (HCC) 02/09/2012   Warfarin-induced coagulopathy 02/09/2012   AIDS (HCC) 02/09/2012    Patient's Medications  New Prescriptions   No medications on file  Previous Medications   ARIPIPRAZOLE (ABILIFY) 5 MG TABLET    Take 1 tablet by mouth at bedtime.   COLESTIPOL  (COLESTID ) 1 G TABLET    Take 1 tablet (1 g total) by mouth 2 (two) times daily.    DARUNAVIR  (PREZISTA ) 600 MG TABLET    Take 1 tablet (600 mg total) by mouth 2 (two) times daily with a meal.   DOLUTEGRAVIR  (TIVICAY ) 50 MG TABLET    Take 1 tablet (50 mg total) by mouth 2 (two) times daily.   EPINEPHRINE  (EPI-PEN) 0.3 MG/0.3 ML DEVI    Inject 0.3 mLs (0.3 mg total) into the muscle once.   ESCITALOPRAM  (LEXAPRO ) 20 MG TABLET    TAKE 1 TABLET BY MOUTH   DAILY   FOSTEMSAVIR TROMETHAMINE  (RUKOBIA ) 600 MG TB12 ER TABLET    Take 1 tablet by mouth every 12 (twelve) hours.   FUROSEMIDE  (LASIX ) 20 MG TABLET    Take 1 tablet (20 mg total) by mouth daily.   LORAZEPAM  (ATIVAN ) 1 MG TABLET    Take 1 mg by mouth at bedtime as needed for anxiety or sleep.   LOSARTAN  (COZAAR ) 25 MG TABLET    Take 25 mg by mouth daily.   METOPROLOL  SUCCINATE (TOPROL -XL) 25 MG 24 HR TABLET    Take 25 mg by mouth daily.   ONDANSETRON  (ZOFRAN ) 8 MG TABLET    Take 1 tablet (8 mg total) by mouth every 4 (four) hours as needed for nausea.   ONDANSETRON  (ZOFRAN -ODT) 4 MG DISINTEGRATING TABLET    Take 1 tablet (4 mg total) by mouth every 8 (eight) hours as needed for nausea or vomiting.   POLYETHYLENE GLYCOL POWDER (GLYCOLAX/MIRALAX) 17 GM/SCOOP POWDER    Take 17 g by mouth daily.   PROMETHAZINE  (PHENERGAN ) 25 MG TABLET    1-2 tablets every six  hours prn nausea not relieved by zofran    RITONAVIR  (NORVIR ) 100 MG TABS TABLET    Take 1 tablet (100 mg total) by mouth 2 (two) times daily with a meal.   ROSUVASTATIN  (CRESTOR ) 20 MG TABLET    Take 1 tablet (20 mg total) by mouth daily.   SUCRALFATE (CARAFATE) 1 G TABLET    Take 1 g by mouth 3 (three) times daily.   TRAZODONE  (DESYREL ) 50 MG TABLET    Take 50 mg by mouth at bedtime.    VALACYCLOVIR  (VALTREX ) 1000 MG TABLET    Take 1 tablet (1,000 mg total) by mouth daily.   WARFARIN (COUMADIN ) 1 MG TABLET    Take 1 mg by mouth every Friday. Take as directed with 5mg  tablet per Coumadin  Clinic on Fridays.   WARFARIN (COUMADIN ) 4 MG TABLET    Take by mouth See admin instructions.  Takes 5MG  and 4MG  together (9MG  total) on Sundays and Tuesdays   WARFARIN (COUMADIN ) 5 MG TABLET    Take 5 mg by mouth See admin instructions. Takes 5 MG on Monday, Wednesday, Thursday, Friday and Saturday, then 9 MG on Sunday and Tuesday  Modified Medications   No medications on file  Discontinued Medications   No medications on file    Labs: Lab Results  Component Value Date   HIV1RNAQUANT 79 (H) 01/15/2024   HIV1RNAQUANT 83,300 (H) 12/15/2023   HIV1RNAQUANT <20 (H) 05/12/2023   CD4TABS 298 (L) 12/15/2023   CD4TABS 434 05/12/2023   CD4TABS 621 12/09/2022    RPR and STI Lab Results  Component Value Date   LABRPR NON-REACTIVE 12/15/2023   LABRPR NON-REACTIVE 05/12/2023   LABRPR NON-REACTIVE 12/09/2022   LABRPR NON-REACTIVE 06/05/2022   LABRPR NON-REACTIVE 04/01/2022   RPRTITER 1:1 (H) 11/28/2017    STI Results GC CT  12/15/2023  3:41 PM Negative  Negative   12/09/2022  3:41 PM Negative  Negative   06/05/2022  4:13 PM Negative  Negative   04/01/2022  3:34 PM Negative  Negative   12/31/2021  3:59 PM Negative  Negative   12/01/2019  4:21 PM Negative  Negative   05/14/2018 12:00 AM Negative  Negative   02/13/2016 12:00 AM Negative  Negative   03/31/2014 12:00 AM NG: Negative  CT: Negative   01/26/2014 12:00 AM NG: Negative  CT: Negative     Hepatitis B Lab Results  Component Value Date   HEPBSAB NONREACTIVE 05/14/2012   HEPBSAG NEGATIVE 05/14/2012   HEPBCAB NEG 05/14/2012   Hepatitis C No results found for: HEPCAB, HCVRNAPCRQN Hepatitis A Lab Results  Component Value Date   HAV POS (A) 05/14/2012   Lipids: Lab Results  Component Value Date   CHOL 143 12/15/2023   TRIG 126 12/15/2023   HDL 40 12/15/2023   CHOLHDL 3.6 12/15/2023   VLDL 17 07/18/2014   LDLCALC 80 12/15/2023    Current HIV Regimen: Prezista  (darunavir ) 600 mg BID + Norvir  (ritonavir ) 100 mg BID + Tivicay  (dolutegravir ) 50 mg BID + Rukobia  (fostemsavir) 600 mg BID    Assessment: Brandon [name] presents today for follow up on tolerance of HIV medications that were adjusted during last visit with Brandon [name] and myself on 01/15/2024. During previous visit, patient was instructed to stop Descovy  + Retrovir  and to start Prezista  + Norvir  while continuing Tivicay  + Rukobia  in efforts to improve viral suppression. During previous visit, patient reported that his persistent nausea and vomiting was overall improved (especially when eating a snack). Last HIV RNA was numerically improved at  79 copies//mL (01/15/2024) compared to 83,300 copies/mL on 12/15/2023.   Today, patient reports doing okay overall. States that his persistent nausea/vomiting has stayed the same since last visit and has not worsened. Noted no fill history on Norvir  and Prezista , however, patient states that he received the medications from the pharmacy via delivery to his home address. He reports taking his previous regimen (Tivicay  + Rukobia  + Descovy  + Retrovir ) with a couple missed doses until his prescriptions were delivered about two weeks ago. He reports tolerating the new regimen (Tivicay  + Rukobia  + Prezista  + Norvir ) without any missed doses over the past two weeks. When asked if he thought any more about lenacapavir, he states that he does not want to pursue injections at this time and wants to continue current oral regimen.   Additionally, noted during last office visit the pharmacy has been filling Entresto + losartan  which I informed the patient was a contraindication. Patient still unsure which one he should be taking and will clarify with cardiology team. Based on recent fill history, I also noticed that the pharmacy alternates between filling losartan  25 mg BID and losartan  100 mg daily. Patient reports that he thinks he should be on losartan  100 mg daily and will clarify with cardiology team.   I called [facility] and spoke to [place], [facility], and he states that they never filled or delivered Prezista   + Norvir  since patient previously filled Descovy  + Retrovir  last month. Pharmacist has now discontinued Descovy  and Retrovir  from patient's medication list and will fill Prezista  + Norvir . Regarding losartan  25 mg BID vs losartan  100 mg daily, pharmacist states that patient received losartan  100 mg recently. Pharmacist confirms that patient has recently been picking up both losartan  and Entresto and I have informed the pharmacist to not fill both medications until patient received clarification from cardiology team on which medication to fill. Appears there are multiple providers prescribing cardiology medications for the patient which appears to have caused some confusion.   Labs:  - HIV RNA with reflex to genotype   Eligible vaccinations: Tdap, Shingrix 1/2  - Patient politely declines vaccines today  Plan: - HIV RNA with reflex to genotype  - Follow up on 03/01/2024 with Dr. Fleeta Rothman   Feliciano Close, PharmD PGY2 Infectious Diseases Pharmacy Resident

## 2024-02-10 ENCOUNTER — Other Ambulatory Visit: Payer: Self-pay

## 2024-02-10 ENCOUNTER — Ambulatory Visit: Admitting: Pharmacist

## 2024-02-10 DIAGNOSIS — B20 Human immunodeficiency virus [HIV] disease: Secondary | ICD-10-CM

## 2024-02-19 LAB — HIV RNA, RTPCR W/R GT (RTI, PI,INT)
HIV 1 RNA Quant: 84700 {copies}/mL — ABNORMAL HIGH
HIV-1 RNA Quant, Log: 4.93 {Log_copies}/mL — ABNORMAL HIGH

## 2024-02-19 LAB — HIV-1 INTEGRASE GENOTYPE

## 2024-02-19 LAB — HIV-1 GENOTYPE: HIV-1 Genotype: DETECTED — AB

## 2024-02-29 NOTE — Progress Notes (Unsigned)
 "  Subjective:  Chief complaint: follow-up for HIV disease on medications   Patient ID: Brandon [name], male    DOB: [birth year], 58 y.o.   MRN: [id number]  HPI  Past Medical History:  Diagnosis Date   Abdominal pain    Anemia    Aortic atherosclerosis    Arthritis    Back pain 02/13/2016   Constipation    Depression    Diarrhea    Diverticulosis    Family history of CVA 06/27/2021   Foot lesion 12/19/2014   Gallstones    Gastric AVM    GERD (gastroesophageal reflux disease)    GI bleed    HIV (human immunodeficiency virus infection) (HCC)    Hyperlipidemia 12/09/2022   Hypertension    IBS (irritable bowel syndrome)    Infectious colitis    Internal hemorrhoids    Interstitial cystitis    Lymphopenia 05/31/2020   Mechanical heart valve present    Nausea & vomiting    Osteopenia 06/02/2018   Pancreatitis    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   Stroke (HCC)    Weight loss, unintentional     Past Surgical History:  Procedure Laterality Date   AORTIC VALVE REPLACEMENT     CARDIAC SURGERY     CHOLECYSTECTOMY  02/12/2012   Procedure: LAPAROSCOPIC CHOLECYSTECTOMY;  Surgeon: Jina Nephew, MD;  Location: [facility] OR;  Service: General;  Laterality: N/A;   COLONOSCOPY WITH ESOPHAGOGASTRODUODENOSCOPY (EGD)     with polypectomy   ESOPHAGOGASTRODUODENOSCOPY N/A 07/24/2012   Procedure: ESOPHAGOGASTRODUODENOSCOPY (EGD);  Surgeon: Belvie JONETTA Just, MD;  Location: [facility] ENDOSCOPY;  Service: Endoscopy;  Laterality: N/A;   KNEE SURGERY     MULTIPLE EXTRACTIONS WITH ALVEOLOPLASTY N/A 03/13/2018   Procedure: MULTIPLE EXTRACTION;  Surgeon: Sheryle Hamilton, DDS;  Location: [facility] OR;  Service: Oral Surgery;  Laterality: N/A;    Family History  Problem Relation Age of Onset   Hypertension Father    Prostate cancer Father    Stomach cancer Father    Hypertension Sister    Diabetes Maternal Aunt    Parkinson's disease Paternal Aunt    Dementia Paternal Uncle    Dementia Paternal  Uncle    Migraines Paternal Grandmother    Cancer - Other Cousin       Social History   Socioeconomic History   Marital status: Married    Spouse name: Not on file   Number of children: 6   Years of education: Not on file   Highest education level: Not on file  Occupational History   Occupation: disability rep  Tobacco Use   Smoking status: Former    Current packs/day: 0.00    Average packs/day: 0.1 packs/day for 20.0 years (2.0 ttl pk-yrs)    Types: Cigars, Cigarettes    Start date: 08/02/1997    Quit date: 08/02/2017    Years since quitting: 6.5    Passive exposure: Never   Smokeless tobacco: Never  Vaping Use   Vaping status: Some Days   Substances: CBD   Devices: CBD vaping  Substance and Sexual Activity   Alcohol use: Yes    Alcohol/week: 0.0 standard drinks of alcohol    Comment: rarely    Drug use: Yes    Frequency: 7.0 times per week    Types: Marijuana   Sexual activity: Yes    Partners: Female    Birth control/protection: Condom    Comment: declined condoms  Other Topics Concern   Not on file  Social History Narrative  Are you right handed or left handed? Right    Are you currently employed ? 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 NO   Who lives with you?    What type of home do you live in: 1 story or 2 story? 1       Social Drivers of Health   Tobacco Use: Medium Risk (01/08/2024)   Received from [facility]   Patient History    Smoking Tobacco Use: Former    Smokeless Tobacco Use: Former    Passive Exposure: Not on Actuary Strain: Not on file  Food Insecurity: Low Risk (11/14/2023)   Received from [facility]   Epic    Within the past 12 months, you worried that your food would run out before you got money to buy more: Never true    Within the past 12 months, the food you bought just didn't last and you didn't have money to get more. : Never true  Recent Concern: Food  Insecurity - Medium Risk (09/01/2023)   Received from [facility]   Epic    Within the past 12 months, you worried that your food would run out before you got money to buy more: Sometimes true    Within the past 12 months, the food you bought just didn't last and you didn't have money to get more. : Sometimes true  Transportation Needs: No Transportation Needs (09/01/2023)   Received from [facility]    In the past 12 months, has lack of reliable transportation kept you from medical appointments, meetings, work or from getting things needed for daily living? : No  Physical Activity: Not on file  Stress: Not on file  Social Connections: Not on file  Depression (PHQ2-9): Low Risk (12/09/2022)   Depression (PHQ2-9)    PHQ-2 Score: 0  Alcohol Screen: Not on file  Housing: Low Risk (11/14/2023)   Received from [facility]   Epic    What is your living situation today?: I have a steady place to live    Think about the place you live. Do you have problems with any of the following? Choose all that apply:: None/None on this list  Recent Concern: Housing - Medium Risk (09/01/2023)   Received from [facility]   Epic    What is your living situation today?: I have a place to live today, but I am worried about losing it in the future    Think about the place you live. Do you have problems with any of the following? Choose all that apply:: None/None on this list  Utilities: Medium Risk (09/01/2023)   Received from [facility]   Utilities    In the past 12 months has the electric, gas, oil, or water company threatened to shut off services in your home? : Yes  Health Literacy: Not on file    Allergies[1]  Current Medications[2]    Review of Systems     Objective:   Physical Exam        Assessment & Plan:       [1]  Allergies Allergen Reactions   Bactrim [Sulfamethoxazole-Trimethoprim]    Bee Venom Anaphylaxis   Sulfa Antibiotics Anaphylaxis   Truvada  [Emtricitabine -Tenofovir  Df] Anaphylaxis and Rash    Takes plain tenofovir  at home   Lidoderm  [Lidocaine ] Other (See Comments)    Reaction unknown   Ceftriaxone Rash   Sulfamethoxazole  Itching, Other (See Comments) and Rash    Other reaction(s): Hypotension (ALLERGY/intolerance)  [2]  Current Outpatient Medications:    ARIPiprazole (ABILIFY) 5 MG tablet, Take 1 tablet by mouth at bedtime., Disp: , Rfl:    colestipol  (COLESTID ) 1 g tablet, Take 1 tablet (1 g total) by mouth 2 (two) times daily., Disp: 30 tablet, Rfl: 0   darunavir  (PREZISTA ) 600 MG tablet, Take 1 tablet (600 mg total) by mouth 2 (two) times daily with a meal., Disp: 60 tablet, Rfl: 1   dolutegravir  (TIVICAY ) 50 MG tablet, Take 1 tablet (50 mg total) by mouth 2 (two) times daily., Disp: 60 tablet, Rfl: 11   EPINEPHrine  (EPI-PEN) 0.3 mg/0.3 mL DEVI, Inject 0.3 mLs (0.3 mg total) into the muscle once., Disp: 1 Device, Rfl: 1   escitalopram  (LEXAPRO ) 20 MG tablet, TAKE 1 TABLET BY MOUTH   DAILY (Patient taking differently: Take 20 mg by mouth daily.), Disp: 30 tablet, Rfl: 1   fostemsavir tromethamine  (RUKOBIA ) 600 MG TB12 ER tablet, Take 1 tablet by mouth every 12 (twelve) hours., Disp: 60 tablet, Rfl: 11   furosemide  (LASIX ) 20 MG tablet, Take 1 tablet (20 mg total) by mouth daily., Disp: 30 tablet, Rfl: 1   LORazepam  (ATIVAN ) 1 MG tablet, Take 1 mg by mouth at bedtime as needed for anxiety or sleep., Disp: , Rfl:    losartan  (COZAAR ) 25 MG tablet, Take 25 mg by mouth daily., Disp: , Rfl:    metoprolol  succinate (TOPROL -XL) 25 MG 24 hr tablet, Take 25 mg by mouth daily., Disp: , Rfl:    ondansetron  (ZOFRAN ) 8 MG tablet, Take 1 tablet (8 mg total) by mouth every 4 (four) hours as needed for nausea., Disp: 10 tablet, Rfl: 0   ondansetron  (ZOFRAN -ODT) 4 MG disintegrating tablet, Take 1 tablet (4 mg total) by mouth every 8 (eight) hours as needed for nausea or vomiting., Disp: 10 tablet, Rfl: 0   polyethylene glycol powder  (GLYCOLAX/MIRALAX) 17 GM/SCOOP powder, Take 17 g by mouth daily., Disp: , Rfl:    promethazine  (PHENERGAN ) 25 MG tablet, 1-2 tablets every six hours prn nausea not relieved by zofran , Disp: 100 tablet, Rfl: 3   ritonavir  (NORVIR ) 100 MG TABS tablet, Take 1 tablet (100 mg total) by mouth 2 (two) times daily with a meal., Disp: 60 tablet, Rfl: 1   rosuvastatin  (CRESTOR ) 20 MG tablet, Take 1 tablet (20 mg total) by mouth daily., Disp: 30 tablet, Rfl: 11   sucralfate (CARAFATE) 1 g tablet, Take 1 g by mouth 3 (three) times daily., Disp: , Rfl:    traZODone  (DESYREL ) 50 MG tablet, Take 50 mg by mouth at bedtime. , Disp: , Rfl:    valACYclovir  (VALTREX ) 1000 MG tablet, Take 1 tablet (1,000 mg total) by mouth daily., Disp: 30 tablet, Rfl: 11   warfarin (COUMADIN ) 1 MG tablet, Take 1 mg by mouth every Friday. Take as directed with 5mg  tablet per Coumadin  Clinic on Fridays., Disp: , Rfl:    warfarin (COUMADIN ) 4 MG tablet, Take by mouth See admin instructions. Takes 5MG  and 4MG  together (9MG  total) on Sundays and Tuesdays, Disp: , Rfl:    warfarin (COUMADIN ) 5 MG tablet, Take 5 mg by mouth See admin instructions. Takes 5 MG on Monday, Wednesday, Thursday, Friday and Saturday, then 9 MG on Sunday and Tuesday, Disp: , Rfl:  No current facility-administered medications for this visit.  Facility-Administered Medications Ordered in Other Visits:    0.9 %  sodium chloride  infusion, , Intravenous,  Once, Eben Reyes BROCKS, MD  "

## 2024-03-01 ENCOUNTER — Ambulatory Visit (INDEPENDENT_AMBULATORY_CARE_PROVIDER_SITE_OTHER): Admitting: Infectious Disease

## 2024-03-01 ENCOUNTER — Encounter: Payer: Self-pay | Admitting: Infectious Disease

## 2024-03-01 ENCOUNTER — Other Ambulatory Visit: Payer: Self-pay

## 2024-03-01 ENCOUNTER — Ambulatory Visit: Payer: Self-pay | Admitting: Infectious Disease

## 2024-03-01 VITALS — BP 153/87 | HR 86 | Temp 97.7°F | Ht 69.0 in | Wt 128.0 lb

## 2024-03-01 DIAGNOSIS — Z952 Presence of prosthetic heart valve: Secondary | ICD-10-CM

## 2024-03-01 DIAGNOSIS — R1115 Cyclical vomiting syndrome unrelated to migraine: Secondary | ICD-10-CM

## 2024-03-01 DIAGNOSIS — K861 Other chronic pancreatitis: Secondary | ICD-10-CM | POA: Diagnosis not present

## 2024-03-01 DIAGNOSIS — B20 Human immunodeficiency virus [HIV] disease: Secondary | ICD-10-CM

## 2024-03-01 DIAGNOSIS — R197 Diarrhea, unspecified: Secondary | ICD-10-CM

## 2024-03-02 LAB — T-HELPER CELLS (CD4) COUNT (NOT AT ARMC)
CD4 % Helper T Cell: 16 % — ABNORMAL LOW (ref 33–65)
CD4 T Cell Abs: 479 /uL (ref 400–1790)

## 2024-03-10 ENCOUNTER — Other Ambulatory Visit: Payer: Self-pay | Admitting: Pharmacist

## 2024-03-10 DIAGNOSIS — B20 Human immunodeficiency virus [HIV] disease: Secondary | ICD-10-CM

## 2024-03-16 NOTE — Telephone Encounter (Signed)
 Last dispensed 12/31 and patient has appointment 1/22. Refills to be provided at 1/22 appointment

## 2024-03-17 LAB — HIV RNA, RTPCR W/R GT (RTI, PI,INT)
HIV 1 RNA Quant: 4670 {copies}/mL — ABNORMAL HIGH
HIV-1 RNA Quant, Log: 3.67 {Log_copies}/mL — ABNORMAL HIGH

## 2024-03-17 LAB — HIV-1 INTEGRASE GENOTYPE

## 2024-03-17 LAB — CBC WITH DIFFERENTIAL/PLATELET
Absolute Lymphocytes: 3407 {cells}/uL (ref 850–3900)
Absolute Monocytes: 340 {cells}/uL (ref 200–950)
Basophils Absolute: 38 {cells}/uL (ref 0–200)
Basophils Relative: 0.7 %
Eosinophils Absolute: 59 {cells}/uL (ref 15–500)
Eosinophils Relative: 1.1 %
HCT: 34.2 % — ABNORMAL LOW (ref 39.4–51.1)
Hemoglobin: 11.2 g/dL — ABNORMAL LOW (ref 13.2–17.1)
MCH: 33.1 pg — ABNORMAL HIGH (ref 27.0–33.0)
MCHC: 32.7 g/dL (ref 31.6–35.4)
MCV: 101.2 fL (ref 81.4–101.7)
MPV: 11.6 fL (ref 7.5–12.5)
Monocytes Relative: 6.3 %
Neutro Abs: 1555 {cells}/uL (ref 1500–7800)
Neutrophils Relative %: 28.8 %
Platelets: 171 Thousand/uL (ref 140–400)
RBC: 3.38 Million/uL — ABNORMAL LOW (ref 4.20–5.80)
RDW: 12.6 % (ref 11.0–15.0)
Total Lymphocyte: 63.1 %
WBC: 5.4 Thousand/uL (ref 3.8–10.8)

## 2024-03-17 LAB — COMPLETE METABOLIC PANEL WITHOUT GFR
AG Ratio: 1.1 (calc) (ref 1.0–2.5)
ALT: 23 U/L (ref 9–46)
AST: 22 U/L (ref 10–35)
Albumin: 3.5 g/dL — ABNORMAL LOW (ref 3.6–5.1)
Alkaline phosphatase (APISO): 117 U/L (ref 35–144)
BUN/Creatinine Ratio: 11 (calc) (ref 6–22)
BUN: 17 mg/dL (ref 7–25)
CO2: 27 mmol/L (ref 20–32)
Calcium: 8.4 mg/dL — ABNORMAL LOW (ref 8.6–10.3)
Chloride: 107 mmol/L (ref 98–110)
Creat: 1.49 mg/dL — ABNORMAL HIGH (ref 0.70–1.30)
Globulin: 3.2 g/dL (ref 1.9–3.7)
Glucose, Bld: 97 mg/dL (ref 65–99)
Potassium: 3.8 mmol/L (ref 3.5–5.3)
Sodium: 140 mmol/L (ref 135–146)
Total Bilirubin: 0.7 mg/dL (ref 0.2–1.2)
Total Protein: 6.7 g/dL (ref 6.1–8.1)

## 2024-03-17 LAB — HIV-1 GENOTYPE: HIV-1 Genotype: DETECTED — AB

## 2024-03-31 NOTE — Progress Notes (Unsigned)
 "  HPI: Brandon [name] is a 59 y.o. male who presents to the [facility] pharmacy clinic for HIV follow-up.  Referring ID Provider: Dr. 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   Protein-calorie malnutrition, severe 08/24/2013   Diarrhea 08/23/2013   Gastroenteritis 08/05/2013   Depression 03/17/2013   Sinus congestion 03/17/2013   Odynophagia 07/24/2012   Persistent vomiting 07/24/2012   Candida esophagitis (HCC) 07/23/2012   Intractable nausea and vomiting 07/23/2012   Adjustment disorder with mixed anxiety and depressed mood 05/14/2012   Genital herpes 05/14/2012   Long term (current) use of anticoagulants 05/14/2012   cerebral artey occ 05/14/2012   Cerebral artery occlusion with cerebral infarction (HCC) 05/14/2012   Chronic pancreatitis (HCC) 02/20/2012   Hypokalemia 02/20/2012   Acute pancreatitis 02/09/2012   Nausea 02/09/2012   Abdominal pain 02/09/2012   Anemia 02/09/2012   S/P AVR (aortic valve replacement) 02/09/2012   Septic arthritis of knee (HCC) 02/09/2012   Warfarin-induced coagulopathy 02/09/2012   AIDS (HCC) 02/09/2012    Patient's Medications  New Prescriptions   No medications on file  Previous Medications   ARIPIPRAZOLE (ABILIFY) 5 MG TABLET    Take 1 tablet by mouth at bedtime.   COLESTIPOL  (COLESTID ) 1 G TABLET    Take 1 tablet (1 g total) by mouth 2 (two) times daily.    DARUNAVIR  (PREZISTA ) 600 MG TABLET    Take 1 tablet (600 mg total) by mouth 2 (two) times daily with a meal.   DOLUTEGRAVIR  (TIVICAY ) 50 MG TABLET    Take 1 tablet (50 mg total) by mouth 2 (two) times daily.   EPINEPHRINE  (EPI-PEN) 0.3 MG/0.3 ML DEVI    Inject 0.3 mLs (0.3 mg total) into the muscle once.   ESCITALOPRAM  (LEXAPRO ) 20 MG TABLET    TAKE 1 TABLET BY MOUTH   DAILY   FOSTEMSAVIR TROMETHAMINE  (RUKOBIA ) 600 MG TB12 ER TABLET    Take 1 tablet by mouth every 12 (twelve) hours.   FUROSEMIDE  (LASIX ) 20 MG TABLET    Take 1 tablet (20 mg total) by mouth daily.   LORAZEPAM  (ATIVAN ) 1 MG TABLET    Take 1 mg by mouth at bedtime as needed for anxiety or sleep.   LOSARTAN  (COZAAR ) 25 MG TABLET    Take 25 mg by mouth daily.   METOPROLOL  SUCCINATE (TOPROL -XL) 25 MG 24 HR TABLET    Take 25 mg by mouth daily.   ONDANSETRON  (ZOFRAN ) 8 MG TABLET    Take 1 tablet (8 mg total) by mouth every 4 (four) hours as needed for nausea.   ONDANSETRON  (ZOFRAN -ODT) 4 MG DISINTEGRATING TABLET    Take 1 tablet (4 mg total) by mouth every 8 (eight) hours as needed for nausea or vomiting.   POLYETHYLENE GLYCOL POWDER (GLYCOLAX/MIRALAX) 17 GM/SCOOP POWDER    Take 17 g by mouth daily.   PROMETHAZINE  (PHENERGAN ) 25 MG TABLET    1-2 tablets every six  hours prn nausea not relieved by zofran    RITONAVIR  (NORVIR ) 100 MG TABS TABLET    Take 1 tablet (100 mg total) by mouth 2 (two) times daily with a meal.   ROSUVASTATIN  (CRESTOR ) 20 MG TABLET    Take 1 tablet (20 mg total) by mouth daily.   SUCRALFATE (CARAFATE) 1 G TABLET    Take 1 g by mouth 3 (three) times daily.   TRAZODONE  (DESYREL ) 50 MG TABLET    Take 50 mg by mouth at bedtime.    VALACYCLOVIR  (VALTREX ) 1000 MG TABLET    Take 1 tablet (1,000 mg total) by mouth daily.   WARFARIN (COUMADIN ) 1 MG TABLET    Take 1 mg by mouth every Friday. Take as directed with 5mg  tablet per Coumadin  Clinic on Fridays.   WARFARIN (COUMADIN ) 4 MG TABLET    Take by mouth See admin instructions.  Takes 5MG  and 4MG  together (9MG  total) on Sundays and Tuesdays   WARFARIN (COUMADIN ) 5 MG TABLET    Take 5 mg by mouth See admin instructions. Takes 5 MG on Monday, Wednesday, Thursday, Friday and Saturday, then 9 MG on Sunday and Tuesday  Modified Medications   No medications on file  Discontinued Medications   No medications on file    Labs: Lab Results  Component Value Date   HIV1RNAQUANT 4,670 (H) 03/01/2024   HIV1RNAQUANT 84,700 (H) 02/10/2024   HIV1RNAQUANT 79 (H) 01/15/2024   CD4TABS 479 03/01/2024   CD4TABS 298 (L) 12/15/2023   CD4TABS 434 05/12/2023    RPR and STI Lab Results  Component Value Date   LABRPR NON-REACTIVE 12/15/2023   LABRPR NON-REACTIVE 05/12/2023   LABRPR NON-REACTIVE 12/09/2022   LABRPR NON-REACTIVE 06/05/2022   LABRPR NON-REACTIVE 04/01/2022   RPRTITER 1:1 (H) 11/28/2017    STI Results GC CT  12/15/2023  3:41 PM Negative  Negative   12/09/2022  3:41 PM Negative  Negative   06/05/2022  4:13 PM Negative  Negative   04/01/2022  3:34 PM Negative  Negative   12/31/2021  3:59 PM Negative  Negative   12/01/2019  4:21 PM Negative  Negative   05/14/2018 12:00 AM Negative  Negative   02/13/2016 12:00 AM Negative  Negative   03/31/2014 12:00 AM NG: Negative  CT: Negative   01/26/2014 12:00 AM NG: Negative  CT: Negative     Hepatitis B Lab Results  Component Value Date   HEPBSAB NONREACTIVE 05/14/2012   HEPBSAG NEGATIVE 05/14/2012   HEPBCAB NEG 05/14/2012   Hepatitis C No results found for: HEPCAB, HCVRNAPCRQN Hepatitis A Lab Results  Component Value Date   HAV POS (A) 05/14/2012   Lipids: Lab Results  Component Value Date   CHOL 143 12/15/2023   TRIG 126 12/15/2023   HDL 40 12/15/2023   CHOLHDL 3.6 12/15/2023   VLDL 17 07/18/2014   LDLCALC 80 12/15/2023    Current HIV Regimen: Prezista /norvir  (BID dosing), Rukobia , and Tivicay  (BID dosing)  Assessment: Brandon [name] is here for follow up of HIV care. Previous genotypes with  extensive drug resistance as detailed in telephone encounter on 01/15/24. He has also had issues with chronic nausea/vomiting in the setting of esophageal dysphagia and gastroparesis which have complicated adherence to ART regimen. Darunavir  last filled on 12/2 and 12/31, Rukobia  on 12/4 and 12/31, ritonavir  on 12/2 and 12/31, and dolutegravir  on 12/2 and 12/31. HIV RNA has been variable but most recently decreased from 84,700 to 4,670 on 12/22. CD4 count was 479.   Brandon [name] says he has  been doing well with his HIV medications. He says he's missed maybe 1-2 doses in the last month. He is still experiencing a lot of nausea, but he doesn't think it's related to the HIV medicines. Taking his morning doses with breakfast has helped. Denies any barriers to accessing the medicines or taking as prescribed.   I mentioned the Sunlenca as a potential option to be part of his HIV treatment since it was more convenient and has potential for decreasing pill burden. He says he has discussed it with Dr. Fleeta Rothman and is not interested. When I asked about his concerns related to Sunlenca, he said he is worried about significant bruising due to him needing to be on warfarin (with higher goal INR 2.5-3.5 for mechanical aortic valve replacement). He said he has had injections of medicine in his belly before that caused significant bruising with the warfarin. He is okay with taking all the oral pills he currently takes and does not want to take injections if at all possible.   Labs:  HIV RNA today  Eligible vaccinations:  Offered shingles, Tdap, and Hep B vaccines; he said he would think about them and maybe discuss at next visit  Plan: - Continue current regimen - Check HIV RNA today - Follow up on 3/11 with Dr. Fleeta Rothman Maurilio Wilhemena, PharmD PGY1 Pharmacy Resident [facility] 04/01/2024  "

## 2024-04-01 ENCOUNTER — Ambulatory Visit: Payer: Self-pay | Admitting: Pharmacist

## 2024-04-01 ENCOUNTER — Other Ambulatory Visit: Payer: Self-pay

## 2024-04-01 DIAGNOSIS — B20 Human immunodeficiency virus [HIV] disease: Secondary | ICD-10-CM

## 2024-04-01 NOTE — Progress Notes (Signed)
 Brandon [name]

## 2024-04-03 LAB — HIV-1 RNA QUANT-NO REFLEX-BLD
HIV 1 RNA Quant: 389 {copies}/mL — ABNORMAL HIGH
HIV-1 RNA Quant, Log: 2.59 {Log_copies}/mL — ABNORMAL HIGH

## 2024-04-07 ENCOUNTER — Other Ambulatory Visit: Payer: Self-pay | Admitting: Pharmacist

## 2024-04-07 DIAGNOSIS — B20 Human immunodeficiency virus [HIV] disease: Secondary | ICD-10-CM

## 2024-05-19 ENCOUNTER — Ambulatory Visit: Payer: Self-pay | Admitting: Infectious Disease
